# Patient Record
Sex: Male | Born: 1940
Health system: Southern US, Community
[De-identification: ages and names within clinical notes are randomized; demographics above are authoritative.]

## PROBLEM LIST (undated history)

## (undated) DIAGNOSIS — R350 Frequency of micturition: Secondary | ICD-10-CM

## (undated) DIAGNOSIS — H548 Legal blindness, as defined in USA: Secondary | ICD-10-CM

## (undated) DIAGNOSIS — M1990 Unspecified osteoarthritis, unspecified site: Secondary | ICD-10-CM

## (undated) DIAGNOSIS — Z860100 Personal history of colon polyps, unspecified: Secondary | ICD-10-CM

## (undated) DIAGNOSIS — M2550 Pain in unspecified joint: Secondary | ICD-10-CM

## (undated) DIAGNOSIS — M545 Low back pain, unspecified: Secondary | ICD-10-CM

## (undated) DIAGNOSIS — K219 Gastro-esophageal reflux disease without esophagitis: Secondary | ICD-10-CM

## (undated) DIAGNOSIS — J189 Pneumonia, unspecified organism: Secondary | ICD-10-CM

## (undated) DIAGNOSIS — G9001 Carotid sinus syncope: Secondary | ICD-10-CM

## (undated) DIAGNOSIS — N400 Enlarged prostate without lower urinary tract symptoms: Secondary | ICD-10-CM

## (undated) DIAGNOSIS — J45909 Unspecified asthma, uncomplicated: Secondary | ICD-10-CM

## (undated) DIAGNOSIS — K648 Other hemorrhoids: Secondary | ICD-10-CM

## (undated) DIAGNOSIS — R51 Headache: Secondary | ICD-10-CM

## (undated) DIAGNOSIS — I509 Heart failure, unspecified: Secondary | ICD-10-CM

## (undated) DIAGNOSIS — K649 Unspecified hemorrhoids: Secondary | ICD-10-CM

## (undated) DIAGNOSIS — E119 Type 2 diabetes mellitus without complications: Secondary | ICD-10-CM

## (undated) DIAGNOSIS — E785 Hyperlipidemia, unspecified: Secondary | ICD-10-CM

## (undated) DIAGNOSIS — Z8709 Personal history of other diseases of the respiratory system: Secondary | ICD-10-CM

## (undated) DIAGNOSIS — I10 Essential (primary) hypertension: Secondary | ICD-10-CM

## (undated) DIAGNOSIS — I2510 Atherosclerotic heart disease of native coronary artery without angina pectoris: Secondary | ICD-10-CM

## (undated) DIAGNOSIS — I6529 Occlusion and stenosis of unspecified carotid artery: Secondary | ICD-10-CM

## (undated) DIAGNOSIS — H269 Unspecified cataract: Secondary | ICD-10-CM

## (undated) HISTORY — DX: Hyperlipidemia, unspecified: E78.5

## (undated) HISTORY — DX: Occlusion and stenosis of unspecified carotid artery: I65.29

## (undated) HISTORY — PX: HERNIA REPAIR: SHX51

## (undated) HISTORY — PX: COLONOSCOPY: SHX174

## (undated) HISTORY — PX: OTHER SURGICAL HISTORY: SHX169

## (undated) HISTORY — DX: Benign prostatic hyperplasia without lower urinary tract symptoms: N40.0

## (undated) HISTORY — PX: EYE SURGERY: SHX253

## (undated) HISTORY — DX: Carotid sinus syncope: G90.01

## (undated) HISTORY — DX: Unspecified osteoarthritis, unspecified site: M19.90

## (undated) HISTORY — DX: Essential (primary) hypertension: I10

## (undated) HISTORY — DX: Legal blindness, as defined in USA: H54.8

## (undated) HISTORY — PX: RETINAL DETACHMENT SURGERY: SHX105

## (undated) HISTORY — DX: Gastro-esophageal reflux disease without esophagitis: K21.9

---

## 2002-04-29 ENCOUNTER — Ambulatory Visit (HOSPITAL_BASED_OUTPATIENT_CLINIC_OR_DEPARTMENT_OTHER): Admission: RE | Admit: 2002-04-29 | Discharge: 2002-04-29 | Payer: Self-pay | Admitting: Surgery

## 2006-02-14 ENCOUNTER — Encounter: Admission: RE | Admit: 2006-02-14 | Discharge: 2006-02-14 | Payer: Self-pay | Admitting: *Deleted

## 2006-02-14 IMAGING — CT CT HEAD WO/W CM
1 of 2 series · 13 of 30 positions shown, 17 images · IV contrast (omnipaque)
Comparison: None.

CLINICAL DATA: Bilateral hand numbness, possible TIA.
HEAD CT WITHOUT AND WITH CONTRAST:
TECHNIQUE: Contiguous axial images were obtained from the base of the skull through the vertex according to standard protocol before and after administration of intravenous contrast.
Contrast:  75 cc of Omnipaque 300 IV.

[Series 2: head · axial · 0.49mm/px · z∈[+31,+161]mm · 13 of 28 slices shown, 17 images]
[im 2/28  brain]
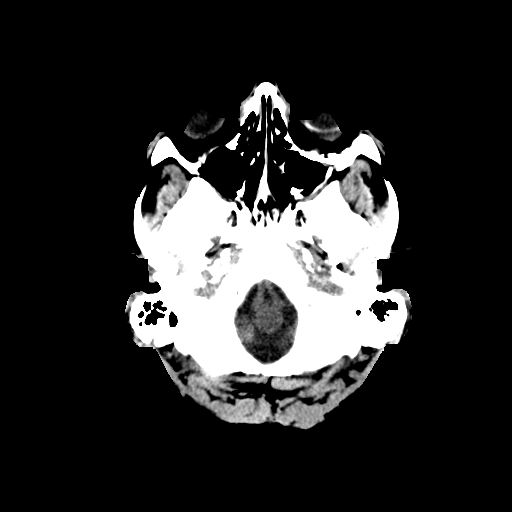
[im 2/28  bone]
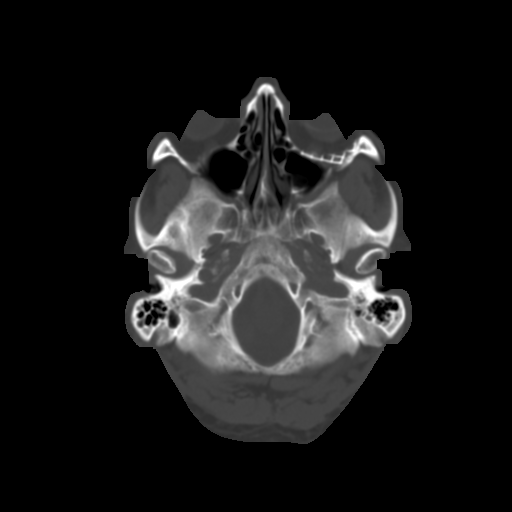
[im 4/28  brain]
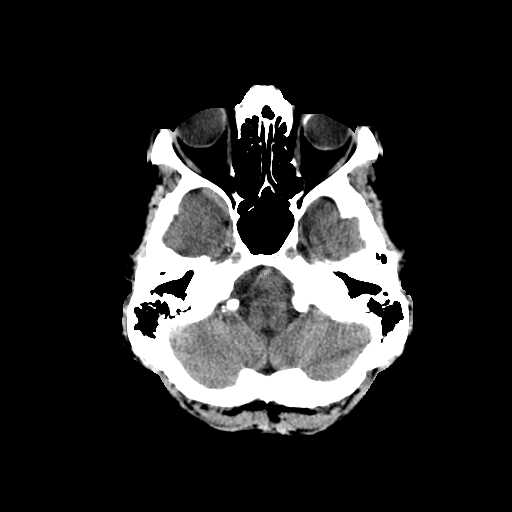
[im 6/28  brain]
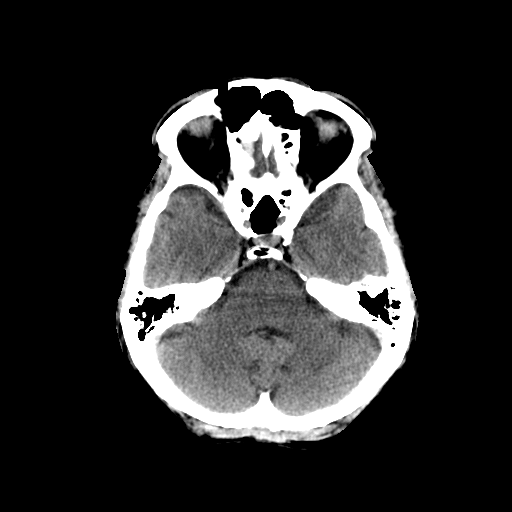
[im 8/28  brain]
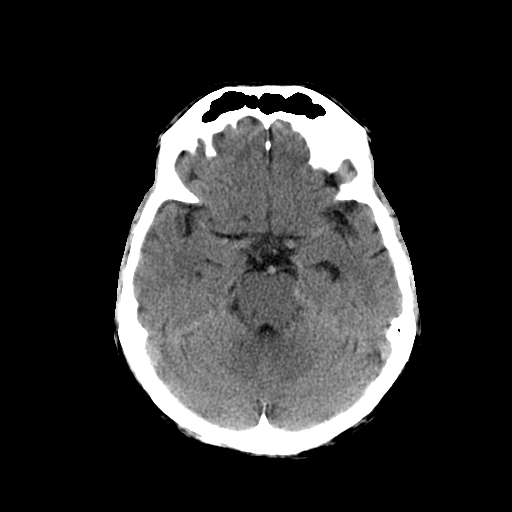
[im 10/28  brain]
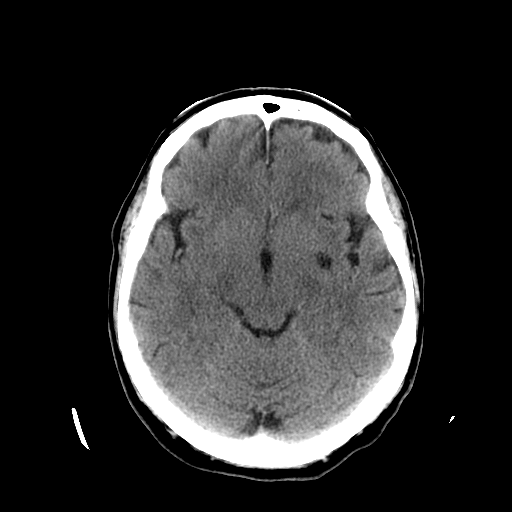
[im 10/28  bone]
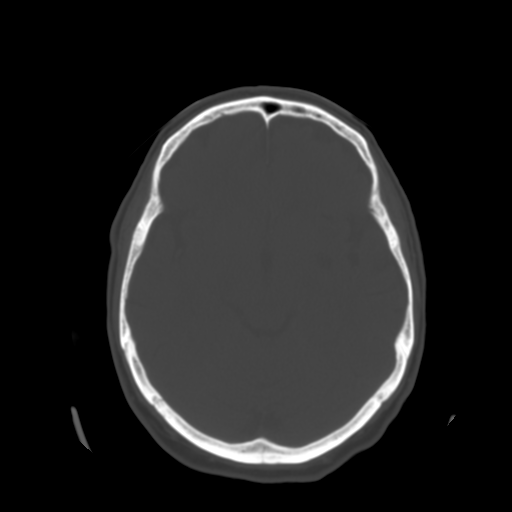
[im 12/28  brain]
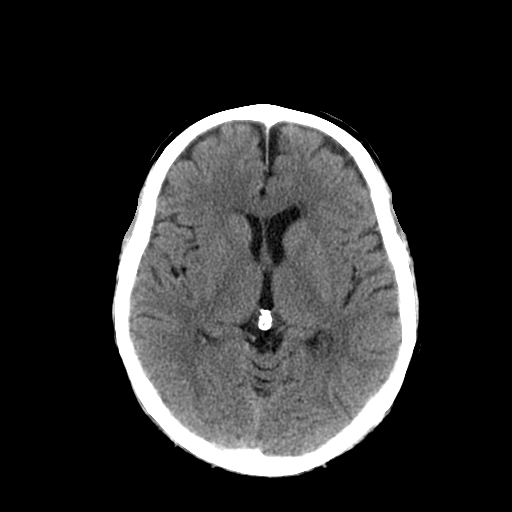
[im 14/28  brain]
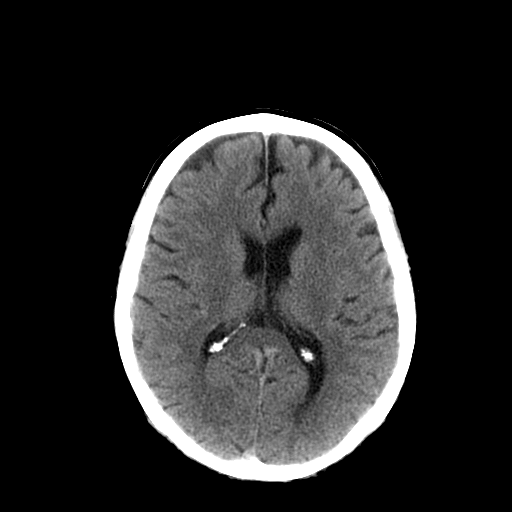
[im 16/28  brain]
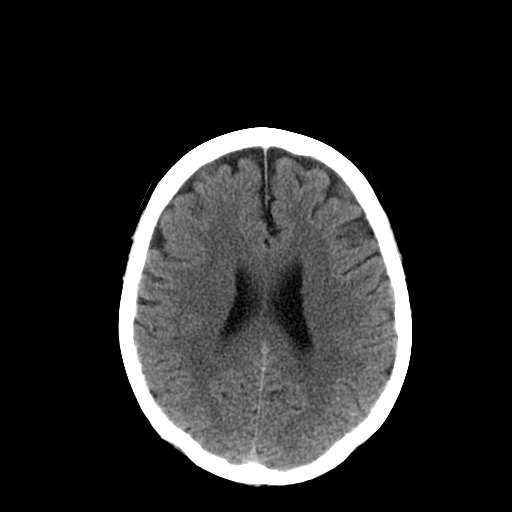
[im 18/28  brain]
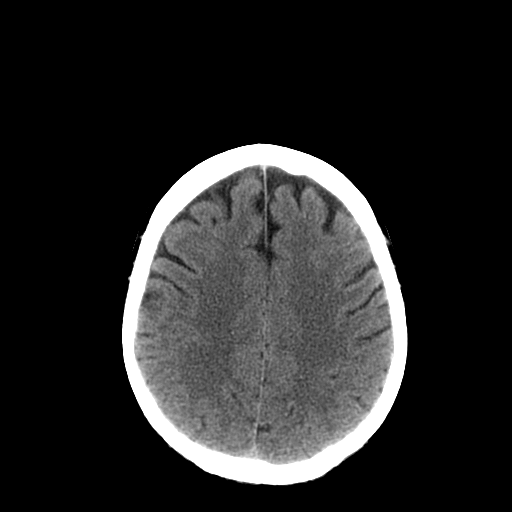
[im 18/28  bone]
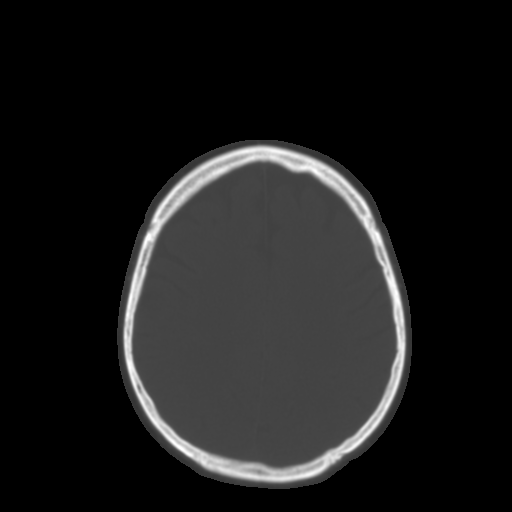
[im 20/28  brain]
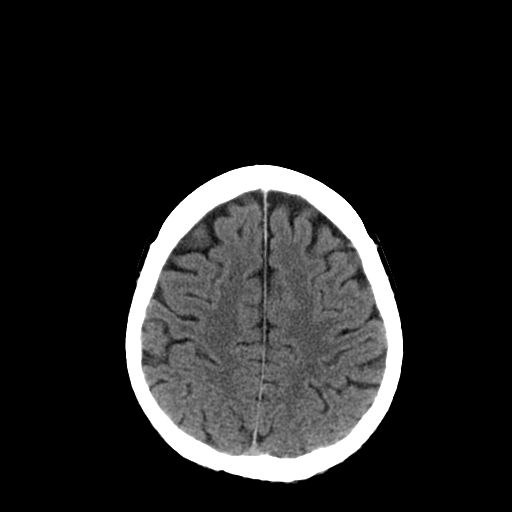
[im 22/28  brain]
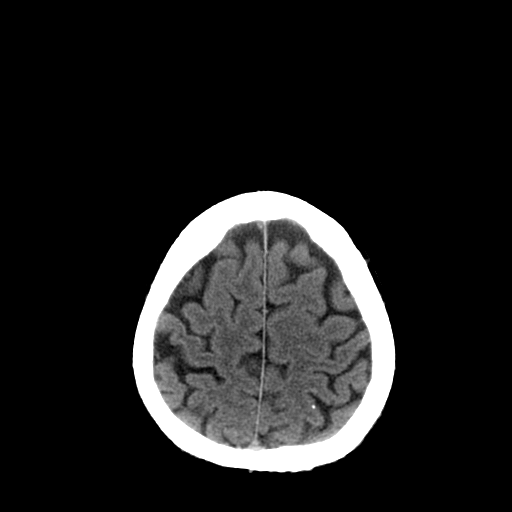
[im 24/28  brain]
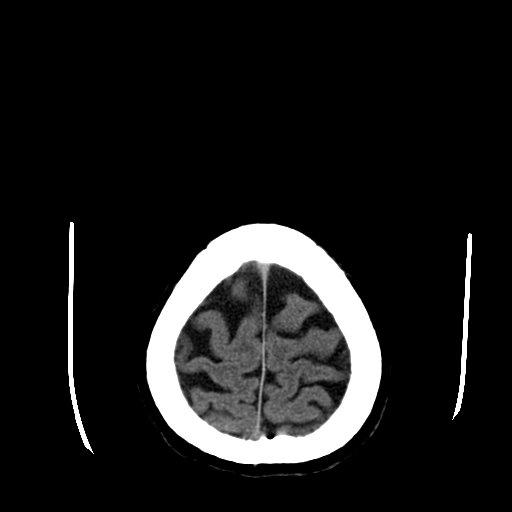
[im 26/28  brain]
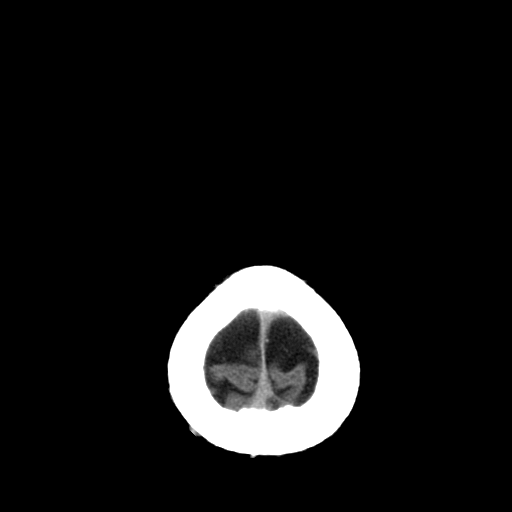
[im 26/28  bone]
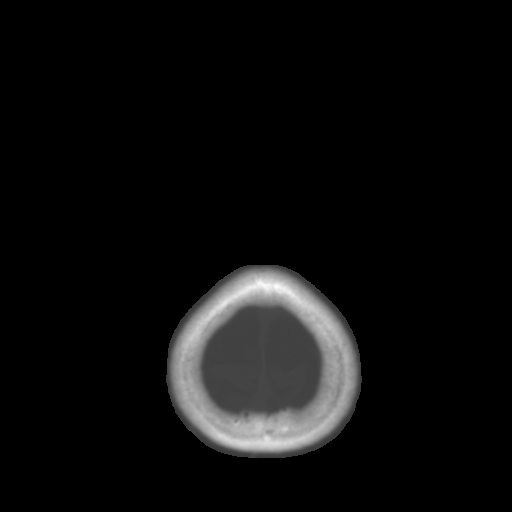

[13 of 30 positions shown; findings below may reference images not displayed]

FINDINGS: Mild age-appropriate atrophy (65 years old).  No significant white matter disease.  Prominent perivascular space in left medial temporal lobe.  No areas of acute infarction, hemorrhage, or mass effect.  Post infusion, no abnormal enhancement.  No hydrocephalus or extraaxial fluid collection.  No evidence for differential vascular enhancement.  Bone windows show paranasal sinuses and mastoids to be clear.  There has been a previous surgical procedure in the floor of the left orbit along with a left globe banding procedure.  Mild vascular calcification can be seen in the vertebral and carotid arteries.
IMPRESSION: No acute intracranial findings.

## 2006-02-14 IMAGING — US US CAROTID DUPLEX BILAT
1 series · 14 of 24 positions shown · non-contrast
Comparison: none

CLINICAL DATA: Bilateral hand numbness.  
 BILATERAL CAROTID DUPLEX ULTRASOUND:
 There is no previous for comparison.  There is partially calcified plaque effacing both carotid bulbs and extending into the proximal internal carotid arteries.  Appropriate wave forms throughout the extracranial carotid circulation.  Elevated peak systolic velocities in the proximal left external carotid artery.  Antegrade flow in both vertebral arteries.  
 The following Doppler flow velocity measurements were obtained (in cm/sec):
 SITE:  PEAK SYSTOLIC  END DIASTOLIC
 RIGHT ICA:   106  29
 RIGHT CCA:  102  16
 RIGHT ICA/CCA RATIO:
 RIGHT ECA:  118
 LEFT ICA:  147  30
 LEFT CCA:  120  22
 LEFT ICA/CCA RATIO:
 LEFT ECA:  211
 Criteria:  Quantification of carotid stenosis is based on velocity parameters that correlate the residual internal carotid diameter with NASCET ? based stenosis levels.

[Series 1: unknown · 0.07mm/px · 14 of 86 slices shown]
[im 1/86]
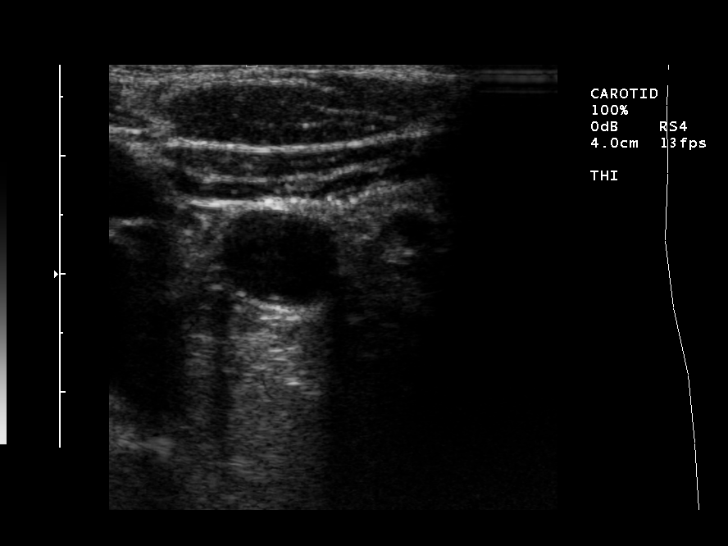
[im 8/86]
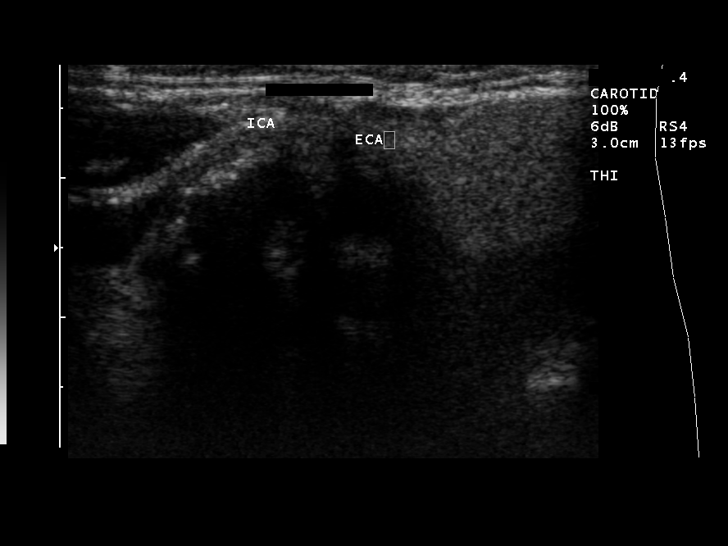
[im 15/86]
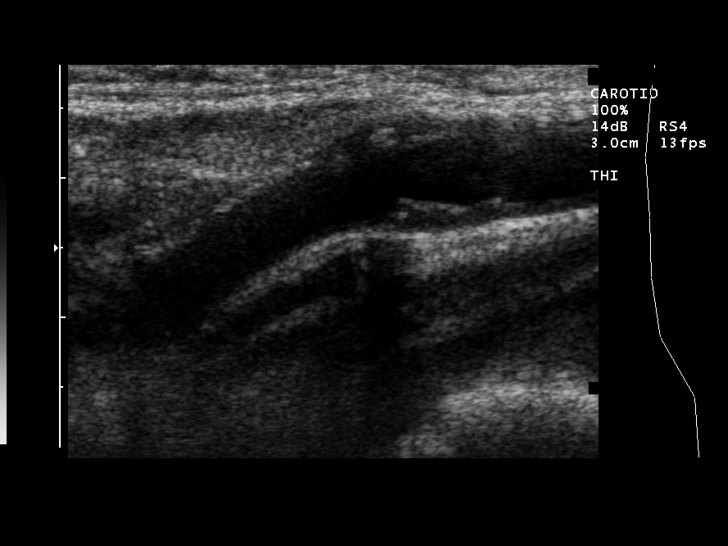
[im 23/86]
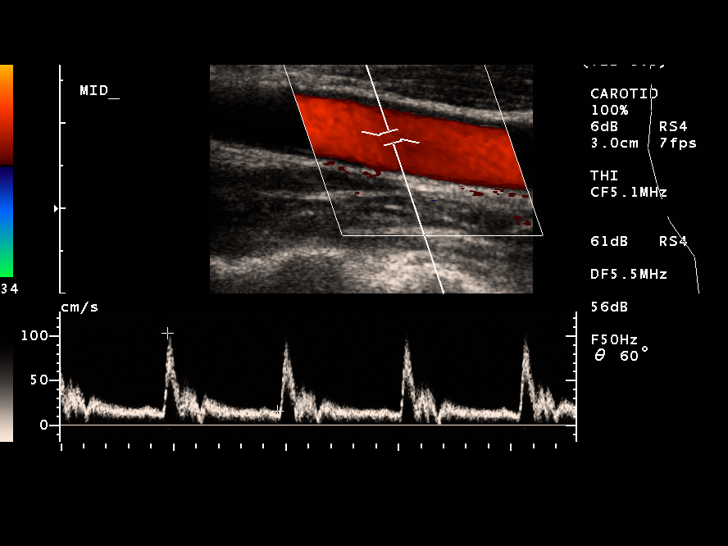
[im 26/86]
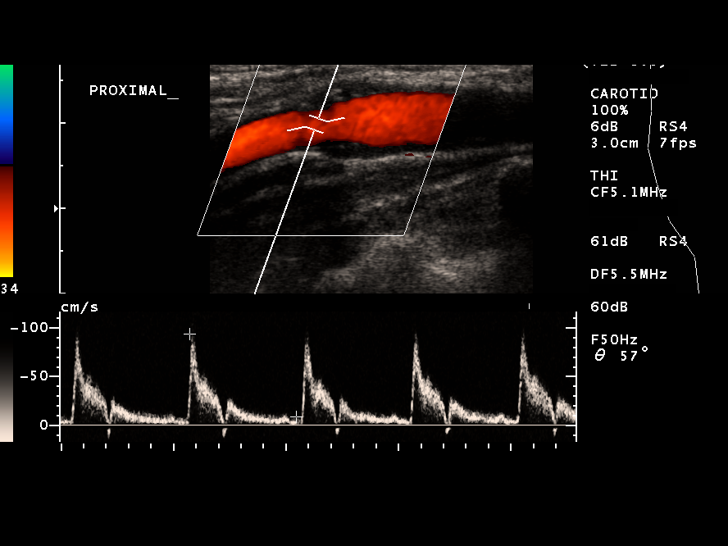
[im 34/86]
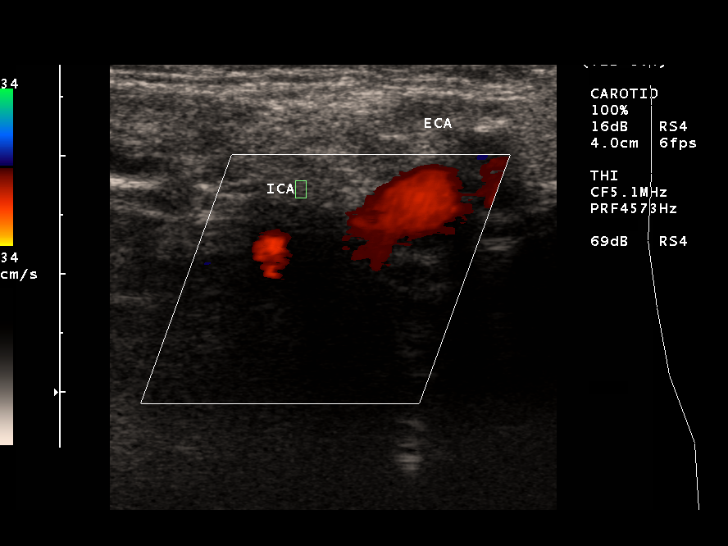
[im 41/86]
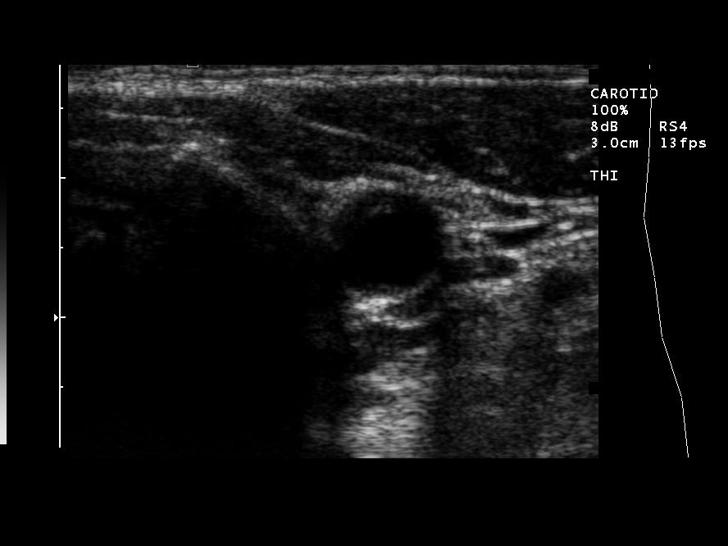
[im 45/86]
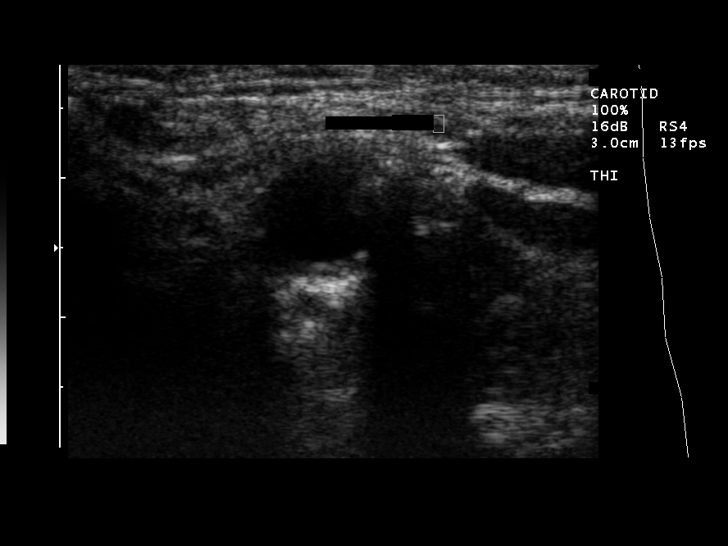
[im 52/86]
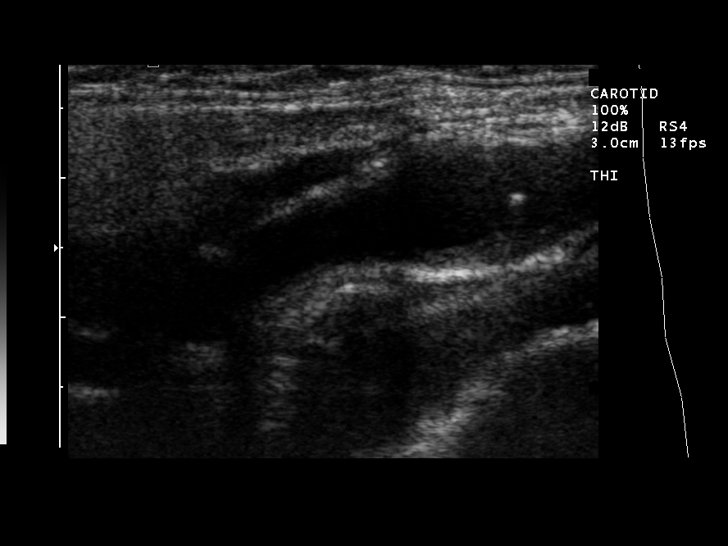
[im 60/86]
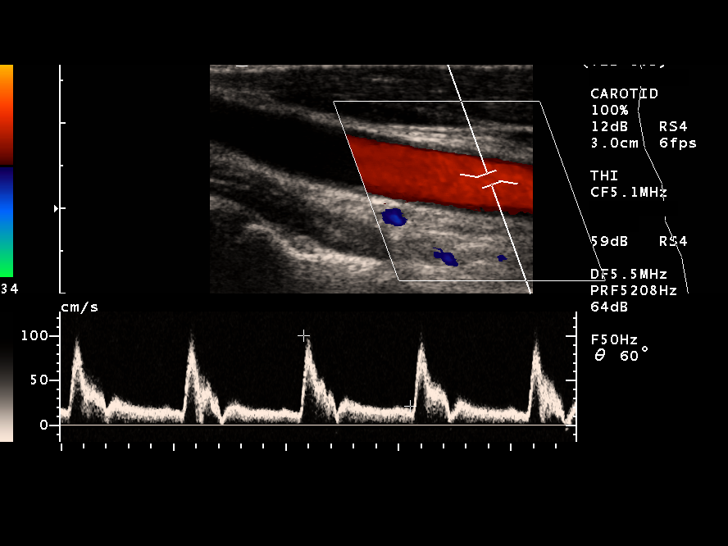
[im 67/86]
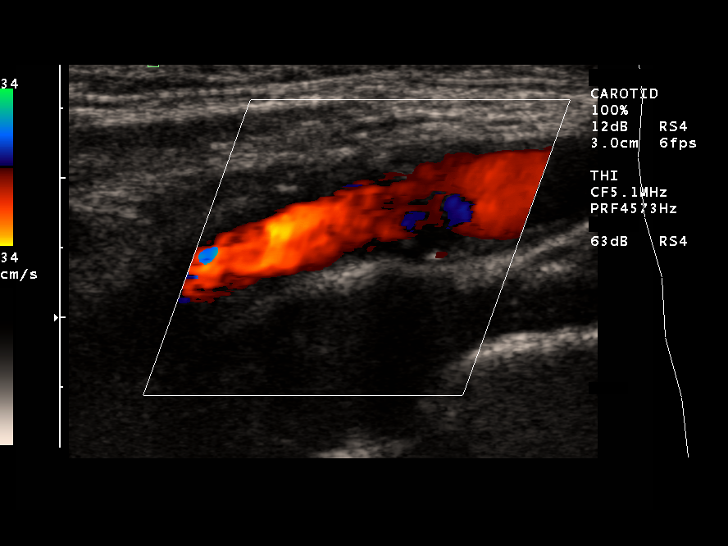
[im 71/86]
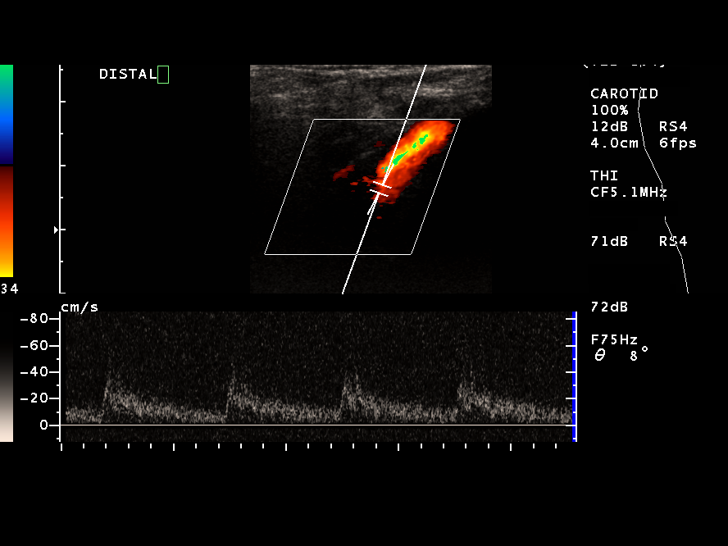
[im 78/86]
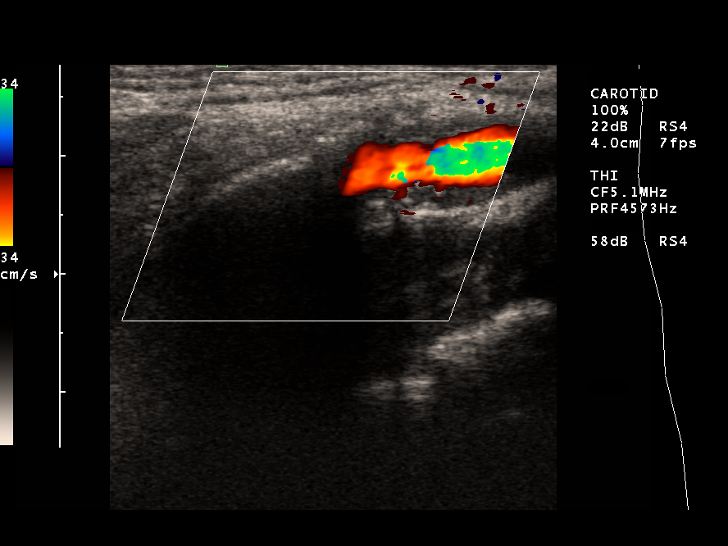
[im 86/86]
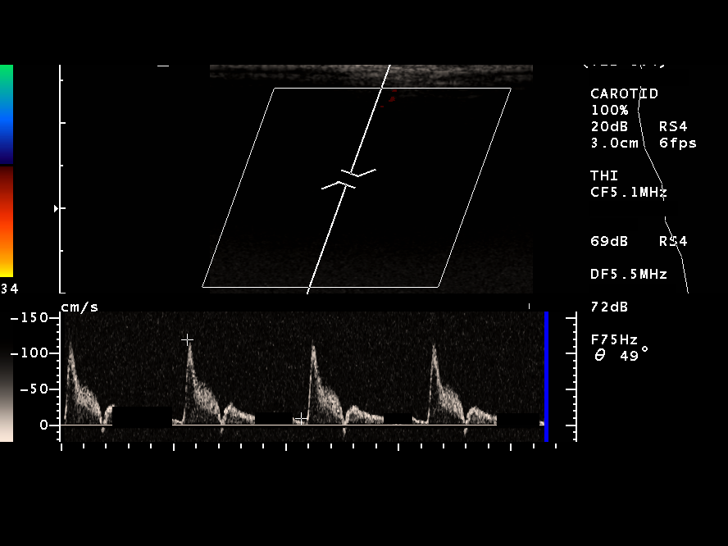

[14 of 24 positions shown; findings below may reference images not displayed]

IMPRESSION: 1.  Bilateral heavy carotid bifurcation and proximal ICA plaque with less than 50% diameter stenosis.  The exam does not exclude plaque ulceration or embolization.  Continued surveillance recommended.

## 2013-03-19 ENCOUNTER — Other Ambulatory Visit: Payer: Self-pay | Admitting: Family Medicine

## 2013-03-19 DIAGNOSIS — G9001 Carotid sinus syncope: Secondary | ICD-10-CM

## 2013-03-22 ENCOUNTER — Ambulatory Visit
Admission: RE | Admit: 2013-03-22 | Discharge: 2013-03-22 | Disposition: A | Discharge status: Home / Self Care | Payer: Medicare Other | Source: Ambulatory Visit | Attending: Family Medicine | Admitting: Family Medicine

## 2013-03-22 DIAGNOSIS — G9001 Carotid sinus syncope: Secondary | ICD-10-CM

## 2013-03-22 IMAGING — US US CAROTID DUPLEX BILAT
1 series · 13 of 24 positions shown · non-contrast
Comparison: US DUPLEX EXTRACRANIAL ARTERY BILAT dated [DATE]; CT
HEAD WO/W CM dated [DATE]

CLINICAL DATA: Carotidynia, hypertension, hyperlipidemia

EXAM:
BILATERAL CAROTID DUPLEX ULTRASOUND
TECHNIQUE: Gray scale imaging, color Doppler and duplex ultrasound were
performed of bilateral carotid and vertebral arteries in the neck.

[Series 1: us carotid duplex bilat · 0.08mm/px · 13 of 74 slices shown]
[im 1/74]
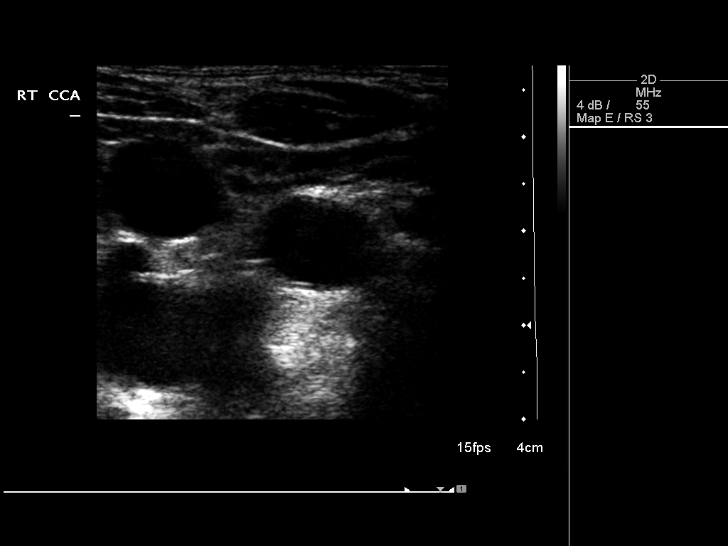
[im 7/74]
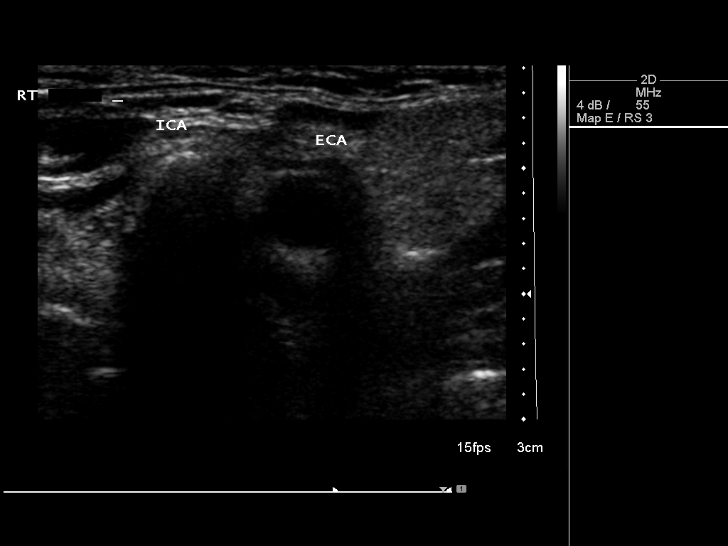
[im 13/74]
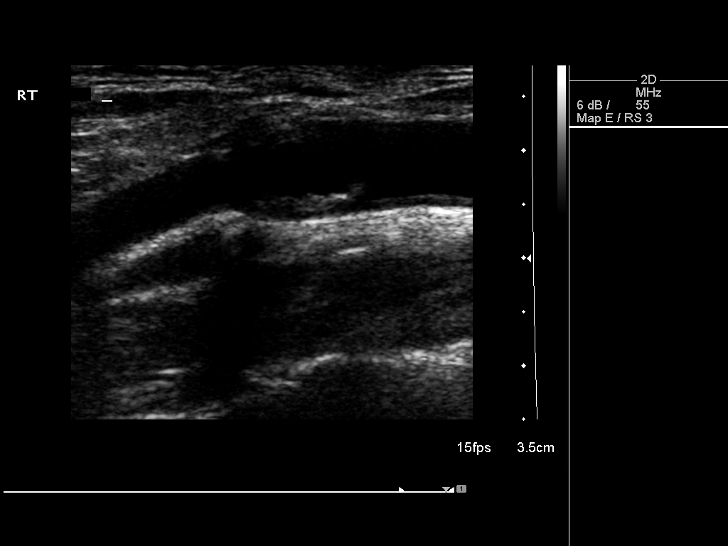
[im 20/74]
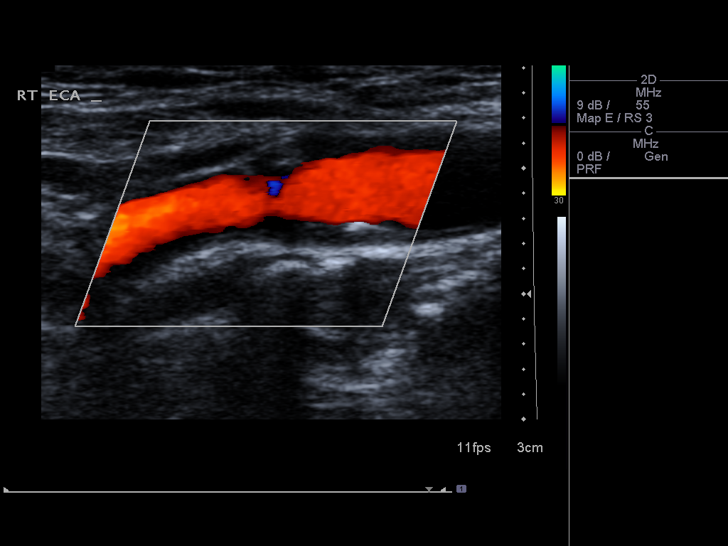
[im 26/74]
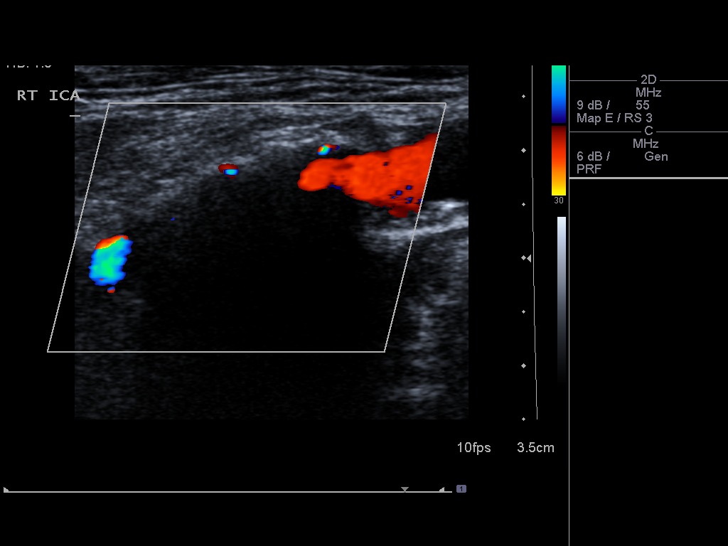
[im 32/74]
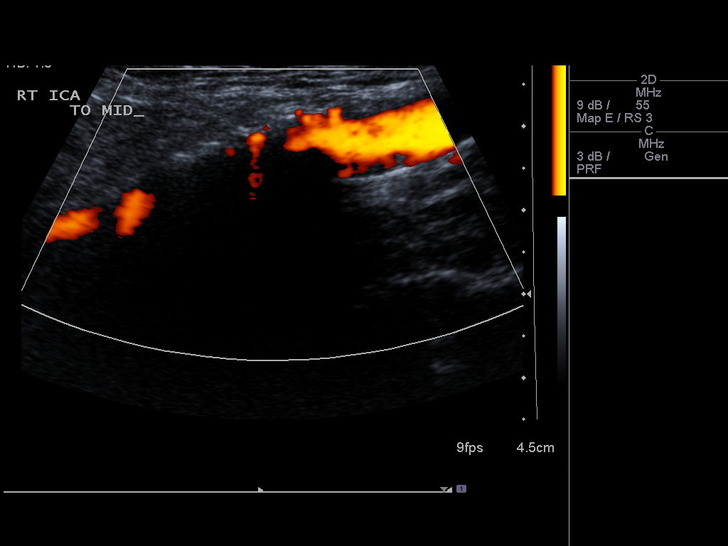
[im 39/74]
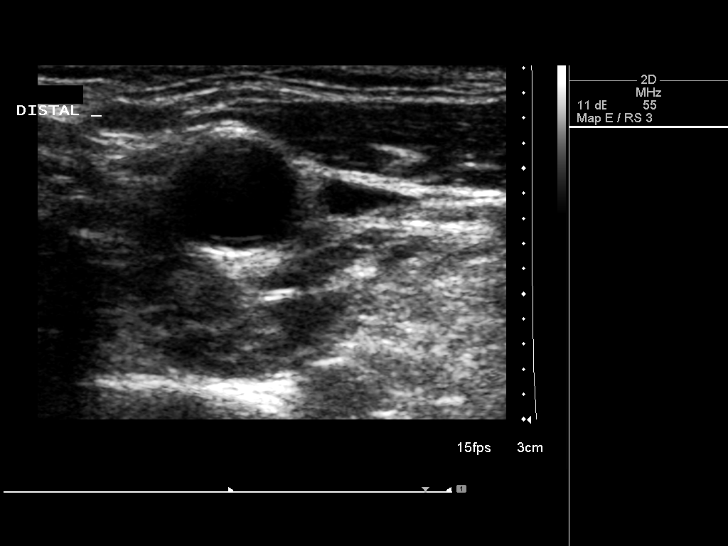
[im 42/74]
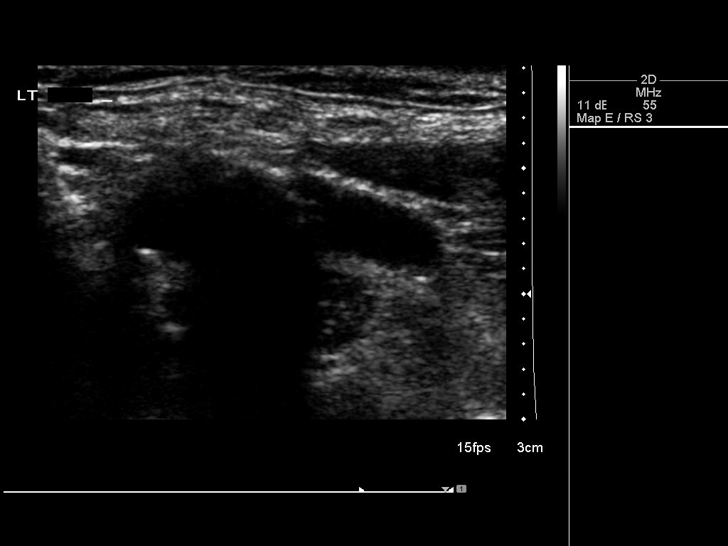
[im 48/74]
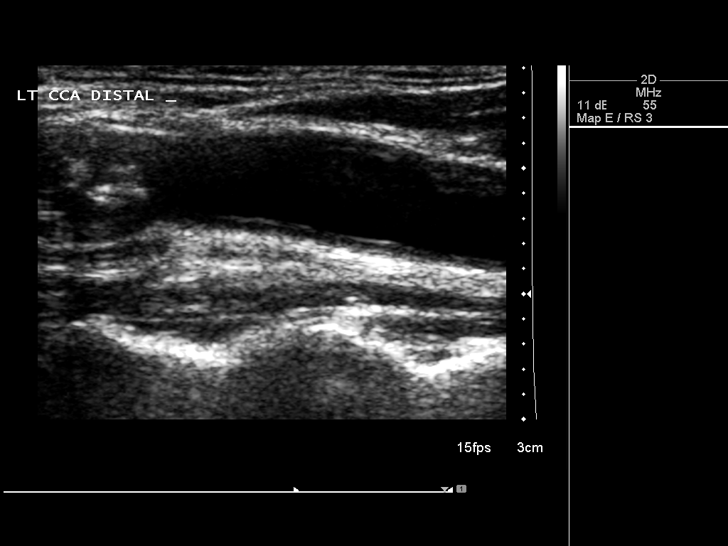
[im 54/74]
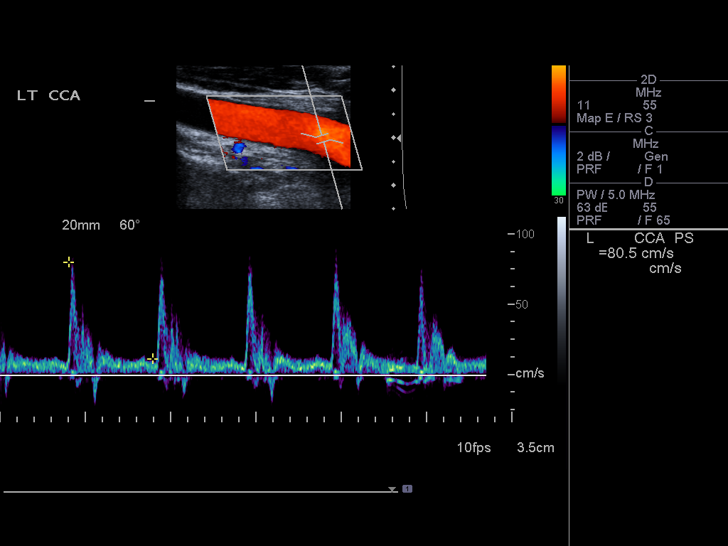
[im 61/74]
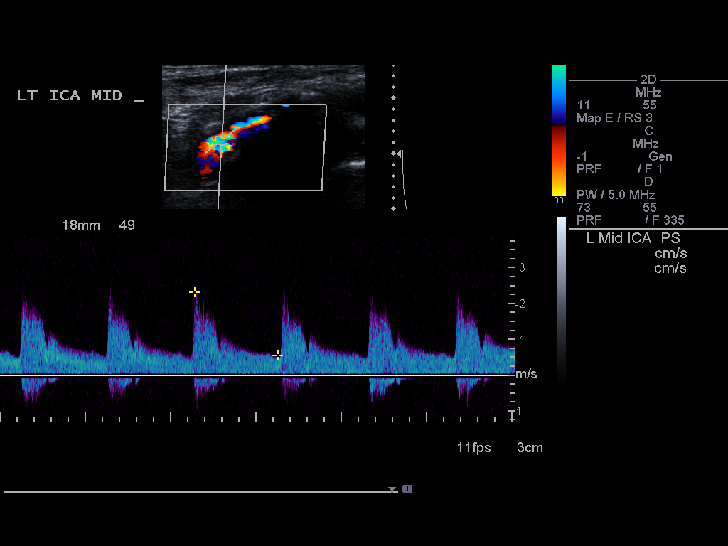
[im 67/74]
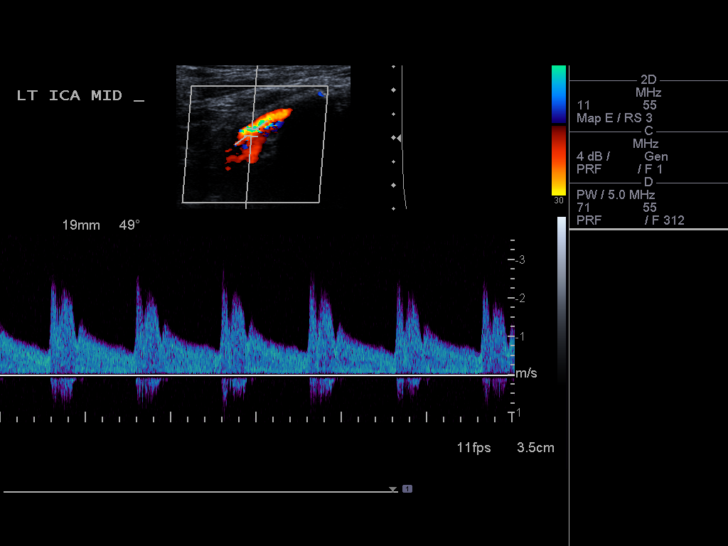
[im 74/74]
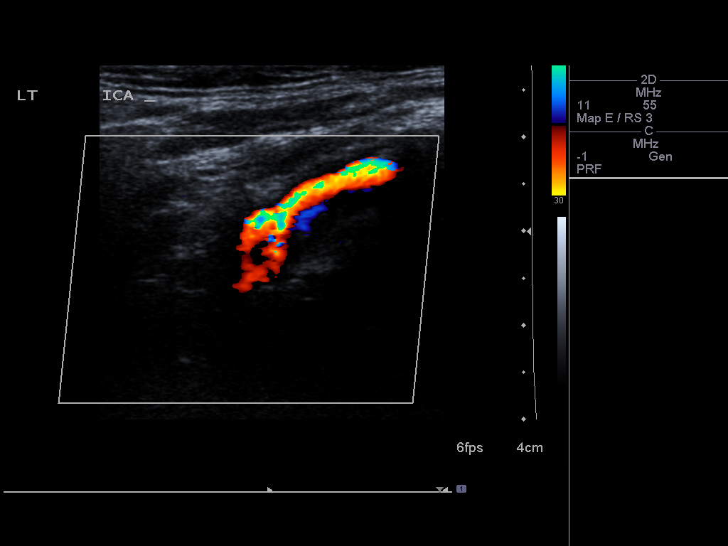

[13 of 24 positions shown; findings below may reference images not displayed]

FINDINGS: Criteria: Quantification of carotid stenosis is based on velocity
parameters that correlate the residual internal carotid diameter
with NASCET-based stenosis levels, using the diameter of the distal
internal carotid lumen as the denominator for stenosis measurement.

The following velocity measurements were obtained:

RIGHT

ICA:  122/39 cm/sec

CCA:  97/18 cm/sec

SYSTOLIC ICA/CCA RATIO:

DIASTOLIC ICA/CCA RATIO:

ECA:  133 cm/sec

LEFT

ICA:  242/70 cm/sec

CCA:  113/16 cm/sec

SYSTOLIC ICA/CCA RATIO:

DIASTOLIC ICA/CCA RATIO:

ECA:  179 cm/sec

RIGHT CAROTID ARTERY: There is a minimal amount of eccentric
echogenic partially shadowing plaque within the mid aspect of the
right common carotid artery (images 3 and 11). There is eccentric
mixed echogenic partially shadowing plaque within the left carotid
bulb (images 5, 6 and 14). There is echogenic densely shadowing
plaque involving the origin an proximal aspects of the right
internal carotid artery (image 17) resulting in borderline elevated
peak systolic velocities within the distal aspect of the right
internal carotid artery (measuring 122 cm/sec - image 30;
previously, 106 cm/sec).

RIGHT VERTEBRAL ARTERY:  Antegrade flow

LEFT CAROTID ARTERY: There is eccentric mixed echogenic plaque
within the left carotid bulb (images 42 and 43 and 51. There is
eccentric echogenic densely shadowing plaque involving the origin
and proximal aspect of the left internal carotid artery (images 55
and 62), resulting in elevated peak systolic velocities throughout
the interrogated course of the left internal carotid artery,
greatest proximally (measuring 242 cm/sec - image 62; previously,
150 cm/sec).

LEFT VERTEBRAL ARTERY:  Antegrade flow
IMPRESSION: 1. Densely shadowing left-sided atherosclerotic plaque now results
in elevated peak systolic velocities within the left internal
carotid artery compatible with the greater than 70% luminal
narrowing range. Further evaluation with CTA could be performed as
clinically indicated.
2. Densely shadowing right-sided atherosclerotic plaque now results
in borderline elevated peak systolic velocities within the right
internal carotid artery which approaches the 50% luminal narrowing
range.
These results will be called to the ordering clinician or
representative by the Radiologist Assistant, and communication
documented in the PACS Dashboard.

## 2013-03-29 ENCOUNTER — Encounter: Payer: Self-pay | Admitting: *Deleted

## 2013-03-29 ENCOUNTER — Encounter: Payer: Self-pay | Admitting: Vascular Surgery

## 2013-03-29 ENCOUNTER — Other Ambulatory Visit: Payer: Self-pay | Admitting: *Deleted

## 2013-03-29 DIAGNOSIS — Z01810 Encounter for preprocedural cardiovascular examination: Secondary | ICD-10-CM

## 2013-03-29 DIAGNOSIS — I6529 Occlusion and stenosis of unspecified carotid artery: Secondary | ICD-10-CM

## 2013-04-10 ENCOUNTER — Encounter: Payer: Self-pay | Admitting: Vascular Surgery

## 2013-04-11 ENCOUNTER — Encounter (INDEPENDENT_AMBULATORY_CARE_PROVIDER_SITE_OTHER): Payer: Self-pay

## 2013-04-11 ENCOUNTER — Ambulatory Visit (INDEPENDENT_AMBULATORY_CARE_PROVIDER_SITE_OTHER): Payer: Medicare Other | Admitting: Vascular Surgery

## 2013-04-11 ENCOUNTER — Ambulatory Visit (HOSPITAL_COMMUNITY)
Admission: RE | Admit: 2013-04-11 | Discharge: 2013-04-11 | Disposition: A | Discharge status: Home / Self Care | Payer: Medicare Other | Source: Ambulatory Visit | Attending: Vascular Surgery | Admitting: Vascular Surgery

## 2013-04-11 ENCOUNTER — Encounter: Payer: Self-pay | Admitting: Vascular Surgery

## 2013-04-11 VITALS — Resp 16 | Ht 66.0 in | Wt 173.6 lb

## 2013-04-11 DIAGNOSIS — Z01810 Encounter for preprocedural cardiovascular examination: Secondary | ICD-10-CM

## 2013-04-11 DIAGNOSIS — I6529 Occlusion and stenosis of unspecified carotid artery: Secondary | ICD-10-CM | POA: Insufficient documentation

## 2013-04-11 NOTE — Progress Notes (Signed)
[facility] [place] HISTORY AND PHYSICAL   History of Present Illness:  Patient is a 73 y.o. year old male who presents for evaluation of asymptomatic carotid stenosis. The carotid stenosis was found on duplex ultrasound investigation for left neck pain. The patient denies any symptoms of TIA amaurosis or stroke. He apparently had some right arm weakness and 2008 but this was thought to be more to peripheral nerve impingement. He currently takes one aspirin daily. He did take a statin in the past but this was stopped for pain symptoms. He is a former smoker and quit 50 years ago.  Other medical problems include hyperlipidemia, hypertension, arthritis all of which are currently stable. 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  Past Medical History  Diagnosis Date  . GERD (gastroesophageal reflux disease)   . Hyperlipidemia   . Hypertension   . BPH (benign prostatic hyperplasia)   . Arthritis   . Carotid artery occlusion     right ICA 50% stenosis  . Legally blind in left eye, as defined in [place]     hit by a baseball  . Carotodynia     Past Surgical History  Procedure Laterality Date  . Hernia repair    . Retinal laser surgery      Social History History  Substance Use Topics  . Smoking status: Never Smoker   . Smokeless tobacco: Never Used  . Alcohol Use: No    Family History Family History  Problem Relation Age of Onset  . Adopted: Yes    Allergies  Allergies  Allergen Reactions  . Atorvastatin     Severe myalgias  . Darvon [Propoxyphene] Other (See Comments)    hallucinate  . Indomethacin     Neck pain flare  . Penicillins   . Pravastatin     myalgias  . Sulfa Antibiotics      Current Outpatient Prescriptions  Medication Sig Dispense Refill  . aspirin 81 MG tablet Take 81 mg by mouth daily.      Marland Kitchen doxazosin (CARDURA XL) 8 MG 24 hr tablet Take 8 mg by mouth  every evening.      Marland Kitchen KRILL OIL PO Take by mouth 2 (two) times daily.      Marland Kitchen lisinopril (PRINIVIL,ZESTRIL) 30 MG tablet Take 30 mg by mouth 2 (two) times daily.       Marland Kitchen omeprazole (PRILOSEC) 40 MG capsule Take 40 mg by mouth daily.      . traMADol (ULTRAM) 50 MG tablet Take 50 mg by mouth 2 (two) times daily as needed.      . TURMERIC PO Take by mouth 2 (two) times daily.      . verapamil (CALAN) 120 MG tablet Take 120 mg by mouth daily with supper.      . verapamil (COVERA HS) 240 MG (CO) 24 hr tablet Take 240 mg by mouth every morning.      Marland Kitchen atorvastatin (LIPITOR) 10 MG tablet Take 10 mg by mouth every evening.      Marland Kitchen doxazosin (CARDURA) 8 MG tablet Take 8 mg by mouth daily.       No current facility-administered medications for this visit.    ROS:   General:  No weight loss, Fever, chills  HEENT: No recent headaches, no nasal bleeding, no visual changes, no sore throat  Neurologic: No dizziness, blackouts, seizures. No recent symptoms of stroke  or mini- stroke. No recent episodes of slurred speech, or temporary blindness.  Cardiac: No recent episodes of chest pain/pressure, no shortness of breath at rest.  No shortness of breath with exertion.  Denies history of atrial fibrillation or irregular heartbeat  Vascular: No history of rest pain in feet.  No history of claudication.  No history of non-healing ulcer, No history of DVT   Pulmonary: No home oxygen, no productive cough, no hemoptysis,  No asthma or wheezing  Musculoskeletal:  [x ] Arthritis, [ ]  Low back pain,  [ ]  Joint pain  Hematologic:No history of hypercoagulable state.  No history of easy bleeding.  No history of anemia  Gastrointestinal: No hematochezia or melena,  No gastroesophageal reflux, no trouble swallowing  Urinary: [ ]  chronic Kidney disease, [ ]  on HD - [ ]  MWF or [ ]  TTHS, [ ]  Burning with urination, [ ]  Frequent urination, [ ]  Difficulty urinating;   Skin: No rashes  Psychological: No history of  anxiety,  No history of depression   Physical Examination  Filed Vitals:   04/11/13 1522  Resp: 16  Height: 5\' 6"  (1.676 m)  Weight: 173 lb 9.6 oz (78.744 kg)    Body mass index is 28.03 kg/(m^2).  General:  Alert and oriented, no acute distress HEENT: Normal Neck: No bruit or JVD Pulmonary: Clear to auscultation bilaterally Cardiac: Regular Rate and Rhythm without murmur Abdomen: Soft, non-tender, non-distended, no mass Skin: No rash Extremity Pulses:  2+ radial, brachial, femoral pulses bilaterally Musculoskeletal: No deformity or edema  Neurologic: Upper and lower extremity motor 5/5 and symmetric  DATA:  Carotid duplex scan dated 3 [facility] was reviewed. This shows greater than 70% left internal carotid artery stenosis with dense calcification 50% stenosis of the right side antegrade vertebral flow. We repeated the left side carotid duplex today for operative planning purposes. This again shows a 60-80% stenosis of the left side but severe calcification probably underestimates the level of stenosis. I reviewed and interpreted this study.    ASSESSMENT:  Asymptomatic high-grade left internal carotid artery stenosis   PLAN:  We will schedule an evaluation for cardiac risk stratification. The patient states he is taking a trip March 20-26. He wishes to defer left carotid endarterectomy to after this. This will be scheduled for 05/08/2013. Risks benefits possible complications and procedure details were explained to the patient and his wife today including but not limited to stroke risk of 1% cranial nerve injury risk of approximately 5-10% small risk of wound problems such as hematoma/ infection.  Fabienne Brunsharles Mayda Shippee, MD [facility] [place] Office: [phone] Pager: [phone]

## 2013-04-16 ENCOUNTER — Telehealth: Payer: Self-pay | Admitting: Vascular Surgery

## 2013-04-16 NOTE — Telephone Encounter (Signed)
Notified patient of change in appointment for pre-op cardiac clearance.

## 2013-04-17 ENCOUNTER — Ambulatory Visit (INDEPENDENT_AMBULATORY_CARE_PROVIDER_SITE_OTHER): Payer: Medicare Other | Admitting: Cardiovascular Disease

## 2013-04-17 ENCOUNTER — Encounter: Payer: Self-pay | Admitting: Cardiovascular Disease

## 2013-04-17 VITALS — BP 128/78 | HR 76 | Ht 65.0 in | Wt 173.5 lb

## 2013-04-17 DIAGNOSIS — Z01810 Encounter for preprocedural cardiovascular examination: Secondary | ICD-10-CM

## 2013-04-17 DIAGNOSIS — Z8249 Family history of ischemic heart disease and other diseases of the circulatory system: Secondary | ICD-10-CM | POA: Insufficient documentation

## 2013-04-17 DIAGNOSIS — I779 Disorder of arteries and arterioles, unspecified: Secondary | ICD-10-CM

## 2013-04-17 DIAGNOSIS — E785 Hyperlipidemia, unspecified: Secondary | ICD-10-CM

## 2013-04-17 DIAGNOSIS — I739 Peripheral vascular disease, unspecified: Secondary | ICD-10-CM

## 2013-04-17 DIAGNOSIS — I10 Essential (primary) hypertension: Secondary | ICD-10-CM

## 2013-04-17 NOTE — Patient Instructions (Signed)
Your physician has requested that you have en exercise stress myoview. For further information please visit [email or web address]. Please follow instruction sheet, as given. We will call you with the results of this test.    Your physician wants you to follow-up in: 1 year with Dr. Allyson SabalBerry. You will receive a reminder letter in the mail two months in advance. If you don't receive a letter, please call our office to schedule the follow-up appointment.

## 2013-04-17 NOTE — Assessment & Plan Note (Signed)
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 Leonette Mostharles fields for cardiovascular evaluation/preoperative clearance before elective left carotid endarterectomy. He does have positive cardiovascular factors including family history, hypertension and hyperlipidemia. He has never had a heart attack or stroke. He had incidentally noted moderate left internal carotid artery stenosis by duplex ultrasound. He was seen by Dr. Fabienne Brunsharles Fields for vascular surgical evaluation, and he was planning elective left carotid endarterectomy . The patient was sent here for further evaluation. His exam is completely benign. Based on the propensity of his cardiovascular risk factors I'm going to get an exercise Myoview stress test to risk stratify him for his vascular surgical procedure

## 2013-04-17 NOTE — Assessment & Plan Note (Signed)
On statin therapy followed by his PCP 

## 2013-04-17 NOTE — Progress Notes (Signed)
04/17/2013 David Sellers   [birth year]  [id number]  Primary Physician Mickie HillierLITTLE,KEVIN LORNE, MD Primary Cardiologist: Runell GessJonathan J. Willia Genrich MD Roseanne RenoFACP,FACC,FAHA, FSCAI   HPI:  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 Fabienne Brunsharles Fields for cardiovascular evaluation and preoperative screening prior to elective left carotid endarterectomy. His cardiovascular factor profile is positive for hypertension, dyslipidemia, and family history of heart disease with a father who died of a myocardial infarction in his 4660s. He has never had a heart attack or stroke. He denies chest pain or shortness of breath. No neurologic symptoms. He did have pain in his left neck and had incidentally found moderate left internal carotid artery stenosis.   Current Outpatient Prescriptions  Medication Sig Dispense Refill  . Acetaminophen (TYLENOL ARTHRITIS PAIN PO) Take 2 tablets by mouth as needed (daily as needed for pain).      Marland Kitchen. aspirin 81 MG tablet Take 81 mg by mouth daily.      Marland Kitchen. atorvastatin (LIPITOR) 10 MG tablet Take 10 mg by mouth every evening.      Marland Kitchen. doxazosin (CARDURA) 8 MG tablet Take 8 mg by mouth daily.      Marland Kitchen. KRILL OIL PO Take by mouth 2 (two) times daily.      Marland Kitchen. lisinopril (PRINIVIL,ZESTRIL) 30 MG tablet Take 30 mg by mouth 2 (two) times daily.       Marland Kitchen. omeprazole (PRILOSEC) 40 MG capsule Take 40 mg by mouth daily.      . traMADol (ULTRAM) 50 MG tablet Take 50 mg by mouth 2 (two) times daily as needed.      . TURMERIC PO Take by mouth 2 (two) times daily.      . verapamil (COVERA HS) 240 MG (CO) 24 hr tablet Take 240 mg by mouth every morning.       No current facility-administered medications for this visit.    Allergies  Allergen Reactions  . Atorvastatin     Severe myalgias  . Darvon [Propoxyphene] Other (See Comments)    hallucinate  . Indomethacin     Neck pain flare  . Penicillins   .  Pravastatin     myalgias  . Sulfa Antibiotics     History   Social History  . Marital Status: Married    Spouse Name: N/A    Number of Children: N/A  . Years of Education: N/A   Occupational History  . Not on file.   Social History Main Topics  . Smoking status: Never Smoker   . Smokeless tobacco: Never Used  . Alcohol Use: No  . Drug Use: No  . Sexual Activity: Not on file   Other Topics Concern  . Not on file   Social History Narrative  . No narrative on file     Review of Systems: General: negative for chills, fever, night sweats or weight changes.  Cardiovascular: negative for chest pain, dyspnea on exertion, edema, orthopnea, palpitations, paroxysmal nocturnal dyspnea or shortness of breath Dermatological: negative for rash Respiratory: negative for cough or wheezing Urologic: negative for hematuria Abdominal: negative for nausea, vomiting, diarrhea, bright red blood per rectum, melena, or hematemesis Neurologic: negative for visual changes, syncope, or dizziness All other systems reviewed and are otherwise negative except as noted above.    Blood pressure 128/78, pulse 76, height 5\' 5"  (1.651 m), weight 173 lb 8 oz (78.699 kg).  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 He does have positive cardiovascular factors including family history, hypertension and hyperlipidemia. He has never had a heart attack or stroke. He had incidentally noted moderate left internal carotid artery stenosis by duplex ultrasound. He was seen by Dr. Fabienne Bruns for vascular surgical evaluation, and he was planning elective left carotid endarterectomy . The patient was sent here for further evaluation. His exam is completely benign. Based on the propensity of his cardiovascular risk factors I'm going to get an exercise Myoview stress test to risk stratify him for his vascular surgical procedure  Essential hypertension Controlled on current medications  Hyperlipidemia On statin therapy followed by his PCP      Runell Gess MD [facility], [facility] 04/17/2013 9:31 AM

## 2013-04-17 NOTE — Assessment & Plan Note (Signed)
Controlled on current medications 

## 2013-04-18 ENCOUNTER — Telehealth (HOSPITAL_COMMUNITY): Payer: Self-pay

## 2013-04-23 ENCOUNTER — Ambulatory Visit (HOSPITAL_COMMUNITY)
Admission: RE | Admit: 2013-04-23 | Discharge: 2013-04-23 | Disposition: A | Discharge status: Home / Self Care | Payer: Medicare Other | Source: Ambulatory Visit | Attending: Cardiovascular Disease | Admitting: Cardiovascular Disease

## 2013-04-23 DIAGNOSIS — I10 Essential (primary) hypertension: Secondary | ICD-10-CM

## 2013-04-23 DIAGNOSIS — Z01810 Encounter for preprocedural cardiovascular examination: Secondary | ICD-10-CM

## 2013-04-23 DIAGNOSIS — E785 Hyperlipidemia, unspecified: Secondary | ICD-10-CM

## 2013-04-23 MED ORDER — AMINOPHYLLINE 25 MG/ML IV SOLN
75.0000 mg | Freq: Once | INTRAVENOUS | Status: AC
  Administered 2013-04-23: 75 mg via INTRAVENOUS

## 2013-04-23 MED ORDER — TECHNETIUM TC 99M SESTAMIBI GENERIC - CARDIOLITE
31.2000 | Freq: Once | INTRAVENOUS | Status: AC | PRN
  Administered 2013-04-23: 31.2 via INTRAVENOUS

## 2013-04-23 MED ORDER — TECHNETIUM TC 99M SESTAMIBI GENERIC - CARDIOLITE
10.8000 | Freq: Once | INTRAVENOUS | Status: AC | PRN
  Administered 2013-04-23: 11 via INTRAVENOUS

## 2013-04-23 MED ORDER — REGADENOSON 0.4 MG/5ML IV SOLN
0.4000 mg | Freq: Once | INTRAVENOUS | Status: AC
  Administered 2013-04-23: 0.4 mg via INTRAVENOUS

## 2013-04-23 NOTE — Procedures (Addendum)
[facility] [address] [place] [id number] [phone]  Cardiology Nuclear Med Study  Mack HookHerman W Schwall is a 73 y.o. male     MRN : [id number]     DOB: Sep 13, 1940  Procedure Date: 04/23/2013  Nuclear Med Background Indication for Stress Test:  Surgical Clearance History:  Asthma Cardiac Risk Factors: Carotid Disease, Family History - CAD, Hypertension, Lipids and Overweight  Symptoms:  Dizziness and DOE   Nuclear Pre-Procedure Caffeine/Decaff Intake:  1:00am NPO After: 11am   IV Site: R Antecubital  IV 0.9% NS with Angio Cath:  22g  Chest Size (in):  38"  IV Started by: Emmit PomfretAmanda Hicks, RN  Height: 5\' 5"  (1.651 m)  Cup Size: n/a  BMI:  Body mass index is 28.79 kg/(m^2). Weight:  173 lb (78.472 kg)   Tech Comments: Patient was unable to walk treadmill due to beginning blood pressure of 186/115. Test was changed to a Lexiscan.    Nuclear Med Study 1 or 2 day study: 1 day  Stress Test Type: Lexiscan  Order Authorizing Provider:  Nanetta BattyJonathan Myah Guynes, MD   Resting Radionuclide: Technetium 8814m Sestamibi  Resting Radionuclide Dose: 10.8 mCi   Stress Radionuclide:  Technetium 3114m Sestamibi  Stress Radionuclide Dose: 31.2 mCi           Stress Protocol Rest HR: 80 Stress HR: 106  Rest BP: 199/110 Stress BP: 182100  Exercise Time (min): n/a METS: n/a          Dose of Adenosine (mg):  n/a Dose of Lexiscan: 0.4 mg  Dose of Atropine (mg): n/a Dose of Dobutamine: n/a mcg/kg/min (at max HR)  Stress Test Technologist: Ernestene MentionGwen Farrington, CCT Nuclear Technologist: Gonzella LexPam Phillips, CNMT   Rest Procedure:  Myocardial perfusion imaging was performed at rest 45 minutes following the intravenous administration of Technetium 3314m Sestamibi. Stress Procedure:  The patient received IV Lexiscan 0.4 mg over 15-seconds.  Technetium 6814m Sestamibi injected at 30-seconds.  There were no significant changes with Lexiscan.  Quantitative spect images were  obtained after a 45 minute delay.  Transient Ischemic Dilatation (Normal <1.22):  1.10 Lung/Heart Ratio (Normal <0.45):  0.34 QGS EDV:  65 ml QGS ESV:  26 ml LV Ejection Fraction: 60%        Rest ECG: NSR - Normal EKG  Stress ECG: No significant change from baseline ECG  QPS Raw Data Images:  Normal; no motion artifact; normal heart/lung ratio. Stress Images:  Normal homogeneous uptake in all areas of the myocardium. Rest Images:  Normal homogeneous uptake in all areas of the myocardium. Subtraction (SDS):  No evidence of ischemia.  Impression Exercise Capacity:  Lexiscan with no exercise. BP Response:  Normal blood pressure response. Clinical Symptoms:  No significant symptoms noted. ECG Impression:  No significant ST segment change suggestive of ischemia. Comparison with Prior Nuclear Study: No previous nuclear study performed  Overall Impression:  Normal stress nuclear study.  LV Wall Motion:  NL LV Function; NL Wall Motion   Runell GessBERRY,Colburn Asper J, MD  04/23/2013 5:13 PM

## 2013-05-01 ENCOUNTER — Other Ambulatory Visit: Payer: Self-pay | Admitting: *Deleted

## 2013-05-01 ENCOUNTER — Encounter (HOSPITAL_COMMUNITY): Payer: Self-pay | Admitting: Pharmacy Technician

## 2013-05-03 NOTE — Pre-Procedure Instructions (Signed)
Mack HookHerman W Misenheimer  05/03/2013   Your procedure is scheduled on:  Wednesday, April 1st.   [facility]  2 * 3 at  6:30 AM.   Call this number if you have problems the morning of surgery: [phone]   Remember:   Do not eat food or drink liquids after midnight Tuesday.   Take these medicines the morning of surgery with A SIP OF WATER: Omeprazole, Tramadol (if needed)   Do not wear jewelry - no rings or watches.  Do not wear lotions or colognes. You may NOT wear deodorant.   Men may shave face and neck.   Do not bring valuables to the hospital.  [facility] not [facility] for any belongings or valuables.               Contacts, dentures or bridgework may not be worn into surgery.  Leave suitcase in the car. After surgery it may be brought to your room.  For patients admitted to the hospital, discharge time is determined by your treatment team.    Name and phone number of your driver:    Special Instructions: "Preparing for Surgery" instruction sheet.   Please read over the following fact sheets that you were given: Pain Booklet, Coughing and Deep Breathing, Blood Transfusion Information, MRSA Information and Surgical Site Infection Prevention

## 2013-05-06 ENCOUNTER — Ambulatory Visit: Payer: Medicare Other | Admitting: Cardiovascular Disease

## 2013-05-06 ENCOUNTER — Encounter (HOSPITAL_COMMUNITY)
Admission: RE | Admit: 2013-05-06 | Discharge: 2013-05-06 | Disposition: A | Discharge status: Home / Self Care | Payer: Medicare Other | Source: Ambulatory Visit | Attending: Anesthesiology | Admitting: Anesthesiology

## 2013-05-06 ENCOUNTER — Encounter (HOSPITAL_COMMUNITY)
Admission: RE | Admit: 2013-05-06 | Discharge: 2013-05-06 | Disposition: A | Discharge status: Home / Self Care | Payer: Medicare Other | Source: Ambulatory Visit | Attending: Vascular Surgery | Admitting: Vascular Surgery

## 2013-05-06 ENCOUNTER — Encounter (HOSPITAL_COMMUNITY): Payer: Self-pay

## 2013-05-06 HISTORY — DX: Unspecified hemorrhoids: K64.9

## 2013-05-06 HISTORY — DX: Headache: R51

## 2013-05-06 HISTORY — DX: Low back pain: M54.5

## 2013-05-06 HISTORY — DX: Unspecified cataract: H26.9

## 2013-05-06 HISTORY — DX: Personal history of colonic polyps: Z86.010

## 2013-05-06 HISTORY — DX: Unspecified asthma, uncomplicated: J45.909

## 2013-05-06 HISTORY — DX: Type 2 diabetes mellitus without complications: E11.9

## 2013-05-06 HISTORY — DX: Personal history of colon polyps, unspecified: Z86.0100

## 2013-05-06 HISTORY — DX: Personal history of other diseases of the respiratory system: Z87.09

## 2013-05-06 HISTORY — DX: Pain in unspecified joint: M25.50

## 2013-05-06 HISTORY — DX: Low back pain, unspecified: M54.50

## 2013-05-06 HISTORY — DX: Frequency of micturition: R35.0

## 2013-05-06 LAB — URINALYSIS, ROUTINE W REFLEX MICROSCOPIC
Bilirubin Urine: NEGATIVE
Glucose, UA: NEGATIVE mg/dL
HGB URINE DIPSTICK: NEGATIVE
Ketones, ur: NEGATIVE mg/dL
Leukocytes, UA: NEGATIVE
Nitrite: NEGATIVE
PH: 6 (ref 5.0–8.0)
Protein, ur: NEGATIVE mg/dL
SPECIFIC GRAVITY, URINE: 1.008 (ref 1.005–1.030)
Urobilinogen, UA: 0.2 mg/dL (ref 0.0–1.0)

## 2013-05-06 LAB — COMPREHENSIVE METABOLIC PANEL
ALK PHOS: 47 U/L (ref 39–117)
ALT: 27 U/L (ref 0–53)
AST: 21 U/L (ref 0–37)
Albumin: 4.1 g/dL (ref 3.5–5.2)
BUN: 17 mg/dL (ref 6–23)
CALCIUM: 9.6 mg/dL (ref 8.4–10.5)
CO2: 22 mEq/L (ref 19–32)
Chloride: 104 mEq/L (ref 96–112)
Creatinine, Ser: 1.06 mg/dL (ref 0.50–1.35)
GFR calc non Af Amer: 68 mL/min — ABNORMAL LOW (ref >90)
GFR, EST AFRICAN AMERICAN: 78 mL/min — AB (ref >90)
GLUCOSE: 131 mg/dL — AB (ref 70–99)
POTASSIUM: 4.3 meq/L (ref 3.7–5.3)
SODIUM: 141 meq/L (ref 137–147)
Total Bilirubin: 0.5 mg/dL (ref 0.3–1.2)
Total Protein: 7 g/dL (ref 6.0–8.3)

## 2013-05-06 LAB — CBC
HCT: 43.3 % (ref 39.0–52.0)
HEMOGLOBIN: 15.6 g/dL (ref 13.0–17.0)
MCH: 33.9 pg (ref 26.0–34.0)
MCHC: 36 g/dL (ref 30.0–36.0)
MCV: 94.1 fL (ref 78.0–100.0)
PLATELETS: 217 10*3/uL (ref 150–400)
RBC: 4.6 MIL/uL (ref 4.22–5.81)
RDW: 13.4 % (ref 11.5–15.5)
WBC: 4.2 10*3/uL (ref 4.0–10.5)

## 2013-05-06 LAB — SURGICAL PCR SCREEN
MRSA, PCR: NEGATIVE
STAPHYLOCOCCUS AUREUS: NEGATIVE

## 2013-05-06 LAB — TYPE AND SCREEN
ABO/RH(D): A POS
ANTIBODY SCREEN: NEGATIVE

## 2013-05-06 LAB — APTT: APTT: 28 s (ref 24–37)

## 2013-05-06 LAB — PROTIME-INR
INR: 0.94 (ref 0.00–1.49)
Prothrombin Time: 12.4 seconds (ref 11.6–15.2)

## 2013-05-06 LAB — ABO/RH: ABO/RH(D): A POS

## 2013-05-06 IMAGING — CR DG CHEST 2V
2 series · 2 of 2 positions shown · non-contrast
Comparison: None.

CLINICAL DATA: Hypertension, for admission

EXAM:
CHEST  2 VIEW

[w chest pa]
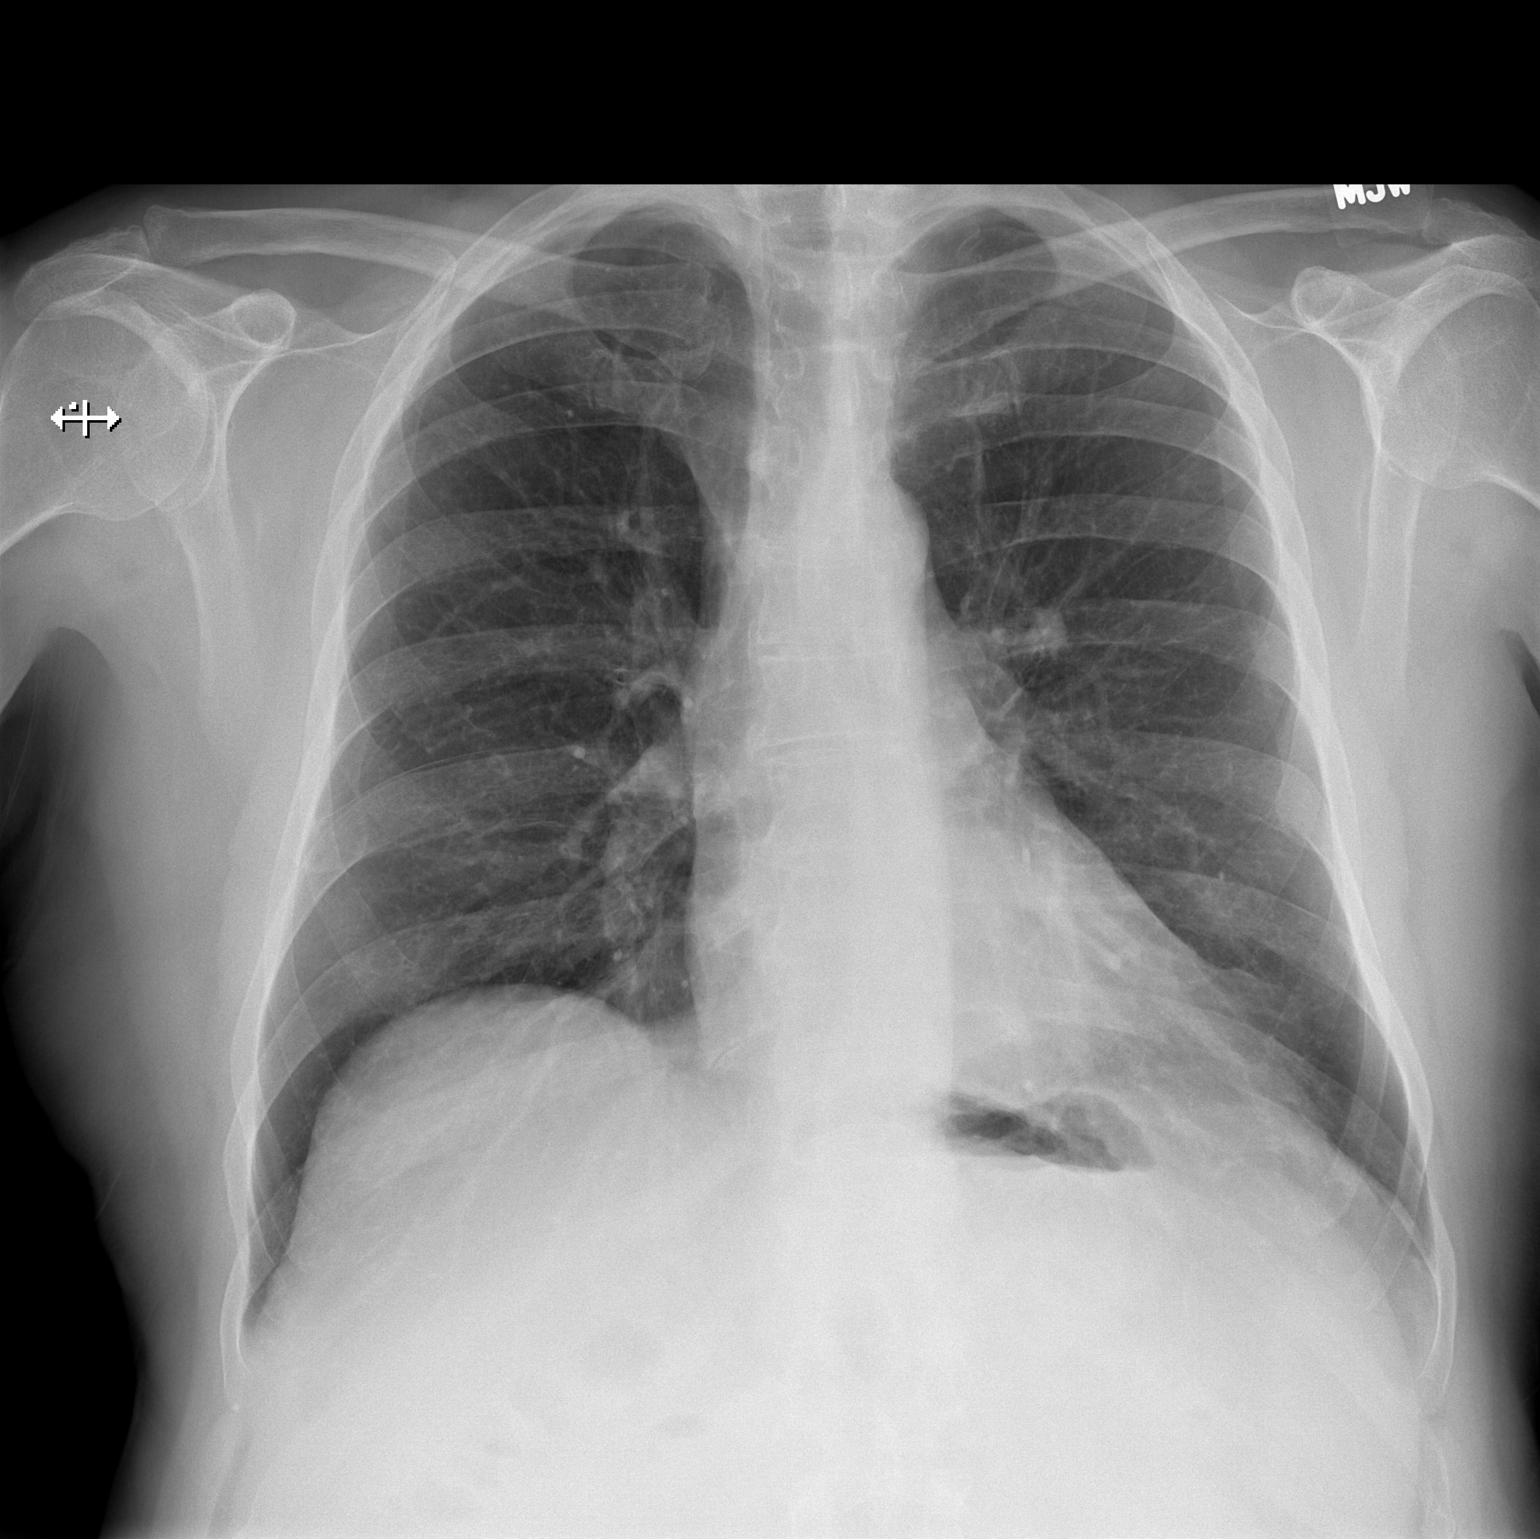

[w chest lat]
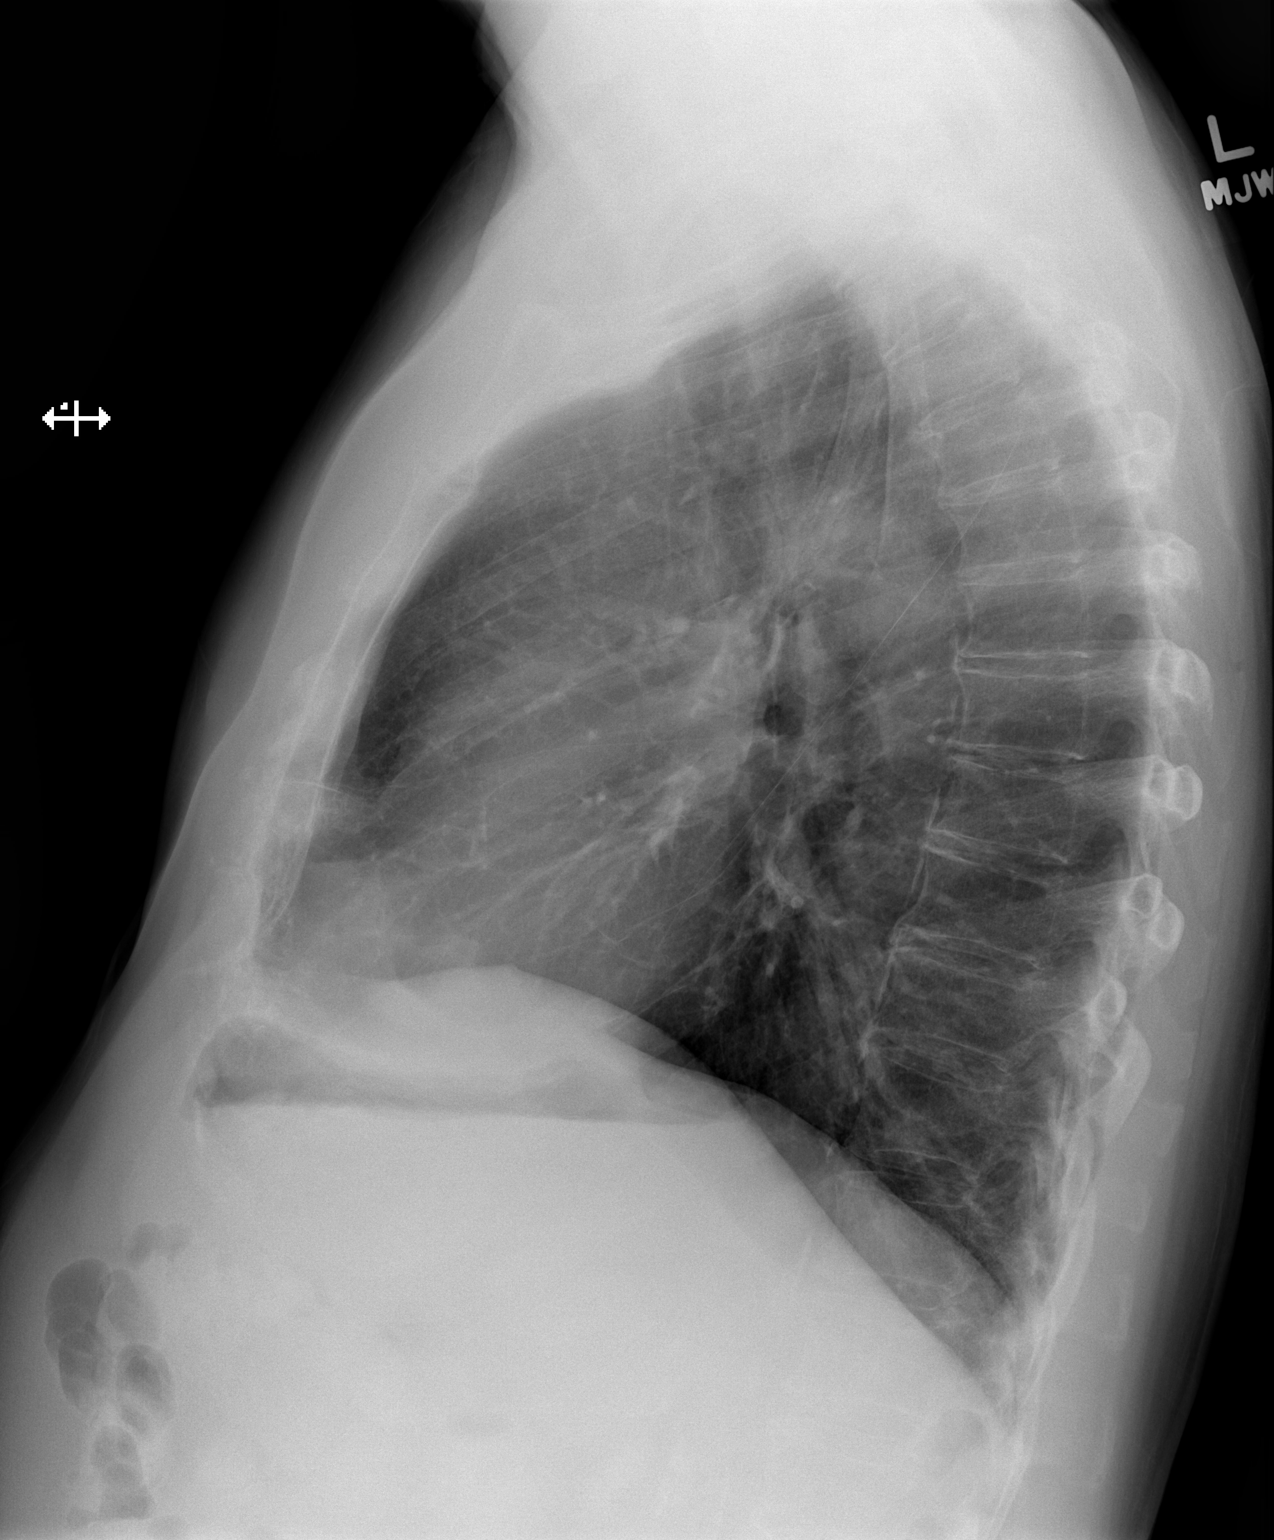

[2 of 2 positions shown; findings below may reference images not displayed]

FINDINGS: The heart size and vascular pattern are normal. Lungs are clear. No
effusion or consolidation.
IMPRESSION: No active cardiopulmonary disease.

## 2013-05-06 MED ORDER — CHLORHEXIDINE GLUCONATE 4 % EX LIQD: 60.0000 mL | Freq: Once | CUTANEOUS | Status: DC

## 2013-05-06 NOTE — Progress Notes (Signed)
Cardiologist is Dr.Berry with last visit in epic  Stress test in epic from 2015  Denies ever having an echo/heart cath  Medical Md is Dr.Kevin Little  EKG in epic from 04-17-13  Denies CXR in past yr

## 2013-05-06 NOTE — Pre-Procedure Instructions (Signed)
Mack HookHerman W Lacombe  05/06/2013   Your procedure is scheduled on:  Wed, April 1 @ 8:30 AM  [facility] at 6:30 AM.  Call this number if you have problems the morning of surgery: [phone]   Remember:   Do not eat food or drink liquids after midnight.   Take these medicines the morning of surgery with A SIP OF WATER: Omeprazole(Prilosec) and Ultram(Tramadol)                           Stop taking your Aspirin and Krill Oil. No Goody's,BC's,Aleve,Ibuprofen,Fish Oil,or any Herbal Medications   Do not wear jewelry  Do not wear lotions, powders, or colognes. You may wear deodorant.  Men may shave face and neck.  Do not bring valuables to the hospital.  [facility] is not responsible                  for any belongings or valuables.               Contacts, dentures or bridgework may not be worn into surgery.  Leave suitcase in the car. After surgery it may be brought to your room.  For patients admitted to the hospital, discharge time is determined by your                treatment team.               Special Instructions:  [place] - Preparing for Surgery  Before surgery, you can play an important role.  Because skin is not sterile, your skin needs to be as free of germs as possible.  You can reduce the number of germs on you skin by washing with CHG (chlorahexidine gluconate) soap before surgery.  CHG is an antiseptic cleaner which kills germs and bonds with the skin to continue killing germs even after washing.  Please DO NOT use if you have an allergy to CHG or antibacterial soaps.  If your skin becomes reddened/irritated stop using the CHG and inform your nurse when you arrive at Short Stay.  Do not shave (including legs and underarms) for at least 48 hours prior to the first CHG shower.  You may shave your face.  Please follow these instructions carefully:   1.  Shower with CHG Soap the night before surgery and the                                morning of  Surgery.  2.  If you choose to wash your hair, wash your hair first as usual with your       normal shampoo.  3.  After you shampoo, rinse your hair and body thoroughly to remove the                      Shampoo.  4.  Use CHG as you would any other liquid soap.  You can apply chg directly       to the skin and wash gently with scrungie or a clean washcloth.  5.  Apply the CHG Soap to your body ONLY FROM THE NECK DOWN.        Do not use on open wounds or open sores.  Avoid contact with your eyes,       ears, mouth and genitals (private parts).  Wash genitals (private parts)  with your normal soap.  6.  Wash thoroughly, paying special attention to the area where your surgery        will be performed.  7.  Thoroughly rinse your body with warm water from the neck down.  8.  DO NOT shower/wash with your normal soap after using and rinsing off       the CHG Soap.  9.  Pat yourself dry with a clean towel.            10.  Wear clean pajamas.            11.  Place clean sheets on your bed the night of your first shower and do not        sleep with pets.  Day of Surgery  Do not apply any lotions/deoderants the morning of surgery.  Please wear clean clothes to the hospital/surgery center.     Please read over the following fact sheets that you were given: Pain Booklet, Coughing and Deep Breathing, Blood Transfusion Information, MRSA Information and Surgical Site Infection Prevention

## 2013-05-07 MED ORDER — VANCOMYCIN HCL IN DEXTROSE 1-5 GM/200ML-% IV SOLN
1000.0000 mg | INTRAVENOUS | Status: AC
  Administered 2013-05-08: 1000 mg via INTRAVENOUS
  Filled 2013-05-07: qty 200

## 2013-05-07 MED ORDER — SODIUM CHLORIDE 0.9 % IV SOLN
INTRAVENOUS | Status: DC
Start: 2013-05-07 — End: 2013-05-08

## 2013-05-08 ENCOUNTER — Encounter (HOSPITAL_COMMUNITY): Payer: Medicare Other | Admitting: Anesthesiology

## 2013-05-08 ENCOUNTER — Inpatient Hospital Stay (HOSPITAL_COMMUNITY): Payer: Medicare Other | Admitting: Anesthesiology

## 2013-05-08 ENCOUNTER — Encounter (HOSPITAL_COMMUNITY): Payer: Self-pay | Admitting: *Deleted

## 2013-05-08 ENCOUNTER — Inpatient Hospital Stay (HOSPITAL_COMMUNITY)
Admission: RE | Admit: 2013-05-08 | Discharge: 2013-05-10 | DRG: 038 | Disposition: A | Discharge status: Home / Self Care | Payer: Medicare Other | Source: Ambulatory Visit | Attending: Vascular Surgery | Admitting: Vascular Surgery

## 2013-05-08 ENCOUNTER — Encounter (HOSPITAL_COMMUNITY)
Admission: RE | Disposition: A | Discharge status: Home / Self Care | Payer: Self-pay | Source: Ambulatory Visit | Attending: Vascular Surgery

## 2013-05-08 DIAGNOSIS — H5440 Blindness, one eye, unspecified eye: Secondary | ICD-10-CM | POA: Diagnosis present

## 2013-05-08 DIAGNOSIS — M1990 Unspecified osteoarthritis, unspecified site: Secondary | ICD-10-CM | POA: Diagnosis present

## 2013-05-08 DIAGNOSIS — E785 Hyperlipidemia, unspecified: Secondary | ICD-10-CM | POA: Diagnosis present

## 2013-05-08 DIAGNOSIS — K219 Gastro-esophageal reflux disease without esophagitis: Secondary | ICD-10-CM | POA: Diagnosis present

## 2013-05-08 DIAGNOSIS — Z87891 Personal history of nicotine dependence: Secondary | ICD-10-CM

## 2013-05-08 DIAGNOSIS — I10 Essential (primary) hypertension: Secondary | ICD-10-CM | POA: Diagnosis present

## 2013-05-08 DIAGNOSIS — Z881 Allergy status to other antibiotic agents status: Secondary | ICD-10-CM

## 2013-05-08 DIAGNOSIS — I739 Peripheral vascular disease, unspecified: Secondary | ICD-10-CM

## 2013-05-08 DIAGNOSIS — Z888 Allergy status to other drugs, medicaments and biological substances status: Secondary | ICD-10-CM

## 2013-05-08 DIAGNOSIS — D62 Acute posthemorrhagic anemia: Secondary | ICD-10-CM | POA: Diagnosis not present

## 2013-05-08 DIAGNOSIS — I6529 Occlusion and stenosis of unspecified carotid artery: Principal | ICD-10-CM | POA: Diagnosis present

## 2013-05-08 DIAGNOSIS — I779 Disorder of arteries and arterioles, unspecified: Secondary | ICD-10-CM

## 2013-05-08 DIAGNOSIS — Z880 Allergy status to penicillin: Secondary | ICD-10-CM

## 2013-05-08 DIAGNOSIS — R339 Retention of urine, unspecified: Secondary | ICD-10-CM | POA: Diagnosis not present

## 2013-05-08 HISTORY — PX: ENDARTERECTOMY: SHX5162

## 2013-05-08 HISTORY — PX: CAROTID ENDARTERECTOMY: SUR193

## 2013-05-08 LAB — GLUCOSE, CAPILLARY
GLUCOSE-CAPILLARY: 126 mg/dL — AB (ref 70–99)
Glucose-Capillary: 163 mg/dL — ABNORMAL HIGH (ref 70–99)

## 2013-05-08 SURGERY — ENDARTERECTOMY, CAROTID
Anesthesia: General | Site: Neck | Laterality: Left

## 2013-05-08 MED ORDER — ALUM & MAG HYDROXIDE-SIMETH 200-200-20 MG/5ML PO SUSP: 15.0000 mL | ORAL | Status: DC | PRN

## 2013-05-08 MED ORDER — PHENOL 1.4 % MT LIQD: 1.0000 | OROMUCOSAL | Status: DC | PRN

## 2013-05-08 MED ORDER — OXYCODONE HCL 5 MG PO TABS
5.0000 mg | ORAL_TABLET | Freq: Four times a day (QID) | ORAL | Status: DC | PRN
  Administered 2013-05-08 – 2013-05-10 (×7): 5 mg via ORAL
  Filled 2013-05-08 (×6): qty 1

## 2013-05-08 MED ORDER — VANCOMYCIN HCL IN DEXTROSE 1-5 GM/200ML-% IV SOLN
1000.0000 mg | Freq: Two times a day (BID) | INTRAVENOUS | Status: AC
  Administered 2013-05-08 – 2013-05-09 (×2): 1000 mg via INTRAVENOUS
  Filled 2013-05-08 (×2): qty 200

## 2013-05-08 MED ORDER — HEPARIN SODIUM (PORCINE) 1000 UNIT/ML IJ SOLN
INTRAMUSCULAR | Status: DC | PRN
  Administered 2013-05-08: 8000 [IU] via INTRAVENOUS
  Administered 2013-05-08: 3000 [IU] via INTRAVENOUS

## 2013-05-08 MED ORDER — FENTANYL CITRATE 0.05 MG/ML IJ SOLN
INTRAMUSCULAR | Status: DC | PRN
  Administered 2013-05-08: 250 ug via INTRAVENOUS

## 2013-05-08 MED ORDER — PANTOPRAZOLE SODIUM 40 MG PO TBEC
40.0000 mg | DELAYED_RELEASE_TABLET | Freq: Every day | ORAL | Status: DC
  Administered 2013-05-09 – 2013-05-10 (×2): 40 mg via ORAL
  Filled 2013-05-08 (×2): qty 1

## 2013-05-08 MED ORDER — FENTANYL CITRATE 0.05 MG/ML IJ SOLN
INTRAMUSCULAR | Status: AC
  Filled 2013-05-08: qty 2

## 2013-05-08 MED ORDER — OXYCODONE HCL 5 MG PO TABS
ORAL_TABLET | ORAL | Status: AC
  Filled 2013-05-08: qty 1

## 2013-05-08 MED ORDER — THROMBIN 20000 UNITS EX SOLR
CUTANEOUS | Status: DC | PRN
  Administered 2013-05-08: 11:00:00 via TOPICAL

## 2013-05-08 MED ORDER — SODIUM CHLORIDE 0.9 % IV SOLN
10.0000 mg | INTRAVENOUS | Status: DC | PRN
  Administered 2013-05-08: 20 ug/min via INTRAVENOUS
  Administered 2013-05-08: 11:00:00 via INTRAVENOUS

## 2013-05-08 MED ORDER — PROMETHAZINE HCL 25 MG/ML IJ SOLN: 6.2500 mg | Freq: Four times a day (QID) | INTRAMUSCULAR | Status: DC | PRN

## 2013-05-08 MED ORDER — DROPERIDOL 2.5 MG/ML IJ SOLN: 0.6250 mg | INTRAMUSCULAR | Status: DC | PRN

## 2013-05-08 MED ORDER — PHENYLEPHRINE HCL 10 MG/ML IJ SOLN
INTRAMUSCULAR | Status: AC
  Filled 2013-05-08: qty 1

## 2013-05-08 MED ORDER — ONDANSETRON HCL 4 MG/2ML IJ SOLN
INTRAMUSCULAR | Status: AC
  Filled 2013-05-08: qty 2

## 2013-05-08 MED ORDER — EPHEDRINE SULFATE 50 MG/ML IJ SOLN
INTRAMUSCULAR | Status: DC | PRN
  Administered 2013-05-08: 15 mg via INTRAVENOUS
  Administered 2013-05-08 (×2): 10 mg via INTRAVENOUS

## 2013-05-08 MED ORDER — POTASSIUM CHLORIDE CRYS ER 20 MEQ PO TBCR: 20.0000 meq | EXTENDED_RELEASE_TABLET | Freq: Every day | ORAL | Status: DC | PRN

## 2013-05-08 MED ORDER — EPHEDRINE SULFATE 50 MG/ML IJ SOLN
INTRAMUSCULAR | Status: AC
  Filled 2013-05-08: qty 1

## 2013-05-08 MED ORDER — ACETAMINOPHEN 650 MG RE SUPP: 325.0000 mg | RECTAL | Status: DC | PRN

## 2013-05-08 MED ORDER — GLYCOPYRROLATE 0.2 MG/ML IJ SOLN
INTRAMUSCULAR | Status: DC | PRN
  Administered 2013-05-08: 0.6 mg via INTRAVENOUS
  Administered 2013-05-08: .2 mg via INTRAVENOUS
  Administered 2013-05-08: 0.1 mg via INTRAVENOUS

## 2013-05-08 MED ORDER — 0.9 % SODIUM CHLORIDE (POUR BTL) OPTIME
TOPICAL | Status: DC | PRN
  Administered 2013-05-08: 2000 mL

## 2013-05-08 MED ORDER — PROTAMINE SULFATE 10 MG/ML IV SOLN
INTRAVENOUS | Status: AC
  Filled 2013-05-08: qty 5

## 2013-05-08 MED ORDER — LIDOCAINE HCL (CARDIAC) 20 MG/ML IV SOLN
INTRAVENOUS | Status: DC | PRN
  Administered 2013-05-08: 25 mg via INTRAVENOUS

## 2013-05-08 MED ORDER — FENTANYL CITRATE 0.05 MG/ML IJ SOLN
25.0000 ug | INTRAMUSCULAR | Status: DC | PRN
  Administered 2013-05-08: 25 ug via INTRAVENOUS

## 2013-05-08 MED ORDER — HYDRALAZINE HCL 20 MG/ML IJ SOLN
10.0000 mg | INTRAMUSCULAR | Status: DC | PRN
  Administered 2013-05-09: 10 mg via INTRAVENOUS
  Filled 2013-05-08: qty 1

## 2013-05-08 MED ORDER — ARTIFICIAL TEARS OP OINT
TOPICAL_OINTMENT | OPHTHALMIC | Status: DC | PRN
  Administered 2013-05-08: 1 via OPHTHALMIC

## 2013-05-08 MED ORDER — MORPHINE SULFATE 2 MG/ML IJ SOLN
2.0000 mg | INTRAMUSCULAR | Status: DC | PRN
  Administered 2013-05-08: 2 mg via INTRAVENOUS
  Filled 2013-05-08: qty 1

## 2013-05-08 MED ORDER — NEOSTIGMINE METHYLSULFATE 1 MG/ML IJ SOLN
INTRAMUSCULAR | Status: DC | PRN
  Administered 2013-05-08: 3 mg via INTRAVENOUS

## 2013-05-08 MED ORDER — NEOSTIGMINE METHYLSULFATE 1 MG/ML IJ SOLN
INTRAMUSCULAR | Status: AC
  Filled 2013-05-08: qty 10

## 2013-05-08 MED ORDER — HEPARIN SODIUM (PORCINE) 5000 UNIT/ML IJ SOLN
INTRAMUSCULAR | Status: DC | PRN
  Administered 2013-05-08: 09:00:00

## 2013-05-08 MED ORDER — ASPIRIN EC 81 MG PO TBEC
81.0000 mg | DELAYED_RELEASE_TABLET | Freq: Every morning | ORAL | Status: DC
  Administered 2013-05-09 – 2013-05-10 (×2): 81 mg via ORAL
  Filled 2013-05-08 (×2): qty 1

## 2013-05-08 MED ORDER — SODIUM CHLORIDE 0.9 % IJ SOLN
INTRAMUSCULAR | Status: AC
  Filled 2013-05-08: qty 10

## 2013-05-08 MED ORDER — MAGNESIUM SULFATE 40 MG/ML IJ SOLN: 2.0000 g | Freq: Every day | INTRAMUSCULAR | Status: DC | PRN

## 2013-05-08 MED ORDER — ONDANSETRON HCL 4 MG/2ML IJ SOLN
INTRAMUSCULAR | Status: DC | PRN
  Administered 2013-05-08: 4 mg via INTRAVENOUS

## 2013-05-08 MED ORDER — HYDROCORTISONE 1 % EX CREA
1.0000 "application " | TOPICAL_CREAM | Freq: Two times a day (BID) | CUTANEOUS | Status: DC | PRN
  Filled 2013-05-08: qty 28

## 2013-05-08 MED ORDER — ACETAMINOPHEN 325 MG PO TABS
325.0000 mg | ORAL_TABLET | ORAL | Status: DC | PRN
  Administered 2013-05-09 (×2): 650 mg via ORAL
  Filled 2013-05-08 (×2): qty 2

## 2013-05-08 MED ORDER — GLYCOPYRROLATE 0.2 MG/ML IJ SOLN
INTRAMUSCULAR | Status: AC
  Filled 2013-05-08: qty 4

## 2013-05-08 MED ORDER — DOPAMINE-DEXTROSE 3.2-5 MG/ML-% IV SOLN: 3.0000 ug/kg/min | INTRAVENOUS | Status: DC

## 2013-05-08 MED ORDER — LABETALOL HCL 5 MG/ML IV SOLN
10.0000 mg | INTRAVENOUS | Status: DC | PRN
  Administered 2013-05-09 (×3): 10 mg via INTRAVENOUS
  Filled 2013-05-08 (×3): qty 4

## 2013-05-08 MED ORDER — METOPROLOL TARTRATE 1 MG/ML IV SOLN
2.0000 mg | INTRAVENOUS | Status: DC | PRN
  Administered 2013-05-09: 5 mg via INTRAVENOUS
  Filled 2013-05-08: qty 5

## 2013-05-08 MED ORDER — LORATADINE 10 MG PO TABS
10.0000 mg | ORAL_TABLET | Freq: Every evening | ORAL | Status: DC
Start: 2013-05-08 — End: 2013-05-10
  Administered 2013-05-08 – 2013-05-09 (×2): 10 mg via ORAL
  Filled 2013-05-08 (×3): qty 1

## 2013-05-08 MED ORDER — PROTAMINE SULFATE 10 MG/ML IV SOLN
INTRAVENOUS | Status: DC | PRN
  Administered 2013-05-08 (×4): 20 mg via INTRAVENOUS

## 2013-05-08 MED ORDER — SODIUM CHLORIDE 0.9 % IV SOLN
INTRAVENOUS | Status: DC
  Administered 2013-05-08: 15:00:00 via INTRAVENOUS

## 2013-05-08 MED ORDER — GUAIFENESIN-DM 100-10 MG/5ML PO SYRP: 15.0000 mL | ORAL_SOLUTION | ORAL | Status: DC | PRN

## 2013-05-08 MED ORDER — LACTATED RINGERS IV SOLN
INTRAVENOUS | Status: DC | PRN
  Administered 2013-05-08 (×2): via INTRAVENOUS

## 2013-05-08 MED ORDER — DOXAZOSIN MESYLATE 8 MG PO TABS
8.0000 mg | ORAL_TABLET | Freq: Every evening | ORAL | Status: DC
  Administered 2013-05-09: 8 mg via ORAL
  Filled 2013-05-08 (×2): qty 1

## 2013-05-08 MED ORDER — SODIUM CHLORIDE 0.9 % IV SOLN: 500.0000 mL | Freq: Once | INTRAVENOUS | Status: AC | PRN

## 2013-05-08 MED ORDER — FENTANYL CITRATE 0.05 MG/ML IJ SOLN
INTRAMUSCULAR | Status: AC
  Filled 2013-05-08: qty 5

## 2013-05-08 MED ORDER — HEPARIN SODIUM (PORCINE) 1000 UNIT/ML IJ SOLN
INTRAMUSCULAR | Status: AC
Start: 2013-05-08 — End: 2013-05-08
  Filled 2013-05-08: qty 1

## 2013-05-08 MED ORDER — GLYCOPYRROLATE 0.2 MG/ML IJ SOLN
INTRAMUSCULAR | Status: AC
  Filled 2013-05-08: qty 1

## 2013-05-08 MED ORDER — PROPOFOL 10 MG/ML IV BOLUS
INTRAVENOUS | Status: AC
  Filled 2013-05-08: qty 20

## 2013-05-08 MED ORDER — ROCURONIUM BROMIDE 50 MG/5ML IV SOLN
INTRAVENOUS | Status: AC
Start: 2013-05-08 — End: 2013-05-08
  Filled 2013-05-08: qty 1

## 2013-05-08 MED ORDER — OXYCODONE HCL 5 MG PO TABS: 5.0000 mg | ORAL_TABLET | Freq: Four times a day (QID) | ORAL | Status: DC | PRN

## 2013-05-08 MED ORDER — THROMBIN 20000 UNITS EX SOLR
CUTANEOUS | Status: AC
  Filled 2013-05-08: qty 20000

## 2013-05-08 MED ORDER — SUCCINYLCHOLINE CHLORIDE 20 MG/ML IJ SOLN
INTRAMUSCULAR | Status: AC
  Filled 2013-05-08: qty 1

## 2013-05-08 MED ORDER — DOCUSATE SODIUM 100 MG PO CAPS
100.0000 mg | ORAL_CAPSULE | Freq: Every day | ORAL | Status: DC
  Administered 2013-05-09 – 2013-05-10 (×2): 100 mg via ORAL
  Filled 2013-05-08 (×2): qty 1

## 2013-05-08 MED ORDER — LIDOCAINE HCL (PF) 1 % IJ SOLN
INTRAMUSCULAR | Status: AC
  Filled 2013-05-08: qty 30

## 2013-05-08 MED ORDER — TRAMADOL HCL 50 MG PO TABS
50.0000 mg | ORAL_TABLET | Freq: Two times a day (BID) | ORAL | Status: DC | PRN
  Administered 2013-05-08 – 2013-05-09 (×3): 50 mg via ORAL
  Filled 2013-05-08 (×3): qty 1

## 2013-05-08 MED ORDER — ROCURONIUM BROMIDE 100 MG/10ML IV SOLN
INTRAVENOUS | Status: DC | PRN
  Administered 2013-05-08: 50 mg via INTRAVENOUS

## 2013-05-08 MED ORDER — PROPOFOL 10 MG/ML IV BOLUS
INTRAVENOUS | Status: DC | PRN
  Administered 2013-05-08: 30 mg via INTRAVENOUS
  Administered 2013-05-08: 70 mg via INTRAVENOUS

## 2013-05-08 MED ORDER — ONDANSETRON HCL 4 MG/2ML IJ SOLN
4.0000 mg | Freq: Four times a day (QID) | INTRAMUSCULAR | Status: DC | PRN
  Administered 2013-05-08: 4 mg via INTRAVENOUS
  Filled 2013-05-08: qty 2

## 2013-05-08 SURGICAL SUPPLY — 52 items
ADH SKN CLS APL DERMABOND .7 (GAUZE/BANDAGES/DRESSINGS) ×1
BLADE SURG CLIPPER 3M 9600 (MISCELLANEOUS) ×1 IMPLANT
CANISTER SUCTION 2500CC (MISCELLANEOUS) ×2 IMPLANT
CANNULA VESSEL 3MM 2 BLNT TIP (CANNULA) ×2 IMPLANT
CATH ROBINSON RED A/P 18FR (CATHETERS) ×2 IMPLANT
CLIP TI MEDIUM 6 (CLIP) ×2 IMPLANT
CLIP TI WIDE RED SMALL 6 (CLIP) ×2 IMPLANT
COVER SURGICAL LIGHT HANDLE (MISCELLANEOUS) ×2 IMPLANT
CRADLE DONUT ADULT HEAD (MISCELLANEOUS) ×2 IMPLANT
DECANTER SPIKE VIAL GLASS SM (MISCELLANEOUS) IMPLANT
DERMABOND ADVANCED (GAUZE/BANDAGES/DRESSINGS) ×1
DERMABOND ADVANCED .7 DNX12 (GAUZE/BANDAGES/DRESSINGS) ×1 IMPLANT
DRAIN HEMOVAC 1/8 X 5 (WOUND CARE) IMPLANT
DRAPE WARM FLUID 44X44 (DRAPE) ×2 IMPLANT
ELECT REM PT RETURN 9FT ADLT (ELECTROSURGICAL) ×2
ELECTRODE REM PT RTRN 9FT ADLT (ELECTROSURGICAL) ×1 IMPLANT
EVACUATOR SILICONE 100CC (DRAIN) IMPLANT
GEL ULTRASOUND 20GR AQUASONIC (MISCELLANEOUS) IMPLANT
GLOVE BIO SURGEON STRL SZ7.5 (GLOVE) ×2 IMPLANT
GLOVE BIOGEL M 6.5 STRL (GLOVE) ×2 IMPLANT
GLOVE BIOGEL PI IND STRL 7.0 (GLOVE) IMPLANT
GLOVE BIOGEL PI INDICATOR 7.0 (GLOVE) ×1
GLOVE SURG SS PI 6.5 STRL IVOR (GLOVE) ×1 IMPLANT
GLOVE SURG SS PI 7.0 STRL IVOR (GLOVE) ×2 IMPLANT
GOWN STRL REUS W/ TWL LRG LVL3 (GOWN DISPOSABLE) ×3 IMPLANT
GOWN STRL REUS W/ TWL XL LVL3 (GOWN DISPOSABLE) IMPLANT
GOWN STRL REUS W/TWL LRG LVL3 (GOWN DISPOSABLE) ×6
GOWN STRL REUS W/TWL XL LVL3 (GOWN DISPOSABLE) ×4
KIT BASIN OR (CUSTOM PROCEDURE TRAY) ×2 IMPLANT
KIT ROOM TURNOVER OR (KITS) ×2 IMPLANT
LOOP VESSEL MINI RED (MISCELLANEOUS) IMPLANT
NDL HYPO 25GX1X1/2 BEV (NEEDLE) IMPLANT
NEEDLE HYPO 25GX1X1/2 BEV (NEEDLE) IMPLANT
NS IRRIG 1000ML POUR BTL (IV SOLUTION) ×4 IMPLANT
PACK CAROTID (CUSTOM PROCEDURE TRAY) ×2 IMPLANT
PAD ARMBOARD 7.5X6 YLW CONV (MISCELLANEOUS) ×4 IMPLANT
PATCH HEMASHIELD 8X75 (Vascular Products) ×1 IMPLANT
SHUNT CAROTID BYPASS 10 (VASCULAR PRODUCTS) ×1 IMPLANT
SHUNT CAROTID BYPASS 12FRX15.5 (VASCULAR PRODUCTS) IMPLANT
SPONGE SURGIFOAM ABS GEL 100 (HEMOSTASIS) ×1 IMPLANT
SUT ETHILON 3 0 PS 1 (SUTURE) IMPLANT
SUT PROLENE 6 0 CC (SUTURE) ×2 IMPLANT
SUT PROLENE 7 0 BV 1 (SUTURE) IMPLANT
SUT SILK 3 0 TIES 17X18 (SUTURE)
SUT SILK 3-0 18XBRD TIE BLK (SUTURE) IMPLANT
SUT VIC AB 3-0 SH 27 (SUTURE) ×2
SUT VIC AB 3-0 SH 27X BRD (SUTURE) ×1 IMPLANT
SUT VICRYL 4-0 PS2 18IN ABS (SUTURE) ×2 IMPLANT
SYR CONTROL 10ML LL (SYRINGE) IMPLANT
TOWEL OR 17X24 6PK STRL BLUE (TOWEL DISPOSABLE) ×2 IMPLANT
TOWEL OR 17X26 10 PK STRL BLUE (TOWEL DISPOSABLE) ×2 IMPLANT
WATER STERILE IRR 1000ML POUR (IV SOLUTION) ×2 IMPLANT

## 2013-05-08 NOTE — Anesthesia Postprocedure Evaluation (Signed)
  Anesthesia Post-op Note  Patient: David Sellers  Procedure(s) Performed: Procedure(s): LEFT CAROTID ARTERY ENDARTERECTOMY WITH DACRON PATCH ANGIOPLASTY (Left)  Patient Location: PACU  Anesthesia Type:General  Level of Consciousness: awake, alert , oriented and patient cooperative  Airway and Oxygen Therapy: Patient Spontanous Breathing  Post-op Pain: none  Post-op Assessment: Post-op Vital signs reviewed, Patient's Cardiovascular Status Stable, Respiratory Function Stable, Patent Airway, No signs of Nausea or vomiting and Pain level controlled  Post-op Vital Signs: Reviewed and stable  Complications: No apparent anesthesia complications

## 2013-05-08 NOTE — Transfer of Care (Signed)
Immediate Anesthesia Transfer of Care Note  Patient: David Sellers  Procedure(s) Performed: Procedure(s): LEFT CAROTID ARTERY ENDARTERECTOMY WITH DACRON PATCH ANGIOPLASTY (Left)  Patient Location: PACU  Anesthesia Type:General  Level of Consciousness: awake, alert  and oriented  Airway & Oxygen Therapy: Patient Spontanous Breathing and Patient connected to nasal cannula oxygen  Post-op Assessment: Report given to PACU RN, Post -op Vital signs reviewed and stable, Patient moving all extremities, Patient moving all extremities X 4 and Patient able to stick tongue midline  Post vital signs: Reviewed and stable  Complications: No apparent anesthesia complications

## 2013-05-08 NOTE — Progress Notes (Signed)
Pt c/o bladder fullness & pain; fells he cannot void.  Bladder scan = 662 ml.  Stood at bedside & voided 120 ml, tolerated standing well.  Post-void bladder scan = 545 ml; pt stated he felt much better.  States his baseline is frequent small voids.

## 2013-05-08 NOTE — Op Note (Signed)
Procedure Left carotid endarterectomy  Preoperative diagnosis: High-grade asymptomatic left internal carotid artery stenosis  Postoperative diagnosis: Same  Anesthesia General  Asst.: Doreatha Massed, PAC  Operative findings: #1 greater than 90% left internal carotid stenosis                                                             #2 Dacron patch           #3 10 Fr shunt                               #4 High distal extent of lesion above hypoglossal nerve  Operative details: After obtaining informed consent, the patient was taken to the operating room. The patient was placed in a supine position on the operating room table. After induction of general anesthesia and endotracheal intubation a Foley catheter was placed. Next the patient's entire neck and chest were prepped and draped in the usual sterile fashion. An oblique incision was made on the left aspect of the patient's neck anterior to the border the left sternocleidomastoid muscle. The incision was carried into the subcutaneous tissues and through the platysma. The sternocleidomastoid muscle was identified and reflected laterally. The omohyoid muscle was identified and this was divided with cautery. The common carotid artery was then found at the base of the incision this was dissected free circumferentially. It was fairly soft on palpation.  The vagus nerve was identified and protected. Dissection was then carried up to the level carotid bifurcation. The bifurcation was slightly high and the lesion extended distally up to and slightly past the hypoglossal nerve.  This required fairly extensive mobilization of the nerve and ligation of several tethering vessels.  The external carotid was also rotated over the artery which also made dissection more difficult.  The internal carotid artery was dissected free circumferentially just above the level of the hypoglossal nerve and it was soft in character at this location and above any palpable  disease. A vessel loop was placed around this.  Next the external carotid and superior thyroid arteries were dissected free circumferentially and vessel loops were placed around these. The patient was given 8000 units of intravenous heparin.  After 2 minutes of circulation time and raising the mean arterial pressure to 90 mm mercury, the distal internal carotid artery was controlled with small bulldog clamp. The external carotid and superior thyroid arteries were controlled with vessel loops. The common carotid artery was controlled with a peripheral DeBakey clamp. A longitudinal opening was made in the common carotid artery just below the bifurcation. The arteriotomy was extended distally up into the internal carotid with Potts scissors. There was a large calcified plaque with subtotal occlusion of the internal carotid.   An 10 Fr shunt was brought onto the field and fashioned to fit the patient's artery.  This was threaded into the distal internal carotid artery and allowed to backbleed thoroughly.  There was moderate but not pulsatile backbleeding.  This was then threaded into the common carotid and secured with a Rummel tourniquet.  There was a small amount of air in the shunt so the shunt was removed and allowed to backbleed again and the shunt insertion procedure repeated.  There was no air  at this point and flow was restored to the brain.  There was some backbleeding around the distal shunt which was controlled with a Javid shunt clamp.  Attention was then turned to the common carotid artery once again. A suitable endarterectomy plane was obtained and endarterectomy was begun in the common carotid artery and a good proximal endpoint was obtained. An eversion endarterectomy was performed on the external carotid artery and a good endpoint was obtained. The plaque was then elevated in the internal carotid artery and a nice feathered distal endpoint was also obtained.  The plaque was passed off the table. All  loose debris was then removed from the carotid bed and everything was thoroughly irrigated with heparinized saline. A Dacron patch was then brought on to the operative field and this was sewn on as a patch angioplasty using a running 6-0 Prolene suture. Prior to completion of the anastomosis the internal carotid artery was thoroughly backbled. This was then controlled again with a fine bulldog clamp.  The common carotid was thoroughly flushed forward. The external carotid was also thoroughly backbled.  The remainder of the patch was completed and the anastomosis was secured. Flow was then restored first retrograde from the external carotid into the carotid bed then antegrade from the common carotid to the external carotid artery and after approximately 5 cardiac cycles to the internal carotid artery. Doppler was used to evaluate the external/internal and common carotid arteries and these all had good Doppler flow. Hemostasis was obtained with 1 additional repair suture. The patient had been given an additional 3000 units of heparin.  The patient was also given 100 mg of Protamine.      The platysma muscle was reapproximated using a running 3-0 Vicryl suture. The skin was closed with 4 0 Vicryl subcuticular stitch.  The patient was awakened in the operating room and was moving upper and lower extremities symmetrically and following commands.  The patient was stable on arrival to the PACU.  Fabienne Brunsharles Fields, MD [facility] [place] Office: [phone] Pager: [phone]

## 2013-05-08 NOTE — Interval H&P Note (Signed)
History and Physical Interval Note:  05/08/2013 8:24 AM  David HookHerman W Juarez  has presented today for surgery, with the diagnosis of Left carotid stenosis  The various methods of treatment have been discussed with the patient and family. After consideration of risks, benefits and other options for treatment, the patient has consented to  Procedure(s): ENDARTERECTOMY CAROTID (Left) as a surgical intervention .  The patient's history has been reviewed, patient examined, no change in status, stable for surgery.  I have reviewed the patient's chart and labs.  Questions were answered to the patient's satisfaction.     Reason Helzer E

## 2013-05-08 NOTE — H&P (View-Only) (Signed)
[facility] [place] HISTORY AND PHYSICAL   History of Present Illness:  Patient is a 73 y.o. year old male who presents for evaluation of asymptomatic carotid stenosis. The carotid stenosis was found on duplex ultrasound investigation for left neck pain. The patient denies any symptoms of TIA amaurosis or stroke. He apparently had some right arm weakness and 2008 but this was thought to be more to peripheral nerve impingement. He currently takes one aspirin daily. He did take a statin in the past but this was stopped for pain symptoms. He is a former smoker and quit 50 years ago.  Other medical problems include hyperlipidemia, hypertension, arthritis all of which are currently stable. 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  Past Medical History  Diagnosis Date  . GERD (gastroesophageal reflux disease)   . Hyperlipidemia   . Hypertension   . BPH (benign prostatic hyperplasia)   . Arthritis   . Carotid artery occlusion     right ICA 50% stenosis  . Legally blind in left eye, as defined in [place]     hit by a baseball  . Carotodynia     Past Surgical History  Procedure Laterality Date  . Hernia repair    . Retinal laser surgery      Social History History  Substance Use Topics  . Smoking status: Never Smoker   . Smokeless tobacco: Never Used  . Alcohol Use: No    Family History Family History  Problem Relation Age of Onset  . Adopted: Yes    Allergies  Allergies  Allergen Reactions  . Atorvastatin     Severe myalgias  . Darvon [Propoxyphene] Other (See Comments)    hallucinate  . Indomethacin     Neck pain flare  . Penicillins   . Pravastatin     myalgias  . Sulfa Antibiotics      Current Outpatient Prescriptions  Medication Sig Dispense Refill  . aspirin 81 MG tablet Take 81 mg by mouth daily.      Marland Kitchen doxazosin (CARDURA XL) 8 MG 24 hr tablet Take 8 mg by mouth  every evening.      Marland Kitchen KRILL OIL PO Take by mouth 2 (two) times daily.      Marland Kitchen lisinopril (PRINIVIL,ZESTRIL) 30 MG tablet Take 30 mg by mouth 2 (two) times daily.       Marland Kitchen omeprazole (PRILOSEC) 40 MG capsule Take 40 mg by mouth daily.      . traMADol (ULTRAM) 50 MG tablet Take 50 mg by mouth 2 (two) times daily as needed.      . TURMERIC PO Take by mouth 2 (two) times daily.      . verapamil (CALAN) 120 MG tablet Take 120 mg by mouth daily with supper.      . verapamil (COVERA HS) 240 MG (CO) 24 hr tablet Take 240 mg by mouth every morning.      Marland Kitchen atorvastatin (LIPITOR) 10 MG tablet Take 10 mg by mouth every evening.      Marland Kitchen doxazosin (CARDURA) 8 MG tablet Take 8 mg by mouth daily.       No current facility-administered medications for this visit.    ROS:   General:  No weight loss, Fever, chills  HEENT: No recent headaches, no nasal bleeding, no visual changes, no sore throat  Neurologic: No dizziness, blackouts, seizures. No recent symptoms of stroke  or mini- stroke. No recent episodes of slurred speech, or temporary blindness.  Cardiac: No recent episodes of chest pain/pressure, no shortness of breath at rest.  No shortness of breath with exertion.  Denies history of atrial fibrillation or irregular heartbeat  Vascular: No history of rest pain in feet.  No history of claudication.  No history of non-healing ulcer, No history of DVT   Pulmonary: No home oxygen, no productive cough, no hemoptysis,  No asthma or wheezing  Musculoskeletal:  [x ] Arthritis, [ ]  Low back pain,  [ ]  Joint pain  Hematologic:No history of hypercoagulable state.  No history of easy bleeding.  No history of anemia  Gastrointestinal: No hematochezia or melena,  No gastroesophageal reflux, no trouble swallowing  Urinary: [ ]  chronic Kidney disease, [ ]  on HD - [ ]  MWF or [ ]  TTHS, [ ]  Burning with urination, [ ]  Frequent urination, [ ]  Difficulty urinating;   Skin: No rashes  Psychological: No history of  anxiety,  No history of depression   Physical Examination  Filed Vitals:   04/11/13 1522  Resp: 16  Height: 5\' 6"  (1.676 m)  Weight: 173 lb 9.6 oz (78.744 kg)    Body mass index is 28.03 kg/(m^2).  General:  Alert and oriented, no acute distress HEENT: Normal Neck: No bruit or JVD Pulmonary: Clear to auscultation bilaterally Cardiac: Regular Rate and Rhythm without murmur Abdomen: Soft, non-tender, non-distended, no mass Skin: No rash Extremity Pulses:  2+ radial, brachial, femoral pulses bilaterally Musculoskeletal: No deformity or edema  Neurologic: Upper and lower extremity motor 5/5 and symmetric  DATA:  Carotid duplex [facility] was [facility]. This shows greater than 70% left internal carotid artery stenosis with dense calcification 50% stenosis of the right side antegrade vertebral flow. We repeated the left side carotid duplex today for operative planning purposes. This again shows a 60-80% stenosis of the left side but severe calcification probably underestimates the level of stenosis. I reviewed and interpreted this study.    ASSESSMENT:  Asymptomatic high-grade left internal carotid artery stenosis   PLAN:  We will schedule an evaluation for cardiac risk stratification. The patient states he is taking a trip March 20-26. He wishes to defer left carotid endarterectomy to after this. This will be scheduled for 05/08/2013. Risks benefits possible complications and procedure details were explained to the patient and his wife today including but not limited to stroke risk of 1% cranial nerve injury risk of approximately 5-10% small risk of wound problems such as hematoma/ infection.  Fabienne Brunsharles Fields, MD [facility] [place] Office: [phone] Pager: [phone]

## 2013-05-08 NOTE — Anesthesia Procedure Notes (Signed)
Procedure Name: Intubation Date/Time: 05/08/2013 8:38 AM Performed by: Carmela RimaMARTINELLI, Taleia Sadowski F Pre-anesthesia Checklist: Patient identified, Emergency Drugs available, Patient being monitored, Suction available and Timeout performed Patient Re-evaluated:Patient Re-evaluated prior to inductionOxygen Delivery Method: Circle system utilized Preoxygenation: Pre-oxygenation with 100% oxygen Intubation Type: IV induction Ventilation: Mask ventilation without difficulty Laryngoscope Size: Mac and 3 Grade View: Grade I Tube type: Oral Tube size: 7.5 mm Number of attempts: 1 Placement Confirmation: positive ETCO2,  ETT inserted through vocal cords under direct vision and breath sounds checked- equal and bilateral Secured at: 23 cm Tube secured with: Tape Dental Injury: Teeth and Oropharynx as per pre-operative assessment

## 2013-05-08 NOTE — Preoperative (Signed)
Beta Blockers   Reason not to administer Beta Blockers:Not Applicable 

## 2013-05-08 NOTE — Progress Notes (Signed)
Pt awake alert  Filed Vitals:   05/08/13 1330 05/08/13 1345 05/08/13 1400 05/08/13 1440  BP:    117/61  Pulse: 69 71  69  Temp:   98 F (36.7 C) 98 F (36.7 C)  TempSrc:    Oral  Resp: 18 19  23   Height:    5\' 6"  (1.676 m)  Weight:   173 lb 4.5 oz (78.6 kg) 173 lb 4.5 oz (78.6 kg)  SpO2: 95% 94%  96%    No hematoma Neuro intact except mild left tongue deviation Some urinary retention but did just void Stable post op  Fabienne Brunsharles Caterin Tabares, MD [facility] [place] Office: [phone] Pager: [phone]

## 2013-05-08 NOTE — Progress Notes (Signed)
Bladder scan x 3 , max amt seen 

## 2013-05-08 NOTE — Anesthesia Preprocedure Evaluation (Addendum)
Anesthesia Evaluation  Patient identified by MRN, date of birth, ID band Patient awake    Reviewed: Allergy & Precautions, H&P , NPO status , Patient's Chart, lab work & pertinent test results  Airway Mallampati: II TM Distance: >3 FB Neck ROM: Full    Dental  (+) Edentulous Upper, Edentulous Lower, Dental Advidsory Given   Pulmonary neg pulmonary ROS,  breath sounds clear to auscultation  Pulmonary exam normal       Cardiovascular hypertension, Pt. on medications - angina+ Peripheral Vascular Disease Rhythm:Regular Rate:Normal  3/15 stress test: normal perfusion, normal LVF   Neuro/Psych  Headaches,  Neuromuscular disease    GI/Hepatic Neg liver ROS, GERD-  Medicated and Controlled,  Endo/Other  diabetes (glu 126, diet management)  Renal/GU negative Renal ROS     Musculoskeletal   Abdominal   Peds  Hematology   Anesthesia Other Findings Left eye blind due to traumatic injury 1992  Reproductive/Obstetrics                        Anesthesia Physical Anesthesia Plan  ASA: III  Anesthesia Plan: General   Post-op Pain Management:    Induction: Intravenous  Airway Management Planned: Oral ETT  Additional Equipment: Arterial line  Intra-op Plan:   Post-operative Plan: Extubation in OR  Informed Consent: I have reviewed the patients History and Physical, chart, labs and discussed the procedure including the risks, benefits and alternatives for the proposed anesthesia with the patient or authorized representative who has indicated his/her understanding and acceptance.   Dental Advisory Given  Plan Discussed with: CRNA and Surgeon  Anesthesia Plan Comments: (Plan routine monitors, A line, GETA)       Anesthesia Quick Evaluation

## 2013-05-09 ENCOUNTER — Telehealth: Payer: Self-pay | Admitting: Vascular Surgery

## 2013-05-09 ENCOUNTER — Encounter (HOSPITAL_COMMUNITY): Payer: Self-pay | Admitting: Vascular Surgery

## 2013-05-09 LAB — BASIC METABOLIC PANEL
BUN: 13 mg/dL (ref 6–23)
CHLORIDE: 101 meq/L (ref 96–112)
CO2: 22 mEq/L (ref 19–32)
CREATININE: 0.8 mg/dL (ref 0.50–1.35)
Calcium: 8.9 mg/dL (ref 8.4–10.5)
GFR, EST NON AFRICAN AMERICAN: 87 mL/min — AB (ref >90)
Glucose, Bld: 145 mg/dL — ABNORMAL HIGH (ref 70–99)
Potassium: 4 mEq/L (ref 3.7–5.3)
Sodium: 137 mEq/L (ref 137–147)

## 2013-05-09 LAB — URINALYSIS, ROUTINE W REFLEX MICROSCOPIC
BILIRUBIN URINE: NEGATIVE
Glucose, UA: NEGATIVE mg/dL
Hgb urine dipstick: NEGATIVE
KETONES UR: NEGATIVE mg/dL
Leukocytes, UA: NEGATIVE
NITRITE: NEGATIVE
PH: 6 (ref 5.0–8.0)
PROTEIN: NEGATIVE mg/dL
Specific Gravity, Urine: 1.01 (ref 1.005–1.030)
Urobilinogen, UA: 0.2 mg/dL (ref 0.0–1.0)

## 2013-05-09 LAB — CBC
HCT: 35.6 % — ABNORMAL LOW (ref 39.0–52.0)
Hemoglobin: 12.8 g/dL — ABNORMAL LOW (ref 13.0–17.0)
MCH: 33.7 pg (ref 26.0–34.0)
MCHC: 36 g/dL (ref 30.0–36.0)
MCV: 93.7 fL (ref 78.0–100.0)
PLATELETS: 185 10*3/uL (ref 150–400)
RBC: 3.8 MIL/uL — ABNORMAL LOW (ref 4.22–5.81)
RDW: 13.4 % (ref 11.5–15.5)
WBC: 6.3 10*3/uL (ref 4.0–10.5)

## 2013-05-09 MED ORDER — VERAPAMIL HCL 120 MG PO TABS
120.0000 mg | ORAL_TABLET | Freq: Every evening | ORAL | Status: DC
  Administered 2013-05-09: 120 mg via ORAL
  Filled 2013-05-09 (×3): qty 1

## 2013-05-09 MED ORDER — VERAPAMIL HCL ER 240 MG PO TBCR
240.0000 mg | EXTENDED_RELEASE_TABLET | Freq: Every day | ORAL | Status: DC
  Administered 2013-05-09 – 2013-05-10 (×2): 240 mg via ORAL
  Filled 2013-05-09 (×2): qty 1

## 2013-05-09 MED ORDER — CLONIDINE HCL 0.1 MG PO TABS
0.1000 mg | ORAL_TABLET | ORAL | Status: DC | PRN
  Filled 2013-05-09: qty 1

## 2013-05-09 MED ORDER — LISINOPRIL 20 MG PO TABS
30.0000 mg | ORAL_TABLET | Freq: Two times a day (BID) | ORAL | Status: DC
  Administered 2013-05-09 – 2013-05-10 (×3): 30 mg via ORAL
  Filled 2013-05-09 (×5): qty 1

## 2013-05-09 NOTE — Progress Notes (Signed)
[facility] Progress Note  05/09/2013 1:12 PM 1 Day Post-Op  Subjective:  Wants A-line out.  Still c/o frequency.  Afebrile 190 systolic HR 80's 16%98% RA  Filed Vitals:   05/09/13 1151  BP:   Pulse:   Temp: 98.6 F (37 C)  Resp:      Physical Exam: Neuro:  In tact.   Incision:  C/d/i  CBC    Component Value Date/Time   WBC 6.3 05/09/2013 0340   RBC 3.80* 05/09/2013 0340   HGB 12.8* 05/09/2013 0340   HCT 35.6* 05/09/2013 0340   PLT 185 05/09/2013 0340   MCV 93.7 05/09/2013 0340   MCH 33.7 05/09/2013 0340   MCHC 36.0 05/09/2013 0340   RDW 13.4 05/09/2013 0340    BMET    Component Value Date/Time   NA 137 05/09/2013 0340   K 4.0 05/09/2013 0340   CL 101 05/09/2013 0340   CO2 22 05/09/2013 0340   GLUCOSE 145* 05/09/2013 0340   BUN 13 05/09/2013 0340   CREATININE 0.80 05/09/2013 0340   CALCIUM 8.9 05/09/2013 0340   GFRNONAA 87* 05/09/2013 0340   GFRAA >90 05/09/2013 0340     Intake/Output Summary (Last 24 hours) at 05/09/13 1312 Last data filed at 05/09/13 1151  Gross per 24 hour  Intake 409.17 ml  Output   1870 ml  Net -1460.83 ml      Assessment/Plan:  This is a 73 y.o. male who is s/p left CEA 1 Day Post-Op  -pt is doing well neurologically.  He continues to have urinary frequency.  He is still hypertensive after receiving his regular dose of Verapamil and Lisinopril this morning.  He did receive a prn dose of hydralazine this am.  His systolic pressure has eased back up this afternoon and is receiving a dose of prn labetalol at this time. -U/A is negative -Spoke with Dr. Darrick PennaFields and he feels the hypertension is possibly related to his urinary retention.  If he continues to have frequency over the next hour, we will place the foley and leave until the morning.   He states he also feels as if he needs to have a BM.  Also, getting the A-line out may help with his BP as he is anxious about this as well. -Will add clonidine for prn coverage for his BP. -He sees Dr. Patsi Searsannenbaum  for his prostate issues and is on Cardura for this. -mobilize pt to chair    Doreatha MassedSamantha Lang Zingg, [name] [facility] [phone]

## 2013-05-09 NOTE — Progress Notes (Addendum)
[facility] Progress Note  05/09/2013 7:22 AM 1 Day Post-Op  Subjective:  States he is hurting from his arthritis, but his incision doesn't hurt  Afebrile HR 60's-80's  110's yesterday afternoon-since then, 160's-180's systolic 95% RA  Filed Vitals:   05/09/13 0520  BP: 181/81  Pulse: 60  Temp:   Resp: 14     Physical Exam: Neuro:  In tact.  Mild tongue deviation to the left.  No problems swallowing Incision:  C/d/i  CBC    Component Value Date/Time   WBC 6.3 05/09/2013 0340   RBC 3.80* 05/09/2013 0340   HGB 12.8* 05/09/2013 0340   HCT 35.6* 05/09/2013 0340   PLT 185 05/09/2013 0340   MCV 93.7 05/09/2013 0340   MCH 33.7 05/09/2013 0340   MCHC 36.0 05/09/2013 0340   RDW 13.4 05/09/2013 0340    BMET    Component Value Date/Time   NA 137 05/09/2013 0340   K 4.0 05/09/2013 0340   CL 101 05/09/2013 0340   CO2 22 05/09/2013 0340   GLUCOSE 145* 05/09/2013 0340   BUN 13 05/09/2013 0340   CREATININE 0.80 05/09/2013 0340   CALCIUM 8.9 05/09/2013 0340   GFRNONAA 87* 05/09/2013 0340   GFRAA >90 05/09/2013 0340     Intake/Output Summary (Last 24 hours) at 05/09/13 0722 Last data filed at 05/09/13 0710  Gross per 24 hour  Intake 1909.17 ml  Output   1645 ml  Net 264.17 ml      Assessment/Plan:  This is a 73 y.o. male who is s/p left CEA 1 Day Post-Op  -pt is doing well this am. -hypertensive with systolic 210's.  Verapamil/ACEI was not started back after surgery yesterday due to labile blood pressure.  I am having the RN give his lisinopril and verapamil now -pt neuro exam is in tact.  He does have slight tongue deviation to the left.   -acute surgical blood loss anemia-tolerating -pt has not ambulated-will need to ambulate once BP is better controlled. -probably home later today -pt has voided every hour   Doreatha MassedSamantha Rhyne, [name] [facility] [phone]   Some urinary frequency, and pain No hematoma Neuro intact with exception of mild tongue  deviation Some neck pain from arthritis not incisional Will check UA Potentially home later today if urinary symptoms improved Agree with BP meds above  Fabienne Brunsharles Orlyn Odonoghue, MD [facility] [place] Office: [phone] Pager: [phone]

## 2013-05-09 NOTE — Progress Notes (Signed)
Paged MD on call Regarding elevated BP (180's over 80"s) not responding to ordered prn Labetolol or Lopressor.

## 2013-05-09 NOTE — Progress Notes (Signed)
Utilization review completed.  

## 2013-05-09 NOTE — Progress Notes (Signed)
Patient bp 185/85 at 0315. Patient given 10mg  of IV labetalol. Bp came down for about an hour into the 160s.   Maralyn SagoKacee Christyan Reger, [employer]-Student Nurse

## 2013-05-09 NOTE — Progress Notes (Signed)
Bp 182/93 at 0500. Patient was given 5mg  of IV lopressor. Bp at 0526 is 187/77. Going to give patient another 10mg  of labetalol.   Maralyn SagoKacee Dragan Tamburrino, [employer]-Student Nurse

## 2013-05-09 NOTE — Progress Notes (Signed)
Pt Arterial line removed. Catheter intact. Pressure applied for 5 min. Pressure dsg applied. Pt tolerated well. Rosezella RumpfSwann, Gumecindo Hopkin D

## 2013-05-09 NOTE — Telephone Encounter (Signed)
No phone #- mailed letter, dpm

## 2013-05-09 NOTE — Telephone Encounter (Signed)
Message copied by Fredrich BirksMILLIKAN, DANA P on Thu May 09, 2013  4:31 PM ------      Message from: Phillips OdorPULLINS, CAROL S      Created: Wed May 08, 2013 11:44 AM      Regarding: Kay log; 2 wk. f/u w/ [facility]                    ----- Message -----         From: Dara LordsSamantha J Rhyne, [name]         Sent: 05/08/2013  11:34 AM           To: [facility] Charge Pool            S/p left CEA 05/08/13.  F/u in 2 weeks with Dr. Darrick PennaFields.            Thanks,      Lelon MastSamantha ------

## 2013-05-09 NOTE — Discharge Summary (Signed)
[facility] Discharge Summary  David Sellers [birth year] 72 y.o. male  [id number]  Admission Date: 05/08/2013  Discharge Date: 05/10/13  Physician: Sherren Kernsharles E Fields, MD  Admission Diagnosis: Left carotid stenosis   HPI:   This is a 73 y.o. male who presents for evaluation of asymptomatic carotid stenosis. The carotid stenosis was found on duplex ultrasound investigation for left neck pain. The patient denies any symptoms of TIA amaurosis or stroke. He apparently had some right arm weakness and 2008 but this was thought to be more to peripheral nerve impingement. He currently takes one aspirin daily. He did take a statin in the past but this was stopped for pain symptoms. He is a former smoker and quit 50 years ago. Other medical problems include hyperlipidemia, hypertension, arthritis all of which are currently stable. 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  Hospital Course:  The patient was admitted to the hospital and taken to the operating room on 05/08/2013 and underwent left carotid endarterectomy.  The pt tolerated the procedure well and was transported to the PACU in good condition.   By POD 1, the pt neuro status in tact.  He was hypertensive on POD 1.  His home BP medications were resumed.  He did require prn IV medications for his blood pressure. He was also having some urinary frequency and a u/a was sent, which was negative.  His hypertension did improve over the afternoon of POD 1 and was much better by POD 2.  His urinary frequency did improve and did not require a foley catheter.  He is on Cardura for prostate issues and is followed by Dr. Patsi Searsannenbaum.  The remainder of the hospital course consisted of increasing mobilization and increasing intake of solids without difficulty.    Recent Labs  05/09/13 0340  NA 137  K 4.0  CL 101  CO2 22  GLUCOSE 145*  BUN 13  CALCIUM 8.9     Recent Labs  05/09/13 0340  WBC 6.3  HGB 12.8*  HCT 35.6*  PLT 185   No results found for this basename: INR,  in the last 72 hours  Discharge Instructions:   The patient is discharged to home with extensive instructions on wound care and progressive ambulation.  They are instructed not to drive or perform any heavy lifting until returning to see the physician in his office.      Discharge Orders   Future Appointments Provider Department Dept Phone   05/30/2013 8:30 AM Sherren Kernsharles E Fields, MD [facility] [phone]   Future Orders Complete By Expires   Call MD for:  redness, tenderness, or signs of infection (pain, swelling, bleeding, redness, odor or green/yellow discharge around incision site)  As directed    Call MD for:  severe or increased pain, loss or decreased feeling  in affected limb(s)  As directed    Call MD for:  temperature >100.5  As directed    CAROTID Sugery: Call MD for difficulty swallowing or speaking; weakness in arms or legs that is a new symtom; severe headache.  If you have increased swelling in the neck and/or  are having difficulty breathing, CALL 911  As directed    Discharge wound care:  As directed    Comments:     Shower daily with soap and water starting 05/10/13   Driving Restrictions  As directed  Comments:     No driving for 2 weeks   Lifting restrictions  As directed    Comments:     No lifting for 2 weeks   Resume previous diet  As directed       Discharge Diagnosis:  Left carotid stenosis  Secondary Diagnosis: Patient Active Problem List   Diagnosis Date Noted  . Carotid artery stenosis 05/08/2013  . Carotid artery disease 04/17/2013  . Essential hypertension 04/17/2013  . Hyperlipidemia 04/17/2013  . Family history of coronary artery disease 04/17/2013  . Occlusion and stenosis of carotid artery without mention of cerebral infarction 04/11/2013   Past Medical History  Diagnosis Date  .  Hyperlipidemia     no meds since Jan 1  . BPH (benign prostatic hyperplasia)   . Arthritis   . Carotid artery occlusion     right ICA 50% stenosis  . Legally blind in left eye, as defined in [place]     hit by a baseball  . Carotodynia   . Hypertension     takes Verapamil,Lisinopril,and Cardura daily  . GERD (gastroesophageal reflux disease)     takes Omeprazole daily  . Asthma     only has flareup with smelly perfume   . History of bronchitis     last time 10-86yrs ago  . Headache(784.0)     r/t arthritis in neck   . Joint pain   . Low back pain     reason unknown  . Hemorrhoids   . History of colon polyps   . Urinary frequency     sees Dr.Tannenbaum  . Diabetes mellitus without complication     borderline  . Cataract     immature to the right eye      Medication List         acetaminophen 650 MG CR tablet  Commonly known as:  TYLENOL  Take 1,300 mg by mouth every 8 (eight) hours as needed for pain.     aspirin EC 81 MG tablet  Take 81 mg by mouth every morning.     doxazosin 8 MG tablet  Commonly known as:  CARDURA  Take 8 mg by mouth every evening.     hydrocortisone cream 1 %  Apply 1 application topically 2 (two) times daily as needed for itching.     KRILL OIL PO  Take 1 capsule by mouth 2 (two) times daily.     lisinopril 30 MG tablet  Commonly known as:  PRINIVIL,ZESTRIL  Take 30 mg by mouth 2 (two) times daily.     loratadine 10 MG tablet  Commonly known as:  CLARITIN  Take 10 mg by mouth every evening.     omeprazole 20 MG capsule  Commonly known as:  PRILOSEC  Take 20 mg by mouth every morning.     oxyCODONE 5 MG immediate release tablet  Commonly known as:  ROXICODONE  Take 1 tablet (5 mg total) by mouth every 6 (six) hours as needed for severe pain.     traMADol 50 MG tablet  Commonly known as:  ULTRAM  Take 50 mg by mouth 2 (two) times daily as needed for moderate pain.     TURMERIC PO  Take 1 tablet by mouth 2 (two) times daily.      verapamil 120 MG tablet  Commonly known as:  CALAN  Take 120 mg by mouth every evening.     verapamil 240 MG (CO) 24 hr tablet  Commonly known as:  COVERA HS  Take 240 mg by mouth every morning.        Roxicodone #30 No Refill  Disposition: home  Patient's condition: is Good  Follow up: 1. Dr.  Darrick Penna in 2 weeks on 05/30/13 @ 8:30am   Doreatha Massed, [name] [facility] [phone]  --- For [facility] use --- Instructions: Press F2 to tab through selections.  Delete question if not applicable.   Modified Rankin score at D/C (0-6): 0  IV medication needed for:  1. Hypertension: yes 2. Hypotension: No  Post-op Complications: Yes-urinary frequency (UA negative), hypertension  1. Post-op CVA or TIA: No  If yes: Event classification (right eye, left eye, right cortical, left cortical, verterobasilar, other): n/a  If yes: Timing of event (intra-op, <6 hrs post-op, >=6 hrs post-op, unknown): n/a  2. CN injury: No  If yes: CN n/a injuried   3. Myocardial infarction: No  If yes: Dx by (EKG or clinical, Troponin): n/a  4.  CHF: No  5.  Dysrhythmia (new): No  6. Wound infection: No  7. Reperfusion symptoms: No  8. Return to OR: No  If yes: return to OR for (bleeding, neurologic, other CEA incision, other): n/a  Discharge medications: Statin use:  No If No: [ x] For Medical reasons, [ ]  Non-compliant, [ ]  Not-indicated ASA use:  Yes  If No: [ ]  For Medical reasons, [ ]  Non-compliant, [ ]  Not-indicated Beta blocker use:  No If No: [ ]  For Medical reasons, [ ]  Non-compliant, [ ]  Not-indicated ACE-Inhibitor use:  Yes If No: [ ]  For Medical reasons, [ ]  Non-compliant, [ ]  Not-indicated P2Y12 Antagonist use: No, [ ]  Plavix, [ ]  Plasugrel, [ ]  Ticlopinine, [ ]  Ticagrelor, [ ]  Other, [ ]  No for medical reason, [ ]  Non-compliant, [ ]  Not-indicated Anti-coagulant use:  No, [ ]  Warfarin, [ ]  Rivaroxaban, [ ]  Dabigatran, [ ]  Other, [ ]  No for medical  reason, [ ]  Non-compliant, [ ]  Not-indicated

## 2013-05-10 LAB — URINE CULTURE
CULTURE: NO GROWTH
Colony Count: NO GROWTH

## 2013-05-10 NOTE — Progress Notes (Addendum)
[facility] Progress Note  05/10/2013 7:27 AM 2 Days Post-Op  Subjective:  Pt feeling much better today  100's-190's systolic.  Pt has done much better with pressure since yesterday afternoon.  Did not have to place foley catheter  Filed Vitals:   05/10/13 0330  BP: 149/71  Pulse: 77  Temp: 98.1 F (36.7 C)  Resp: 15     Physical Exam: Neuro:  In tact with mild tongue deviation to the left Incision:  C/d/i  CBC    Component Value Date/Time   WBC 6.3 05/09/2013 0340   RBC 3.80* 05/09/2013 0340   HGB 12.8* 05/09/2013 0340   HCT 35.6* 05/09/2013 0340   PLT 185 05/09/2013 0340   MCV 93.7 05/09/2013 0340   MCH 33.7 05/09/2013 0340   MCHC 36.0 05/09/2013 0340   RDW 13.4 05/09/2013 0340    BMET    Component Value Date/Time   NA 137 05/09/2013 0340   K 4.0 05/09/2013 0340   CL 101 05/09/2013 0340   CO2 22 05/09/2013 0340   GLUCOSE 145* 05/09/2013 0340   BUN 13 05/09/2013 0340   CREATININE 0.80 05/09/2013 0340   CALCIUM 8.9 05/09/2013 0340   GFRNONAA 87* 05/09/2013 0340   GFRAA >90 05/09/2013 0340     Intake/Output Summary (Last 24 hours) at 05/10/13 0727 Last data filed at 05/10/13 [id number]  Gross per 24 hour  Intake    200 ml  Output   3000 ml  Net  -2800 ml      Assessment/Plan:  This is a 73 y.o. male who is s/p left CEA 2 Days Post-Op  -pt is doing well this am.  Blood pressure is much better today.  He did not require any prn clonidine  -pt neuro exam is in tact with mild tongue deviation to the left -pt has not ambulated -pt has voided -pt needs to ambulate in hallways -discharge home after breakfast this am   Doreatha MassedSamantha Rhyne, [name] [facility] [phone] I have examined the patient, reviewed and agree with above. Comfortable. A breakfast. Walked in all without difficulty. For discharge today. Discussed with the patient and multiple family members present. Followup with Dr. Darrick Pennafields in 3 weeks Jeffren Dombek, MD 05/10/2013 9:54 AM

## 2013-05-13 LAB — GLUCOSE, CAPILLARY: Glucose-Capillary: 134 mg/dL — ABNORMAL HIGH (ref 70–99)

## 2013-05-14 ENCOUNTER — Telehealth: Payer: Self-pay

## 2013-05-14 NOTE — Telephone Encounter (Addendum)
Phone call from pt.  Reported he has had elevated BP's since yesterday; reported BP of "190"s/102" range, with several readings.  Denies any headaches, or change in vision.  Stated "other than a little incisional soreness, I feel fine."  Reported reading of "152/90" today, which stated is the lowest reading, since he noted the elevation.  Reported that his "pre-op BP's ran 130's/60's."  Discussed w/ Dr. Arbie CookeyEarly.  Recommended to contact his PCP with the elevated BP readings.  Notified pt. of Dr. Bosie HelperEarly's recommendation to call his PCP with elevated BP's.  Verb. understanding.  Agrees to call PCP.

## 2013-05-29 ENCOUNTER — Encounter: Payer: Self-pay | Admitting: Vascular Surgery

## 2013-05-30 ENCOUNTER — Encounter: Payer: Self-pay | Admitting: Vascular Surgery

## 2013-05-30 ENCOUNTER — Ambulatory Visit (INDEPENDENT_AMBULATORY_CARE_PROVIDER_SITE_OTHER): Payer: Self-pay | Admitting: Vascular Surgery

## 2013-05-30 VITALS — BP 127/62 | HR 88 | Ht 66.0 in | Wt 170.0 lb

## 2013-05-30 DIAGNOSIS — Z48812 Encounter for surgical aftercare following surgery on the circulatory system: Secondary | ICD-10-CM

## 2013-05-30 DIAGNOSIS — I6529 Occlusion and stenosis of unspecified carotid artery: Secondary | ICD-10-CM

## 2013-05-30 NOTE — Progress Notes (Signed)
Patient is a 73 year old male who returns for followup today after left carotid endarterectomy on April 1. His postoperative recovery was uneventful with the exception of the fact he has severe degenerative cervical arthritis and had some neck and head pain postoperatively. This has essentially resolved. He has had no drainage from his neck incision. Overall he feels well. He has had no symptoms of TIA amaurosis or stroke. He has a known moderate right internal carotid artery stenosis around 50%. He is on aspirin 81 mg daily.  Physical exam:  Filed Vitals:   05/30/13 0839 05/30/13 0840  BP: 139/69 127/62  Pulse: 88   Height: 5\' 6"  (1.676 m)   Weight: 170 lb (77.111 kg)   SpO2: 98%     Left neck: Healing incision  Neuro: Tongue midline symmetric upper extremity and lower extremity motor strength 5 over 5  Assessment: Doing well post left carotid endarterectomy  Plan: Followup 6 months repeat carotid duplex continue aspirin. Patient has followup with Dr. Clarene DukeLittle today regarding his blood pressure.  Fabienne Brunsharles Glynda Soliday, MD [facility] [place] Office: [phone] Pager: [phone]

## 2013-06-07 ENCOUNTER — Other Ambulatory Visit: Payer: Self-pay | Admitting: Family Medicine

## 2013-06-07 DIAGNOSIS — R0989 Other specified symptoms and signs involving the circulatory and respiratory systems: Secondary | ICD-10-CM

## 2013-08-22 NOTE — Telephone Encounter (Signed)
Encounter complete. 

## 2013-11-28 ENCOUNTER — Ambulatory Visit (HOSPITAL_COMMUNITY)
Admission: RE | Admit: 2013-11-28 | Discharge: 2013-11-28 | Disposition: A | Discharge status: Home / Self Care | Payer: Medicare Other | Source: Ambulatory Visit | Attending: Vascular Surgery | Admitting: Vascular Surgery

## 2013-11-28 DIAGNOSIS — Z48812 Encounter for surgical aftercare following surgery on the circulatory system: Secondary | ICD-10-CM | POA: Insufficient documentation

## 2013-11-28 DIAGNOSIS — I6523 Occlusion and stenosis of bilateral carotid arteries: Secondary | ICD-10-CM | POA: Diagnosis present

## 2013-12-04 ENCOUNTER — Encounter: Payer: Self-pay | Admitting: Family

## 2013-12-05 ENCOUNTER — Ambulatory Visit (INDEPENDENT_AMBULATORY_CARE_PROVIDER_SITE_OTHER): Payer: Medicare Other | Admitting: Family

## 2013-12-05 ENCOUNTER — Encounter: Payer: Self-pay | Admitting: Family

## 2013-12-05 VITALS — BP 149/82 | HR 75 | Resp 16 | Ht 66.5 in | Wt 168.3 lb

## 2013-12-05 DIAGNOSIS — I6523 Occlusion and stenosis of bilateral carotid arteries: Secondary | ICD-10-CM

## 2013-12-05 DIAGNOSIS — I6529 Occlusion and stenosis of unspecified carotid artery: Secondary | ICD-10-CM | POA: Insufficient documentation

## 2013-12-05 NOTE — Patient Instructions (Signed)
Stroke Prevention Some medical conditions and behaviors are associated with an increased chance of having a stroke. You may prevent a stroke by making healthy choices and managing medical conditions. HOW CAN I REDUCE MY RISK OF HAVING A STROKE?   Stay physically active. Get at least 30 minutes of activity on most or all days.  Do not smoke. It may also be helpful to avoid exposure to secondhand smoke.  Limit alcohol use. Moderate alcohol use is considered to be:  No more than 2 drinks per day for men.  No more than 1 drink per day for nonpregnant women.  Eat healthy foods. This involves:  Eating 5 or more servings of fruits and vegetables a day.  Making dietary changes that address high blood pressure (hypertension), high cholesterol, diabetes, or obesity.  Manage your cholesterol levels.  Making food choices that are high in fiber and low in saturated fat, trans fat, and cholesterol may control cholesterol levels.  Take any prescribed medicines to control cholesterol as directed by your health care provider.  Manage your diabetes.  Controlling your carbohydrate and sugar intake is recommended to manage diabetes.  Take any prescribed medicines to control diabetes as directed by your health care provider.  Control your hypertension.  Making food choices that are low in salt (sodium), saturated fat, trans fat, and cholesterol is recommended to manage hypertension.  Take any prescribed medicines to control hypertension as directed by your health care provider.  Maintain a healthy weight.  Reducing calorie intake and making food choices that are low in sodium, saturated fat, trans fat, and cholesterol are recommended to manage weight.  Stop drug abuse.  Avoid taking birth control pills.  Talk to your health care provider about the risks of taking birth control pills if you are over 35 years old, smoke, get migraines, or have ever had a blood clot.  Get evaluated for sleep  disorders (sleep apnea).  Talk to your health care provider about getting a sleep evaluation if you snore a lot or have excessive sleepiness.  Take medicines only as directed by your health care provider.  For some people, aspirin or blood thinners (anticoagulants) are helpful in reducing the risk of forming abnormal blood clots that can lead to stroke. If you have the irregular heart rhythm of atrial fibrillation, you should be on a blood thinner unless there is a good reason you cannot take them.  Understand all your medicine instructions.  Make sure that other conditions (such as anemia or atherosclerosis) are addressed. SEEK IMMEDIATE MEDICAL CARE IF:   You have sudden weakness or numbness of the face, arm, or leg, especially on one side of the body.  Your face or eyelid droops to one side.  You have sudden confusion.  You have trouble speaking (aphasia) or understanding.  You have sudden trouble seeing in one or both eyes.  You have sudden trouble walking.  You have dizziness.  You have a loss of balance or coordination.  You have a sudden, severe headache with no known cause.  You have new chest pain or an irregular heartbeat. Any of these symptoms may represent a serious problem that is an emergency. Do not wait to see if the symptoms will go away. Get medical help at once. Call your local emergency services (911 in [place] Do not drive yourself to the hospital. Document Released: 03/03/2004 Document Revised: 06/10/2013 Document Reviewed: 07/27/2012 [employer] Patient Information 2015 [employer]. This information is not intended to replace advice given   to you by your health care provider. Make sure you discuss any questions you have with your health care provider.  

## 2013-12-05 NOTE — Progress Notes (Signed)
Established Carotid Patient   History of Present Illness  David HookHerman W Pine is a 73 y.o. male patient of Dr. Darrick PennaFields who is s/p left carotid endarterectomy on 05/08/13 , returns today for follow up.  The patient denies any history of TIA or stroke symptoms, specifically the patient denies a history of amaurosis fugax or monocular blindness, denies a history unilateral  of facial drooping, denies a history of hemiplegia, and denies a history of receptive or expressive aphasia.   Pt denies claudication symptoms, denies non healing wounds.  The patient reports New Medical or Surgical History: had a colonoscopy.  Pt Diabetic: diet controlled Pt smoker: non-smoker  Pt meds include: Statin : No: statins causes severe arthralgias ASA: Yes Other anticoagulants/antiplatelets: no   Past Medical History  Diagnosis Date  . Hyperlipidemia     no meds since Jan 1  . BPH (benign prostatic hyperplasia)   . Arthritis   . Carotid artery occlusion     right ICA 50% stenosis  . Legally blind in left eye, as defined in [place]     hit by a baseball  . Carotodynia   . Hypertension     takes Verapamil,Lisinopril,and Cardura daily  . GERD (gastroesophageal reflux disease)     takes Omeprazole daily  . Asthma     only has flareup with smelly perfume   . History of bronchitis     last time 10-7316yrs ago  . Headache(784.0)     r/t arthritis in neck   . Joint pain   . Low back pain     reason unknown  . Hemorrhoids   . History of colon polyps   . Urinary frequency     sees Dr.Tannenbaum  . Diabetes mellitus without complication     borderline  . Cataract     immature to the right eye    Social History History  Substance Use Topics  . Smoking status: Never Smoker   . Smokeless tobacco: Never Used  . Alcohol Use: No    Family History Family History  Problem Relation Age of Onset  . Adopted: Yes  . Heart disease Father   . Heart attack Father [id number]  . Hypertension Father   . Diabetes Daughter    . Hyperlipidemia Daughter   . Hypertension Daughter   . Diabetes Daughter   . Hyperlipidemia Daughter   . Hypertension Daughter     Surgical History Past Surgical History  Procedure Laterality Date  . Retinal laser surgery Left     plate placed  . Hernia repair      x 2  . Retinal detachment surgery    . Egd with dilitation    . Colonoscopy    . Endarterectomy Left 05/08/2013    Procedure: LEFT CAROTID ARTERY ENDARTERECTOMY WITH DACRON PATCH ANGIOPLASTY;  Surgeon: Sherren Kernsharles E Fields, MD;  [facility];  Service: Vascular;  Laterality: Left;  . Carotid endarterectomy Left 05/08/13    Allergies  Allergen Reactions  . Statins Other (See Comments)    Severe arthritic response  . Atorvastatin     Severe myalgias  . Darvon [Propoxyphene] Other (See Comments)    hallucinate  . Indomethacin     Neck pain flare  . Penicillins     *positive allergy test*  . Pravastatin     myalgias  . Sulfa Antibiotics Nausea And Vomiting    Current Outpatient Prescriptions  Medication Sig Dispense Refill  . acetaminophen (TYLENOL) 650 MG CR tablet Take 1,300 mg by  mouth every 8 (eight) hours as needed for pain.      Marland Kitchen aspirin EC 81 MG tablet Take 81 mg by mouth every morning.      . cetirizine (ZYRTEC) 10 MG tablet Take 10 mg by mouth daily.      Marland Kitchen doxazosin (CARDURA) 8 MG tablet Take 8 mg by mouth every evening.       Marland Kitchen KRILL OIL PO Take 1 capsule by mouth 2 (two) times daily.       Marland Kitchen lisinopril (PRINIVIL,ZESTRIL) 30 MG tablet Take 30 mg by mouth 2 (two) times daily.       Marland Kitchen omeprazole (PRILOSEC) 20 MG capsule Take 20 mg by mouth every morning.      . Red Yeast Rice 600 MG CAPS Take 1 capsule by mouth 2 (two) times daily.      . traMADol (ULTRAM) 50 MG tablet Take 50 mg by mouth 2 (two) times daily as needed for moderate pain.       . TURMERIC PO Take 1 tablet by mouth 2 (two) times daily.       . verapamil (CALAN) 120 MG tablet Take 120 mg by mouth every evening.      . verapamil (COVERA  HS) 240 MG (CO) 24 hr tablet Take 240 mg by mouth every morning.      . hydrocortisone cream 1 % Apply 1 application topically 2 (two) times daily as needed for itching.      . loratadine (CLARITIN) 10 MG tablet Take 10 mg by mouth every evening.      Marland Kitchen oxyCODONE (ROXICODONE) 5 MG immediate release tablet Take 1 tablet (5 mg total) by mouth every 6 (six) hours as needed for severe pain.  30 tablet  0   No current facility-administered medications for this visit.    Review of Systems : See HPI for pertinent positives and negatives.  Physical Examination  Filed Vitals:   12/05/13 1341 12/05/13 1342  BP: 131/84 149/82  Pulse: 75   Resp: 16   Height: 5' 6.5" (1.689 m)   Weight: 168 lb 4.8 oz (76.34 kg)    Body mass index is 26.76 kg/(m^2).  General: WDWN male in NAD GAIT: normal Eyes: PERRLA Pulmonary:  Non-labored, CTAB, Negative  Rales, Negative rhonchi, & Negative wheezing.  Cardiac: regular Rhythm,  Negative detected murmur.  VASCULAR EXAM Carotid Bruits Right Left   Negative Negative    Radial pulses are 2+ palpable and equal.                                                                                                                            LE Pulses Right Left       POPLITEAL  not palpable   not palpable       POSTERIOR TIBIAL   palpable    palpable        DORSALIS PEDIS      ANTERIOR TIBIAL  palpable  palpable     Gastrointestinal: soft, nontender, BS WNL, no r/g,  negative palpated masses.  Musculoskeletal: Negative muscle atrophy/wasting. M/S 5/5 throughout, Extremities without ischemic changes.  Neurologic: A&O X 3; Appropriate Affect ; SENSATION ;normal;  Speech is normal CN 2-12 intact, Pain and light touch intact in extremities, Motor exam as listed above.   Non-Invasive Vascular Imaging CAROTID DUPLEX (11/28/13)  CEREBROVASCULAR DUPLEX EVALUATION    INDICATION: Carotid endarterectomy     PREVIOUS INTERVENTION(S): Left carotid  endarterectomy on 05/08/13    DUPLEX EXAM:     RIGHT  LEFT  Peak Systolic Velocities (cm/s) End Diastolic Velocities (cm/s) Plaque LOCATION Peak Systolic Velocities (cm/s) End Diastolic Velocities (cm/s) Plaque  74 14  CCA PROXIMAL 77 16   78 18 HT CCA MID 103 20 HM  68 19 HT CCA DISTAL 86 16   126 12 HT ECA 84 16   106 29 CP ICA PROXIMAL 63 23   70 22  ICA MID 55 20   76 28  ICA DISTAL 57 22     1.6 ICA / CCA Ratio (PSV) Not Calculated  Antegrade Vertebral Flow Antegrade  162 Brachial Systolic Pressure (mmHg) 156  Multiphasic (subclavian artery) Brachial Artery Waveforms Multiphasic (subclavian artery)    Plaque Morphology:  HM = Homogeneous, HT = Heterogeneous, CP = Calcific Plaque, SP = Smooth Plaque, IP = Irregular Plaque     ADDITIONAL FINDINGS:   No significant stenosis of the bilateral external or common carotid arteries.   Limited visualization of the right proximal internal carotid artery due to a roughly 2.5cm segment of calcific plaque.    IMPRESSION: 1. Patent left carotid endarterectomy site with no left internal carotid artery stenosis. 2. Doppler velocities suggest a less than 40% stenosis of the right proximal internal carotid artery however velocities may be underestimated due to calcific shadowing, as described above.    Compared to the previous exam:  Significant improvement of the left internal carotid artery when compared to the previous exam on 04/11/13.       Assessment: David Sellers is a 73 y.o. male who presents with asymptomatic patent left carotid endarterectomy site with no left internal carotid artery stenosis and  less than 40% stenosis of the right proximal internal carotid artery however velocities may be underestimated due to calcific shadowing, as described above. Significant improvement of the left internal carotid artery when compared to the previous exam on 04/11/13.   Plan: Follow-up in 6 months with Carotid Duplex scan.   I discussed in  depth with the patient the nature of atherosclerosis, and emphasized the importance of maximal medical management including strict control of blood pressure, blood glucose, and lipid levels, obtaining regular exercise, and continued cessation of smoking.  The patient is aware that without maximal medical management the underlying atherosclerotic disease process will progress, limiting the benefit of any interventions. The patient was given information about stroke prevention and what symptoms should prompt the patient to seek immediate medical care. Thank you for allowing us to participate in this patient's care.  Charisse MarchSuzanne Onyekachi Gathright, RN, MSN, FNP-C [facility] [place] Office: [phone]  Clinic Physician: Darrick PennaFields  12/05/2013 2:04 PM

## 2013-12-09 NOTE — Addendum Note (Signed)
Addended by: Sharee PimpleMCCHESNEY, Yaa Donnellan K on: 12/09/2013 11:22 AM   Modules accepted: Orders

## 2014-04-25 ENCOUNTER — Encounter: Payer: Self-pay | Admitting: Cardiovascular Disease

## 2014-04-25 ENCOUNTER — Ambulatory Visit (INDEPENDENT_AMBULATORY_CARE_PROVIDER_SITE_OTHER): Payer: Medicare Other | Admitting: Cardiovascular Disease

## 2014-04-25 VITALS — BP 126/68 | HR 77 | Ht 65.0 in | Wt 171.7 lb

## 2014-04-25 DIAGNOSIS — I10 Essential (primary) hypertension: Secondary | ICD-10-CM

## 2014-04-25 DIAGNOSIS — E785 Hyperlipidemia, unspecified: Secondary | ICD-10-CM

## 2014-04-25 DIAGNOSIS — I6522 Occlusion and stenosis of left carotid artery: Secondary | ICD-10-CM

## 2014-04-25 NOTE — Progress Notes (Signed)
04/25/2014 David Sellers   [birth year]  [id number]  Primary Physician Mickie Hillier, MD Primary Cardiologist: Runell Gess MD Roseanne Reno   HPI:   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 Fabienne Bruns for cardiovascular evaluation and preoperative screening prior to elective left carotid endarterectomy. I last saw him one year ago. His cardiovascular factor profile is positive for hypertension, dyslipidemia, and family history of heart disease with a father who died of a myocardial infarction in his 34s. He has never had a heart attack or stroke. He denies chest pain or shortness of breath.  He did have pain in his left neck and had incidentally found moderate left internal carotid artery stenosis. A Myoview stress test performed for preoperative clearance 04/23/13 was entirely normal. He underwent uncomplicated left carotid endarterectomy by Dr. Darrick Penna on 05/08/13 with excellent operative result although the patient does have some left facial numbness as result.   Current Outpatient Prescriptions  Medication Sig Dispense Refill  . acetaminophen (TYLENOL) 650 MG CR tablet Take 1,300 mg by mouth every 8 (eight) hours as needed for pain.    Marland Kitchen aspirin EC 81 MG tablet Take 81 mg by mouth every morning.    . cetirizine (ZYRTEC) 10 MG tablet Take 10 mg by mouth daily.    Marland Kitchen doxazosin (CARDURA) 8 MG tablet Take 8 mg by mouth every evening.     . hydrochlorothiazide (MICROZIDE) 12.5 MG capsule Take 12.5 mg by mouth daily.    Marland Kitchen KRILL OIL PO Take 1 capsule by mouth 2 (two) times daily.     Marland Kitchen lisinopril (PRINIVIL,ZESTRIL) 30 MG tablet Take 1 tablet by mouth 2 (two) times daily.  1  . lovastatin (MEVACOR) 20 MG tablet Take 20 mg by mouth at bedtime.    Marland Kitchen omeprazole (PRILOSEC) 20 MG capsule Take 20 mg by mouth every morning.    . traMADol (ULTRAM) 50 MG tablet Take 50 mg by mouth 2  (two) times daily as needed for moderate pain.     . TURMERIC PO Take 1 tablet by mouth 2 (two) times daily.     . verapamil (CALAN) 120 MG tablet Take 120 mg by mouth every evening.    . verapamil (COVERA HS) 240 MG (CO) 24 hr tablet Take 240 mg by mouth every morning.     No current facility-administered medications for this visit.    Allergies  Allergen Reactions  . Statins Other (See Comments)    Severe arthritic response  . Atorvastatin     Severe myalgias  . Darvon [Propoxyphene] Other (See Comments)    hallucinate  . Indomethacin     Neck pain flare  . Penicillins     *positive allergy test*  . Pravastatin     myalgias  . Sulfa Antibiotics Nausea And Vomiting    History   Social History  . Marital Status: Married    Spouse Name: N/A  . Number of Children: N/A  . Years of Education: N/A   Occupational History  . Not on file.   Social History Main Topics  . Smoking status: Never Smoker   . Smokeless tobacco: Never Used  . Alcohol Use: No  . Drug Use: No  . Sexual Activity: Yes   Other Topics Concern  . Not on file   Social History Narrative     Review of Systems: General: negative for chills,  fever, night sweats or weight changes.  Cardiovascular: negative for chest pain, dyspnea on exertion, edema, orthopnea, palpitations, paroxysmal nocturnal dyspnea or shortness of breath Dermatological: negative for rash Respiratory: negative for cough or wheezing Urologic: negative for hematuria Abdominal: negative for nausea, vomiting, diarrhea, bright red blood per rectum, melena, or hematemesis Neurologic: negative for visual changes, syncope, or dizziness All other systems reviewed and are otherwise negative except as noted above.    Blood pressure 126/68, pulse 77, height 5\' 5"  (1.651 m), weight 171 lb 11.2 oz (77.883 kg).  General appearance: alert and no distress Neck: no adenopathy, no carotid bruit, no JVD, supple, symmetrical, trachea midline and  thyroid not enlarged, symmetric, no tenderness/mass/nodules Lungs: clear to auscultation bilaterally Heart: regular rate and rhythm, S1, S2 normal, no murmur, click, rub or gallop Extremities: extremities normal, atraumatic, no cyanosis or edema  EKG normal sinus rhythm at 77 without ST or T-wave changes. I personally reviewed this EKG  ASSESSMENT AND PLAN:   Hyperlipidemia History of hyperlipidemia lovastatin 20 mg a day followed by his PCP   Essential hypertension History of hypertension blood pressure measured today 126/68. He is on lisinopril, hydrochlorothiazide and verapamil. Continue current meds at current dosing   Carotid artery stenosis History of carotid artery disease status post elective left internal carotid artery endarterectomy performed by Dr. Darrick PennaFields 05/08/13. Dr. Darrick PennaFields is following this by duplex ultrasound.       Runell GessJonathan J. Maryuri Warnke MD FACP,FACC,FAHA, [facility] 04/25/2014 2:04 PM

## 2014-04-25 NOTE — Assessment & Plan Note (Signed)
History of hypertension blood pressure measured today 126/68. He is on lisinopril, hydrochlorothiazide and verapamil. Continue current meds at current dosing

## 2014-04-25 NOTE — Patient Instructions (Signed)
Dr Berry recommends that you schedule a follow-up appointment in 1 year. You will receive a reminder letter in the mail two months in advance. If you don't receive a letter, please call our office to schedule the follow-up appointment. 

## 2014-04-25 NOTE — Assessment & Plan Note (Signed)
History of hyperlipidemia lovastatin 20 mg a day followed by his PCP

## 2014-04-25 NOTE — Assessment & Plan Note (Addendum)
History of carotid artery disease status post elective left internal carotid artery endarterectomy performed by Dr. Darrick PennaFields 05/08/13. Dr. Darrick PennaFields is following this by duplex ultrasound.

## 2014-05-07 ENCOUNTER — Encounter: Payer: Self-pay | Admitting: Cardiovascular Disease

## 2014-06-12 ENCOUNTER — Ambulatory Visit: Payer: Medicare Other | Admitting: Family

## 2014-06-12 ENCOUNTER — Other Ambulatory Visit (HOSPITAL_COMMUNITY): Payer: Medicare Other

## 2014-06-26 ENCOUNTER — Encounter: Payer: Self-pay | Admitting: Family

## 2014-06-27 ENCOUNTER — Encounter: Payer: Self-pay | Admitting: Family

## 2014-06-27 ENCOUNTER — Ambulatory Visit (INDEPENDENT_AMBULATORY_CARE_PROVIDER_SITE_OTHER): Payer: Medicare Other | Admitting: Family

## 2014-06-27 ENCOUNTER — Ambulatory Visit (HOSPITAL_COMMUNITY)
Admission: RE | Admit: 2014-06-27 | Discharge: 2014-06-27 | Disposition: A | Discharge status: Home / Self Care | Payer: Medicare Other | Source: Ambulatory Visit | Attending: Family | Admitting: Family

## 2014-06-27 VITALS — BP 146/81 | HR 66 | Resp 16 | Ht 66.0 in | Wt 169.0 lb

## 2014-06-27 DIAGNOSIS — Z9889 Other specified postprocedural states: Secondary | ICD-10-CM | POA: Diagnosis not present

## 2014-06-27 DIAGNOSIS — Z48812 Encounter for surgical aftercare following surgery on the circulatory system: Secondary | ICD-10-CM

## 2014-06-27 DIAGNOSIS — I6523 Occlusion and stenosis of bilateral carotid arteries: Secondary | ICD-10-CM | POA: Insufficient documentation

## 2014-06-27 NOTE — Progress Notes (Signed)
Filed Vitals:   06/27/14 1427 06/27/14 1430 06/27/14 1441 06/27/14 1444  BP: 158/90 162/84 161/81 146/81  Pulse: 66 66 69 66  Resp:  16    Height:  5\' 6"  (1.676 m)    Weight:  169 lb (76.658 kg)    SpO2:  98%

## 2014-06-27 NOTE — Progress Notes (Signed)
Established Carotid Patient   History of Present Illness  David HookHerman W Losier is a 74 y.o. male patient of Dr. Darrick PennaFields who is s/p left carotid endarterectomy on 05/08/13 , returns today for follow up.  The patient denies any history of TIA or stroke symptoms, specifically the patient denies a history of amaurosis fugax or monocular blindness, denies a history unilateral of facial drooping, denies a history of hemiplegia, and denies a history of receptive or expressive aphasia.  Pt denies claudication symptoms, denies non healing wounds.  The patient reports New Medical or Surgical History: arthritis is worse, NSAID tried and his blood pressure increased, has started to decrease since he stopped the NSAID 06/15/14.  Pt Diabetic: diet controlled Pt smoker: non-smoker  Pt meds include: Statin : yes ASA: Yes Other anticoagulants/antiplatelets: no  Past Medical History  Diagnosis Date  . Hyperlipidemia     no meds since Jan 1  . BPH (benign prostatic hyperplasia)   . Arthritis   . Carotid artery occlusion     right ICA 50% stenosis  . Legally blind in left eye, as [place] in [place]     hit by a baseball  . Carotodynia   . Hypertension     takes Verapamil,Lisinopril,and Cardura daily  . GERD (gastroesophageal reflux disease)     takes Omeprazole daily  . Asthma     only has flareup with smelly perfume   . History of bronchitis     last time 10-3538yrs ago  . Headache(784.0)     r/t arthritis in neck   . Joint pain   . Low back pain     reason unknown  . Hemorrhoids   . History of colon polyps   . Urinary frequency     sees Dr.Tannenbaum  . Diabetes mellitus without complication     borderline  . Cataract     immature to the right eye    Social History History  Substance Use Topics  . Smoking status: Never Smoker   . Smokeless tobacco: Never Used  . Alcohol Use: No    Family History Family History  Problem Relation Age of Onset  . Adopted: Yes  . Heart disease Father    . Heart attack Father 360  . Hypertension Father   . Diabetes Daughter   . Hyperlipidemia Daughter   . Hypertension Daughter   . Diabetes Daughter   . Hyperlipidemia Daughter   . Hypertension Daughter     Surgical History Past Surgical History  Procedure Laterality Date  . Retinal laser surgery Left     plate placed  . Hernia repair      x 2  . Retinal detachment surgery    . Egd with dilitation    . Colonoscopy    . Endarterectomy Left 05/08/2013    Procedure: LEFT CAROTID ARTERY ENDARTERECTOMY WITH DACRON PATCH ANGIOPLASTY;  Surgeon: Sherren Kernsharles E Fields, MD;  [facility];  Service: Vascular;  Laterality: Left;  . Carotid endarterectomy Left 05/08/13    Allergies  Allergen Reactions  . Statins Other (See Comments)    Severe arthritic response  . Atorvastatin     Severe myalgias  . Darvon [Propoxyphene] Other (See Comments)    hallucinate  . Indomethacin     Neck pain flare  . Penicillins     *positive allergy test*  . Pravastatin     myalgias  . Sulfa Antibiotics Nausea And Vomiting    Current Outpatient Prescriptions  Medication Sig Dispense Refill  .  acetaminophen (TYLENOL) 650 MG CR tablet Take 1,300 mg by mouth every 8 (eight) hours as needed for pain.    Marland Kitchen aspirin EC 81 MG tablet Take 81 mg by mouth every morning.    . cetirizine (ZYRTEC) 10 MG tablet Take 10 mg by mouth daily.    Marland Kitchen doxazosin (CARDURA) 8 MG tablet Take 8 mg by mouth every evening.     . hydrochlorothiazide (MICROZIDE) 12.5 MG capsule Take 12.5 mg by mouth daily.    Marland Kitchen KRILL OIL PO Take 1 capsule by mouth 2 (two) times daily.     Marland Kitchen lisinopril (PRINIVIL,ZESTRIL) 30 MG tablet Take 1 tablet by mouth 2 (two) times daily.  1  . lovastatin (MEVACOR) 20 MG tablet Take 20 mg by mouth at bedtime.    Marland Kitchen omeprazole (PRILOSEC) 20 MG capsule Take 20 mg by mouth every morning.    . traMADol (ULTRAM) 50 MG tablet Take 50 mg by mouth 2 (two) times daily as needed for moderate pain.     . TURMERIC PO Take 1  tablet by mouth 2 (two) times daily.     . verapamil (CALAN) 120 MG tablet Take 120 mg by mouth every evening.    . verapamil (COVERA HS) 240 MG (CO) 24 hr tablet Take 240 mg by mouth every morning.     No current facility-administered medications for this visit.    Review of Systems : See HPI for pertinent positives and negatives.  Physical Examination  Filed Vitals:   06/27/14 1427 06/27/14 1430 06/27/14 1441 06/27/14 1444  BP: 158/90 162/84 161/81 146/81  Pulse: 66 66 69 66  Resp:  16    Height:   (1.676 m)    Weight:  169 lb (76.658 kg)    SpO2:  98%     Body mass index is 27.29 kg/(m^2).   General: WDWN male in NAD GAIT: normal Eyes: PERRLA Pulmonary: Non-labored, CTAB, Negative Rales, Negative rhonchi, & Negative wheezing.  Cardiac: regular Rhythm, no detected murmur.  VASCULAR EXAM Carotid Bruits Right Left   Negative Negative   Radial pulses are 2+ palpable and equal.      LE Pulses Right Left   POPLITEAL not palpable  not palpable   POSTERIOR TIBIAL  palpable   palpable    DORSALIS PEDIS  ANTERIOR TIBIAL not palpable  not palpable     Gastrointestinal: soft, nontender, BS WNL, no r/g,no palpated masses.  Musculoskeletal: Negative muscle atrophy/wasting. M/S 5/5 throughout, Extremities without ischemic changes.  Neurologic: A&O X 3; Appropriate Affect ; SENSATION ;normal;  Speech is normal CN 2-12 intact, Pain and light touch intact in extremities, Motor exam as listed above.          Non-Invasive Vascular Imaging CAROTID DUPLEX 06/27/2014   CEREBROVASCULAR DUPLEX EVALUATION    INDICATION: Carotid artery disease     PREVIOUS INTERVENTION(S): Left carotid endarterectomy 05/08/2013    DUPLEX EXAM:     RIGHT  LEFT  Peak Systolic Velocities (cm/s) End Diastolic  Velocities (cm/s) Plaque LOCATION Peak Systolic Velocities (cm/s) End Diastolic Velocities (cm/s) Plaque  68 12  CCA PROXIMAL 54 9   72 14 HT CCA MID 111 17 HM  76 14 HT CCA DISTAL 77 15   112 7 HT ECA 84 6   63 9 CP ICA PROXIMAL 49 12   56 16  ICA MID 50 15   87 25  ICA DISTAL 58 17     1.20 ICA / CCA Ratio (PSV) NA  Antegrade  Vertebral Flow Antegrade   150 Brachial Systolic Pressure (mmHg) 156  Multiphasic (Subclavian artery) Brachial Artery Waveforms Multiphasic (Subclavian artery)    Plaque Morphology:  HM = Homogeneous, HT = Heterogeneous, CP = Calcific Plaque, SP = Smooth Plaque, IP = Irregular Plaque  ADDITIONAL FINDINGS: Limited visualization of the right proximal internal carotid artery due to a roughly 2.6 cm segment of calcified plaque.    IMPRESSION: Right internal carotid artery velocities suggest a 1-49% stenosis; however, velocities may be underestimated due to calcific shadowing as described above.  Patent left carotid endarterectomy site with no evidence of hyperplasia or restenosis.     Compared to the previous exam:  No significant change in comparison to the last exam on 11/28/2013.      Assessment: David Sellers is a 74 y.o. male who is s/p left carotid endarterectomy on 05/08/13. He has no history of stroke or TIA.  Today's carotid Duplex suggests 1-49% right ICA stenosis and patent left carotid endarterectomy site with no evidence of hyperplasia or restenosis.  Limited visualization of the right proximal internal carotid artery due to a roughly 2.6 cm segment of calcified plaque. No significant change in comparison to the last exam on 11/28/2013.   Plan: Follow-up in 1 year with Carotid Duplex.   I discussed in depth with the patient the nature of atherosclerosis, and emphasized the importance of maximal medical management including strict control of blood pressure, blood glucose, and lipid levels, obtaining regular exercise, and continued cessation of  smoking.  The patient is aware that without maximal medical management the underlying atherosclerotic disease process will progress, limiting the benefit of any interventions. The patient was given information about stroke prevention and what symptoms should prompt the patient to seek immediate medical care. Thank you for allowing us to participate in this patient's care.  Charisse MarchSuzanne Glenette Bookwalter, RN, MSN, FNP-C [facility] [place] Office: [phone]  Clinic Physician: Imogene BurnChen  06/27/2014 2:30 PM

## 2014-06-27 NOTE — Addendum Note (Signed)
Addended by: Adria DillELDRIDGE-LEWIS, Keyvin Rison L on: 06/27/2014 03:24 PM   Modules accepted: Orders

## 2014-06-27 NOTE — Patient Instructions (Signed)
Stroke Prevention Some medical conditions and behaviors are associated with an increased chance of having a stroke. You may prevent a stroke by making healthy choices and managing medical conditions. HOW CAN I REDUCE MY RISK OF HAVING A STROKE?   Stay physically active. Get at least 30 minutes of activity on most or all days.  Do not smoke. It may also be helpful to avoid exposure to secondhand smoke.  Limit alcohol use. Moderate alcohol use is considered to be:  No more than 2 drinks per day for men.  No more than 1 drink per day for nonpregnant women.  Eat healthy foods. This involves:  Eating 5 or more servings of fruits and vegetables a day.  Making dietary changes that address high blood pressure (hypertension), high cholesterol, diabetes, or obesity.  Manage your cholesterol levels.  Making food choices that are high in fiber and low in saturated fat, trans fat, and cholesterol may control cholesterol levels.  Take any prescribed medicines to control cholesterol as directed by your health care provider.  Manage your diabetes.  Controlling your carbohydrate and sugar intake is recommended to manage diabetes.  Take any prescribed medicines to control diabetes as directed by your health care provider.  Control your hypertension.  Making food choices that are low in salt (sodium), saturated fat, trans fat, and cholesterol is recommended to manage hypertension.  Take any prescribed medicines to control hypertension as directed by your health care provider.  Maintain a healthy weight.  Reducing calorie intake and making food choices that are low in sodium, saturated fat, trans fat, and cholesterol are recommended to manage weight.  Stop drug abuse.  Avoid taking birth control pills.  Talk to your health care provider about the risks of taking birth control pills if you are over 35 years old, smoke, get migraines, or have ever had a blood clot.  Get evaluated for sleep  disorders (sleep apnea).  Talk to your health care provider about getting a sleep evaluation if you snore a lot or have excessive sleepiness.  Take medicines only as directed by your health care provider.  For some people, aspirin or blood thinners (anticoagulants) are helpful in reducing the risk of forming abnormal blood clots that can lead to stroke. If you have the irregular heart rhythm of atrial fibrillation, you should be on a blood thinner unless there is a good reason you cannot take them.  Understand all your medicine instructions.  Make sure that other conditions (such as anemia or atherosclerosis) are addressed. SEEK IMMEDIATE MEDICAL CARE IF:   You have sudden weakness or numbness of the face, arm, or leg, especially on one side of the body.  Your face or eyelid droops to one side.  You have sudden confusion.  You have trouble speaking (aphasia) or understanding.  You have sudden trouble seeing in one or both eyes.  You have sudden trouble walking.  You have dizziness.  You have a loss of balance or coordination.  You have a sudden, severe headache with no known cause.  You have new chest pain or an irregular heartbeat. Any of these symptoms may represent a serious problem that is an emergency. Do not wait to see if the symptoms will go away. Get medical help at once. Call your local emergency services (911 in [place] Do not drive yourself to the hospital. Document Released: 03/03/2004 Document Revised: 06/10/2013 Document Reviewed: 07/27/2012 [employer] Patient Information 2015 [employer]. This information is not intended to replace advice given   to you by your health care provider. Make sure you discuss any questions you have with your health care provider.  

## 2014-08-04 ENCOUNTER — Other Ambulatory Visit: Payer: Self-pay

## 2015-02-24 DIAGNOSIS — R972 Elevated prostate specific antigen [PSA]: Secondary | ICD-10-CM | POA: Diagnosis not present

## 2015-02-24 DIAGNOSIS — Z0000 Encounter for general adult medical examination without abnormal findings: Secondary | ICD-10-CM | POA: Diagnosis not present

## 2015-02-24 DIAGNOSIS — R351 Nocturia: Secondary | ICD-10-CM | POA: Diagnosis not present

## 2015-02-24 DIAGNOSIS — N401 Enlarged prostate with lower urinary tract symptoms: Secondary | ICD-10-CM | POA: Diagnosis not present

## 2015-03-16 DIAGNOSIS — H2511 Age-related nuclear cataract, right eye: Secondary | ICD-10-CM | POA: Diagnosis not present

## 2015-03-16 DIAGNOSIS — H5211 Myopia, right eye: Secondary | ICD-10-CM | POA: Diagnosis not present

## 2015-03-16 DIAGNOSIS — E119 Type 2 diabetes mellitus without complications: Secondary | ICD-10-CM | POA: Diagnosis not present

## 2015-05-07 ENCOUNTER — Encounter: Payer: Self-pay | Admitting: Cardiology

## 2015-06-23 ENCOUNTER — Encounter: Payer: Self-pay | Admitting: Family

## 2015-06-26 ENCOUNTER — Encounter: Payer: Self-pay | Admitting: Cardiovascular Disease

## 2015-06-26 ENCOUNTER — Ambulatory Visit (INDEPENDENT_AMBULATORY_CARE_PROVIDER_SITE_OTHER): Payer: PPO | Admitting: Cardiovascular Disease

## 2015-06-26 VITALS — BP 124/70 | HR 61 | Ht 66.0 in | Wt 162.0 lb

## 2015-06-26 DIAGNOSIS — E785 Hyperlipidemia, unspecified: Secondary | ICD-10-CM

## 2015-06-26 DIAGNOSIS — I10 Essential (primary) hypertension: Secondary | ICD-10-CM

## 2015-06-26 NOTE — Progress Notes (Signed)
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 Fabienne Bruns for cardiovascular evaluation and preoperative screening prior to elective left carotid endarterectomy. I last saw him in the office 04/25/14. His cardiovascular factor profile is positive for hypertension, dyslipidemia, and family history of heart disease with a father who died of a myocardial infarction in his 93s. He has never had a heart attack or stroke. He denies chest pain or shortness of breath. He did have pain in his left neck and had incidentally found moderate left internal carotid artery stenosis. A Myoview stress test performed for preoperative clearance 04/23/13 was entirely normal. He underwent uncomplicated left carotid endarterectomy by Dr. Darrick Penna on 05/08/13 with excellent operative result although the patient does have some left facial numbness as result. SinceI saw him a year ago he has remained stable.   Current Outpatient Prescriptions  Medication Sig Dispense Refill  . acetaminophen (TYLENOL) 650 MG CR tablet Take 1,300 mg by mouth every 8 (eight) hours as needed for pain.    Marland Kitchen aspirin EC 81 MG tablet Take 81 mg by mouth every morning.    . cetirizine (ZYRTEC) 10 MG tablet Take 10 mg by mouth daily.    Marland Kitchen doxazosin (CARDURA) 8 MG tablet Take 8 mg by mouth every evening.     . hydrochlorothiazide (MICROZIDE) 12.5 MG capsule Take 6.25 mg by mouth daily.     Marland Kitchen KRILL OIL PO Take 1 capsule by mouth 2 (two) times daily.     Marland Kitchen lisinopril (PRINIVIL,ZESTRIL) 30 MG tablet Take 1 tablet by mouth 2 (two) times daily.  1  . loratadine (CLARITIN) 10 MG tablet Take 10 mg by mouth daily.    Marland Kitchen lovastatin (MEVACOR) 20 MG tablet Take 20 mg by mouth at bedtime.    Marland Kitchen  omeprazole (PRILOSEC) 20 MG capsule Take 20 mg by mouth every morning.    . verapamil (CALAN) 120 MG tablet Take 240 mg by mouth every evening.     . verapamil (COVERA HS) 240 MG (CO) 24 hr tablet Take 240 mg by mouth every morning.     No current facility-administered medications for this visit.    Allergies  Allergen Reactions  . Meloxicam Itching, Palpitations and Other (See Comments)    Headaches, Mood swings.  . Morphine And Related Nausea And Vomiting  . Statins Other (See Comments)    Severe arthritic response  . Cholestoff [Plant Sterols And Stanols] Other (See Comments)    Muscle aches  . Nsaids Other (See Comments)    Aleve ,  Myalgia  . Atorvastatin     Severe myalgias  . Darvon [Propoxyphene] Other (See Comments)    hallucinate  . Indomethacin     Neck pain flare  . Penicillins     *positive allergy test*  . Pravastatin     myalgias  . Sulfa Antibiotics Nausea And Vomiting    Social History   Social History  . Marital Status: Married    Spouse Name: N/A  . Number of Children: N/A  . Years of Education: N/A   Occupational History  . Not on file.   Social History Main Topics  . Smoking status: Never Smoker   . Smokeless tobacco: Never Used  . Alcohol  Use: No  . Drug Use: No  . Sexual Activity: Yes   Other Topics Concern  . Not on file   Social History Narrative     Review of Systems: General: negative for chills, fever, night sweats or weight changes.  Cardiovascular: negative for chest pain, dyspnea on exertion, edema, orthopnea, palpitations, paroxysmal nocturnal dyspnea or shortness of breath Dermatological: negative for rash Respiratory: negative for cough or wheezing Urologic: negative for hematuria Abdominal: negative for nausea, vomiting, diarrhea, bright red blood per rectum, melena, or hematemesis Neurologic: negative for visual changes, syncope, or dizziness All other systems reviewed and are otherwise negative except as noted  above.    Blood pressure 124/70, pulse 61, height 5\' 6"  (1.676 m), weight 162 lb (73.483 kg), SpO2 98 %.  General appearance: alert and no distress Neck: no adenopathy, no carotid bruit, no JVD, supple, symmetrical, trachea midline and thyroid not enlarged, symmetric, no tenderness/mass/nodules Lungs: clear to auscultation bilaterally Heart: regular rate and rhythm, S1, S2 normal, no murmur, click, rub or gallop Extremities: extremities normal, atraumatic, no cyanosis or edema  EKG normal sinus rhythm at 61 without ST or T-wave changes. I personally reviewed this EKG  ASSESSMENT AND PLAN:   Occlusion and stenosis of carotid artery without mention of cerebral infarction History of carotid artery disease status post elective uncomplicated left carotid endarterectomy by Dr. Darrick PennaFields 05/08/13 which he follows  Essential hypertension History of hypertension blood pressure measured 124/70. He is on lisinopril , hydrochlorothiazide and verapamil. Continue current meds at current dosing  Hyperlipidemia History of hyperlipidemia on statin therapy followed by his PCP      Runell GessJonathan J. Tollie Canada MD [facility] Brookside CampusFACP,FACC,FAHA, Doctors Memorial HospitalFSCAI 06/26/2015 9:50 AM

## 2015-06-26 NOTE — Assessment & Plan Note (Addendum)
History of hyperlipidemia on statin therapy followed by his PCP. His most recent lipid profile performed 03/10/14 revealed total cholesterol 150, LDL 76 and HDL 37.

## 2015-06-26 NOTE — Assessment & Plan Note (Signed)
History of carotid artery disease status post elective uncomplicated left carotid endarterectomy by Dr. Darrick PennaFields 05/08/13 which he follows

## 2015-06-26 NOTE — Patient Instructions (Signed)
Your physician recommends that you continue on your current medications as directed. Please refer to the Current Medication list given to you today.  Dr Berry recommends that you schedule a follow-up appointment in 1 year. You will receive a reminder letter in the mail two months in advance. If you don't receive a letter, please call our office to schedule the follow-up appointment.  If you need a refill on your cardiac medications before your next appointment, please call your pharmacy. 

## 2015-06-26 NOTE — Assessment & Plan Note (Signed)
History of hypertension blood pressure measured 124/70. He is on lisinopril , hydrochlorothiazide and verapamil. Continue current meds at current dosing

## 2015-06-29 ENCOUNTER — Ambulatory Visit (HOSPITAL_COMMUNITY)
Admission: RE | Admit: 2015-06-29 | Discharge: 2015-06-29 | Disposition: A | Discharge status: Home / Self Care | Payer: PPO | Source: Ambulatory Visit | Attending: Family | Admitting: Family

## 2015-06-29 ENCOUNTER — Encounter: Payer: Self-pay | Admitting: Family

## 2015-06-29 ENCOUNTER — Ambulatory Visit (INDEPENDENT_AMBULATORY_CARE_PROVIDER_SITE_OTHER): Payer: PPO | Admitting: Family

## 2015-06-29 ENCOUNTER — Other Ambulatory Visit: Payer: Self-pay | Admitting: Family

## 2015-06-29 VITALS — BP 152/79 | HR 62 | Temp 98.2°F | Resp 14 | Ht 66.0 in | Wt 162.4 lb

## 2015-06-29 DIAGNOSIS — I6522 Occlusion and stenosis of left carotid artery: Secondary | ICD-10-CM | POA: Diagnosis not present

## 2015-06-29 DIAGNOSIS — Z9889 Other specified postprocedural states: Secondary | ICD-10-CM | POA: Diagnosis not present

## 2015-06-29 DIAGNOSIS — Z48812 Encounter for surgical aftercare following surgery on the circulatory system: Secondary | ICD-10-CM

## 2015-06-29 DIAGNOSIS — I6529 Occlusion and stenosis of unspecified carotid artery: Secondary | ICD-10-CM

## 2015-06-29 DIAGNOSIS — I6521 Occlusion and stenosis of right carotid artery: Secondary | ICD-10-CM | POA: Diagnosis not present

## 2015-06-29 NOTE — Progress Notes (Addendum)
Chief Complaint: Follow up Extracranial Carotid Artery Stenosis   History of Present Illness  David Sellers is a 75 y.o. male patient of Dr. Darrick PennaFields who is s/p left carotid endarterectomy on 05/08/13 , returns today for follow up.  The patient denies any history of TIA or stroke symptoms, specifically the patient denies a history of amaurosis fugax or monocular blindness, denies a history unilateral facial drooping, denies a history of hemiplegia, and denies a history of receptive or expressive aphasia.  Pt denies claudication symptoms, denies non healing wounds.  He is a Optician, dispensingminister, still works full time in a [employer].   Pt Diabetic: diet controlled Pt smoker: non-smoker  Pt meds include: Statin : yes ASA: Yes Other anticoagulants/antiplatelets: no    Past Medical History  Diagnosis Date  . Hyperlipidemia     no meds since Jan 1  . BPH (benign prostatic hyperplasia)   . Arthritis   . Carotid artery occlusion     right ICA 50% stenosis  . [place]ally [place] in left [place], [place]     hit by a baseball  . Carotodynia   . Hypertension     takes Verapamil,Lisinopril,and Cardura daily  . GERD (gastroesophageal reflux disease)     takes Omeprazole daily  . Asthma     only has flareup with smelly perfume   . History of bronchitis     last time 10-961yrs ago  . Headache(784.0)     r/t arthritis in neck   . Joint pain   . Low back pain     reason unknown  . Hemorrhoids   . History of colon polyps   . Urinary frequency     sees Dr.Tannenbaum  . Diabetes mellitus without complication (HCC)     borderline  . Cataract     immature to the right eye    Social History Social History  Substance Use Topics  . Smoking status: Never Smoker   . Smokeless tobacco: Never Used  . Alcohol Use: No    Family History Family History  Problem Relation Age of Onset  . Adopted: Yes  . Heart disease Father   . Heart attack Father [id number]  . Hypertension Father   .  Hyperlipidemia Father   . Diabetes Daughter   . Hyperlipidemia Daughter   . Hypertension Daughter   . Diabetes Daughter   . Hyperlipidemia Daughter   . Hypertension Daughter   . Kidney disease Mother     Surgical History Past Surgical History  Procedure Laterality Date  . Retinal laser surgery Left     plate placed  . Hernia repair      x 2  . Retinal detachment surgery    . Egd with dilitation    . Colonoscopy    . Endarterectomy Left 05/08/2013    Procedure: LEFT CAROTID ARTERY ENDARTERECTOMY WITH DACRON PATCH ANGIOPLASTY;  Surgeon: Sherren Kernsharles E Fields, MD;  [facility];  Service: Vascular;  Laterality: Left;  . Carotid endarterectomy Left 05/08/13  . Eye surgery      Allergies  Allergen Reactions  . Meloxicam Itching, Palpitations and Other (See Comments)    Headaches, Mood swings.  . Morphine And Related Nausea And Vomiting  . Statins Other (See Comments)    Severe arthritic response  . Cholestoff [Plant Sterols And Stanols] Other (See Comments)    Muscle aches  . Nsaids Other (See Comments)    Aleve ,  Myalgia  . Atorvastatin  Severe myalgias  . Darvon [Propoxyphene] Other (See Comments)    hallucinate  . Indomethacin     Neck pain flare  . Penicillins     *positive allergy test*  . Pravastatin     myalgias  . Sulfa Antibiotics Nausea And Vomiting    Current Outpatient Prescriptions  Medication Sig Dispense Refill  . acetaminophen (TYLENOL) 650 MG CR tablet Take 1,300 mg by mouth every 8 (eight) hours as needed for pain.    Marland Kitchen aspirin EC 81 MG tablet Take 81 mg by mouth every morning.    . cetirizine (ZYRTEC) 10 MG tablet Take 10 mg by mouth daily.    Marland Kitchen doxazosin (CARDURA) 8 MG tablet Take 8 mg by mouth every evening.     . hydrochlorothiazide (MICROZIDE) 12.5 MG capsule Take 6.25 mg by mouth daily.     Marland Kitchen KRILL OIL PO Take 1 capsule by mouth 2 (two) times daily.     Marland Kitchen lisinopril (PRINIVIL,ZESTRIL) 30 MG tablet Take 1 tablet by mouth 2 (two) times daily.   1  . lovastatin (MEVACOR) 20 MG tablet Take 20 mg by mouth at bedtime.    Marland Kitchen omeprazole (PRILOSEC) 20 MG capsule Take 20 mg by mouth every morning.    . verapamil (CALAN) 120 MG tablet Take 240 mg by mouth every evening.     . verapamil (COVERA HS) 240 MG (CO) 24 hr tablet Take 240 mg by mouth every morning.    . loratadine (CLARITIN) 10 MG tablet Take 10 mg by mouth daily. Reported on 06/29/2015     No current facility-administered medications for this visit.    Review of Systems : See HPI for pertinent positives and negatives.  Physical Examination  Filed Vitals:   06/29/15 1129  BP: 152/79  Pulse: 62  Temp: 98.2 F (36.8 C)  TempSrc: Oral  Resp: 14  Height:  (1.676 m)  Weight: 162 lb 6.4 oz (73.664 kg)  SpO2: 98%   Body mass index is 26.22 kg/(m^2).  General: WDWN male in NAD GAIT: normal Eyes: Right pupil reacts to light normally; left pupil is dilated and clouded (injured as a child). Pulmonary: Non-labored respirations, CTAB.  Cardiac: regular rhythm, no detected murmur.  VASCULAR EXAM Carotid Bruits Right Left   Negative Negative   Radial pulses are 2+ palpable and equal.      LE Pulses Right Left   POPLITEAL not palpable  not palpable   POSTERIOR TIBIAL  palpable   palpable    DORSALIS PEDIS  ANTERIOR TIBIAL not palpable  not palpable     Gastrointestinal: soft, nontender, BS WNL, no r/g,no palpated masses.  Musculoskeletal: No muscle atrophy/wasting. M/S 5/5 throughout, Extremities without ischemic changes.  Neurologic: A&O X 3; Appropriate Affect ; SENSATION ;normal;  Speech is normal CN 2-12 intact except for left pupil as above, Pain and light touch intact in extremities, Motor exam as listed above.                Non-Invasive Vascular  Imaging CAROTID DUPLEX 06/29/2015   Right ICA: <40% stenosis. Limited visualization of the right proximal ICA due to a roughly 2.0 segment of calcified plaque. Left ICA: no restenosis. No significant change compared to the last exam.    Assessment: David Sellers is a 75 y.o. male who is s/p left carotid endarterectomy on 05/08/13. He has no history of stroke or TIA.  Today's carotid Duplex suggests <40% right ICA stenosis and patent left carotid endarterectomy site with  no evidence of hyperplasia or restenosis.  Limited visualization of the right proximal internal carotid artery due to a roughly 2.0 cm segment of calcified plaque. No significant change in comparison to the exam on 11/28/2013 and 06/27/14.    Plan: Follow-up in 1 year with Carotid Duplex scan.   I discussed in depth with the patient the nature of atherosclerosis, and emphasized the importance of maximal medical management including strict control of blood pressure, blood glucose, and lipid levels, obtaining regular exercise, and continued cessation of smoking.  The patient is aware that without maximal medical management the underlying atherosclerotic disease process will progress, limiting the benefit of any interventions. The patient was given information about stroke prevention and what symptoms should prompt the patient to seek immediate medical care. Thank you for allowing [place] to participate in this patient's care.  Charisse March, RN, MSN, FNP-C [facility] Office: [phone]  Clinic Physician: Myra Gianotti  06/29/2015 12:03 PM

## 2015-07-09 DIAGNOSIS — E119 Type 2 diabetes mellitus without complications: Secondary | ICD-10-CM | POA: Diagnosis not present

## 2015-07-09 DIAGNOSIS — I10 Essential (primary) hypertension: Secondary | ICD-10-CM | POA: Diagnosis not present

## 2015-07-09 DIAGNOSIS — D72819 Decreased white blood cell count, unspecified: Secondary | ICD-10-CM | POA: Diagnosis not present

## 2015-07-17 DIAGNOSIS — Z0000 Encounter for general adult medical examination without abnormal findings: Secondary | ICD-10-CM | POA: Diagnosis not present

## 2015-07-17 DIAGNOSIS — H6120 Impacted cerumen, unspecified ear: Secondary | ICD-10-CM | POA: Diagnosis not present

## 2015-07-17 DIAGNOSIS — K219 Gastro-esophageal reflux disease without esophagitis: Secondary | ICD-10-CM | POA: Diagnosis not present

## 2015-07-17 DIAGNOSIS — R972 Elevated prostate specific antigen [PSA]: Secondary | ICD-10-CM | POA: Diagnosis not present

## 2015-07-17 DIAGNOSIS — I6529 Occlusion and stenosis of unspecified carotid artery: Secondary | ICD-10-CM | POA: Diagnosis not present

## 2015-07-17 DIAGNOSIS — E119 Type 2 diabetes mellitus without complications: Secondary | ICD-10-CM | POA: Diagnosis not present

## 2015-07-17 DIAGNOSIS — I10 Essential (primary) hypertension: Secondary | ICD-10-CM | POA: Diagnosis not present

## 2015-07-17 DIAGNOSIS — E782 Mixed hyperlipidemia: Secondary | ICD-10-CM | POA: Diagnosis not present

## 2015-09-08 ENCOUNTER — Other Ambulatory Visit: Payer: Self-pay | Admitting: Family

## 2015-09-08 DIAGNOSIS — I6522 Occlusion and stenosis of left carotid artery: Secondary | ICD-10-CM

## 2015-11-20 DIAGNOSIS — R1030 Lower abdominal pain, unspecified: Secondary | ICD-10-CM | POA: Diagnosis not present

## 2015-12-02 DIAGNOSIS — K4090 Unilateral inguinal hernia, without obstruction or gangrene, not specified as recurrent: Secondary | ICD-10-CM | POA: Diagnosis not present

## 2015-12-02 DIAGNOSIS — R1032 Left lower quadrant pain: Secondary | ICD-10-CM | POA: Diagnosis not present

## 2015-12-15 DIAGNOSIS — R1032 Left lower quadrant pain: Secondary | ICD-10-CM | POA: Diagnosis not present

## 2015-12-21 ENCOUNTER — Other Ambulatory Visit: Payer: Self-pay | Admitting: Surgery

## 2015-12-21 DIAGNOSIS — R1032 Left lower quadrant pain: Secondary | ICD-10-CM

## 2015-12-24 ENCOUNTER — Ambulatory Visit
Admission: RE | Admit: 2015-12-24 | Discharge: 2015-12-24 | Disposition: A | Discharge status: Home / Self Care | Payer: PPO | Source: Ambulatory Visit | Attending: Surgery | Admitting: Surgery

## 2015-12-24 DIAGNOSIS — K5730 Diverticulosis of large intestine without perforation or abscess without bleeding: Secondary | ICD-10-CM | POA: Diagnosis not present

## 2015-12-24 DIAGNOSIS — R1032 Left lower quadrant pain: Secondary | ICD-10-CM

## 2015-12-24 IMAGING — CT CT ABD-PELV W/ CM
1 of 3 series · 13 of 32 positions shown, 17 images · IV contrast (APPLIED)
Comparison: No priors.

CLINICAL DATA: 75-year-old male with history of left-sided flank
pain and bilateral groin pain for the past 3 weeks, worst in the
morning. Prior history of hernia repairs 10 years ago.

EXAM:
CT ABDOMEN AND PELVIS WITH CONTRAST
TECHNIQUE: Multidetector CT imaging of the abdomen and pelvis was performed
using the standard protocol following bolus administration of
intravenous contrast.
CONTRAST:  100mL [JH] IOPAMIDOL ([JH]) INJECTION 61%

[Series 2: abd/pelvis w/cm · axial · 0.73mm/px · z∈[+907,+1357]mm · 13 of 102 slices shown, 17 images]
[im 6/102  soft-tissue]
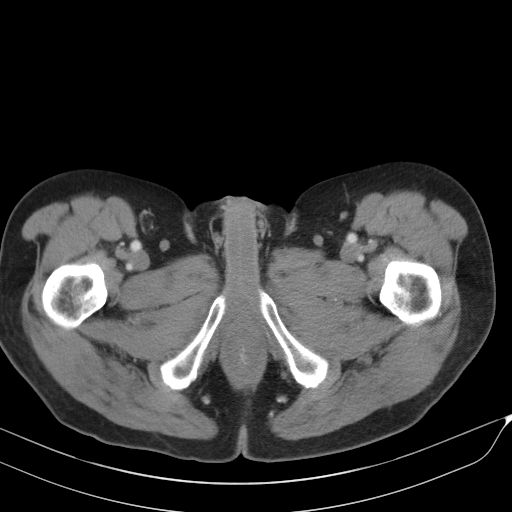
[im 6/102  bone]
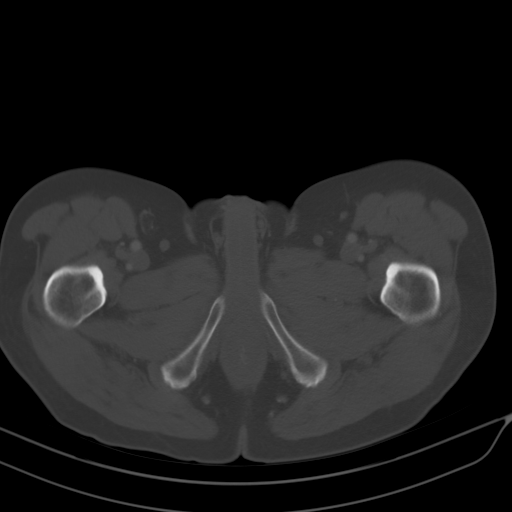
[im 16/102  soft-tissue]
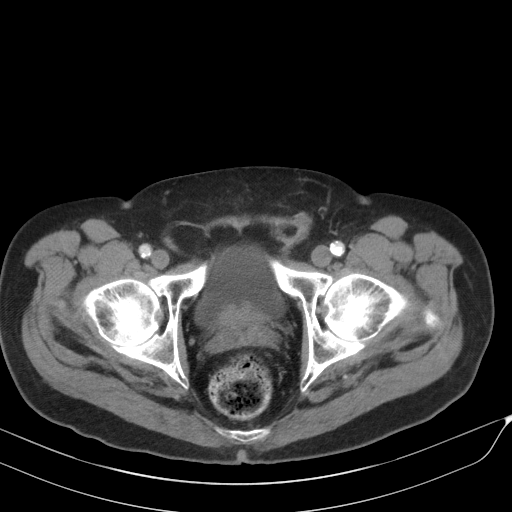
[im 27/102  soft-tissue]
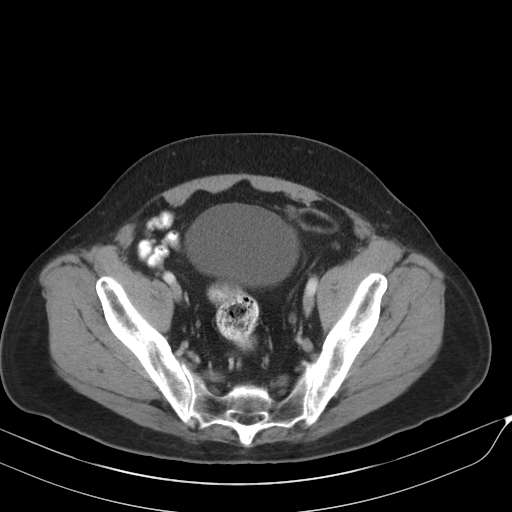
[im 32/102  soft-tissue]
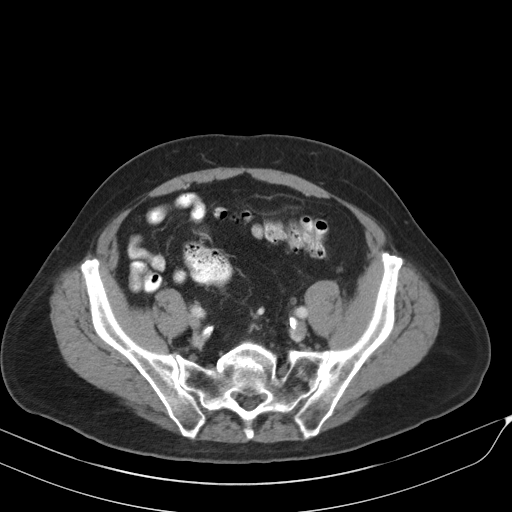
[im 43/102  soft-tissue]
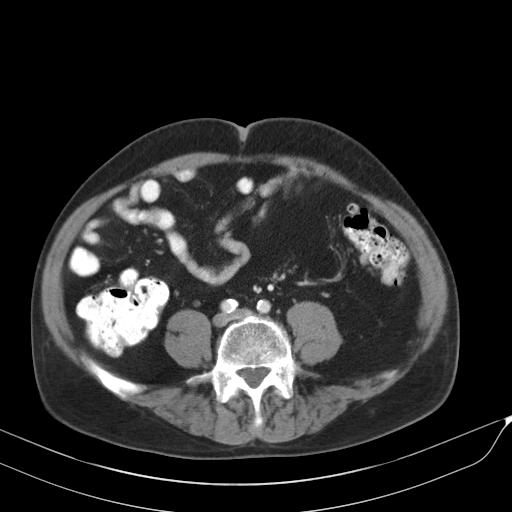
[im 54/102  soft-tissue]
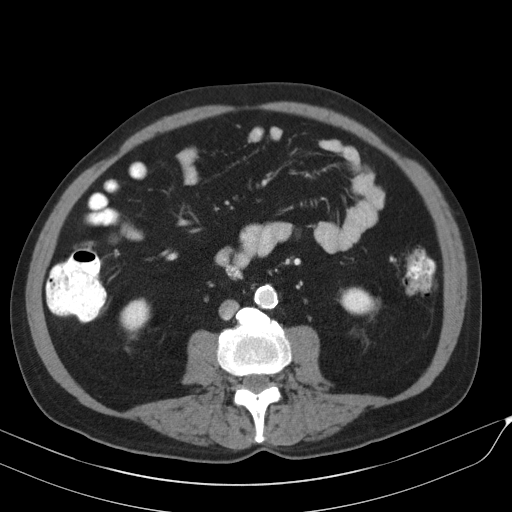
[im 59/102  soft-tissue]
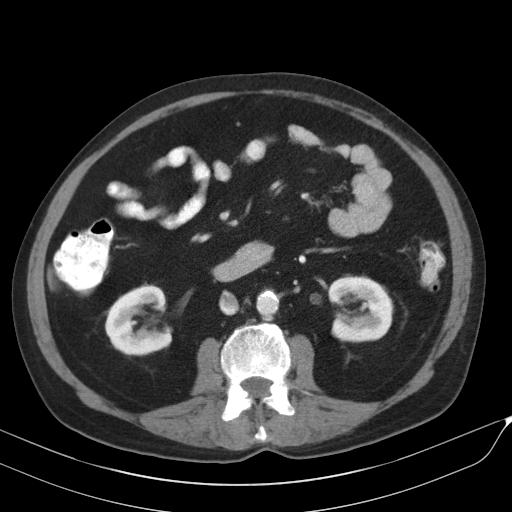
[im 70/102  soft-tissue]
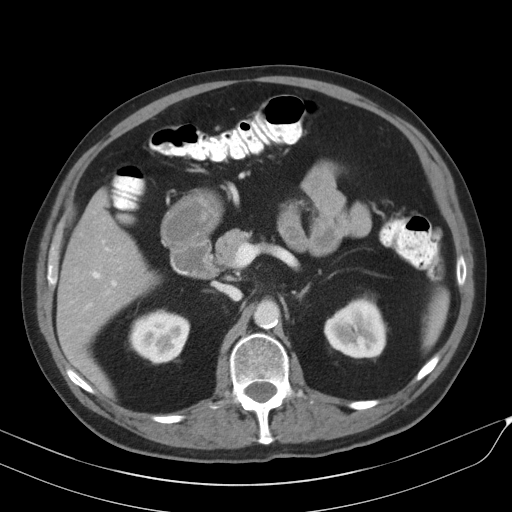
[im 75/102  soft-tissue]
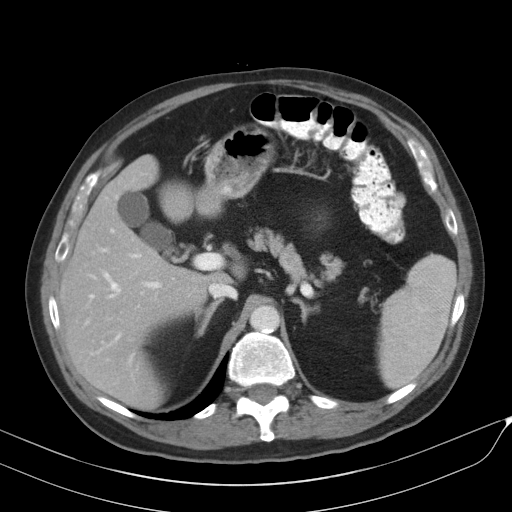
[im 75/102  bone]
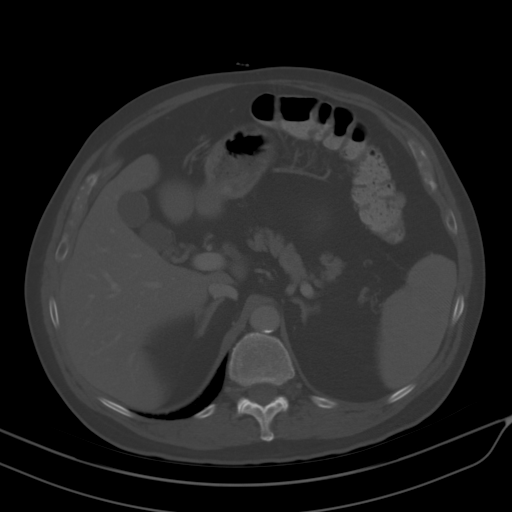
[im 80/102  lung]
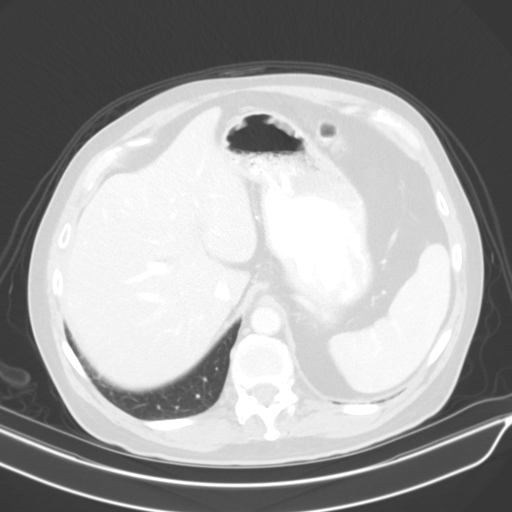
[im 86/102  soft-tissue]
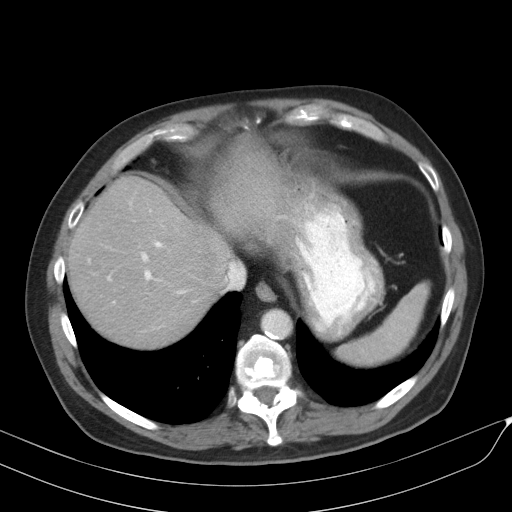
[im 86/102  lung]
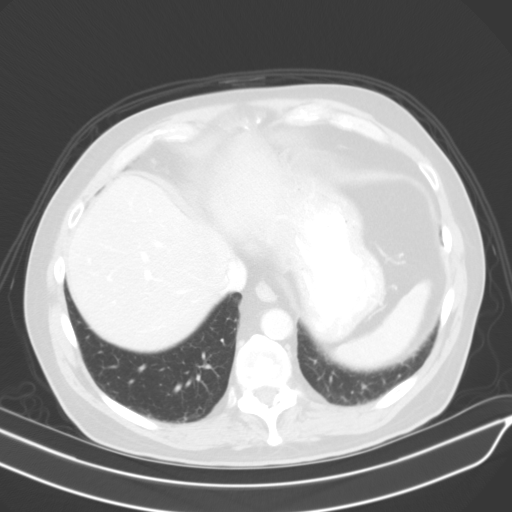
[im 91/102  lung]
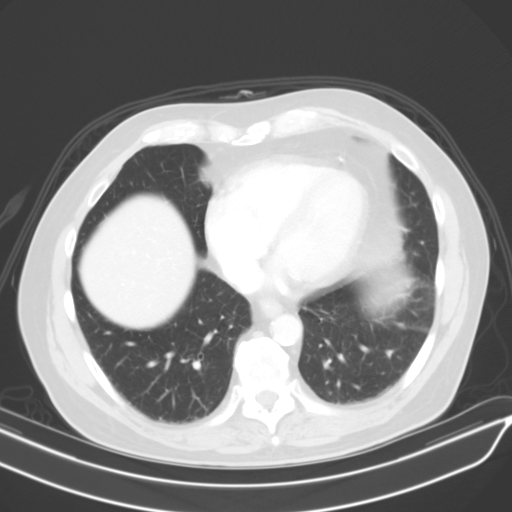
[im 96/102  soft-tissue]
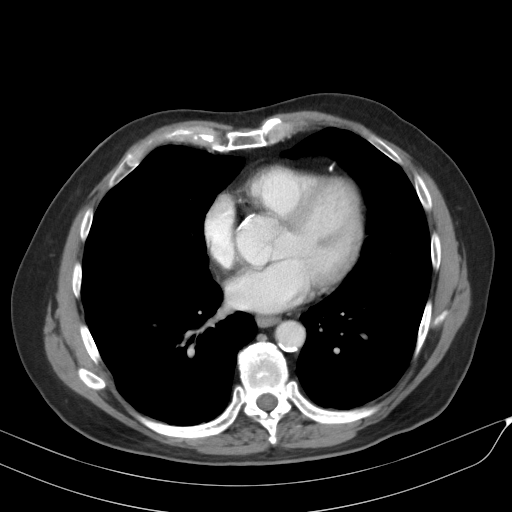
[im 96/102  lung]
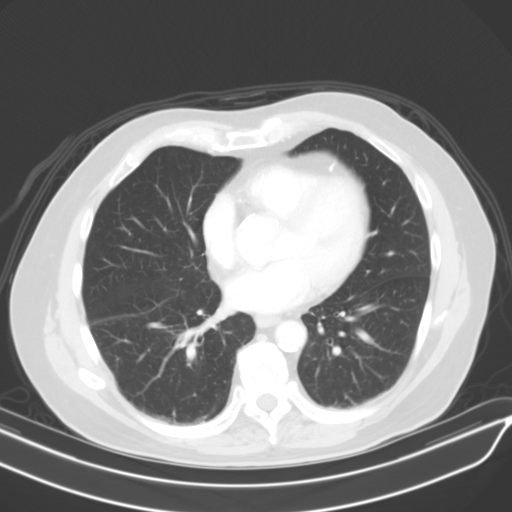

[13 of 32 positions shown; findings below may reference images not displayed]

FINDINGS: Lower chest: There is aortic atherosclerosis, as well as
atherosclerosis of the coronary arteries, including calcified
atherosclerotic plaque in the left main, left anterior descending,
left circumflex and right coronary arteries.

Hepatobiliary: No cystic or solid hepatic lesions. No intra or
extrahepatic biliary ductal dilatation. Gallbladder is normal in
appearance.

Pancreas: No pancreatic mass. No pancreatic ductal dilatation. No
pancreatic or peripancreatic fluid or inflammatory changes.

Spleen: Unremarkable.

Adrenals/Urinary Tract: Bilateral adrenal glands and bilateral
kidneys are normal in appearance. No hydroureteronephrosis. Urinary
bladder is normal in appearance.

Stomach/Bowel: Normal appearance of the stomach. No pathologic
dilatation of small bowel or colon. The appendix is not confidently
identified and may be surgically absent. Regardless, there are no
inflammatory changes noted adjacent to the cecum to suggest the
presence of an acute appendicitis at this time.

Vascular/Lymphatic: Aortic atherosclerosis, without evidence of
aneurysm or dissection in the abdominal or pelvic vasculature. No
lymphadenopathy noted in the abdomen or pelvis.

Reproductive: Prostate gland seminal vesicles are unremarkable in
appearance.

Other: In the left lower quadrant immediately inferior to the
proximal sigmoid colon and deep to the left inguinal region there
are several areas of fatty attenuation surrounded by the linear
regions of more soft tissue attenuation, which have an appearance
most characteristic for areas of chronic fat necrosis. There are
some very subtle inflammatory changes in this region, which could
indicate a more subacute process, such as resolving epiploic
appendagitis, however, that is not strongly favored. Small right
inguinal hernia containing only fat. No significant volume of
ascites. No pneumoperitoneum.

Musculoskeletal: There are no aggressive appearing lytic or blastic
lesions noted in the visualized portions of the skeleton.
IMPRESSION: 1. There are findings in the left lower quadrant, as above, which
are favored to predominantly reflect areas of chronic fat necrosis,
potentially related to remote left sided inguinal herniorrhaphy. No
evidence of recurrent left inguinal hernia. There are some subtle
inflammatory changes in this region such that resolving subacute
process such as epiploic appendagitis could also be considered,
although this is not strongly favored.
2. Small right inguinal hernia containing only fat.
3. Colonic diverticulosis without definite findings to suggest an
acute diverticulitis at this time.
4. Aortic atherosclerosis, in addition to left main and 3 vessel
coronary artery disease. Assessment for potential risk factor
modification, dietary therapy or pharmacologic therapy may be
warranted, if clinically indicated.
5. Additional incidental findings, as above.

## 2015-12-24 MED ORDER — IOPAMIDOL (ISOVUE-300) INJECTION 61%
100.0000 mL | Freq: Once | INTRAVENOUS | Status: AC | PRN
  Administered 2015-12-24: 100 mL via INTRAVENOUS

## 2015-12-29 DIAGNOSIS — E119 Type 2 diabetes mellitus without complications: Secondary | ICD-10-CM | POA: Diagnosis not present

## 2015-12-29 DIAGNOSIS — E782 Mixed hyperlipidemia: Secondary | ICD-10-CM | POA: Diagnosis not present

## 2015-12-29 DIAGNOSIS — Z0000 Encounter for general adult medical examination without abnormal findings: Secondary | ICD-10-CM | POA: Diagnosis not present

## 2015-12-29 DIAGNOSIS — I10 Essential (primary) hypertension: Secondary | ICD-10-CM | POA: Diagnosis not present

## 2015-12-29 DIAGNOSIS — R972 Elevated prostate specific antigen [PSA]: Secondary | ICD-10-CM | POA: Diagnosis not present

## 2015-12-29 DIAGNOSIS — K219 Gastro-esophageal reflux disease without esophagitis: Secondary | ICD-10-CM | POA: Diagnosis not present

## 2015-12-29 DIAGNOSIS — I6529 Occlusion and stenosis of unspecified carotid artery: Secondary | ICD-10-CM | POA: Diagnosis not present

## 2016-01-15 ENCOUNTER — Telehealth: Payer: Self-pay | Admitting: Cardiovascular Disease

## 2016-01-15 NOTE — Telephone Encounter (Signed)
new message   Pt verbalized that he wants Dr.Berry to look at the CT from 01/03/16

## 2016-01-15 NOTE — Telephone Encounter (Signed)
Pt would like Dr Allyson SabalBerry to read his recent CT of the Abdomen. Dr Allyson SabalBerry is out of the office until next week, left detailed message informing pt of this and that this message will not be seen by Dr Allyson SabalBerry until then.

## 2016-01-18 NOTE — Telephone Encounter (Signed)
Pt notified to increase exercise/walking follow heart healthy diet, low salt/fat/chol diet. Pt states that his PCP also suggested this. He states that he has started and will increase walking and he thinks that this is all the exercise that he can do.

## 2016-01-18 NOTE — Telephone Encounter (Signed)
I reviewed CT scan. He has LM/3 Vessel cor calcification. Neg MV 2015 and no S/O angina. Nothing further needs to be done except nl risk factor modification  JJB

## 2016-03-04 DIAGNOSIS — N401 Enlarged prostate with lower urinary tract symptoms: Secondary | ICD-10-CM | POA: Diagnosis not present

## 2016-03-04 DIAGNOSIS — R350 Frequency of micturition: Secondary | ICD-10-CM | POA: Diagnosis not present

## 2016-03-04 DIAGNOSIS — R351 Nocturia: Secondary | ICD-10-CM | POA: Diagnosis not present

## 2016-03-14 DIAGNOSIS — R278 Other lack of coordination: Secondary | ICD-10-CM | POA: Diagnosis not present

## 2016-03-14 DIAGNOSIS — R1030 Lower abdominal pain, unspecified: Secondary | ICD-10-CM | POA: Diagnosis not present

## 2016-03-14 DIAGNOSIS — M62838 Other muscle spasm: Secondary | ICD-10-CM | POA: Diagnosis not present

## 2016-03-14 DIAGNOSIS — M6281 Muscle weakness (generalized): Secondary | ICD-10-CM | POA: Diagnosis not present

## 2016-03-14 DIAGNOSIS — R350 Frequency of micturition: Secondary | ICD-10-CM | POA: Diagnosis not present

## 2016-03-16 DIAGNOSIS — H31002 Unspecified chorioretinal scars, left eye: Secondary | ICD-10-CM | POA: Diagnosis not present

## 2016-03-16 DIAGNOSIS — R1030 Lower abdominal pain, unspecified: Secondary | ICD-10-CM | POA: Diagnosis not present

## 2016-03-16 DIAGNOSIS — M6281 Muscle weakness (generalized): Secondary | ICD-10-CM | POA: Diagnosis not present

## 2016-03-16 DIAGNOSIS — H5211 Myopia, right eye: Secondary | ICD-10-CM | POA: Diagnosis not present

## 2016-03-16 DIAGNOSIS — E119 Type 2 diabetes mellitus without complications: Secondary | ICD-10-CM | POA: Diagnosis not present

## 2016-03-16 DIAGNOSIS — R3915 Urgency of urination: Secondary | ICD-10-CM | POA: Diagnosis not present

## 2016-03-16 DIAGNOSIS — M62838 Other muscle spasm: Secondary | ICD-10-CM | POA: Diagnosis not present

## 2016-03-16 DIAGNOSIS — R350 Frequency of micturition: Secondary | ICD-10-CM | POA: Diagnosis not present

## 2016-03-16 DIAGNOSIS — R351 Nocturia: Secondary | ICD-10-CM | POA: Diagnosis not present

## 2016-03-22 DIAGNOSIS — R1030 Lower abdominal pain, unspecified: Secondary | ICD-10-CM | POA: Diagnosis not present

## 2016-03-22 DIAGNOSIS — M62838 Other muscle spasm: Secondary | ICD-10-CM | POA: Diagnosis not present

## 2016-03-22 DIAGNOSIS — N50812 Left testicular pain: Secondary | ICD-10-CM | POA: Diagnosis not present

## 2016-03-22 DIAGNOSIS — M6281 Muscle weakness (generalized): Secondary | ICD-10-CM | POA: Diagnosis not present

## 2016-03-31 DIAGNOSIS — N50812 Left testicular pain: Secondary | ICD-10-CM | POA: Diagnosis not present

## 2016-03-31 DIAGNOSIS — R1030 Lower abdominal pain, unspecified: Secondary | ICD-10-CM | POA: Diagnosis not present

## 2016-03-31 DIAGNOSIS — R350 Frequency of micturition: Secondary | ICD-10-CM | POA: Diagnosis not present

## 2016-03-31 DIAGNOSIS — M62838 Other muscle spasm: Secondary | ICD-10-CM | POA: Diagnosis not present

## 2016-03-31 DIAGNOSIS — R3915 Urgency of urination: Secondary | ICD-10-CM | POA: Diagnosis not present

## 2016-03-31 DIAGNOSIS — M6281 Muscle weakness (generalized): Secondary | ICD-10-CM | POA: Diagnosis not present

## 2016-04-05 DIAGNOSIS — R3915 Urgency of urination: Secondary | ICD-10-CM | POA: Diagnosis not present

## 2016-04-05 DIAGNOSIS — N50812 Left testicular pain: Secondary | ICD-10-CM | POA: Diagnosis not present

## 2016-04-05 DIAGNOSIS — R1030 Lower abdominal pain, unspecified: Secondary | ICD-10-CM | POA: Diagnosis not present

## 2016-04-05 DIAGNOSIS — R351 Nocturia: Secondary | ICD-10-CM | POA: Diagnosis not present

## 2016-04-05 DIAGNOSIS — M62838 Other muscle spasm: Secondary | ICD-10-CM | POA: Diagnosis not present

## 2016-04-05 DIAGNOSIS — M6281 Muscle weakness (generalized): Secondary | ICD-10-CM | POA: Diagnosis not present

## 2016-04-12 DIAGNOSIS — R351 Nocturia: Secondary | ICD-10-CM | POA: Diagnosis not present

## 2016-04-12 DIAGNOSIS — M6281 Muscle weakness (generalized): Secondary | ICD-10-CM | POA: Diagnosis not present

## 2016-04-12 DIAGNOSIS — N50812 Left testicular pain: Secondary | ICD-10-CM | POA: Diagnosis not present

## 2016-04-12 DIAGNOSIS — M62838 Other muscle spasm: Secondary | ICD-10-CM | POA: Diagnosis not present

## 2016-04-12 DIAGNOSIS — R3915 Urgency of urination: Secondary | ICD-10-CM | POA: Diagnosis not present

## 2016-04-12 DIAGNOSIS — R1030 Lower abdominal pain, unspecified: Secondary | ICD-10-CM | POA: Diagnosis not present

## 2016-04-19 DIAGNOSIS — R351 Nocturia: Secondary | ICD-10-CM | POA: Diagnosis not present

## 2016-04-19 DIAGNOSIS — R3915 Urgency of urination: Secondary | ICD-10-CM | POA: Diagnosis not present

## 2016-04-19 DIAGNOSIS — R1030 Lower abdominal pain, unspecified: Secondary | ICD-10-CM | POA: Diagnosis not present

## 2016-04-19 DIAGNOSIS — N50812 Left testicular pain: Secondary | ICD-10-CM | POA: Diagnosis not present

## 2016-04-19 DIAGNOSIS — M62838 Other muscle spasm: Secondary | ICD-10-CM | POA: Diagnosis not present

## 2016-04-19 DIAGNOSIS — R350 Frequency of micturition: Secondary | ICD-10-CM | POA: Diagnosis not present

## 2016-04-19 DIAGNOSIS — M6281 Muscle weakness (generalized): Secondary | ICD-10-CM | POA: Diagnosis not present

## 2016-04-19 DIAGNOSIS — R8271 Bacteriuria: Secondary | ICD-10-CM | POA: Diagnosis not present

## 2016-04-27 DIAGNOSIS — M6281 Muscle weakness (generalized): Secondary | ICD-10-CM | POA: Diagnosis not present

## 2016-04-27 DIAGNOSIS — R1030 Lower abdominal pain, unspecified: Secondary | ICD-10-CM | POA: Diagnosis not present

## 2016-04-27 DIAGNOSIS — M62838 Other muscle spasm: Secondary | ICD-10-CM | POA: Diagnosis not present

## 2016-04-27 DIAGNOSIS — R351 Nocturia: Secondary | ICD-10-CM | POA: Diagnosis not present

## 2016-04-27 DIAGNOSIS — R3915 Urgency of urination: Secondary | ICD-10-CM | POA: Diagnosis not present

## 2016-04-27 DIAGNOSIS — N50812 Left testicular pain: Secondary | ICD-10-CM | POA: Diagnosis not present

## 2016-04-28 DIAGNOSIS — N401 Enlarged prostate with lower urinary tract symptoms: Secondary | ICD-10-CM | POA: Diagnosis not present

## 2016-04-28 DIAGNOSIS — R351 Nocturia: Secondary | ICD-10-CM | POA: Diagnosis not present

## 2016-05-19 DIAGNOSIS — M6281 Muscle weakness (generalized): Secondary | ICD-10-CM | POA: Diagnosis not present

## 2016-05-19 DIAGNOSIS — R3915 Urgency of urination: Secondary | ICD-10-CM | POA: Diagnosis not present

## 2016-05-19 DIAGNOSIS — N50812 Left testicular pain: Secondary | ICD-10-CM | POA: Diagnosis not present

## 2016-05-19 DIAGNOSIS — R351 Nocturia: Secondary | ICD-10-CM | POA: Diagnosis not present

## 2016-05-19 DIAGNOSIS — M62838 Other muscle spasm: Secondary | ICD-10-CM | POA: Diagnosis not present

## 2016-05-19 DIAGNOSIS — R1030 Lower abdominal pain, unspecified: Secondary | ICD-10-CM | POA: Diagnosis not present

## 2016-06-01 DIAGNOSIS — H6123 Impacted cerumen, bilateral: Secondary | ICD-10-CM | POA: Diagnosis not present

## 2016-06-09 DIAGNOSIS — N50812 Left testicular pain: Secondary | ICD-10-CM | POA: Diagnosis not present

## 2016-06-09 DIAGNOSIS — R3915 Urgency of urination: Secondary | ICD-10-CM | POA: Diagnosis not present

## 2016-06-09 DIAGNOSIS — R1030 Lower abdominal pain, unspecified: Secondary | ICD-10-CM | POA: Diagnosis not present

## 2016-06-09 DIAGNOSIS — M62838 Other muscle spasm: Secondary | ICD-10-CM | POA: Diagnosis not present

## 2016-06-09 DIAGNOSIS — M6281 Muscle weakness (generalized): Secondary | ICD-10-CM | POA: Diagnosis not present

## 2016-06-24 ENCOUNTER — Encounter: Payer: Self-pay | Admitting: Family

## 2016-06-30 ENCOUNTER — Ambulatory Visit (HOSPITAL_COMMUNITY)
Admission: RE | Admit: 2016-06-30 | Discharge: 2016-06-30 | Disposition: A | Discharge status: Home / Self Care | Payer: PPO | Source: Ambulatory Visit | Attending: Family | Admitting: Family

## 2016-06-30 ENCOUNTER — Ambulatory Visit (INDEPENDENT_AMBULATORY_CARE_PROVIDER_SITE_OTHER): Payer: PPO | Admitting: Family

## 2016-06-30 ENCOUNTER — Encounter: Payer: Self-pay | Admitting: Family

## 2016-06-30 VITALS — BP 159/80 | HR 57 | Temp 97.0°F | Resp 18 | Ht 66.0 in | Wt 160.0 lb

## 2016-06-30 DIAGNOSIS — I6522 Occlusion and stenosis of left carotid artery: Secondary | ICD-10-CM

## 2016-06-30 DIAGNOSIS — I6521 Occlusion and stenosis of right carotid artery: Secondary | ICD-10-CM | POA: Diagnosis not present

## 2016-06-30 DIAGNOSIS — Z9889 Other specified postprocedural states: Secondary | ICD-10-CM | POA: Diagnosis not present

## 2016-06-30 LAB — VAS US CAROTID
LCCADDIAS: 16 cm/s
LCCAPDIAS: 11 cm/s
LCCAPSYS: 78 cm/s
LEFT ECA DIAS: -8 cm/s
LEFT VERTEBRAL DIAS: -10 cm/s
LICADDIAS: -20 cm/s
LICADSYS: -71 cm/s
LICAPSYS: -52 cm/s
Left CCA dist sys: 123 cm/s
Left ICA prox dias: -14 cm/s
RIGHT CCA MID DIAS: 13 cm/s
RIGHT ECA DIAS: -6 cm/s
Right CCA prox dias: 8 cm/s
Right CCA prox sys: 83 cm/s
Right cca dist sys: -69 cm/s

## 2016-06-30 NOTE — Progress Notes (Signed)
Chief Complaint: Follow up Extracranial Carotid Artery Stenosis   History of Present Illness  David Sellers is a 76 y.o. male who is s/p left carotid endarterectomy on 05/08/13 by Dr. Darrick PennaFields, returns today for follow up.  The patient denies any history of TIA or stroke symptoms, specifically he denies a history of amaurosis fugax or monocular blindness,  unilateral facial drooping, hemiplegia, or receptive or expressive aphasia.  Pt denies claudication symptoms, denies non healing wounds.  He is a Optician, dispensingminister, still works full time in a [employer].  He is quite active physically.   Pt Diabetic: diet controlled Pt smoker: non-smoker  Pt meds include: Statin : yes ASA: Yes Other anticoagulants/antiplatelets: no    Past Medical History:  Diagnosis Date  . Arthritis   . Asthma    only has flareup with smelly perfume   . BPH (benign prostatic hyperplasia)   . Carotid artery occlusion    right ICA 50% stenosis  . Carotodynia   . Cataract    immature to the right eye  . Diabetes mellitus without complication (HCC)    borderline  . GERD (gastroesophageal reflux disease)    takes Omeprazole daily  . Headache(784.0)    r/t arthritis in neck   . Hemorrhoids   . History of bronchitis    last time 10-4514yrs ago  . History of colon polyps   . Hyperlipidemia    no meds since Jan 1  . Hypertension    takes Verapamil,Lisinopril,and Cardura daily  . Joint pain   . Legally blind in left eye, as [place]    hit by a baseball  . Low back pain    reason unknown  . Urinary frequency    sees Dr.Tannenbaum    Social History Social History  Substance Use Topics  . Smoking status: Never Smoker  . Smokeless tobacco: Never Used  . Alcohol use No    Family History Family History  Problem Relation Age of Onset  . Adopted: Yes  . Heart disease Father   . Heart attack Father [id number]  . Hypertension Father   . Hyperlipidemia Father   . Diabetes Daughter   .  Hyperlipidemia Daughter   . Hypertension Daughter   . Diabetes Daughter   . Hyperlipidemia Daughter   . Hypertension Daughter   . Kidney disease Mother     Surgical History Past Surgical History:  Procedure Laterality Date  . CAROTID ENDARTERECTOMY Left 05/08/13  . COLONOSCOPY    . EGD with dilitation    . ENDARTERECTOMY Left 05/08/2013   Procedure: LEFT CAROTID ARTERY ENDARTERECTOMY WITH DACRON PATCH ANGIOPLASTY;  Surgeon: Sherren Kernsharles E Fields, MD;  Location[facility] OR;  Service: Vascular;  Laterality: Left;  . EYE SURGERY    . HERNIA REPAIR     x 2  . RETINAL DETACHMENT SURGERY    . retinal laser surgery Left    plate placed    Allergies  Allergen Reactions  . Meloxicam Itching, Palpitations and Other (See Comments)    Headaches, Mood swings.  . Morphine And Related Nausea And Vomiting  . Statins Other (See Comments)    Severe arthritic response  . Cholestoff [Plant Sterols And Stanols] Other (See Comments)    Muscle aches  . Nsaids Other (See Comments)    Aleve ,  Myalgia  . Atorvastatin     Severe myalgias  . Darvon [Propoxyphene] Other (See Comments)    hallucinate  . Indomethacin     Neck pain  flare  . Penicillins     *positive allergy test*  . Pravastatin     myalgias  . Sulfa Antibiotics Nausea And Vomiting  . Tramadol     Current Outpatient Prescriptions  Medication Sig Dispense Refill  . acetaminophen (TYLENOL) 650 MG CR tablet Take 1,300 mg by mouth every 8 (eight) hours as needed for pain.    Marland Kitchen aspirin EC 81 MG tablet Take 81 mg by mouth every morning.    Marland Kitchen doxazosin (CARDURA) 8 MG tablet Take 8 mg by mouth every evening.     . hydrochlorothiazide (MICROZIDE) 12.5 MG capsule Take 6.25 mg by mouth daily.     Marland Kitchen KRILL OIL PO Take 1 capsule by mouth 2 (two) times daily.     Marland Kitchen lisinopril (PRINIVIL,ZESTRIL) 30 MG tablet Take 1 tablet by mouth 2 (two) times daily.  1  . loratadine (CLARITIN) 10 MG tablet Take 10 mg by mouth daily. Reported on 06/29/2015    .  lovastatin (MEVACOR) 20 MG tablet Take 20 mg by mouth at bedtime.    Marland Kitchen omeprazole (PRILOSEC) 20 MG capsule Take 20 mg by mouth every morning.    . verapamil (CALAN) 120 MG tablet Take 120 mg by mouth once.     . verapamil (COVERA HS) 240 MG (CO) 24 hr tablet Take 240 mg by mouth every morning.    . cetirizine (ZYRTEC) 10 MG tablet Take 10 mg by mouth daily.     No current facility-administered medications for this visit.     Review of Systems : See HPI for pertinent positives and negatives.  Physical Examination  Vitals:   06/30/16 1330 06/30/16 1332  BP: (!) 157/83 (!) 159/80  Pulse: (!) 57   Resp: 18   Temp: 97 F (36.1 C)   TempSrc: Oral   Weight: 160 lb (72.6 kg)   Height: 5\' 6"  (1.676 m)    Body mass index is 25.82 kg/m.  General: WDWN male in NAD GAIT: normal Eyes: Right pupil reacts to light normally; left pupil is dilated and clouded (injured in 1992). Pulmonary: Non-labored respirations, CTAB.  Cardiac: regular rhythm,no detected murmur.  VASCULAR EXAM Carotid Bruits Right Left   Negative Negative   Radial pulses are 2+ palpable and equal.      LE Pulses Right Left   POPLITEAL not palpable  not palpable   POSTERIOR TIBIAL  palpable   palpable    DORSALIS PEDIS  ANTERIOR TIBIAL not palpable  not palpable     Gastrointestinal: soft, nontender, BS WNL, no r/g,no palpated masses.  Musculoskeletal: No muscle atrophy/wasting. M/S 5/5 throughout, Extremities without ischemic changes.  Neurologic: A&O X 3; Appropriate Affect ; SENSATION ;normal;  Speech is normal CN 2-12 intact except for left pupil as above, Pain and light touch intact in extremities, Motor exam as listed above.     Assessment: David Sellers is a 76 y.o. male who is s/p left carotid  endarterectomy on 05/08/13. He has no history of stroke or TIA.   DATA Today's carotid Duplex suggests <40% right ICA stenosis and patent left carotid endarterectomy site with no evidence of hyperplasia or restenosis.  Bilateral vertebral artery flow is antegrade.  Bilateral subclavian artery waveforms are normal.  No significant change in comparison to the exams on 11/28/2013, 06/27/14, and 06-29-15.   Plan: Follow-up in 1 year with Carotid Duplex scan.   I discussed in depth with the patient the nature of atherosclerosis, and emphasized the importance of maximal medical management  including strict control of blood pressure, blood glucose, and lipid levels, obtaining regular exercise, and continued cessation of smoking.  The patient is aware that without maximal medical management the underlying atherosclerotic disease process will progress, limiting the benefit of any interventions. The patient was given information about stroke prevention and what symptoms should prompt the patient to seek immediate medical care. Thank you for allowing [place] to participate in this patient's care.  Charisse March, RN, MSN, FNP-C [facility] Office: [phone]  Clinic Physician: Darrick Penna  06/30/16 1:41 PM

## 2016-06-30 NOTE — Patient Instructions (Signed)
Stroke Prevention Some medical conditions and behaviors are associated with an increased chance of having a stroke. You may prevent a stroke by making healthy choices and managing medical conditions. How can I reduce my risk of having a stroke?  Stay physically active. Get at least 30 minutes of activity on most or all days.  Do not smoke. It may also be helpful to avoid exposure to secondhand smoke.  Limit alcohol use. Moderate alcohol use is considered to be:  No more than 2 drinks per day for men.  No more than 1 drink per day for nonpregnant women.  Eat healthy foods. This involves:  Eating 5 or more servings of fruits and vegetables a day.  Making dietary changes that address high blood pressure (hypertension), high cholesterol, diabetes, or obesity.  Manage your cholesterol levels.  Making food choices that are high in fiber and low in saturated fat, trans fat, and cholesterol may control cholesterol levels.  Take any prescribed medicines to control cholesterol as directed by your health care provider.  Manage your diabetes.  Controlling your carbohydrate and sugar intake is recommended to manage diabetes.  Take any prescribed medicines to control diabetes as directed by your health care provider.  Control your hypertension.  Making food choices that are low in salt (sodium), saturated fat, trans fat, and cholesterol is recommended to manage hypertension.  Ask your health care provider if you need treatment to lower your blood pressure. Take any prescribed medicines to control hypertension as directed by your health care provider.  If you are 18-39 years of age, have your blood pressure checked every 3-5 years. If you are 40 years of age or older, have your blood pressure checked every year.  Maintain a healthy weight.  Reducing calorie intake and making food choices that are low in sodium, saturated fat, trans fat, and cholesterol are recommended to manage  weight.  Stop drug abuse.  Avoid taking birth control pills.  Talk to your health care provider about the risks of taking birth control pills if you are over 35 years old, smoke, get migraines, or have ever had a blood clot.  Get evaluated for sleep disorders (sleep apnea).  Talk to your health care provider about getting a sleep evaluation if you snore a lot or have excessive sleepiness.  Take medicines only as directed by your health care provider.  For some people, aspirin or blood thinners (anticoagulants) are helpful in reducing the risk of forming abnormal blood clots that can lead to stroke. If you have the irregular heart rhythm of atrial fibrillation, you should be on a blood thinner unless there is a good reason you cannot take them.  Understand all your medicine instructions.  Make sure that other conditions (such as anemia or atherosclerosis) are addressed. Get help right away if:  You have sudden weakness or numbness of the face, arm, or leg, especially on one side of the body.  Your face or eyelid droops to one side.  You have sudden confusion.  You have trouble speaking (aphasia) or understanding.  You have sudden trouble seeing in one or both eyes.  You have sudden trouble walking.  You have dizziness.  You have a loss of balance or coordination.  You have a sudden, severe headache with no known cause.  You have new chest pain or an irregular heartbeat. Any of these symptoms may represent a serious problem that is an emergency. Do not wait to see if the symptoms will go away.   Get medical help at once. Call your local emergency services (911 in [place] Do not drive yourself to the hospital. This information is not intended to replace advice given to you by your health care provider. Make sure you discuss any questions you have with your health care provider. Document Released: 03/03/2004 Document Revised: 07/02/2015 Document Reviewed: 07/27/2012 Elsevier  Interactive Patient Education  2017 Elsevier Inc.      Preventing Cerebrovascular Disease Arteries are blood vessels that carry blood that contains oxygen from the heart to all parts of the body. Cerebrovascular disease affects arteries that supply the brain. Any condition that blocks or disrupts blood flow to the brain can cause cerebrovascular disease. Brain cells that lose blood supply start to die within minutes (stroke). Stroke is the main danger of cerebrovascular disease. Atherosclerosis and high blood pressure are common causes of cerebrovascular disease. Atherosclerosis is narrowing and hardening of an artery that results when fat, cholesterol, calcium, or other substances (plaque) build up inside an artery. Plaque reduces blood flow through the artery. High blood pressure increases the risk of bleeding inside the brain. Making diet and lifestyle changes to prevent atherosclerosis and high blood pressure lowers your risk of cerebrovascular disease. What nutrition changes can be made?  Eat more fruits, vegetables, and whole grains.  Reduce how much saturated fat you eat. To do this, eat less red meat and fewer full-fat dairy products.  Eat healthy proteins instead of red meat. Healthy proteins include:  Fish. Eat fish that contains heart-healthy omega-3 fatty acids, twice a week. Examples include salmon, albacore tuna, mackerel, and herring.  Chicken.  Nuts.  Low-fat or nonfat yogurt.  Avoid processed meats, like bacon and lunchmeat.  Avoid foods that contain:  A lot of sugar, such as sweets and drinks with added sugar.  A lot of salt (sodium). Avoid adding extra salt to your food, as told by your health care provider.  Trans fats, such as margarine and baked goods. Trans fats may be listed as "partially hydrogenated oils" on food labels.  Check food labels to see how much sodium, sugar, and trans fats are in foods.  Use vegetable oils that contain low amounts of  saturated fat, such as olive oil or canola oil. What lifestyle changes can be made?  Drink alcohol in moderation. This means no more than 1 drink a day for nonpregnant women and 2 drinks a day for men. One drink equals 12 oz of beer, 5 oz of wine, or 1 oz of hard liquor.  If you are overweight, ask your health care provider to recommend a weight-loss plan for you. Losing 5-10 lb (2.2-4.5 kg) can reduce your risk of diabetes, atherosclerosis, and high blood pressure.  Exercise for 30?60 minutes on most days, or as much as told by your health care provider.  Do moderate-intensity exercise, such as brisk walking, bicycling, and water aerobics. Ask your health care provider which activities are safe for you.  Do not use any products that contain nicotine or tobacco, such as cigarettes and e-cigarettes. If you need help quitting, ask your health care provider. Why are these changes important? Making these changes lowers your risk of many diseases that can cause cerebrovascular disease and stroke. Stroke is a leading cause of death and disability. Making these changes also improves your overall health and quality of life. What can I do to lower my risk? The following factors make you more likely to develop cerebrovascular disease:  Being overweight.  Smoking.  Being physically   inactive.  Eating a high-fat diet.  Having certain health conditions, such as:  Diabetes.  High blood pressure.  Heart disease.  Atherosclerosis.  High cholesterol.  Sickle cell disease. Talk with your health care provider about your risk for cerebrovascular disease. Work with your health care provider to control diseases that you have that may contribute to cerebrovascular disease. Your health care provider may prescribe medicines to help prevent major causes of cerebrovascular disease. Where to find more information: Learn more about preventing cerebrovascular disease from:  National Heart, Lung, and  Blood Institute: www.nhlbi.nih.gov/health/health-topics/topics/stroke  Centers for Disease Control and Prevention: cdc.gov/stroke/about.htm Summary  Cerebrovascular disease can lead to a stroke.  Atherosclerosis and high blood pressure are major causes of cerebrovascular disease.  Making diet and lifestyle changes can reduce your risk of cerebrovascular disease.  Work with your health care provider to get your risk factors under control to reduce your risk of cerebrovascular disease. This information is not intended to replace advice given to you by your health care provider. Make sure you discuss any questions you have with your health care provider. Document Released: 02/08/2015 Document Revised: 08/14/2015 Document Reviewed: 02/08/2015 Elsevier Interactive Patient Education  2017 Elsevier Inc.  

## 2016-07-07 ENCOUNTER — Encounter: Payer: Self-pay | Admitting: Family

## 2016-07-08 ENCOUNTER — Ambulatory Visit (INDEPENDENT_AMBULATORY_CARE_PROVIDER_SITE_OTHER): Payer: PPO | Admitting: Cardiovascular Disease

## 2016-07-08 ENCOUNTER — Encounter: Payer: Self-pay | Admitting: Cardiovascular Disease

## 2016-07-08 VITALS — BP 156/72 | Ht 66.0 in | Wt 160.4 lb

## 2016-07-08 DIAGNOSIS — I6522 Occlusion and stenosis of left carotid artery: Secondary | ICD-10-CM | POA: Diagnosis not present

## 2016-07-08 DIAGNOSIS — I10 Essential (primary) hypertension: Secondary | ICD-10-CM

## 2016-07-08 DIAGNOSIS — E7800 Pure hypercholesterolemia, unspecified: Secondary | ICD-10-CM | POA: Diagnosis not present

## 2016-07-08 NOTE — Progress Notes (Signed)
07/08/2016 David Sellers   02-Aug-1940  [id number]  Primary Physician David Gosselin, MD Primary Cardiologist: David Gess MD David Sellers  HPI:  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 Fabienne Sellers for cardiovascular evaluation and preoperative screening prior to elective left carotid endarterectomy. I last saw him in the office 06/26/15. His cardiovascular factor profile is positive for hypertension, dyslipidemia, and family history of heart disease with a father who died of a myocardial infarction in his 61s. He has never had a heart attack or stroke. He denies chest pain or shortness of breath. He did have pain in his left neck and had incidentally found moderate left internal carotid artery stenosis. A Myoview stress test performed for preoperative clearance 04/23/13 was entirely normal. He underwent uncomplicated left carotid endarterectomy by Dr. Darrick Sellers on 05/08/13 with excellent operative result although the patient does have some left facial numbness as result. SinceI saw him a year ago he has remained stable.   Current Outpatient Prescriptions  Medication Sig Dispense Refill  . acetaminophen (TYLENOL) 650 MG CR tablet Take 1,300 mg by mouth every 8 (eight) hours as needed for pain.    Marland Kitchen aspirin EC 81 MG tablet Take 81 mg by mouth every morning.    Marland Kitchen doxazosin (CARDURA) 8 MG tablet Take 8 mg by mouth every evening.     . hydrochlorothiazide (MICROZIDE) 12.5 MG capsule Take 6.25 mg by mouth daily.     Marland Kitchen KRILL OIL PO Take 1 capsule by mouth 2 (two) times daily.     Marland Kitchen lisinopril (PRINIVIL,ZESTRIL) 30 MG tablet Take 1 tablet by mouth 2 (two) times daily.  1  . loratadine (CLARITIN) 10 MG tablet Take 10 mg by mouth daily. Reported on 06/29/2015    . lovastatin (MEVACOR) 20 MG tablet Take 20 mg by mouth at bedtime.    Marland Kitchen omeprazole (PRILOSEC) 20 MG capsule Take 20 mg by  mouth every morning.    . verapamil (CALAN) 120 MG tablet Take 120 mg by mouth once.     . verapamil (COVERA HS) 240 MG (CO) 24 hr tablet Take 240 mg by mouth every morning.     No current facility-administered medications for this visit.     Allergies  Allergen Reactions  . Meloxicam Itching, Palpitations and Other (See Comments)    Headaches, Mood swings.  . Morphine And Related Nausea And Vomiting  . Statins Other (See Comments)    Severe arthritic response  . Cholestoff [Plant Sterols And Stanols] Other (See Comments)    Muscle aches  . Nsaids Other (See Comments)    Aleve ,  Myalgia  . Atorvastatin     Severe myalgias  . Darvon [Propoxyphene] Other (See Comments)    hallucinate  . Indomethacin     Neck pain flare  . Penicillins     *positive allergy test*  . Pravastatin     myalgias  . Sulfa Antibiotics Nausea And Vomiting  . Tramadol     Social History   Social History  . Marital status: Married    Spouse name: N/A  . Number of children: N/A  . Years of education: N/A   Occupational History  . Not on file.   Social History Main Topics  . Smoking status: Never Smoker  . Smokeless tobacco: Never Used  . Alcohol use No  . Drug use: No  .  Sexual activity: Yes   Other Topics Concern  . Not on file   Social History Narrative  . No narrative on file     Review of Systems: General: negative for chills, fever, night sweats or weight changes.  Cardiovascular: negative for chest pain, dyspnea on exertion, edema, orthopnea, palpitations, paroxysmal nocturnal dyspnea or shortness of breath Dermatological: negative for rash Respiratory: negative for cough or wheezing Urologic: negative for hematuria Abdominal: negative for nausea, vomiting, diarrhea, bright red blood per rectum, melena, or hematemesis Neurologic: negative for visual changes, syncope, or dizziness All other systems reviewed and are otherwise negative except as noted above.    Blood pressure  (!) 156/72, height 5\' 6"  (1.676 m), weight 160 lb 6.4 oz (72.8 kg).  General appearance: alert and no distress Neck: no adenopathy, no carotid bruit, no JVD, supple, symmetrical, trachea midline and thyroid not enlarged, symmetric, no tenderness/mass/nodules Lungs: clear to auscultation bilaterally Heart: regular rate and rhythm, S1, S2 normal, no murmur, click, rub or gallop Extremities: extremities normal, atraumatic, no cyanosis or edema  EKG sinus rhythm at 65 without ST or T-wave changes. I personally reviewed this EKG  ASSESSMENT AND PLAN:   Essential hypertension History of essential hypertension blood pressures measured today 156/72. He is on hydrochlorothiazide and verapamil as well as lisinopril. Continue current meds at current dosing  Hyperlipidemia History of hyperlipidemia on lovastatin followed by his PCP  Carotid artery stenosis History of carotid artery disease status post uncomplicated elective left carotid endarterectomy performed by Dr. Darrick Sellers for/1/15. He gets duplex and clinical follow-up in Dr. Sonia Sellers'office.      David GessJonathan J. Berry MD FACP,FACC,FAHA, [facility] 07/08/2016 11:53 AM

## 2016-07-08 NOTE — Patient Instructions (Signed)

## 2016-07-08 NOTE — Assessment & Plan Note (Signed)
History of essential hypertension blood pressures measured today 156/72. He is on hydrochlorothiazide and verapamil as well as lisinopril. Continue current meds at current dosing

## 2016-07-08 NOTE — Assessment & Plan Note (Signed)
History of hyperlipidemia on lovastatin followed by his PCP 

## 2016-07-08 NOTE — Assessment & Plan Note (Signed)
History of carotid artery disease status post uncomplicated elective left carotid endarterectomy performed by Dr. Darrick PennaFields for/1/15. He gets duplex and clinical follow-up in Dr. Sonia SideFields'office.

## 2016-07-12 DIAGNOSIS — R1030 Lower abdominal pain, unspecified: Secondary | ICD-10-CM | POA: Diagnosis not present

## 2016-07-12 DIAGNOSIS — R351 Nocturia: Secondary | ICD-10-CM | POA: Diagnosis not present

## 2016-07-12 DIAGNOSIS — M6281 Muscle weakness (generalized): Secondary | ICD-10-CM | POA: Diagnosis not present

## 2016-07-12 DIAGNOSIS — N50812 Left testicular pain: Secondary | ICD-10-CM | POA: Diagnosis not present

## 2016-07-12 DIAGNOSIS — R3915 Urgency of urination: Secondary | ICD-10-CM | POA: Diagnosis not present

## 2016-07-12 DIAGNOSIS — M62838 Other muscle spasm: Secondary | ICD-10-CM | POA: Diagnosis not present

## 2016-07-15 DIAGNOSIS — I10 Essential (primary) hypertension: Secondary | ICD-10-CM | POA: Diagnosis not present

## 2016-07-15 DIAGNOSIS — E119 Type 2 diabetes mellitus without complications: Secondary | ICD-10-CM | POA: Diagnosis not present

## 2016-07-15 NOTE — Addendum Note (Signed)
Addended by: Burton ApleyPETTY, Maayan Jenning A on: 07/15/2016 09:04 AM   Modules accepted: Orders

## 2016-07-20 DIAGNOSIS — H6120 Impacted cerumen, unspecified ear: Secondary | ICD-10-CM | POA: Diagnosis not present

## 2016-07-20 DIAGNOSIS — K219 Gastro-esophageal reflux disease without esophagitis: Secondary | ICD-10-CM | POA: Diagnosis not present

## 2016-07-20 DIAGNOSIS — E119 Type 2 diabetes mellitus without complications: Secondary | ICD-10-CM | POA: Diagnosis not present

## 2016-07-20 DIAGNOSIS — I10 Essential (primary) hypertension: Secondary | ICD-10-CM | POA: Diagnosis not present

## 2016-07-20 DIAGNOSIS — E782 Mixed hyperlipidemia: Secondary | ICD-10-CM | POA: Diagnosis not present

## 2016-07-20 DIAGNOSIS — R972 Elevated prostate specific antigen [PSA]: Secondary | ICD-10-CM | POA: Diagnosis not present

## 2016-07-20 DIAGNOSIS — I6529 Occlusion and stenosis of unspecified carotid artery: Secondary | ICD-10-CM | POA: Diagnosis not present

## 2016-07-20 DIAGNOSIS — Z0000 Encounter for general adult medical examination without abnormal findings: Secondary | ICD-10-CM | POA: Diagnosis not present

## 2016-08-04 DIAGNOSIS — N50812 Left testicular pain: Secondary | ICD-10-CM | POA: Diagnosis not present

## 2016-08-04 DIAGNOSIS — R3915 Urgency of urination: Secondary | ICD-10-CM | POA: Diagnosis not present

## 2016-08-04 DIAGNOSIS — M62838 Other muscle spasm: Secondary | ICD-10-CM | POA: Diagnosis not present

## 2016-08-04 DIAGNOSIS — R1030 Lower abdominal pain, unspecified: Secondary | ICD-10-CM | POA: Diagnosis not present

## 2016-08-04 DIAGNOSIS — M6281 Muscle weakness (generalized): Secondary | ICD-10-CM | POA: Diagnosis not present

## 2016-11-26 DIAGNOSIS — R0789 Other chest pain: Secondary | ICD-10-CM | POA: Diagnosis not present

## 2017-01-23 DIAGNOSIS — K219 Gastro-esophageal reflux disease without esophagitis: Secondary | ICD-10-CM | POA: Diagnosis not present

## 2017-01-23 DIAGNOSIS — Z0000 Encounter for general adult medical examination without abnormal findings: Secondary | ICD-10-CM | POA: Diagnosis not present

## 2017-01-23 DIAGNOSIS — I6529 Occlusion and stenosis of unspecified carotid artery: Secondary | ICD-10-CM | POA: Diagnosis not present

## 2017-01-23 DIAGNOSIS — R972 Elevated prostate specific antigen [PSA]: Secondary | ICD-10-CM | POA: Diagnosis not present

## 2017-01-23 DIAGNOSIS — E119 Type 2 diabetes mellitus without complications: Secondary | ICD-10-CM | POA: Diagnosis not present

## 2017-01-23 DIAGNOSIS — E782 Mixed hyperlipidemia: Secondary | ICD-10-CM | POA: Diagnosis not present

## 2017-01-23 DIAGNOSIS — I10 Essential (primary) hypertension: Secondary | ICD-10-CM | POA: Diagnosis not present

## 2017-02-14 DIAGNOSIS — I10 Essential (primary) hypertension: Secondary | ICD-10-CM | POA: Diagnosis not present

## 2017-02-14 DIAGNOSIS — H6123 Impacted cerumen, bilateral: Secondary | ICD-10-CM | POA: Diagnosis not present

## 2017-02-14 DIAGNOSIS — R05 Cough: Secondary | ICD-10-CM | POA: Diagnosis not present

## 2017-02-23 DIAGNOSIS — L0202 Furuncle of face: Secondary | ICD-10-CM | POA: Diagnosis not present

## 2017-04-24 DIAGNOSIS — H903 Sensorineural hearing loss, bilateral: Secondary | ICD-10-CM | POA: Diagnosis not present

## 2017-04-27 DIAGNOSIS — H903 Sensorineural hearing loss, bilateral: Secondary | ICD-10-CM | POA: Diagnosis not present

## 2017-05-17 DIAGNOSIS — R197 Diarrhea, unspecified: Secondary | ICD-10-CM | POA: Diagnosis not present

## 2017-07-09 DIAGNOSIS — J309 Allergic rhinitis, unspecified: Secondary | ICD-10-CM | POA: Diagnosis not present

## 2017-07-09 DIAGNOSIS — J069 Acute upper respiratory infection, unspecified: Secondary | ICD-10-CM | POA: Diagnosis not present

## 2017-07-11 ENCOUNTER — Ambulatory Visit: Payer: PPO | Admitting: Cardiovascular Disease

## 2017-07-11 ENCOUNTER — Encounter: Payer: Self-pay | Admitting: Cardiovascular Disease

## 2017-07-11 VITALS — BP 152/84 | HR 76 | Ht 66.0 in | Wt 164.8 lb

## 2017-07-11 DIAGNOSIS — E7800 Pure hypercholesterolemia, unspecified: Secondary | ICD-10-CM | POA: Diagnosis not present

## 2017-07-11 DIAGNOSIS — I10 Essential (primary) hypertension: Secondary | ICD-10-CM

## 2017-07-11 NOTE — Patient Instructions (Signed)

## 2017-07-11 NOTE — Assessment & Plan Note (Signed)
History of hyperlipidemia on statin therapy. 

## 2017-07-11 NOTE — Assessment & Plan Note (Signed)
Carotid artery disease carotid endarterectomy performed by Dr. Darrick PennaFields 05/08/2013 which he follows.

## 2017-07-11 NOTE — Assessment & Plan Note (Signed)
History of essential hypertension her blood pressure measured today at 160/84.  He is on hydrochlorothiazide, verapamil and losartan.  Continue current meds at current dosing.

## 2017-07-11 NOTE — Progress Notes (Signed)
07/11/2017 David Sellers   April 27, 1940  [id number]  Primary Physician Catha GosselinLittle, Kevin, MD Primary Cardiologist: Runell GessJonathan J Merrell Borsuk MD Nicholes CalamityFACP, FACC, FAHA, [place]  HPI:  David Sellers is a 77 y.o.  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 Fabienne Brunsharles Fields for cardiovascular evaluation and preoperative screening prior to elective left carotid endarterectomy. I last saw him in the office  07/08/2016. His cardiovascular factor profile is positive for hypertension, dyslipidemia, and family history of heart disease with a father who died of a myocardial infarction in his 6660s. He has never had a heart attack or stroke. He denies chest pain or shortness of breath. He did have pain in his left neck and had incidentally found moderate left internal carotid artery stenosis. A Myoview stress test performed for preoperative clearance 04/23/13 was entirely normal. He underwent uncomplicated left carotid endarterectomy by Dr. Darrick PennaFields on 05/08/13 with excellent operative result although the patient does have some left facial numbness as result. SinceI saw him a year ago he has remained stable. He is been under a lot of stress lately with his wife having kidney infection him having upper respiratory tract infection although he checks his blood pressure at home and it runs in the 135/70 range.  Complained of some left jaw pain but denies chest pain.      Current Meds  Medication Sig  . acetaminophen (TYLENOL) 650 MG CR tablet Take 1,300 mg by mouth every 8 (eight) hours as needed for pain.  Marland Kitchen. aspirin EC 81 MG tablet Take 81 mg by mouth every morning.  Marland Kitchen. doxazosin (CARDURA) 8 MG tablet Take 8 mg by mouth every evening.   . hydrochlorothiazide (MICROZIDE) 12.5 MG capsule Take 6.25 mg by mouth daily.   Marland Kitchen. KRILL OIL PO Take 1 capsule by mouth 2 (two) times daily.   Marland Kitchen. loratadine (CLARITIN) 10 MG tablet Take 10 mg by mouth daily. Reported on 06/29/2015  . losartan  (COZAAR) 100 MG tablet Take 100 mg by mouth daily.  Marland Kitchen. lovastatin (MEVACOR) 20 MG tablet Take 20 mg by mouth at bedtime.  Marland Kitchen. omeprazole (PRILOSEC) 20 MG capsule Take 20 mg by mouth every morning.  . verapamil (CALAN) 120 MG tablet Take 120 mg by mouth once.   . verapamil (COVERA HS) 240 MG (CO) 24 hr tablet Take 240 mg by mouth every morning.  . [DISCONTINUED] lisinopril (PRINIVIL,ZESTRIL) 30 MG tablet Take 1 tablet by mouth 2 (two) times daily.     Allergies  Allergen Reactions  . Meloxicam Itching, Palpitations and Other (See Comments)    Headaches, Mood swings.  . Morphine And Related Nausea And Vomiting  . Statins Other (See Comments)    Severe arthritic response  . Cholestoff [Plant Sterols And Stanols] Other (See Comments)    Muscle aches  . Nsaids Other (See Comments)    Aleve ,  Myalgia  . Atorvastatin     Severe myalgias  . Darvon [Propoxyphene] Other (See Comments)    hallucinate  . Indomethacin     Neck pain flare  . Penicillins     *positive allergy test*  . Pravastatin     myalgias  . Sulfa Antibiotics Nausea And Vomiting  . Tramadol     Social History   Socioeconomic History  . Marital status: Married    Spouse name: Not on file  . Number of children: Not on file  . Years of education: Not on file  .  Highest education level: Not on file  Occupational History  . Not on file  Social Needs  . Financial resource strain: Not on file  . Food insecurity:    Worry: Not on file    Inability: Not on file  . Transportation needs:    Medical: Not on file    Non-medical: Not on file  Tobacco Use  . Smoking status: Never Smoker  . Smokeless tobacco: Never Used  Substance and Sexual Activity  . Alcohol use: No  . Drug use: No  . Sexual activity: Yes  Lifestyle  . Physical activity:    Days per week: Not on file    Minutes per session: Not on file  . Stress: Not on file  Relationships  . Social connections:    Talks on phone: Not on file    Gets together:  Not on file    Attends religious service: Not on file    Active member of club or organization: Not on file    Attends meetings of clubs or organizations: Not on file    Relationship status: Not on file  . Intimate partner violence:    Fear of current or ex partner: Not on file    Emotionally abused: Not on file    Physically abused: Not on file    Forced sexual activity: Not on file  Other Topics Concern  . Not on file  Social History Narrative  . Not on file     Review of Systems: General: negative for chills, fever, night sweats or weight changes.  Cardiovascular: negative for chest pain, dyspnea on exertion, edema, orthopnea, palpitations, paroxysmal nocturnal dyspnea or shortness of breath Dermatological: negative for rash Respiratory: negative for cough or wheezing Urologic: negative for hematuria Abdominal: negative for nausea, vomiting, diarrhea, bright red blood per rectum, melena, or hematemesis Neurologic: negative for visual changes, syncope, or dizziness All other systems reviewed and are otherwise negative except as noted above.    Blood pressure (!) 160/84, pulse 76, height 5\' 6"  (1.676 m), weight 164 lb 12.8 oz (74.8 kg).  General appearance: alert and no distress Neck: no adenopathy, no carotid bruit, no JVD, supple, symmetrical, trachea midline and thyroid not enlarged, symmetric, no tenderness/mass/nodules Lungs: clear to auscultation bilaterally Heart: regular rate and rhythm, S1, S2 normal, no murmur, click, rub or gallop Extremities: extremities normal, atraumatic, no cyanosis or edema Pulses: 2+ and symmetric Skin: Skin color, texture, turgor normal. No rashes or lesions Neurologic: Alert and oriented X 3, normal strength and tone. Normal symmetric reflexes. Normal coordination and gait  EKG normal sinus rhythm at 76 with nonspecific ST and T wave changes.  I personally reviewed the EKG  ASSESSMENT AND PLAN:   Occlusion and stenosis of carotid artery  without mention of cerebral infarction Carotid artery disease carotid endarterectomy performed by Dr. Darrick Penna 05/08/2013 which he follows.  Essential hypertension History of essential hypertension her blood pressure measured today at 160/84.  He is on hydrochlorothiazide, verapamil and losartan.  Continue current meds at current dosing.  Hyperlipidemia History of hyperlipidemia on statin therapy.      Runell Gess MD FACP,FACC,FAHA, [facility] 07/11/2017 9:48 AM

## 2017-07-13 ENCOUNTER — Ambulatory Visit (HOSPITAL_COMMUNITY)
Admission: RE | Admit: 2017-07-13 | Discharge: 2017-07-13 | Disposition: A | Discharge status: Home / Self Care | Payer: PPO | Source: Ambulatory Visit | Attending: Family | Admitting: Family

## 2017-07-13 ENCOUNTER — Encounter: Payer: Self-pay | Admitting: Family

## 2017-07-13 ENCOUNTER — Ambulatory Visit: Payer: PPO | Admitting: Family

## 2017-07-13 VITALS — BP 192/93 | HR 62 | Temp 97.3°F | Resp 14 | Ht 66.0 in | Wt 164.1 lb

## 2017-07-13 DIAGNOSIS — Z9889 Other specified postprocedural states: Secondary | ICD-10-CM | POA: Diagnosis not present

## 2017-07-13 DIAGNOSIS — I10 Essential (primary) hypertension: Secondary | ICD-10-CM | POA: Diagnosis not present

## 2017-07-13 DIAGNOSIS — I6522 Occlusion and stenosis of left carotid artery: Secondary | ICD-10-CM | POA: Diagnosis not present

## 2017-07-13 DIAGNOSIS — E785 Hyperlipidemia, unspecified: Secondary | ICD-10-CM | POA: Diagnosis not present

## 2017-07-13 NOTE — Progress Notes (Signed)
Chief Complaint: Follow up Extracranial Carotid Artery Stenosis   History of Present Illness  David Sellers is a 77 y.o. male who is s/p left carotid endarterectomy on 05/08/13 by Dr. Darrick Penna, returns today for follow up.  The patient denies any history of TIA or stroke symptoms, specifically he denies a history of amaurosis fugax or monocular blindness, unilateral facial drooping, hemiplegia, or receptive or expressive aphasia.   He denies claudication type symptoms with walking, denies non healing wounds.  He is a Optician, dispensing, still works full time in a [employer].  He is quite active physically.   Pt checks his blood pressure at home and it runs 130's/70's, was 136/77 this morning. He is hypertensive now, denies chest pain, denies dyspnea, denies feeling light headed, states recurrent headache from his stiff neck.   Pt Diabetic: diet controlled Pt smoker: non-smoker  Pt meds include: Statin : yes ASA: Yes Other anticoagulants/antiplatelets: no    Past Medical History:  Diagnosis Date  . Arthritis   . Asthma    only has flareup with smelly perfume   . BPH (benign prostatic hyperplasia)   . Carotid artery occlusion    right ICA 50% stenosis  . Carotodynia   . Cataract    immature to the right eye  . Diabetes mellitus without complication (HCC)    borderline  . GERD (gastroesophageal reflux disease)    takes Omeprazole daily  . Headache(784.0)    r/t arthritis in neck   . Hemorrhoids   . History of bronchitis    last time 10-73yrs ago  . History of colon polyps   . Hyperlipidemia    no meds since Jan 1  . Hypertension    takes Verapamil,Lisinopril,and Cardura daily  . Joint pain   . Legally blind in left eye, as defined in [place]    hit by a baseball  . Low back pain    reason unknown  . Urinary frequency    sees Dr.Tannenbaum    Social History Social History   Tobacco Use  . Smoking status: Never Smoker  . Smokeless tobacco: Never Used  Substance  Use Topics  . Alcohol use: No  . Drug use: No    Family History Family History  Adopted: Yes  Problem Relation Age of Onset  . Heart disease Father   . Heart attack Father 78  . Hypertension Father   . Hyperlipidemia Father   . Diabetes Daughter   . Hyperlipidemia Daughter   . Hypertension Daughter   . Diabetes Daughter   . Hyperlipidemia Daughter   . Hypertension Daughter   . Kidney disease Mother     Surgical History Past Surgical History:  Procedure Laterality Date  . CAROTID ENDARTERECTOMY Left 05/08/13  . COLONOSCOPY    . EGD with dilitation    . ENDARTERECTOMY Left 05/08/2013   Procedure: LEFT CAROTID ARTERY ENDARTERECTOMY WITH DACRON PATCH ANGIOPLASTY;  Surgeon: Sherren Kerns, MD;  Location: [facility] OR;  Service: Vascular;  Laterality: Left;  . EYE SURGERY    . HERNIA REPAIR     x 2  . RETINAL DETACHMENT SURGERY    . retinal laser surgery Left    plate placed    Allergies  Allergen Reactions  . Meloxicam Itching, Palpitations and Other (See Comments)    Headaches, Mood swings.  . Morphine And Related Nausea And Vomiting  . Statins Other (See Comments)    Severe arthritic response  . Cholestoff [Plant Sterols And Stanols] Other (See  Comments)    Muscle aches  . Nsaids Other (See Comments)    Aleve ,  Myalgia  . Atorvastatin     Severe myalgias  . Darvon [Propoxyphene] Other (See Comments)    hallucinate  . Indomethacin     Neck pain flare  . Penicillins     *positive allergy test*  . Pravastatin     myalgias  . Sulfa Antibiotics Nausea And Vomiting  . Tramadol     Current Outpatient Medications  Medication Sig Dispense Refill  . acetaminophen (TYLENOL) 650 MG CR tablet Take 1,300 mg by mouth every 8 (eight) hours as needed for pain.    Marland Kitchen. aspirin EC 81 MG tablet Take 81 mg by mouth every morning.    Marland Kitchen. doxazosin (CARDURA) 8 MG tablet Take 8 mg by mouth every evening.     . hydrochlorothiazide (MICROZIDE) 12.5 MG capsule Take 6.25 mg by mouth daily.      Marland Kitchen. KRILL OIL PO Take 1 capsule by mouth 2 (two) times daily.     Marland Kitchen. loratadine (CLARITIN) 10 MG tablet Take 10 mg by mouth daily. Reported on 06/29/2015    . losartan (COZAAR) 100 MG tablet Take 100 mg by mouth daily.  6  . lovastatin (MEVACOR) 20 MG tablet Take 20 mg by mouth at bedtime.    Marland Kitchen. omeprazole (PRILOSEC) 20 MG capsule Take 20 mg by mouth every morning.    . verapamil (CALAN) 120 MG tablet Take 120 mg by mouth once.     . verapamil (COVERA HS) 240 MG (CO) 24 hr tablet Take 240 mg by mouth every morning.     No current facility-administered medications for this visit.     Review of Systems : See HPI for pertinent positives and negatives.  Physical Examination  Vitals:   07/13/17 1336 07/13/17 1339  BP: (!) 192/93 (!) 192/93  Pulse: 62   Resp: 14   Temp: (!) 97.3 F (36.3 C)   TempSrc: Oral   SpO2: 97%   Weight: 164 lb 1.6 oz (74.4 kg)   Height: 5\' 6"  (1.676 m)    Body mass index is 26.49 kg/m.  General: WDWN male in NAD GAIT:normal Eyes: Right pupil reacts to light normally; left pupil is dilated and clouded (injured in 1992). Pulmonary: Non-labored respirations, CTAB. Cardiac: regular rhythm,no detected murmur.  VASCULAR EXAM Carotid Bruits Right Left   Negative Negative   Radial pulses are 2+ palpable and equal. Abdominal aortic pulse is not palpable       LE Pulses Right Left   POPLITEAL not palpable  not palpable   POSTERIOR TIBIAL  2+palpable   2+palpable    DORSALIS PEDIS  ANTERIOR TIBIAL not palpable  not palpable    Gastrointestinal: soft, nontender, BS WNL, no r/g,no palpated masses. Musculoskeletal: No muscle atrophy/wasting. M/S 5/5 throughout, Extremities without ischemic changes. Skin: No rashes, no ulcers, no cellulitis.  Thick long toenails.   Neurologic:  A&O X 3; appropriate affect, sensation is normal; speech is normal, CN 2-12 intact except for left pupil as above, pain and light touch intact in extremities, motor exam as listed above. Psychiatric: Normal thought content, mood appropriate to clinical situation.    Assessment: David Sellers is a 77 y.o. male who whois s/p left carotid endarterectomy on 05/08/13. He has no history of stroke or TIA.  He has never used tobacco and has diet controlled DM.  He takes a daily ASA and a statin.  He stays physically active.  DATA Carotid Duplex (07/13/17): Right ICA: 1-39% stenosis Left ICA: CEA site with no restenosis. Bilateral vertebral artery flow is antegrade.  Bilateral subclavian artery waveforms are normal.  No significant change in comparison to the exams on 11/28/2013, 06/27/14,06-29-15, and 06-30-16.    Plan:  Follow-up in 1year with Carotid Duplex scan.  I discussed in depth with the patient the nature of atherosclerosis, and emphasized the importance of maximal medical management including strict control of blood pressure, blood glucose, and lipid levels, obtaining regular exercise, and continued cessation of smoking.  The patient is aware that without maximal medical management the underlying atherosclerotic disease process will progress, limiting the benefit of any interventions. The patient was given information about stroke prevention and what symptoms should prompt the patient to seek immediate medical care. Thank you for allowing [place] to participate in this patient's care.  Charisse March, RN, MSN, FNP-C [facility] Office: [phone]  Clinic Physician: Darrick Penna  07/13/17 1:51 PM

## 2017-07-13 NOTE — Patient Instructions (Signed)

## 2017-07-24 DIAGNOSIS — I10 Essential (primary) hypertension: Secondary | ICD-10-CM | POA: Diagnosis not present

## 2017-07-24 DIAGNOSIS — E119 Type 2 diabetes mellitus without complications: Secondary | ICD-10-CM | POA: Diagnosis not present

## 2017-07-24 DIAGNOSIS — E782 Mixed hyperlipidemia: Secondary | ICD-10-CM | POA: Diagnosis not present

## 2017-07-27 DIAGNOSIS — H6123 Impacted cerumen, bilateral: Secondary | ICD-10-CM | POA: Diagnosis not present

## 2017-07-27 DIAGNOSIS — E782 Mixed hyperlipidemia: Secondary | ICD-10-CM | POA: Diagnosis not present

## 2017-07-27 DIAGNOSIS — E1169 Type 2 diabetes mellitus with other specified complication: Secondary | ICD-10-CM | POA: Diagnosis not present

## 2017-07-27 DIAGNOSIS — H6120 Impacted cerumen, unspecified ear: Secondary | ICD-10-CM | POA: Diagnosis not present

## 2017-07-27 DIAGNOSIS — I6529 Occlusion and stenosis of unspecified carotid artery: Secondary | ICD-10-CM | POA: Diagnosis not present

## 2017-07-27 DIAGNOSIS — I10 Essential (primary) hypertension: Secondary | ICD-10-CM | POA: Diagnosis not present

## 2017-07-27 DIAGNOSIS — R972 Elevated prostate specific antigen [PSA]: Secondary | ICD-10-CM | POA: Diagnosis not present

## 2017-07-27 DIAGNOSIS — N401 Enlarged prostate with lower urinary tract symptoms: Secondary | ICD-10-CM | POA: Diagnosis not present

## 2017-07-27 DIAGNOSIS — Z0000 Encounter for general adult medical examination without abnormal findings: Secondary | ICD-10-CM | POA: Diagnosis not present

## 2017-11-23 DIAGNOSIS — H02051 Trichiasis without entropian right upper eyelid: Secondary | ICD-10-CM | POA: Diagnosis not present

## 2017-11-23 DIAGNOSIS — H04123 Dry eye syndrome of bilateral lacrimal glands: Secondary | ICD-10-CM | POA: Diagnosis not present

## 2017-12-28 DIAGNOSIS — H02105 Unspecified ectropion of left lower eyelid: Secondary | ICD-10-CM | POA: Diagnosis not present

## 2017-12-28 DIAGNOSIS — H02102 Unspecified ectropion of right lower eyelid: Secondary | ICD-10-CM | POA: Diagnosis not present

## 2017-12-28 DIAGNOSIS — H04123 Dry eye syndrome of bilateral lacrimal glands: Secondary | ICD-10-CM | POA: Diagnosis not present

## 2018-01-22 DIAGNOSIS — I10 Essential (primary) hypertension: Secondary | ICD-10-CM | POA: Diagnosis not present

## 2018-01-22 DIAGNOSIS — R972 Elevated prostate specific antigen [PSA]: Secondary | ICD-10-CM | POA: Diagnosis not present

## 2018-01-22 DIAGNOSIS — Z0000 Encounter for general adult medical examination without abnormal findings: Secondary | ICD-10-CM | POA: Diagnosis not present

## 2018-01-22 DIAGNOSIS — E1169 Type 2 diabetes mellitus with other specified complication: Secondary | ICD-10-CM | POA: Diagnosis not present

## 2018-01-22 DIAGNOSIS — E782 Mixed hyperlipidemia: Secondary | ICD-10-CM | POA: Diagnosis not present

## 2018-01-22 DIAGNOSIS — N401 Enlarged prostate with lower urinary tract symptoms: Secondary | ICD-10-CM | POA: Diagnosis not present

## 2018-01-22 DIAGNOSIS — I6529 Occlusion and stenosis of unspecified carotid artery: Secondary | ICD-10-CM | POA: Diagnosis not present

## 2018-04-23 DIAGNOSIS — E1169 Type 2 diabetes mellitus with other specified complication: Secondary | ICD-10-CM | POA: Diagnosis not present

## 2018-07-27 DIAGNOSIS — I10 Essential (primary) hypertension: Secondary | ICD-10-CM | POA: Diagnosis not present

## 2018-07-27 DIAGNOSIS — E1169 Type 2 diabetes mellitus with other specified complication: Secondary | ICD-10-CM | POA: Diagnosis not present

## 2018-07-30 DIAGNOSIS — R0789 Other chest pain: Secondary | ICD-10-CM | POA: Diagnosis not present

## 2018-07-30 DIAGNOSIS — H6590 Unspecified nonsuppurative otitis media, unspecified ear: Secondary | ICD-10-CM | POA: Diagnosis not present

## 2018-07-30 DIAGNOSIS — R220 Localized swelling, mass and lump, head: Secondary | ICD-10-CM | POA: Diagnosis not present

## 2018-08-02 DIAGNOSIS — B029 Zoster without complications: Secondary | ICD-10-CM | POA: Diagnosis not present

## 2018-08-15 DIAGNOSIS — B029 Zoster without complications: Secondary | ICD-10-CM | POA: Diagnosis not present

## 2018-09-05 DIAGNOSIS — B029 Zoster without complications: Secondary | ICD-10-CM | POA: Diagnosis not present

## 2018-09-05 DIAGNOSIS — I10 Essential (primary) hypertension: Secondary | ICD-10-CM | POA: Diagnosis not present

## 2018-09-05 DIAGNOSIS — E1169 Type 2 diabetes mellitus with other specified complication: Secondary | ICD-10-CM | POA: Diagnosis not present

## 2018-09-05 DIAGNOSIS — N401 Enlarged prostate with lower urinary tract symptoms: Secondary | ICD-10-CM | POA: Diagnosis not present

## 2018-09-05 DIAGNOSIS — I6529 Occlusion and stenosis of unspecified carotid artery: Secondary | ICD-10-CM | POA: Diagnosis not present

## 2018-09-05 DIAGNOSIS — E782 Mixed hyperlipidemia: Secondary | ICD-10-CM | POA: Diagnosis not present

## 2018-09-07 DIAGNOSIS — N401 Enlarged prostate with lower urinary tract symptoms: Secondary | ICD-10-CM | POA: Diagnosis not present

## 2018-09-07 DIAGNOSIS — I6529 Occlusion and stenosis of unspecified carotid artery: Secondary | ICD-10-CM | POA: Diagnosis not present

## 2018-09-07 DIAGNOSIS — E1169 Type 2 diabetes mellitus with other specified complication: Secondary | ICD-10-CM | POA: Diagnosis not present

## 2018-09-07 DIAGNOSIS — I10 Essential (primary) hypertension: Secondary | ICD-10-CM | POA: Diagnosis not present

## 2018-09-07 DIAGNOSIS — E782 Mixed hyperlipidemia: Secondary | ICD-10-CM | POA: Diagnosis not present

## 2018-09-11 ENCOUNTER — Ambulatory Visit: Payer: PPO | Admitting: Cardiovascular Disease

## 2018-09-27 DIAGNOSIS — L308 Other specified dermatitis: Secondary | ICD-10-CM | POA: Diagnosis not present

## 2018-10-12 ENCOUNTER — Other Ambulatory Visit: Payer: Self-pay

## 2018-10-12 DIAGNOSIS — I779 Disorder of arteries and arterioles, unspecified: Secondary | ICD-10-CM

## 2018-10-18 ENCOUNTER — Ambulatory Visit (INDEPENDENT_AMBULATORY_CARE_PROVIDER_SITE_OTHER): Payer: PPO | Admitting: Family

## 2018-10-18 ENCOUNTER — Ambulatory Visit (HOSPITAL_COMMUNITY)
Admission: RE | Admit: 2018-10-18 | Discharge: 2018-10-18 | Disposition: A | Discharge status: Home / Self Care | Payer: PPO | Source: Ambulatory Visit | Attending: Family | Admitting: Family

## 2018-10-18 ENCOUNTER — Other Ambulatory Visit: Payer: Self-pay

## 2018-10-18 ENCOUNTER — Encounter: Payer: Self-pay | Admitting: Family

## 2018-10-18 VITALS — BP 184/84 | HR 63 | Temp 97.2°F | Resp 16 | Ht 66.0 in | Wt 158.0 lb

## 2018-10-18 DIAGNOSIS — I779 Disorder of arteries and arterioles, unspecified: Secondary | ICD-10-CM

## 2018-10-18 DIAGNOSIS — I6522 Occlusion and stenosis of left carotid artery: Secondary | ICD-10-CM | POA: Diagnosis not present

## 2018-10-18 DIAGNOSIS — I739 Peripheral vascular disease, unspecified: Secondary | ICD-10-CM | POA: Diagnosis not present

## 2018-10-18 DIAGNOSIS — Z9889 Other specified postprocedural states: Secondary | ICD-10-CM | POA: Diagnosis not present

## 2018-10-18 NOTE — Progress Notes (Signed)
Virtual Visit via Telephone Note   I was unable to connect with David Sellers on 10/18/2018 using the [employer] by telephone. I called [phone] [phone], voicemail answered.    PCP: Hulan Fess, MD  Chief Complaint: follow up extracranial carotid artery stenosis   History of Present Illness: David Sellers is a 78 y.o. male who is s/p left carotid endarterectomy on 4/1/15by Dr. Oneida Alar.  At his last visit on 07-13-17, the patient denied any history of TIA or stroke symptoms, specificallyhedenied a history of amaurosis fugax or monocular blindness, unilateral facial drooping, hemiplegia, orreceptive or expressive aphasia.   At his June 2019 visit he denied claudication type symptoms with walking, denied non healing wounds.  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 He is quite active physically.  Diabetic: diet controlled Tobacco use: non-smoker  Pt meds include: Statin : yes ASA: Yes Other anticoagulants/antiplatelets: no   Past Medical History:  Diagnosis Date  . Arthritis   . Asthma    only has flareup with smelly perfume   . BPH (benign prostatic hyperplasia)   . Carotid artery occlusion    right ICA 50% stenosis  . Carotodynia   . Cataract    immature to the right eye  . Diabetes mellitus without complication (HCC)    borderline  . GERD (gastroesophageal reflux disease)    takes Omeprazole daily  . Headache(784.0)    r/t arthritis in neck   . Hemorrhoids   . History of bronchitis    last time 10-62yrs ago  . History of colon polyps   . Hyperlipidemia    no meds since Jan 1  . Hypertension    takes Verapamil,Lisinopril,and Cardura daily  . Joint pain   . Legally blind in left eye, as defined in [place]    hit by a baseball  . Low back pain    reason unknown  . Urinary frequency    sees Dr.Tannenbaum    Past Surgical History:  Procedure Laterality Date  . CAROTID ENDARTERECTOMY Left 05/08/13  .  COLONOSCOPY    . EGD with dilitation    . ENDARTERECTOMY Left 05/08/2013   Procedure: LEFT CAROTID ARTERY ENDARTERECTOMY WITH DACRON PATCH ANGIOPLASTY;  Surgeon: Elam Dutch, MD;  Location: [facility];  Service: Vascular;  Laterality: Left;  . EYE SURGERY    . HERNIA REPAIR     x 2  . RETINAL DETACHMENT SURGERY    . retinal laser surgery Left    plate placed    Current Meds  Medication Sig  . acetaminophen (TYLENOL) 650 MG CR tablet Take 1,300 mg by mouth every 8 (eight) hours as needed for pain.  Marland Kitchen aspirin EC 81 MG tablet Take 81 mg by mouth every morning.  Marland Kitchen doxazosin (CARDURA) 8 MG tablet Take 8 mg by mouth every evening.   Marland Kitchen glimepiride (AMARYL) 1 MG tablet Take 1 mg by mouth daily with breakfast.  . KRILL OIL PO Take 1 capsule by mouth 2 (two) times daily.   Marland Kitchen loratadine (CLARITIN) 10 MG tablet Take 10 mg by mouth daily. Reported on 06/29/2015  . losartan (COZAAR) 100 MG tablet Take 100 mg by mouth daily.  Marland Kitchen lovastatin (MEVACOR) 20 MG tablet Take 20 mg by mouth at bedtime.  . metFORMIN (GLUCOPHAGE) 500 MG tablet Take by mouth 2 (two) times daily with a meal.  . omeprazole (PRILOSEC) 20 MG capsule Take 20 mg by mouth every  morning.  . verapamil (CALAN) 120 MG tablet Take 120 mg by mouth once.   . verapamil (COVERA HS) 240 MG (CO) 24 hr tablet Take 240 mg by mouth every morning.      Observations/Objective: DATA Carotid Duplex (10-18-18): Right Carotid: Velocities in the right ICA are consistent with a 1-39% stenosis.                Calcific plaque may obscure higher velocities.                Non-hemodynamically significant plaque <50% noted in the CCA. Left Carotid: Velocities in the left ICA are consistent with a 1-39% stenosis.               Non-hemodynamically significant plaque <50% noted in the CCA. Vertebrals:  Bilateral vertebral arteries demonstrate antegrade flow. Right              vertebral waveform is atypical. Subclavians: Normal flow hemodynamics were seen in  bilateral subclavian arteries. Stable 1-39% stenosis in the right ICA, left ICA increased to 1-39% stenosis compared to the exam on 07-13-17.   Assessment and Plan: David Sellers is a 78 y.o. male who whois s/p left carotid endarterectomy on 05/08/13. He has no history of stroke or TIA as of his last visit in June 2019.  He has never used tobacco and has diet controlled DM.  He takes a daily ASA and a statin.  He stays physically active.    Follow Up Instructions:   Follow up 1 year with carotid duplex.    I discussed the assessment and treatment plan with the patient. The patient was provided an opportunity to ask questions and all were answered. The patient agreed with the plan and demonstrated an understanding of the instructions.   The patient was advised to call back or seek an in-person evaluation if the symptoms worsen or if the condition fails to improve as anticipated.  I spent 0 minutes with the patient via telephone encounter, pt did not answer the phone after 2 attempts at calling him. He was here earlier today for his carotid duplex.    Donnalee CurrySigned, Cleo Villamizar [facility] [place] Office: [phone]  10/18/2018, 4:26 PM

## 2018-10-18 NOTE — Patient Instructions (Signed)

## 2018-11-06 DIAGNOSIS — E782 Mixed hyperlipidemia: Secondary | ICD-10-CM | POA: Diagnosis not present

## 2018-11-06 DIAGNOSIS — E1169 Type 2 diabetes mellitus with other specified complication: Secondary | ICD-10-CM | POA: Diagnosis not present

## 2018-11-06 DIAGNOSIS — E119 Type 2 diabetes mellitus without complications: Secondary | ICD-10-CM | POA: Diagnosis not present

## 2018-11-06 DIAGNOSIS — N401 Enlarged prostate with lower urinary tract symptoms: Secondary | ICD-10-CM | POA: Diagnosis not present

## 2018-11-06 DIAGNOSIS — I10 Essential (primary) hypertension: Secondary | ICD-10-CM | POA: Diagnosis not present

## 2018-11-09 ENCOUNTER — Ambulatory Visit (INDEPENDENT_AMBULATORY_CARE_PROVIDER_SITE_OTHER): Payer: PPO | Admitting: Cardiovascular Disease

## 2018-11-09 ENCOUNTER — Encounter: Payer: Self-pay | Admitting: Cardiovascular Disease

## 2018-11-09 ENCOUNTER — Other Ambulatory Visit: Payer: Self-pay

## 2018-11-09 VITALS — BP 204/88 | HR 60 | Temp 98.1°F | Ht 66.0 in | Wt 159.4 lb

## 2018-11-09 DIAGNOSIS — I10 Essential (primary) hypertension: Secondary | ICD-10-CM | POA: Diagnosis not present

## 2018-11-09 DIAGNOSIS — Z008 Encounter for other general examination: Secondary | ICD-10-CM

## 2018-11-09 DIAGNOSIS — E782 Mixed hyperlipidemia: Secondary | ICD-10-CM | POA: Diagnosis not present

## 2018-11-09 NOTE — Patient Instructions (Addendum)

## 2018-11-09 NOTE — Assessment & Plan Note (Signed)
History of essential hypertension with blood pressure measured today of 204/88.  He does measure his blood pressure at home daily and this morning was 126/73.  He is on doxazosin, losartan, verapamil and carvedilol.

## 2018-11-09 NOTE — Assessment & Plan Note (Signed)
History of carotid artery disease status post elective uncomplicated left carotid endarterectomy performed by Dr. Oneida Alar 05/08/2013 with an excellent result although patient was left with some residual left facial numbness.  His Dopplers are followed by Dr.Fields

## 2018-11-09 NOTE — Assessment & Plan Note (Signed)
History of hyperlipidemia on statin therapy with lipid profile performed 07/27/2018 revealing a total cholesterol of 158, LDL of 84 and HDL 42.

## 2018-11-09 NOTE — Progress Notes (Signed)
11/09/2018 David Sellers   [birth year]  [id number]  Primary Physician Catha GosselinLittle, Kevin, MD Primary Cardiologist: Runell GessJonathan J Dorise Gangi MD Nicholes CalamityFACP, FACC, FAHA, [place]  HPI:  David Sellers is a 78 y.o.   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 Fabienne Brunsharles Fields for cardiovascular evaluation and preoperative screening prior to elective left carotid endarterectomy. I last saw him in the office  07/11/2017. His cardiovascular factor profile is positive for hypertension, dyslipidemia, and family history of heart disease with a father who died of a myocardial infarction in his 5860s. He has never had a heart attack or stroke. He denies chest pain or shortness of breath. He did have pain in his left neck and had incidentally found moderate left internal carotid artery stenosis. A Myoview stress test performed for preoperative clearance 04/23/13 was entirely normal. He underwent uncomplicated left carotid endarterectomy by Dr. Darrick PennaFields on 05/08/13 with excellent operative result although the patient does have some left facial numbness as result. SinceI saw him a year ago he has remained stable. He is been under a lot of stress lately with his wife having kidney infection him having upper respiratory tract infection although he checks his blood pressure at home and it runs in the 135/70 range.    Since I saw him in the office a year ago he is remained stable.  He has recently been diagnosed with diabetes.  He did have an episode of the shingles and has some postherpetic neuralgia.  He denies chest pain or shortness of breath.  Current Meds  Medication Sig  . acetaminophen (TYLENOL) 650 MG CR tablet Take 1,300 mg by mouth every 8 (eight) hours as needed for pain.  Marland Kitchen. aspirin EC 81 MG tablet Take 81 mg by mouth every morning.  Marland Kitchen. doxazosin (CARDURA) 8 MG tablet Take 8 mg by mouth every evening. Takes 1/2 tablet  . glimepiride (AMARYL) 1 MG tablet Take 1 mg by mouth  daily with breakfast.  . KRILL OIL PO Take 1 capsule by mouth 2 (two) times daily.   Marland Kitchen. loratadine (CLARITIN) 10 MG tablet Take 10 mg by mouth daily. Reported on 06/29/2015  . losartan (COZAAR) 100 MG tablet Take 100 mg by mouth daily.  Marland Kitchen. lovastatin (MEVACOR) 20 MG tablet Take 20 mg by mouth at bedtime.  . metFORMIN (GLUCOPHAGE) 500 MG tablet Take 500 mg by mouth daily.   Marland Kitchen. omeprazole (PRILOSEC) 20 MG capsule Take 20 mg by mouth every morning.  . verapamil (CALAN) 120 MG tablet Take 120 mg by mouth once.   . verapamil (COVERA HS) 240 MG (CO) 24 hr tablet Take 240 mg by mouth every morning.     Allergies  Allergen Reactions  . Meloxicam Itching, Palpitations and Other (See Comments)    Headaches, Mood swings.  . Morphine And Related Nausea And Vomiting  . Statins Other (See Comments)    Severe arthritic response  . Cholestoff [Plant Sterols And Stanols] Other (See Comments)    Muscle aches  . Nsaids Other (See Comments)    Aleve ,  Myalgia  . Atorvastatin     Severe myalgias  . Darvon [Propoxyphene] Other (See Comments)    hallucinate  . Indomethacin     Neck pain flare  . Penicillins     *positive allergy test*  . Pravastatin     myalgias  . Sulfa Antibiotics Nausea And Vomiting  . Tramadol     Social  History   Socioeconomic History  . Marital status: Married    Spouse name: Not on file  . Number of children: Not on file  . Years of education: Not on file  . Highest education level: Not on file  Occupational History  . Not on file  Social Needs  . Financial resource strain: Not on file  . Food insecurity    Worry: Not on file    Inability: Not on file  . Transportation needs    Medical: Not on file    Non-medical: Not on file  Tobacco Use  . Smoking status: Never Smoker  . Smokeless tobacco: Never Used  Substance and Sexual Activity  . Alcohol use: No  . Drug use: No  . Sexual activity: Yes  Lifestyle  . Physical activity    Days per week: Not on file     Minutes per session: Not on file  . Stress: Not on file  Relationships  . Social Musician on phone: Not on file    Gets together: Not on file    Attends religious service: Not on file    Active member of club or organization: Not on file    Attends meetings of clubs or organizations: Not on file    Relationship status: Not on file  . Intimate partner violence    Fear of current or ex partner: Not on file    Emotionally abused: Not on file    Physically abused: Not on file    Forced sexual activity: Not on file  Other Topics Concern  . Not on file  Social History Narrative  . Not on file     Review of Systems: General: negative for chills, fever, night sweats or weight changes.  Cardiovascular: negative for chest pain, dyspnea on exertion, edema, orthopnea, palpitations, paroxysmal nocturnal dyspnea or shortness of breath Dermatological: negative for rash Respiratory: negative for cough or wheezing Urologic: negative for hematuria Abdominal: negative for nausea, vomiting, diarrhea, bright red blood per rectum, melena, or hematemesis Neurologic: negative for visual changes, syncope, or dizziness All other systems reviewed and are otherwise negative except as noted above.    Blood pressure (!) 204/88, pulse 60, temperature 98.1 F (36.7 C), height 5\' 6"  (1.676 m), weight 159 lb 6.4 oz (72.3 kg), SpO2 98 %.  General appearance: alert and no distress Neck: no adenopathy, no JVD, supple, symmetrical, trachea midline, thyroid not enlarged, symmetric, no tenderness/mass/nodules and Soft right carotid bruit Lungs: clear to auscultation bilaterally Heart: regular rate and rhythm, S1, S2 normal, no murmur, click, rub or gallop Extremities: extremities normal, atraumatic, no cyanosis or edema Pulses: 2+ and symmetric Skin: Skin color, texture, turgor normal. No rashes or lesions Neurologic: Alert and oriented X 3, normal strength and tone. Normal symmetric reflexes. Normal  coordination and gait  EKG sinus bradycardia at 56 with nonspecific ST and T wave changes.  I personally reviewed this EKG.  ASSESSMENT AND PLAN:   Occlusion and stenosis of carotid artery without mention of cerebral infarction History of carotid artery disease status post elective uncomplicated left carotid endarterectomy performed by Dr. 05/08/2013 with an excellent result although patient was left with some residual left facial numbness.  His Dopplers are followed by Dr.Fields  Essential hypertension History of essential hypertension with blood pressure measured today of 204/88.  He does measure his blood pressure at home daily and this morning was 126/73.  He is on doxazosin, losartan, verapamil and carvedilol.  Hyperlipidemia  History of hyperlipidemia on statin therapy with lipid profile performed 07/27/2018 revealing a total cholesterol of 158, LDL of 84 and HDL 42.      Lorretta Harp MD FACP,FACC,FAHA, [facility] 11/09/2018 3:59 PM

## 2018-12-26 DIAGNOSIS — E782 Mixed hyperlipidemia: Secondary | ICD-10-CM | POA: Diagnosis not present

## 2018-12-26 DIAGNOSIS — N401 Enlarged prostate with lower urinary tract symptoms: Secondary | ICD-10-CM | POA: Diagnosis not present

## 2018-12-26 DIAGNOSIS — E1169 Type 2 diabetes mellitus with other specified complication: Secondary | ICD-10-CM | POA: Diagnosis not present

## 2018-12-26 DIAGNOSIS — Z7984 Long term (current) use of oral hypoglycemic drugs: Secondary | ICD-10-CM | POA: Diagnosis not present

## 2018-12-26 DIAGNOSIS — I10 Essential (primary) hypertension: Secondary | ICD-10-CM | POA: Diagnosis not present

## 2018-12-31 DIAGNOSIS — M25561 Pain in right knee: Secondary | ICD-10-CM | POA: Diagnosis not present

## 2018-12-31 DIAGNOSIS — M25562 Pain in left knee: Secondary | ICD-10-CM | POA: Diagnosis not present

## 2019-02-06 DIAGNOSIS — Z7984 Long term (current) use of oral hypoglycemic drugs: Secondary | ICD-10-CM | POA: Diagnosis not present

## 2019-02-06 DIAGNOSIS — E119 Type 2 diabetes mellitus without complications: Secondary | ICD-10-CM | POA: Diagnosis not present

## 2019-02-06 DIAGNOSIS — E782 Mixed hyperlipidemia: Secondary | ICD-10-CM | POA: Diagnosis not present

## 2019-02-06 DIAGNOSIS — N401 Enlarged prostate with lower urinary tract symptoms: Secondary | ICD-10-CM | POA: Diagnosis not present

## 2019-02-06 DIAGNOSIS — I10 Essential (primary) hypertension: Secondary | ICD-10-CM | POA: Diagnosis not present

## 2019-02-19 DIAGNOSIS — E119 Type 2 diabetes mellitus without complications: Secondary | ICD-10-CM | POA: Diagnosis not present

## 2019-02-19 DIAGNOSIS — Z7984 Long term (current) use of oral hypoglycemic drugs: Secondary | ICD-10-CM | POA: Diagnosis not present

## 2019-02-19 DIAGNOSIS — E782 Mixed hyperlipidemia: Secondary | ICD-10-CM | POA: Diagnosis not present

## 2019-02-19 DIAGNOSIS — I10 Essential (primary) hypertension: Secondary | ICD-10-CM | POA: Diagnosis not present

## 2019-02-19 DIAGNOSIS — N401 Enlarged prostate with lower urinary tract symptoms: Secondary | ICD-10-CM | POA: Diagnosis not present

## 2019-02-19 DIAGNOSIS — E1169 Type 2 diabetes mellitus with other specified complication: Secondary | ICD-10-CM | POA: Diagnosis not present

## 2019-03-04 DIAGNOSIS — E782 Mixed hyperlipidemia: Secondary | ICD-10-CM | POA: Diagnosis not present

## 2019-03-04 DIAGNOSIS — E1169 Type 2 diabetes mellitus with other specified complication: Secondary | ICD-10-CM | POA: Diagnosis not present

## 2019-03-04 DIAGNOSIS — I6529 Occlusion and stenosis of unspecified carotid artery: Secondary | ICD-10-CM | POA: Diagnosis not present

## 2019-03-04 DIAGNOSIS — Z7984 Long term (current) use of oral hypoglycemic drugs: Secondary | ICD-10-CM | POA: Diagnosis not present

## 2019-03-04 DIAGNOSIS — I10 Essential (primary) hypertension: Secondary | ICD-10-CM | POA: Diagnosis not present

## 2019-03-04 DIAGNOSIS — N401 Enlarged prostate with lower urinary tract symptoms: Secondary | ICD-10-CM | POA: Diagnosis not present

## 2019-03-06 DIAGNOSIS — E782 Mixed hyperlipidemia: Secondary | ICD-10-CM | POA: Diagnosis not present

## 2019-03-06 DIAGNOSIS — I10 Essential (primary) hypertension: Secondary | ICD-10-CM | POA: Diagnosis not present

## 2019-03-06 DIAGNOSIS — I6529 Occlusion and stenosis of unspecified carotid artery: Secondary | ICD-10-CM | POA: Diagnosis not present

## 2019-03-06 DIAGNOSIS — E1169 Type 2 diabetes mellitus with other specified complication: Secondary | ICD-10-CM | POA: Diagnosis not present

## 2019-03-21 DIAGNOSIS — H04123 Dry eye syndrome of bilateral lacrimal glands: Secondary | ICD-10-CM | POA: Diagnosis not present

## 2019-03-21 DIAGNOSIS — H1789 Other corneal scars and opacities: Secondary | ICD-10-CM | POA: Diagnosis not present

## 2019-03-21 DIAGNOSIS — E119 Type 2 diabetes mellitus without complications: Secondary | ICD-10-CM | POA: Diagnosis not present

## 2019-04-09 DIAGNOSIS — Z1152 Encounter for screening for COVID-19: Secondary | ICD-10-CM | POA: Diagnosis not present

## 2019-04-09 DIAGNOSIS — R062 Wheezing: Secondary | ICD-10-CM | POA: Diagnosis not present

## 2019-04-11 DIAGNOSIS — J069 Acute upper respiratory infection, unspecified: Secondary | ICD-10-CM | POA: Diagnosis not present

## 2019-04-16 DIAGNOSIS — J45901 Unspecified asthma with (acute) exacerbation: Secondary | ICD-10-CM | POA: Diagnosis not present

## 2019-04-22 DIAGNOSIS — E782 Mixed hyperlipidemia: Secondary | ICD-10-CM | POA: Diagnosis not present

## 2019-04-22 DIAGNOSIS — E1169 Type 2 diabetes mellitus with other specified complication: Secondary | ICD-10-CM | POA: Diagnosis not present

## 2019-04-22 DIAGNOSIS — E119 Type 2 diabetes mellitus without complications: Secondary | ICD-10-CM | POA: Diagnosis not present

## 2019-04-22 DIAGNOSIS — N401 Enlarged prostate with lower urinary tract symptoms: Secondary | ICD-10-CM | POA: Diagnosis not present

## 2019-04-22 DIAGNOSIS — Z7984 Long term (current) use of oral hypoglycemic drugs: Secondary | ICD-10-CM | POA: Diagnosis not present

## 2019-04-22 DIAGNOSIS — I10 Essential (primary) hypertension: Secondary | ICD-10-CM | POA: Diagnosis not present

## 2019-06-17 DIAGNOSIS — I10 Essential (primary) hypertension: Secondary | ICD-10-CM | POA: Diagnosis not present

## 2019-06-17 DIAGNOSIS — E119 Type 2 diabetes mellitus without complications: Secondary | ICD-10-CM | POA: Diagnosis not present

## 2019-06-17 DIAGNOSIS — E1169 Type 2 diabetes mellitus with other specified complication: Secondary | ICD-10-CM | POA: Diagnosis not present

## 2019-06-17 DIAGNOSIS — N401 Enlarged prostate with lower urinary tract symptoms: Secondary | ICD-10-CM | POA: Diagnosis not present

## 2019-06-17 DIAGNOSIS — E782 Mixed hyperlipidemia: Secondary | ICD-10-CM | POA: Diagnosis not present

## 2019-06-24 ENCOUNTER — Telehealth: Payer: Self-pay | Admitting: Cardiovascular Disease

## 2019-06-24 NOTE — Telephone Encounter (Signed)
If he has not had a recent procedure he should probably see his PCP.

## 2019-06-24 NOTE — Telephone Encounter (Signed)
The patient has been made aware and verbalized his understanding. He will call his PCP to be seen sooner.

## 2019-06-24 NOTE — Telephone Encounter (Signed)
°  Pt c/o swelling: STAT is pt has developed SOB within 24 hours  1) How much weight have you gained and in what time span? Has lost weight, 8 or 10 lbs in 6 months  2) If swelling, where is the swelling located? Groin, tighntess in legs  3) Are you currently taking a fluid pill? no  4) Are you currently SOB? no  5) Do you have a log of your daily weights (if so, list)? no  6) Have you gained 3 pounds in a day or 5 pounds in a week? no  7) Have you traveled recently? February [place]   Patient states his groin is swollen and purple. He states his wife went to the urologist and asked them about it, and states they told her he may need to see a urologist or it may be heart related. He states he also has a tightness in his legs and gets a pain in his jaw after walking up hill. He states he had an x ray of his legs and they told him they looked fine. He also states his BP has been averaging 123/70.

## 2019-06-24 NOTE — Telephone Encounter (Signed)
Returned the call to the patient. He stated that for a week and a half he has been having groin swelling with a dark purple tint. He denies any pain and swelling anywhere else. He has not had any recent procedures that would have caused this either.   His wife went to a urologist and spoke about his situation. They have recommended that he see a urologist about the problem but also to be in touch with his cardiologist.   He has an appointment with a urologist in a month and a half.  He has been advised to check with his PCP but he said he is in the middle of finding a new one.

## 2019-07-18 DIAGNOSIS — E1169 Type 2 diabetes mellitus with other specified complication: Secondary | ICD-10-CM | POA: Diagnosis not present

## 2019-07-18 DIAGNOSIS — E119 Type 2 diabetes mellitus without complications: Secondary | ICD-10-CM | POA: Diagnosis not present

## 2019-07-18 DIAGNOSIS — I10 Essential (primary) hypertension: Secondary | ICD-10-CM | POA: Diagnosis not present

## 2019-07-18 DIAGNOSIS — E782 Mixed hyperlipidemia: Secondary | ICD-10-CM | POA: Diagnosis not present

## 2019-07-18 DIAGNOSIS — N401 Enlarged prostate with lower urinary tract symptoms: Secondary | ICD-10-CM | POA: Diagnosis not present

## 2019-07-19 DIAGNOSIS — M545 Low back pain: Secondary | ICD-10-CM | POA: Diagnosis not present

## 2019-07-25 DIAGNOSIS — M25561 Pain in right knee: Secondary | ICD-10-CM | POA: Diagnosis not present

## 2019-07-25 DIAGNOSIS — M25562 Pain in left knee: Secondary | ICD-10-CM | POA: Diagnosis not present

## 2019-07-26 DIAGNOSIS — B351 Tinea unguium: Secondary | ICD-10-CM | POA: Diagnosis not present

## 2019-07-26 DIAGNOSIS — N433 Hydrocele, unspecified: Secondary | ICD-10-CM | POA: Diagnosis not present

## 2019-07-30 DIAGNOSIS — M25561 Pain in right knee: Secondary | ICD-10-CM | POA: Diagnosis not present

## 2019-07-30 DIAGNOSIS — M25562 Pain in left knee: Secondary | ICD-10-CM | POA: Diagnosis not present

## 2019-08-01 DIAGNOSIS — M25561 Pain in right knee: Secondary | ICD-10-CM | POA: Diagnosis not present

## 2019-08-01 DIAGNOSIS — M25562 Pain in left knee: Secondary | ICD-10-CM | POA: Diagnosis not present

## 2019-08-02 ENCOUNTER — Ambulatory Visit: Payer: Self-pay | Admitting: Surgery

## 2019-08-02 DIAGNOSIS — K643 Fourth degree hemorrhoids: Secondary | ICD-10-CM | POA: Diagnosis not present

## 2019-08-06 DIAGNOSIS — M25561 Pain in right knee: Secondary | ICD-10-CM | POA: Diagnosis not present

## 2019-08-06 DIAGNOSIS — M25562 Pain in left knee: Secondary | ICD-10-CM | POA: Diagnosis not present

## 2019-08-07 ENCOUNTER — Encounter (HOSPITAL_BASED_OUTPATIENT_CLINIC_OR_DEPARTMENT_OTHER): Payer: Self-pay | Admitting: Surgery

## 2019-08-07 ENCOUNTER — Other Ambulatory Visit: Payer: Self-pay

## 2019-08-08 DIAGNOSIS — I10 Essential (primary) hypertension: Secondary | ICD-10-CM | POA: Diagnosis not present

## 2019-08-08 DIAGNOSIS — R062 Wheezing: Secondary | ICD-10-CM | POA: Diagnosis not present

## 2019-08-08 DIAGNOSIS — Z7984 Long term (current) use of oral hypoglycemic drugs: Secondary | ICD-10-CM | POA: Diagnosis not present

## 2019-08-08 DIAGNOSIS — E119 Type 2 diabetes mellitus without complications: Secondary | ICD-10-CM | POA: Diagnosis not present

## 2019-08-09 ENCOUNTER — Encounter (HOSPITAL_BASED_OUTPATIENT_CLINIC_OR_DEPARTMENT_OTHER)
Admission: RE | Admit: 2019-08-09 | Discharge: 2019-08-09 | Disposition: A | Discharge status: Home / Self Care | Payer: PPO | Source: Ambulatory Visit | Attending: Surgery | Admitting: Surgery

## 2019-08-09 ENCOUNTER — Other Ambulatory Visit (HOSPITAL_COMMUNITY)
Admission: RE | Admit: 2019-08-09 | Discharge: 2019-08-09 | Disposition: A | Discharge status: Home / Self Care | Payer: PPO | Source: Ambulatory Visit | Attending: Surgery | Admitting: Surgery

## 2019-08-09 ENCOUNTER — Other Ambulatory Visit (HOSPITAL_COMMUNITY): Payer: PPO

## 2019-08-09 DIAGNOSIS — Z20822 Contact with and (suspected) exposure to covid-19: Secondary | ICD-10-CM | POA: Insufficient documentation

## 2019-08-09 DIAGNOSIS — N433 Hydrocele, unspecified: Secondary | ICD-10-CM | POA: Diagnosis not present

## 2019-08-09 DIAGNOSIS — Z01812 Encounter for preprocedural laboratory examination: Secondary | ICD-10-CM | POA: Diagnosis not present

## 2019-08-09 LAB — BASIC METABOLIC PANEL
Anion gap: 6 (ref 5–15)
BUN: 10 mg/dL (ref 8–23)
CO2: 26 mmol/L (ref 22–32)
Calcium: 9.1 mg/dL (ref 8.9–10.3)
Chloride: 107 mmol/L (ref 98–111)
Creatinine, Ser: 0.89 mg/dL (ref 0.61–1.24)
GFR calc Af Amer: >60 mL/min (ref >60)
GFR calc non Af Amer: >60 mL/min (ref >60)
Glucose, Bld: 151 mg/dL — ABNORMAL HIGH (ref 70–99)
Potassium: 3.9 mmol/L (ref 3.5–5.1)
Sodium: 139 mmol/L (ref 135–145)

## 2019-08-09 LAB — SARS CORONAVIRUS 2 (TAT 6-24 HRS): SARS Coronavirus 2: NEGATIVE

## 2019-08-09 NOTE — Progress Notes (Signed)

## 2019-08-12 ENCOUNTER — Encounter (HOSPITAL_BASED_OUTPATIENT_CLINIC_OR_DEPARTMENT_OTHER): Payer: Self-pay | Admitting: Surgery

## 2019-08-12 DIAGNOSIS — K643 Fourth degree hemorrhoids: Secondary | ICD-10-CM | POA: Diagnosis present

## 2019-08-12 NOTE — H&P (Signed)
General Surgery [facility]  David Sellers DOB: [birth year] Married / Language: English / Race: White Male   History of Present Illness  The patient is a 79 year old male who presents with hemorrhoids.  CHIEF COMPLAINT: prolapsed internal hemorrhoids  Patient is referred by Dr. Catha Gosselin for surgical evaluation and management of chronic hemorrhoids. Patient states that he has had problems with hemorrhoids dating back 50 years. He has had rare occasions with bleeding. He has noted prolapse. He has noted some mild leakage with physical activity. He has not had any prior anorectal surgery. He presents today for evaluation and recommendations for management.   Problem List/Past Medical  LEFT INGUINAL PAIN (R10.32)  INGUINAL HERNIA OF RIGHT SIDE WITHOUT OBSTRUCTION OR GANGRENE (K40.90)  PROLAPSED INTERNAL HEMORRHOIDS, GRADE 4 (K64.3)   Past Surgical History Hemorrhoidectomy  Carotid Artery Surgery  Left. Ventral / Umbilical Hernia Surgery  Left. multiple  Diagnostic Studies History  Colonoscopy  1-5 years ago  Allergies  Indomethacin *ANALGESICS - ANTI-INFLAMMATORY*  Darvon *ANALGESICS - OPIOID*  Penicillins  Sulfa Antibiotics  Pravastatin Sodium *ANTIHYPERLIPIDEMICS*  Atorvastatin Calcium *ANTIHYPERLIPIDEMICS*  Morphine Sulfate (Concentrate) *ANALGESICS - OPIOID*  Meloxicam *ANALGESICS - ANTI-INFLAMMATORY*  Statins Depletion *DIETARY PRODUCTS/DIETARY MANAGEMENT PRODUCTS*  NSAIDs  CholestOff *ALTERNATIVE MEDICINES*  Allergies Reconciled   Medication History  Losartan Potassium (100MG  Tablet, Oral) Active. metFORMIN HCl (500MG  Tablet, Oral) Active. Glimepiride (1MG  Tablet, Oral) Active. Carvedilol (3.125MG  Tablet, Oral) Active. Doxazosin Mesylate (8MG  Tablet, 1/2 tablet Oral two times daily) Active. Verapamil HCl ER (240MG  Tablet ER, Oral two times daily) Active. ZyrTEC Allergy (10MG  Tablet, Oral) Active. Krill Oil  (Oral) Active. Medications Reconciled Aspirin (81MG  Tablet, Oral) Active. Lisinopril (30MG  Tablet, Oral) Active. Lovastatin (20MG  Tablet, Oral) Active. Omeprazole (20MG  Tablet DR, Oral) Active. Verapamil HCl (120MG  Tablet, Oral) Active.  Social History  Caffeine use  Carbonated beverages, Coffee, Tea. No alcohol use  No drug use  Tobacco use  Former smoker.  Family History  Alcohol Abuse  Father. Diabetes Mellitus  Daughter. Hypertension  Daughter, Father, Sister. Heart Disease  Father.  Other Problems  Arthritis  Asthma  Diabetes Mellitus  Gastroesophageal Reflux Disease  Hemorrhoids  High blood pressure  Hypercholesterolemia  Ventral Hernia Repair   Review of Systems  General Not Present- Appetite Loss, Chills, Fatigue, Fever, Night Sweats, Weight Gain and Weight Loss. Skin Not Present- Change in Wart/Mole, Dryness, Hives, Jaundice, New Lesions, Non-Healing Wounds, Rash and Ulcer. HEENT Present- Hearing Loss and Wears glasses/contact lenses. Not Present- Earache, Hoarseness, Nose Bleed, Oral Ulcers, Ringing in the Ears, Seasonal Allergies, Sinus Pain, Sore Throat, Visual Disturbances and Yellow Eyes. Respiratory Not Present- Bloody sputum, Chronic Cough, Difficulty Breathing, Snoring and Wheezing. Breast Not Present- Breast Mass, Breast Pain, Nipple Discharge and Skin Changes. Cardiovascular Not Present- Chest Pain, Difficulty Breathing Lying Down, Leg Cramps, Palpitations, Rapid Heart Rate, Shortness of Breath and Swelling of Extremities. Gastrointestinal Present- Constipation and Hemorrhoids. Not Present- Abdominal Pain, Bloating, Bloody Stool, Change in Bowel Habits, Chronic diarrhea, Difficulty Swallowing, Excessive gas, Gets full quickly at meals, Indigestion, Nausea, Rectal Pain and Vomiting. Male Genitourinary Not Present- Blood in Urine, Change in Urinary Stream, Frequency, Impotence, Nocturia, Painful Urination, Urgency and Urine  Leakage.  Vitals Weight: 156 lb Height: 66in Body Surface Area: 1.8 m Body Mass Index: 25.18 kg/m  Temp.: 98.56F (Thermal Scan)  Pulse: 74 (Regular)  BP: 132/76(Sitting, Left Arm, Standard)  Physical Exam  GENERAL APPEARANCE Development: normal Nutritional status: normal Gross deformities: none  SKIN Rash,  lesions, ulcers: none Induration, erythema: none Nodules: none palpable  EYES Conjunctiva and lids: Opacified on left Pupils: equal and reactive on the right Iris: normal on the right  EARS, NOSE, MOUTH, THROAT External ears: no lesion or deformity External nose: no lesion or deformity Hearing: grossly normal Due to Covid-19 pandemic, patient is wearing a mask.  NECK Symmetric: yes Trachea: midline Thyroid: no palpable nodules in the thyroid bed  CHEST Respiratory effort: normal Retraction or accessory muscle use: no Breath sounds: normal bilaterally Rales, rhonchi, wheeze: none  CARDIOVASCULAR Auscultation: regular rhythm, normal rate Murmurs: none Pulses: radial pulse 2+ palpable Lower extremity edema: none  GENITOURINARY Penis: no lesions Scrotum: no masses  RECTAL External examination shows prominent anal skin tags. There is obvious prolapse of internal hemorrhoidal tissue on the left side. This is reducible with digital examination but spontaneously prolapses. Digital rectal examination shows edema along the left side of the anal canal. No palpable masses.  Anoscopy is performed. There is normal lower rectal mucosa. There are grade 2 internal hemorrhoids circumferentially. There are grade 4 hemorrhoids in the left lateral column. There is no sign of ulceration or recent hemorrhage.  MUSCULOSKELETAL Station and gait: normal Digits and nails: no clubbing or cyanosis Muscle strength: grossly normal all extremities Range of motion: grossly normal all extremities Deformity: none  LYMPHATIC Cervical: none palpable Supraclavicular: none  palpable  PSYCHIATRIC Oriented to person, place, and time: yes Mood and affect: normal for situation Judgment and insight: appropriate for situation    Assessment & Plan   PROLAPSED INTERNAL HEMORRHOIDS, GRADE 4 (K64.3) ANOSCOPY, DIAGNOSTIC (21224)  Patient is referred by his primary care physician for evaluation of symptomatic hemorrhoids. Patient is provided with written literature on hemorrhoid surgery to review at home.  On examination, the patient has chronic prolapsed internal hemorrhoids in the left lateral column. He is not a candidate for office-based procedures. This will require a traditional open hemorrhoidectomy as an outpatient surgical procedure. We discussed the surgery. We discussed the need to use stool softeners following the procedure. Patient will plan to stop his physical therapy for approximately 2 weeks after hemorrhoid surgery.  The risks and benefits of the procedure have been discussed at length with the patient. The patient understands the proposed procedure, potential alternative treatments, and the course of recovery to be expected. All of the patient's questions have been answered at this time. The patient wishes to proceed with surgery.  Darnell Level, MD [facility] Office: [phone]

## 2019-08-13 ENCOUNTER — Ambulatory Visit (HOSPITAL_BASED_OUTPATIENT_CLINIC_OR_DEPARTMENT_OTHER): Payer: PPO | Admitting: Certified Registered"

## 2019-08-13 ENCOUNTER — Encounter (HOSPITAL_BASED_OUTPATIENT_CLINIC_OR_DEPARTMENT_OTHER)
Admission: RE | Disposition: A | Discharge status: Home / Self Care | Payer: Self-pay | Source: Home / Self Care | Attending: Surgery

## 2019-08-13 ENCOUNTER — Ambulatory Visit (HOSPITAL_BASED_OUTPATIENT_CLINIC_OR_DEPARTMENT_OTHER)
Admission: RE | Admit: 2019-08-13 | Discharge: 2019-08-13 | Disposition: A | Discharge status: Home / Self Care | Payer: PPO | Attending: Surgery | Admitting: Surgery

## 2019-08-13 ENCOUNTER — Encounter (HOSPITAL_BASED_OUTPATIENT_CLINIC_OR_DEPARTMENT_OTHER): Payer: Self-pay | Admitting: Surgery

## 2019-08-13 ENCOUNTER — Other Ambulatory Visit: Payer: Self-pay

## 2019-08-13 DIAGNOSIS — K219 Gastro-esophageal reflux disease without esophagitis: Secondary | ICD-10-CM | POA: Insufficient documentation

## 2019-08-13 DIAGNOSIS — K643 Fourth degree hemorrhoids: Secondary | ICD-10-CM | POA: Diagnosis present

## 2019-08-13 DIAGNOSIS — Z8249 Family history of ischemic heart disease and other diseases of the circulatory system: Secondary | ICD-10-CM | POA: Insufficient documentation

## 2019-08-13 DIAGNOSIS — M1990 Unspecified osteoarthritis, unspecified site: Secondary | ICD-10-CM | POA: Diagnosis not present

## 2019-08-13 DIAGNOSIS — E7800 Pure hypercholesterolemia, unspecified: Secondary | ICD-10-CM | POA: Insufficient documentation

## 2019-08-13 DIAGNOSIS — R519 Headache, unspecified: Secondary | ICD-10-CM | POA: Insufficient documentation

## 2019-08-13 DIAGNOSIS — Z7984 Long term (current) use of oral hypoglycemic drugs: Secondary | ICD-10-CM | POA: Insufficient documentation

## 2019-08-13 DIAGNOSIS — Z888 Allergy status to other drugs, medicaments and biological substances status: Secondary | ICD-10-CM | POA: Insufficient documentation

## 2019-08-13 DIAGNOSIS — Z882 Allergy status to sulfonamides status: Secondary | ICD-10-CM | POA: Diagnosis not present

## 2019-08-13 DIAGNOSIS — Z885 Allergy status to narcotic agent status: Secondary | ICD-10-CM | POA: Insufficient documentation

## 2019-08-13 DIAGNOSIS — Z7982 Long term (current) use of aspirin: Secondary | ICD-10-CM | POA: Insufficient documentation

## 2019-08-13 DIAGNOSIS — Z880 Allergy status to penicillin: Secondary | ICD-10-CM | POA: Insufficient documentation

## 2019-08-13 DIAGNOSIS — Z833 Family history of diabetes mellitus: Secondary | ICD-10-CM | POA: Diagnosis not present

## 2019-08-13 DIAGNOSIS — Z811 Family history of alcohol abuse and dependence: Secondary | ICD-10-CM | POA: Diagnosis not present

## 2019-08-13 DIAGNOSIS — J45909 Unspecified asthma, uncomplicated: Secondary | ICD-10-CM | POA: Insufficient documentation

## 2019-08-13 DIAGNOSIS — Z79899 Other long term (current) drug therapy: Secondary | ICD-10-CM | POA: Insufficient documentation

## 2019-08-13 DIAGNOSIS — E119 Type 2 diabetes mellitus without complications: Secondary | ICD-10-CM | POA: Insufficient documentation

## 2019-08-13 DIAGNOSIS — I10 Essential (primary) hypertension: Secondary | ICD-10-CM | POA: Insufficient documentation

## 2019-08-13 DIAGNOSIS — Z87891 Personal history of nicotine dependence: Secondary | ICD-10-CM | POA: Diagnosis not present

## 2019-08-13 DIAGNOSIS — Z886 Allergy status to analgesic agent status: Secondary | ICD-10-CM | POA: Diagnosis not present

## 2019-08-13 HISTORY — PX: HEMORRHOID SURGERY: SHX153

## 2019-08-13 HISTORY — DX: Other hemorrhoids: K64.8

## 2019-08-13 LAB — GLUCOSE, CAPILLARY
Glucose-Capillary: 173 mg/dL — ABNORMAL HIGH (ref 70–99)
Glucose-Capillary: 176 mg/dL — ABNORMAL HIGH (ref 70–99)

## 2019-08-13 SURGERY — HEMORRHOIDECTOMY
Anesthesia: General | Site: Rectum

## 2019-08-13 MED ORDER — METRONIDAZOLE IN NACL 5-0.79 MG/ML-% IV SOLN
500.0000 mg | INTRAVENOUS | Status: AC
  Administered 2019-08-13: 500 mg via INTRAVENOUS

## 2019-08-13 MED ORDER — CIPROFLOXACIN IN D5W 400 MG/200ML IV SOLN
400.0000 mg | INTRAVENOUS | Status: AC
  Administered 2019-08-13: 400 mg via INTRAVENOUS

## 2019-08-13 MED ORDER — PROPOFOL 10 MG/ML IV BOLUS
INTRAVENOUS | Status: AC
  Filled 2019-08-13: qty 20

## 2019-08-13 MED ORDER — CIPROFLOXACIN IN D5W 400 MG/200ML IV SOLN
INTRAVENOUS | Status: AC
  Filled 2019-08-13: qty 200

## 2019-08-13 MED ORDER — ROCURONIUM BROMIDE 100 MG/10ML IV SOLN
INTRAVENOUS | Status: DC | PRN
Start: 2019-08-13 — End: 2019-08-13
  Administered 2019-08-13: 50 mg via INTRAVENOUS

## 2019-08-13 MED ORDER — DEXAMETHASONE SODIUM PHOSPHATE 10 MG/ML IJ SOLN
INTRAMUSCULAR | Status: DC | PRN
  Administered 2019-08-13: 4 mg via INTRAVENOUS

## 2019-08-13 MED ORDER — BUPIVACAINE HCL (PF) 0.25 % IJ SOLN
INTRAMUSCULAR | Status: AC
  Filled 2019-08-13: qty 30

## 2019-08-13 MED ORDER — EPHEDRINE SULFATE 50 MG/ML IJ SOLN
INTRAMUSCULAR | Status: DC | PRN
  Administered 2019-08-13: 5 mg via INTRAVENOUS

## 2019-08-13 MED ORDER — BUPIVACAINE LIPOSOME 1.3 % IJ SUSP
INTRAMUSCULAR | Status: DC | PRN
  Administered 2019-08-13: 20 mL
  Administered 2019-08-13: 10 mL

## 2019-08-13 MED ORDER — LIDOCAINE-EPINEPHRINE 1 %-1:100000 IJ SOLN
INTRAMUSCULAR | Status: AC
  Filled 2019-08-13: qty 1

## 2019-08-13 MED ORDER — LIDOCAINE HCL (CARDIAC) PF 100 MG/5ML IV SOSY
PREFILLED_SYRINGE | INTRAVENOUS | Status: DC | PRN
  Administered 2019-08-13: 40 mg via INTRAVENOUS

## 2019-08-13 MED ORDER — BUPIVACAINE LIPOSOME 1.3 % IJ SUSP: 20.0000 mL | Freq: Once | INTRAMUSCULAR | Status: DC

## 2019-08-13 MED ORDER — OXYCODONE HCL 5 MG/5ML PO SOLN: 5.0000 mg | Freq: Once | ORAL | Status: DC | PRN

## 2019-08-13 MED ORDER — SODIUM CHLORIDE (PF) 0.9 % IJ SOLN
INTRAMUSCULAR | Status: AC
  Filled 2019-08-13: qty 10

## 2019-08-13 MED ORDER — SUGAMMADEX SODIUM 200 MG/2ML IV SOLN
INTRAVENOUS | Status: DC | PRN
  Administered 2019-08-13: 200 mg via INTRAVENOUS

## 2019-08-13 MED ORDER — CHLORHEXIDINE GLUCONATE CLOTH 2 % EX PADS: 6.0000 | MEDICATED_PAD | Freq: Once | CUTANEOUS | Status: DC

## 2019-08-13 MED ORDER — TRAMADOL HCL 50 MG PO TABS: 50.0000 mg | ORAL_TABLET | Freq: Four times a day (QID) | ORAL | 0 refills | Status: DC | PRN

## 2019-08-13 MED ORDER — FENTANYL CITRATE (PF) 100 MCG/2ML IJ SOLN: 25.0000 ug | INTRAMUSCULAR | Status: DC | PRN

## 2019-08-13 MED ORDER — FENTANYL CITRATE (PF) 100 MCG/2ML IJ SOLN
INTRAMUSCULAR | Status: AC
  Filled 2019-08-13: qty 2

## 2019-08-13 MED ORDER — PROPOFOL 10 MG/ML IV BOLUS
INTRAVENOUS | Status: DC | PRN
  Administered 2019-08-13: 90 mg via INTRAVENOUS

## 2019-08-13 MED ORDER — BUPIVACAINE HCL (PF) 0.5 % IJ SOLN
INTRAMUSCULAR | Status: AC
  Filled 2019-08-13: qty 30

## 2019-08-13 MED ORDER — BUPIVACAINE LIPOSOME 1.3 % IJ SUSP
INTRAMUSCULAR | Status: AC
  Filled 2019-08-13: qty 20

## 2019-08-13 MED ORDER — LACTATED RINGERS IV SOLN: INTRAVENOUS | Status: DC

## 2019-08-13 MED ORDER — METRONIDAZOLE IN NACL 5-0.79 MG/ML-% IV SOLN
INTRAVENOUS | Status: AC
  Filled 2019-08-13: qty 100

## 2019-08-13 MED ORDER — FENTANYL CITRATE (PF) 100 MCG/2ML IJ SOLN
INTRAMUSCULAR | Status: DC | PRN
  Administered 2019-08-13: 50 ug via INTRAVENOUS

## 2019-08-13 MED ORDER — ONDANSETRON HCL 4 MG/2ML IJ SOLN
INTRAMUSCULAR | Status: DC | PRN
  Administered 2019-08-13: 4 mg via INTRAVENOUS

## 2019-08-13 MED ORDER — OXYCODONE HCL 5 MG PO TABS: 5.0000 mg | ORAL_TABLET | Freq: Once | ORAL | Status: DC | PRN

## 2019-08-13 SURGICAL SUPPLY — 38 items
BLADE SURG 15 STRL LF DISP TIS (BLADE) ×1 IMPLANT
BLADE SURG 15 STRL SS (BLADE) ×2
CANISTER SUCT 1200ML W/VALVE (MISCELLANEOUS) ×2 IMPLANT
COVER MAYO STAND STRL (DRAPES) ×1 IMPLANT
COVER WAND RF STERILE (DRAPES) IMPLANT
DECANTER SPIKE VIAL GLASS SM (MISCELLANEOUS) ×2 IMPLANT
DRAPE UTILITY XL STRL (DRAPES) ×2 IMPLANT
DRSG PAD ABDOMINAL 8X10 ST (GAUZE/BANDAGES/DRESSINGS) IMPLANT
ELECT REM PT RETURN 9FT ADLT (ELECTROSURGICAL) ×2
ELECTRODE REM PT RTRN 9FT ADLT (ELECTROSURGICAL) ×1 IMPLANT
GAUZE SPONGE 4X4 12PLY STRL LF (GAUZE/BANDAGES/DRESSINGS) ×2 IMPLANT
GLOVE BIO SURGEON STRL SZ 6.5 (GLOVE) ×1 IMPLANT
GLOVE BIOGEL M 6.5 STRL (GLOVE) ×1 IMPLANT
GLOVE BIOGEL PI IND STRL 7.0 (GLOVE) IMPLANT
GLOVE BIOGEL PI IND STRL 7.5 (GLOVE) IMPLANT
GLOVE BIOGEL PI INDICATOR 7.0 (GLOVE) ×1
GLOVE BIOGEL PI INDICATOR 7.5 (GLOVE) ×1
GLOVE SURG ORTHO 8.0 STRL STRW (GLOVE) ×2 IMPLANT
GOWN STRL REUS W/ TWL LRG LVL3 (GOWN DISPOSABLE) ×1 IMPLANT
GOWN STRL REUS W/ TWL XL LVL3 (GOWN DISPOSABLE) ×1 IMPLANT
GOWN STRL REUS W/TWL LRG LVL3 (GOWN DISPOSABLE) ×2
GOWN STRL REUS W/TWL XL LVL3 (GOWN DISPOSABLE) ×4
NDL HYPO 25X1 1.5 SAFETY (NEEDLE) ×1 IMPLANT
NEEDLE HYPO 25X1 1.5 SAFETY (NEEDLE) ×2 IMPLANT
PACK BASIN DAY SURGERY FS (CUSTOM PROCEDURE TRAY) ×2 IMPLANT
PACK LITHOTOMY IV (CUSTOM PROCEDURE TRAY) IMPLANT
PENCIL SMOKE EVACUATOR (MISCELLANEOUS) ×2 IMPLANT
SHEARS HARMONIC 9CM CVD (BLADE) ×1 IMPLANT
SPONGE SURGIFOAM ABS GEL 12-7 (HEMOSTASIS) IMPLANT
SURGILUBE 2OZ TUBE FLIPTOP (MISCELLANEOUS) ×5 IMPLANT
SUT CHROMIC 3 0 SH 27 (SUTURE) ×3 IMPLANT
SYR CONTROL 10ML LL (SYRINGE) ×2 IMPLANT
TOWEL GREEN STERILE FF (TOWEL DISPOSABLE) ×4 IMPLANT
TRAY DSU PREP LF (CUSTOM PROCEDURE TRAY) ×2 IMPLANT
TRAY PROCTOSCOPIC FIBER OPTIC (SET/KITS/TRAYS/PACK) IMPLANT
TUBE CONNECTING 20X1/4 (TUBING) ×2 IMPLANT
UNDERPAD 30X36 HEAVY ABSORB (UNDERPADS AND DIAPERS) ×2 IMPLANT
YANKAUER SUCT BULB TIP NO VENT (SUCTIONS) ×2 IMPLANT

## 2019-08-13 NOTE — Anesthesia Preprocedure Evaluation (Addendum)
Anesthesia Evaluation  Patient identified by MRN, date of birth, ID band Patient awake    Reviewed: Allergy & Precautions, Patient's Chart, lab work & pertinent test results  Airway Mallampati: I  TM Distance: >3 FB Neck ROM: Full    Dental  (+) Upper Dentures, Lower Dentures   Pulmonary asthma ,    breath sounds clear to auscultation       Cardiovascular hypertension, Pt. on medications and Pt. on home beta blockers  Rhythm:Regular Rate:Normal     Neuro/Psych  Headaches, negative psych ROS   GI/Hepatic Neg liver ROS, GERD  Medicated,  Endo/Other  diabetes, Type 2, Oral Hypoglycemic Agents  Renal/GU negative Renal ROS     Musculoskeletal  (+) Arthritis ,   Abdominal Normal abdominal exam  (+)   Peds  Hematology negative hematology ROS (+)   Anesthesia Other Findings   Reproductive/Obstetrics                            Anesthesia Physical Anesthesia Plan  ASA: III  Anesthesia Plan: General   Post-op Pain Management:    Induction: Intravenous  PONV Risk Score and Plan: 3 and Ondansetron and Treatment may vary due to age or medical condition  Airway Management Planned: Oral ETT  Additional Equipment: None  Intra-op Plan:   Post-operative Plan: Extubation in OR  Informed Consent: I have reviewed the patients History and Physical, chart, labs and discussed the procedure including the risks, benefits and alternatives for the proposed anesthesia with the patient or authorized representative who has indicated his/her understanding and acceptance.     Dental advisory given  Plan Discussed with: CRNA  Anesthesia Plan Comments:        Anesthesia Quick Evaluation

## 2019-08-13 NOTE — Anesthesia Procedure Notes (Signed)
Procedure Name: Intubation Date/Time: 08/13/2019 7:37 AM Performed by: Lavonia Dana, CRNA Pre-anesthesia Checklist: Patient identified, Emergency Drugs available, Suction available and Patient being monitored Patient Re-evaluated:Patient Re-evaluated prior to induction Oxygen Delivery Method: Circle system utilized Preoxygenation: Pre-oxygenation with 100% oxygen Induction Type: IV induction Ventilation: Mask ventilation without difficulty and Oral airway inserted - appropriate to patient size Laryngoscope Size: Mac and 4 Grade View: Grade II Tube type: Oral Tube size: 7.5 mm Number of attempts: 1 Airway Equipment and Method: Stylet and Oral airway Placement Confirmation: ETT inserted through vocal cords under direct vision,  positive ETCO2 and breath sounds checked- equal and bilateral Secured at: 21 cm Tube secured with: Tape Dental Injury: Teeth and Oropharynx as per pre-operative assessment

## 2019-08-13 NOTE — Op Note (Signed)
Operative Note  Pre-operative Diagnosis:  Prolapsed internal hemorrhoids, grade IV  Post-operative Diagnosis:  same  Surgeon:  Darnell Level, MD  Assistant:  none   Procedure:  Open hemorrhoidectomy (left lateral column)  Anesthesia:  general  Estimated Blood Loss:  minimal  Drains: none         Specimen: to pathology  Indications:  Patient is referred by Dr. Catha Gosselin for surgical evaluation and management of chronic hemorrhoids. Patient states that he has had problems with hemorrhoids dating back 50 years. He has had rare occasions with bleeding. He has noted prolapse. He has noted some mild leakage with physical activity. He has not had any prior anorectal surgery. He presents today for hemorrhoidectomy.  Procedure:  The patient was seen in the pre-op holding area. The risks, benefits, complications, treatment options, and expected outcomes were previously discussed with the patient. The patient agreed with the proposed plan and has signed the informed consent form.  The patient was brought to the operating room by the surgical team, identified as [name] and the procedure verified. A "time out" was completed and the above information confirmed.  Following induction of general anesthesia, the patient is place in lithotomy and prepped and draped in the usual aseptic fashion.  Using the electrocautery, a V-shaped incision is made on the anoderm and some redundant tags are included in the resection.  Using the harmonic scalpel, dissection is continued in the submucosal plane into the anal canal, excising the left lateral column.  Dissection is carried up to the apex where a 3-0 Chromic gut suture is used for ligature and the hemorrhoidal column is excised completely with the harmonic scalpel.  The incision is then closed with the 3-0 chromic gut suture in a running, locking fashion out to the skin.  A prominent tag posteriorly is also excised with the harmonic scalpel and the  wound closed with a running 3-0 chromic gut suture.  Local anesthetic (Exparel + marcaine) is infiltrated around the entire operative site.  Good hemostasis is noted.  Wound is washed and dried and dry pad dressing is applied.  Patient is taken out of lithotomy and transported to the recovery room in stable condition.  The patient tolerated the procedure well.  Darnell Level, MD [facility] Office: [phone]

## 2019-08-13 NOTE — Transfer of Care (Signed)
Immediate Anesthesia Transfer of Care Note  Patient: David Sellers  Procedure(s) Performed: OPEN HEMORRHOIDECTOMY (N/A Rectum)  Patient Location: PACU  Anesthesia Type:General  Level of Consciousness: awake, alert  and oriented  Airway & Oxygen Therapy: Patient Spontanous Breathing and Patient connected to face mask oxygen  Post-op Assessment: Report given to RN and Post -op Vital signs reviewed and stable  Post vital signs: Reviewed and stable  Last Vitals:  Vitals Value Taken Time  BP 161/83 08/13/19 0822  Temp    Pulse 60 08/13/19 0826  Resp 17 08/13/19 0826  SpO2 100 % 08/13/19 0826  Vitals shown include unvalidated device data.  Last Pain:  Vitals:   08/13/19 0630  TempSrc: Oral  PainSc: 0-No pain         Complications: No complications documented.

## 2019-08-13 NOTE — Anesthesia Postprocedure Evaluation (Signed)
Anesthesia Post Note  Patient: David Sellers  Procedure(s) Performed: OPEN HEMORRHOIDECTOMY (N/A Rectum)     Patient location during evaluation: PACU Anesthesia Type: General Level of consciousness: awake and alert Pain management: pain level controlled Vital Signs Assessment: post-procedure vital signs reviewed and stable Respiratory status: spontaneous breathing, nonlabored ventilation, respiratory function stable and patient connected to nasal cannula oxygen Cardiovascular status: blood pressure returned to baseline and stable Postop Assessment: no apparent nausea or vomiting Anesthetic complications: no   No complications documented.  Last Vitals:  Vitals:   08/13/19 0912 08/13/19 1005  BP:  (!) 153/75  Pulse: (!) 56 60  Resp: 18 20  Temp:  36.6 C  SpO2: 97% 95%    Last Pain:  Vitals:   08/13/19 1005  TempSrc: Oral  PainSc: 0-No pain                 Shelton Silvas

## 2019-08-13 NOTE — Interval H&P Note (Signed)
History and Physical Interval Note:  08/13/2019 7:01 AM  David Sellers  has presented today for surgery, with the diagnosis of GRADE 4 PROLAPSED HEMORRHOIDS.  The various methods of treatment have been discussed with the patient and family. After consideration of risks, benefits and other options for treatment, the patient has consented to    Procedure(s) with comments: OPEN HEMORRHOIDECTOMY (N/A) - LMA as a surgical intervention.    The patient's history has been reviewed, patient examined, no change in status, stable for surgery.  I have reviewed the patient's chart and labs.  Questions were answered to the patient's satisfaction.    Darnell Level, MD [facility] Office: [phone]   Darnell Level

## 2019-08-13 NOTE — Discharge Instructions (Signed)
  Post Anesthesia Home Care Instructions  Activity: Get plenty of rest for the remainder of the day. A responsible individual must stay with you for 24 hours following the procedure.  For the next 24 hours, DO NOT: -Drive a car -Operate machinery -Drink alcoholic beverages -Take any medication unless instructed by your physician -Make any legal decisions or sign important papers.  Meals: Start with liquid foods such as gelatin or soup. Progress to regular foods as tolerated. Avoid greasy, spicy, heavy foods. If nausea and/or vomiting occur, drink only clear liquids until the nausea and/or vomiting subsides. Call your physician if vomiting continues.  Special Instructions/Symptoms: Your throat may feel dry or sore from the anesthesia or the breathing tube placed in your throat during surgery. If this causes discomfort, gargle with warm salt water. The discomfort should disappear within 24 hours.  If you had a scopolamine patch placed behind your ear for the management of post- operative nausea and/or vomiting:  1. The medication in the patch is effective for 72 hours, after which it should be removed.  Wrap patch in a tissue and discard in the trash. Wash hands thoroughly with soap and water. 2. You may remove the patch earlier than 72 hours if you experience unpleasant side effects which may include dry mouth, dizziness or visual disturbances. 3. Avoid touching the patch. Wash your hands with soap and water after contact with the patch.    Information for Discharge Teaching: EXPAREL (bupivacaine liposome injectable suspension)   Your surgeon or anesthesiologist gave you EXPAREL(bupivacaine) to help control your pain after surgery.  EXPAREL is a local anesthetic that provides pain relief by numbing the tissue around the surgical site. EXPAREL is designed to release pain medication over time and can control pain for up to 72 hours. Depending on how you respond to EXPAREL, you may require  less pain medication during your recovery.  Possible side effects: Temporary loss of sensation or ability to move in the area where bupivacaine was injected. Nausea, vomiting, constipation Rarely, numbness and tingling in your mouth or lips, lightheadedness, or anxiety may occur. Call your doctor right away if you think you may be experiencing any of these sensations, or if you have other questions regarding possible side effects.  Follow all other discharge instructions given to you by your surgeon or nurse. Eat a healthy diet and drink plenty of water or other fluids.  If you return to the hospital for any reason within 96 hours following the administration of EXPAREL, it is important for health care providers to know that you have received this anesthetic. A teal colored band has been placed on your arm with the date, time and amount of EXPAREL you have received in order to alert and inform your health care providers. Please leave this armband in place for the full 96 hours following administration, and then you may remove the band.  

## 2019-08-14 LAB — SURGICAL PATHOLOGY

## 2019-08-15 ENCOUNTER — Encounter (HOSPITAL_BASED_OUTPATIENT_CLINIC_OR_DEPARTMENT_OTHER): Payer: Self-pay | Admitting: Surgery

## 2019-08-30 DIAGNOSIS — M545 Low back pain: Secondary | ICD-10-CM | POA: Diagnosis not present

## 2019-09-05 DIAGNOSIS — H6123 Impacted cerumen, bilateral: Secondary | ICD-10-CM | POA: Diagnosis not present

## 2019-09-05 DIAGNOSIS — Z7984 Long term (current) use of oral hypoglycemic drugs: Secondary | ICD-10-CM | POA: Diagnosis not present

## 2019-09-05 DIAGNOSIS — E1169 Type 2 diabetes mellitus with other specified complication: Secondary | ICD-10-CM | POA: Diagnosis not present

## 2019-09-05 DIAGNOSIS — E782 Mixed hyperlipidemia: Secondary | ICD-10-CM | POA: Diagnosis not present

## 2019-09-05 DIAGNOSIS — I779 Disorder of arteries and arterioles, unspecified: Secondary | ICD-10-CM | POA: Diagnosis not present

## 2019-09-05 DIAGNOSIS — I10 Essential (primary) hypertension: Secondary | ICD-10-CM | POA: Diagnosis not present

## 2019-09-05 DIAGNOSIS — N401 Enlarged prostate with lower urinary tract symptoms: Secondary | ICD-10-CM | POA: Diagnosis not present

## 2019-09-13 DIAGNOSIS — N433 Hydrocele, unspecified: Secondary | ICD-10-CM | POA: Diagnosis not present

## 2019-09-19 DIAGNOSIS — I10 Essential (primary) hypertension: Secondary | ICD-10-CM | POA: Diagnosis not present

## 2019-09-19 DIAGNOSIS — Z6824 Body mass index (BMI) 24.0-24.9, adult: Secondary | ICD-10-CM | POA: Diagnosis not present

## 2019-09-19 DIAGNOSIS — E1165 Type 2 diabetes mellitus with hyperglycemia: Secondary | ICD-10-CM | POA: Diagnosis not present

## 2019-09-19 DIAGNOSIS — E119 Type 2 diabetes mellitus without complications: Secondary | ICD-10-CM | POA: Diagnosis not present

## 2019-09-19 DIAGNOSIS — I779 Disorder of arteries and arterioles, unspecified: Secondary | ICD-10-CM | POA: Diagnosis not present

## 2019-09-25 DIAGNOSIS — N401 Enlarged prostate with lower urinary tract symptoms: Secondary | ICD-10-CM | POA: Diagnosis not present

## 2019-09-25 DIAGNOSIS — I10 Essential (primary) hypertension: Secondary | ICD-10-CM | POA: Diagnosis not present

## 2019-09-25 DIAGNOSIS — E119 Type 2 diabetes mellitus without complications: Secondary | ICD-10-CM | POA: Diagnosis not present

## 2019-09-25 DIAGNOSIS — E782 Mixed hyperlipidemia: Secondary | ICD-10-CM | POA: Diagnosis not present

## 2019-09-25 DIAGNOSIS — E1169 Type 2 diabetes mellitus with other specified complication: Secondary | ICD-10-CM | POA: Diagnosis not present

## 2019-10-03 DIAGNOSIS — I10 Essential (primary) hypertension: Secondary | ICD-10-CM | POA: Diagnosis not present

## 2019-10-03 DIAGNOSIS — E1165 Type 2 diabetes mellitus with hyperglycemia: Secondary | ICD-10-CM | POA: Diagnosis not present

## 2019-10-03 DIAGNOSIS — E119 Type 2 diabetes mellitus without complications: Secondary | ICD-10-CM | POA: Diagnosis not present

## 2019-10-03 DIAGNOSIS — Z6824 Body mass index (BMI) 24.0-24.9, adult: Secondary | ICD-10-CM | POA: Diagnosis not present

## 2019-10-03 DIAGNOSIS — I779 Disorder of arteries and arterioles, unspecified: Secondary | ICD-10-CM | POA: Diagnosis not present

## 2019-10-08 ENCOUNTER — Other Ambulatory Visit: Payer: Self-pay

## 2019-10-08 DIAGNOSIS — I6522 Occlusion and stenosis of left carotid artery: Secondary | ICD-10-CM

## 2019-10-11 DIAGNOSIS — L308 Other specified dermatitis: Secondary | ICD-10-CM | POA: Diagnosis not present

## 2019-10-11 DIAGNOSIS — L820 Inflamed seborrheic keratosis: Secondary | ICD-10-CM | POA: Diagnosis not present

## 2019-10-16 DIAGNOSIS — E118 Type 2 diabetes mellitus with unspecified complications: Secondary | ICD-10-CM | POA: Diagnosis not present

## 2019-10-16 DIAGNOSIS — E1165 Type 2 diabetes mellitus with hyperglycemia: Secondary | ICD-10-CM | POA: Diagnosis not present

## 2019-10-21 ENCOUNTER — Ambulatory Visit: Payer: PPO

## 2019-10-21 ENCOUNTER — Encounter (HOSPITAL_COMMUNITY): Payer: PPO

## 2019-10-30 ENCOUNTER — Ambulatory Visit (INDEPENDENT_AMBULATORY_CARE_PROVIDER_SITE_OTHER): Payer: PPO | Admitting: Physician Assistant

## 2019-10-30 ENCOUNTER — Other Ambulatory Visit: Payer: Self-pay

## 2019-10-30 ENCOUNTER — Ambulatory Visit (HOSPITAL_COMMUNITY)
Admission: RE | Admit: 2019-10-30 | Discharge: 2019-10-30 | Disposition: A | Discharge status: Home / Self Care | Payer: PPO | Source: Ambulatory Visit | Attending: Vascular Surgery | Admitting: Vascular Surgery

## 2019-10-30 VITALS — BP 174/82 | HR 56 | Temp 98.1°F | Resp 20 | Ht 66.0 in | Wt 145.7 lb

## 2019-10-30 DIAGNOSIS — I779 Disorder of arteries and arterioles, unspecified: Secondary | ICD-10-CM | POA: Diagnosis not present

## 2019-10-30 DIAGNOSIS — I6522 Occlusion and stenosis of left carotid artery: Secondary | ICD-10-CM | POA: Diagnosis not present

## 2019-10-30 DIAGNOSIS — Z9889 Other specified postprocedural states: Secondary | ICD-10-CM | POA: Diagnosis not present

## 2019-10-30 NOTE — Progress Notes (Signed)
Established Carotid Patient   History of Present Illness   David Sellers is a 79 y.o. ([birth year]) male who presents with chief complaint: carotid artery stenosis.  Surgical history significant for left carotid endarterectomy by Dr. Darrick Penna in 2015.  Since last office visit last year he denies any diagnosis of TIA or CVA.  He denies any strokelike symptoms including slurring speech, changes in vision, or one-sided weakness.  He is taking an aspirin and statin daily.  He is following regularly with his PCP for management of chronic medical conditions including hypertension and diabetes mellitus.  He denies tobacco use.  The patient's PMH, PSH, SH, and FamHx were reviewed and are unchanged from prior visit.  Current Outpatient Medications  Medication Sig Dispense Refill  . acetaminophen (TYLENOL) 650 MG CR tablet Take 1,300 mg by mouth every 8 (eight) hours as needed for pain.    Marland Kitchen aspirin EC 81 MG tablet Take 81 mg by mouth every morning.    . carvedilol (COREG) 3.125 MG tablet Take 3.125 mg by mouth 2 (two) times daily with a meal.    . cetirizine (ZYRTEC) 10 MG tablet Take 10 mg by mouth daily.    . Cyanocobalamin (B-12) 1000 MCG SUBL     . doxazosin (CARDURA) 8 MG tablet Take 8 mg by mouth 2 (two) times daily. Takes 1/2 tablet    . KRILL OIL PO Take 1 capsule by mouth 2 (two) times daily.     Marland Kitchen losartan (COZAAR) 100 MG tablet Take 100 mg by mouth daily.  6  . lovastatin (MEVACOR) 20 MG tablet Take 20 mg by mouth at bedtime.    . Magnesium Oxide -Mg Supplement (MAGNESIUM EXTRA STRENGTH) 400 MG CAPS     . metFORMIN (GLUCOPHAGE-XR) 500 MG 24 hr tablet SMARTSIG:1 Tablet(s) By Mouth Every Evening    . omeprazole (PRILOSEC) 20 MG capsule Take 20 mg by mouth every morning.    Letta Pate VERIO test strip 2 (two) times daily.    . verapamil (CALAN) 120 MG tablet Take 120 mg by mouth at bedtime.     . verapamil (COVERA HS) 240 MG (CO) 24 hr tablet Take 240 mg by mouth every morning.     No  current facility-administered medications for this visit.    REVIEW OF SYSTEMS (negative unless checked):   Cardiac:  []  Chest pain or chest pressure? []  Shortness of breath upon activity? []  Shortness of breath when lying flat? []  Irregular heart rhythm?  Vascular:  []  Pain in calf, thigh, or hip brought on by walking? []  Pain in feet at night that wakes you up from your sleep? []  Blood clot in your veins? []  Leg swelling?  Pulmonary:  []  Oxygen at home? []  Productive cough? []  Wheezing?  Neurologic:  []  Sudden weakness in arms or legs? []  Sudden numbness in arms or legs? []  Sudden onset of difficult speaking or slurred speech? []  Temporary loss of vision in one eye? []  Problems with dizziness?  Gastrointestinal:  []  Blood in stool? []  Vomited blood?  Genitourinary:  []  Burning when urinating? []  Blood in urine?  Psychiatric:  []  Major depression  Hematologic:  []  Bleeding problems? []  Problems with blood clotting?  Dermatologic:  []  Rashes or ulcers?  Constitutional:  []  Fever or chills?  Ear/Nose/Throat:  []  Change in hearing? []  Nose bleeds? []  Sore throat?  Musculoskeletal:  []  Back pain? []  Joint pain? []  Muscle pain?   Physical Examination   Vitals:  10/30/19 1036 10/30/19 1037  BP: (!) 185/83 (!) 174/82  Pulse: (!) 56   Resp: 20   Temp: 98.1 F (36.7 C)   TempSrc: Temporal   SpO2: 99%   Weight: 145 lb 11.2 oz (66.1 kg)   Height: 5\' 6"  (1.676 m)    Body mass index is 23.52 kg/m.  General:  WDWN in NAD; vital signs documented above Gait: Not observed HENT: WNL, normocephalic Pulmonary: normal non-labored breathing Cardiac: regular HR Abdomen: soft, NT, no masses Skin: without rashes Vascular Exam/Pulses:  Right Left  Radial 2+ (normal) 2+ (normal)   Extremities: without ischemic changes, without Gangrene , without cellulitis; without open wounds;  Musculoskeletal: no muscle wasting or atrophy  Neurologic: CN grossly  intact Psychiatric:  The pt has Normal affect.  Non-Invasive Vascular Imaging   B Carotid Duplex :   R ICA stenosis:  40-59%  R VA:  patent and antegrade  L ICA stenosis:  1-39%  L VA:  patent and antegrade   Medical Decision Making   David Sellers is a 79 y.o. male who presents for surveillance of carotid artery stenosis with history of left CEA in 2015  -Left ICA stenosis 1 to 39% this is unchanged compared to prior study -Right ICA stenosis has increased from 1 to 39% up to 40 to 59% however velocities are very similar to prior study -Continue aspirin and statin daily -Continue to follow regularly with PCP for management of chronic medical conditions including hypertension and diabetes mellitus -Because right ICA velocities are essentially unchanged we will continue annual follow-up and repeat carotid duplex in 1 year  2016 [name] [facility] Office: [phone]  Clinic MD: Dr. [phone]

## 2019-11-07 DIAGNOSIS — E782 Mixed hyperlipidemia: Secondary | ICD-10-CM | POA: Diagnosis not present

## 2019-11-07 DIAGNOSIS — I10 Essential (primary) hypertension: Secondary | ICD-10-CM | POA: Diagnosis not present

## 2019-11-07 DIAGNOSIS — E1169 Type 2 diabetes mellitus with other specified complication: Secondary | ICD-10-CM | POA: Diagnosis not present

## 2019-11-07 DIAGNOSIS — N401 Enlarged prostate with lower urinary tract symptoms: Secondary | ICD-10-CM | POA: Diagnosis not present

## 2019-11-07 DIAGNOSIS — E119 Type 2 diabetes mellitus without complications: Secondary | ICD-10-CM | POA: Diagnosis not present

## 2019-11-11 DIAGNOSIS — L501 Idiopathic urticaria: Secondary | ICD-10-CM | POA: Diagnosis not present

## 2019-11-11 DIAGNOSIS — B351 Tinea unguium: Secondary | ICD-10-CM | POA: Diagnosis not present

## 2019-11-12 ENCOUNTER — Ambulatory Visit (INDEPENDENT_AMBULATORY_CARE_PROVIDER_SITE_OTHER): Payer: PPO | Admitting: Cardiovascular Disease

## 2019-11-12 ENCOUNTER — Other Ambulatory Visit: Payer: Self-pay

## 2019-11-12 ENCOUNTER — Encounter: Payer: Self-pay | Admitting: Cardiovascular Disease

## 2019-11-12 VITALS — BP 148/98 | HR 66 | Ht 66.0 in | Wt 144.2 lb

## 2019-11-12 DIAGNOSIS — I10 Essential (primary) hypertension: Secondary | ICD-10-CM

## 2019-11-12 DIAGNOSIS — E1169 Type 2 diabetes mellitus with other specified complication: Secondary | ICD-10-CM | POA: Diagnosis not present

## 2019-11-12 DIAGNOSIS — I6523 Occlusion and stenosis of bilateral carotid arteries: Secondary | ICD-10-CM

## 2019-11-12 DIAGNOSIS — N401 Enlarged prostate with lower urinary tract symptoms: Secondary | ICD-10-CM | POA: Diagnosis not present

## 2019-11-12 DIAGNOSIS — E782 Mixed hyperlipidemia: Secondary | ICD-10-CM | POA: Diagnosis not present

## 2019-11-12 DIAGNOSIS — E119 Type 2 diabetes mellitus without complications: Secondary | ICD-10-CM | POA: Diagnosis not present

## 2019-11-12 NOTE — Patient Instructions (Signed)
  Follow-Up: At [facility], you and your health needs are our priority.  As part of our continuing mission to provide you with exceptional heart care, we have created designated Provider Care Teams.  These Care Teams include your primary Cardiologist (physician) and Advanced Practice Providers (APPs -  Physician Assistants and Nurse Practitioners) who all work together to provide you with the care you need, when you need it.  We recommend signing up for the patient portal called "MyChart".  Sign up information is provided on this After Visit Summary.  MyChart is used to connect with patients for Virtual Visits (Telemedicine).  Patients are able to view lab/test results, encounter notes, upcoming appointments, etc.  Non-urgent messages can be sent to your provider as well.   To learn more about what you can do with MyChart, go to [email or web address].    Your next appointment:   12 month(s)  The format for your next appointment:   In Person  Provider:   You may see JONATHAN BERRY MD or one of the following Advanced Practice Providers on your designated Care Team:    Luke Kilroy, [name]  Callie Goodrich, [name]  Jesse Cleaver, FNP     

## 2019-11-12 NOTE — Assessment & Plan Note (Signed)
History of essential hypertension a blood pressure measured today in the office of 148/98.  He does keep a very accurate log of his blood pressures at home which revealed him to be in the normal range.  Actually they were fairly low as a result of diet and weight loss and his PCP did decrease his verapamil from 240 to 120 mg a day.  Currently he is on losartan 100 mg a day and verapamil 120 mg a day.

## 2019-11-12 NOTE — Progress Notes (Signed)
11/12/2019 David Sellers   [birth year]  [id number]  Primary Physician Catha Gosselin, MD Primary Cardiologist: Runell Gess MD Nicholes Calamity, [place]  HPI:  David Sellers is a 79 y.o.  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 Fabienne Sellers for cardiovascular evaluation and preoperative screening prior to elective left carotid endarterectomy. I last saw him in the office10/03/2018. His cardiovascular factor profile is positive for hypertension, dyslipidemia, and family history of heart disease with a father who died of a myocardial infarction in his 69s. He has never had a heart attack or stroke. He denies chest pain or shortness of breath. He did have pain in his left neck and had incidentally found moderate left internal carotid artery stenosis. A Myoview stress test performed for preoperative clearance 04/23/13 was entirely normal. He underwent uncomplicated left carotid endarterectomy by Dr. Darrick Sellers on 05/08/13 with excellent operative result although the patient does have some left facial numbness as result. SinceI saw him a year ago he has remained stable.  He is been under a lot of stress lately with his wife having kidney infection him having upper respiratory tract infection although he checks his blood pressure at home and it runs in the 135/70 range.   Since I saw him a year ago he is done well.  He was diagnosed with diabetes and is seeing an endocrinologist.  He is placed on a diet is lost 15 pounds.  Blood pressures under better control.  He denies chest pain or shortness of breath.   Current Meds  Medication Sig   acetaminophen (TYLENOL) 650 MG CR tablet Take 1,300 mg by mouth every 8 (eight) hours as needed for pain.   aspirin EC 81 MG tablet Take 81 mg by mouth every morning.   cetirizine (ZYRTEC) 10 MG tablet Take 10 mg by mouth daily.   doxazosin (CARDURA) 8 MG tablet Take 8 mg by mouth 2 (two) times  daily. Takes 1/2 tablet   KRILL OIL PO Take 1 capsule by mouth 2 (two) times daily.    losartan (COZAAR) 100 MG tablet Take 100 mg by mouth daily.   lovastatin (MEVACOR) 20 MG tablet Take 20 mg by mouth at bedtime.   metFORMIN HCl ER 500 MG/5ML SRER    omeprazole (PRILOSEC) 20 MG capsule Take 20 mg by mouth every morning.   ONETOUCH VERIO test strip 2 (two) times daily.   SHINGRIX injection    terbinafine (LAMISIL) 250 MG tablet    verapamil (CALAN) 120 MG tablet Take 120 mg by mouth at bedtime.      Allergies  Allergen Reactions   Meloxicam Itching, Palpitations and Other (See Comments)    Headaches, Mood swings.   Morphine And Related Nausea And Vomiting   Statins Other (See Comments)    Severe arthritic response   Cholestoff [Plant Sterols And Stanols] Other (See Comments)    Muscle aches   Nsaids Other (See Comments)    Aleve ,  Myalgia   Atorvastatin     Severe myalgias   Cortisone Hypertension    Patient states cortisone injections he has received for his knees cause hypertension and hyperglycemia   Darvon [Propoxyphene] Other (See Comments)    hallucinate   Glimepiride Itching   Indomethacin     Neck pain flare   Penicillins     *positive allergy test*   Pravastatin     myalgias   Sulfa Antibiotics  Nausea And Vomiting   Tramadol     Social History   Socioeconomic History   Marital status: Married    Spouse name: Not on file   Number of children: Not on file   Years of education: Not on file   Highest education level: Not on file  Occupational History   Not on file  Tobacco Use   Smoking status: Never Smoker   Smokeless tobacco: Never Used  Vaping Use   Vaping Use: Never used  Substance and Sexual Activity   Alcohol use: No   Drug use: No   Sexual activity: Yes  Other Topics Concern   Not on file  Social History Narrative   Not on file   Social Determinants of Health   Financial Resource Strain:     Difficulty of Paying Living Expenses: Not on file  Food Insecurity:    Worried About Running Out of Food in the Last Year: Not on file   Ran Out of Food in the Last Year: Not on file  Transportation Needs:    Lack of Transportation (Medical): Not on file   Lack of Transportation (Non-Medical): Not on file  Physical Activity:    Days of Exercise per Week: Not on file   Minutes of Exercise per Session: Not on file  Stress:    Feeling of Stress : Not on file  Social Connections:    Frequency of Communication with Friends and Family: Not on file   Frequency of Social Gatherings with Friends and Family: Not on file   Attends Religious Services: Not on file   Active Member of Clubs or Organizations: Not on file   Attends Banker Meetings: Not on file   Marital Status: Not on file  Intimate Partner Violence:    Fear of Current or Ex-Partner: Not on file   Emotionally Abused: Not on file   Physically Abused: Not on file   Sexually Abused: Not on file     Review of Systems: General: negative for chills, fever, night sweats or weight changes.  Cardiovascular: negative for chest pain, dyspnea on exertion, edema, orthopnea, palpitations, paroxysmal nocturnal dyspnea or shortness of breath Dermatological: negative for rash Respiratory: negative for cough or wheezing Urologic: negative for hematuria Abdominal: negative for nausea, vomiting, diarrhea, bright red blood per rectum, melena, or hematemesis Neurologic: negative for visual changes, syncope, or dizziness All other systems reviewed and are otherwise negative except as noted above.    Blood pressure (!) 148/98, pulse 66, height 5\' 6"  (1.676 m), weight 144 lb 3.2 oz (65.4 kg).  General appearance: alert and no distress Neck: no adenopathy, no carotid bruit, no JVD, supple, symmetrical, trachea midline and thyroid not enlarged, symmetric, no tenderness/mass/nodules Lungs: clear to auscultation  bilaterally Heart: regular rate and rhythm, S1, S2 normal, no murmur, click, rub or gallop Extremities: extremities normal, atraumatic, no cyanosis or edema Pulses: 2+ and symmetric Skin: Skin color, texture, turgor normal. No rashes or lesions Neurologic: Alert and oriented X 3, normal strength and tone. Normal symmetric reflexes. Normal coordination and gait  EKG sinus rhythm at 66 with nonspecific ST and T wave changes.  I personally reviewed this EKG.  ASSESSMENT AND PLAN:   Carotid artery disease History of carotid artery disease status post uncomplicated left carotid endarterectomy performed by Dr. .  He gets annual Doppler studies which most recently performed 10/30/2019 showed moderate right and mild left ICA stenosis.  This will be repeated on annual basis.  Essential hypertension  History of essential hypertension a blood pressure measured today in the office of 148/98.  He does keep a very accurate log of his blood pressures at home which revealed him to be in the normal range.  Actually they were fairly low as a result of diet and weight loss and his PCP did decrease his verapamil from 240 to 120 mg a day.  Currently he is on losartan 100 mg a day and verapamil 120 mg a day.  Hyperlipidemia History of mild hyperlipidemia not on statin therapy with lipid profile performed 08/08/2019 revealing total cholesterol 178, LDL 102 and HDL 53.      Runell Gess MD [facility], [facility] 11/12/2019 10:44 AM

## 2019-11-12 NOTE — Assessment & Plan Note (Signed)
History of mild hyperlipidemia not on statin therapy with lipid profile performed 08/08/2019 revealing total cholesterol 178, LDL 102 and HDL 53.

## 2019-11-12 NOTE — Assessment & Plan Note (Signed)
History of carotid artery disease status post uncomplicated left carotid endarterectomy performed by Dr. Steva Ready.  He gets annual Doppler studies which most recently performed 10/30/2019 showed moderate right and mild left ICA stenosis.  This will be repeated on annual basis.

## 2019-12-12 DIAGNOSIS — E1165 Type 2 diabetes mellitus with hyperglycemia: Secondary | ICD-10-CM | POA: Diagnosis not present

## 2019-12-12 DIAGNOSIS — I10 Essential (primary) hypertension: Secondary | ICD-10-CM | POA: Diagnosis not present

## 2019-12-12 DIAGNOSIS — E118 Type 2 diabetes mellitus with unspecified complications: Secondary | ICD-10-CM | POA: Diagnosis not present

## 2019-12-30 DIAGNOSIS — E1169 Type 2 diabetes mellitus with other specified complication: Secondary | ICD-10-CM | POA: Diagnosis not present

## 2019-12-30 DIAGNOSIS — E782 Mixed hyperlipidemia: Secondary | ICD-10-CM | POA: Diagnosis not present

## 2019-12-30 DIAGNOSIS — E119 Type 2 diabetes mellitus without complications: Secondary | ICD-10-CM | POA: Diagnosis not present

## 2019-12-30 DIAGNOSIS — N401 Enlarged prostate with lower urinary tract symptoms: Secondary | ICD-10-CM | POA: Diagnosis not present

## 2019-12-30 DIAGNOSIS — I10 Essential (primary) hypertension: Secondary | ICD-10-CM | POA: Diagnosis not present

## 2019-12-30 DIAGNOSIS — K219 Gastro-esophageal reflux disease without esophagitis: Secondary | ICD-10-CM | POA: Diagnosis not present

## 2020-02-18 DIAGNOSIS — E1165 Type 2 diabetes mellitus with hyperglycemia: Secondary | ICD-10-CM | POA: Diagnosis not present

## 2020-02-18 DIAGNOSIS — I10 Essential (primary) hypertension: Secondary | ICD-10-CM | POA: Diagnosis not present

## 2020-02-21 DIAGNOSIS — J189 Pneumonia, unspecified organism: Secondary | ICD-10-CM | POA: Diagnosis not present

## 2020-02-25 DIAGNOSIS — E538 Deficiency of other specified B group vitamins: Secondary | ICD-10-CM | POA: Diagnosis not present

## 2020-02-25 DIAGNOSIS — E1165 Type 2 diabetes mellitus with hyperglycemia: Secondary | ICD-10-CM | POA: Diagnosis not present

## 2020-02-25 DIAGNOSIS — Z6824 Body mass index (BMI) 24.0-24.9, adult: Secondary | ICD-10-CM | POA: Diagnosis not present

## 2020-02-25 DIAGNOSIS — I10 Essential (primary) hypertension: Secondary | ICD-10-CM | POA: Diagnosis not present

## 2020-02-25 DIAGNOSIS — I779 Disorder of arteries and arterioles, unspecified: Secondary | ICD-10-CM | POA: Diagnosis not present

## 2020-02-25 DIAGNOSIS — E119 Type 2 diabetes mellitus without complications: Secondary | ICD-10-CM | POA: Diagnosis not present

## 2020-02-27 DIAGNOSIS — Z20828 Contact with and (suspected) exposure to other viral communicable diseases: Secondary | ICD-10-CM | POA: Diagnosis not present

## 2020-02-27 DIAGNOSIS — J189 Pneumonia, unspecified organism: Secondary | ICD-10-CM | POA: Diagnosis not present

## 2020-03-11 DIAGNOSIS — J189 Pneumonia, unspecified organism: Secondary | ICD-10-CM | POA: Diagnosis not present

## 2020-03-16 DIAGNOSIS — N400 Enlarged prostate without lower urinary tract symptoms: Secondary | ICD-10-CM | POA: Diagnosis not present

## 2020-03-16 DIAGNOSIS — I10 Essential (primary) hypertension: Secondary | ICD-10-CM | POA: Diagnosis not present

## 2020-03-16 DIAGNOSIS — E1159 Type 2 diabetes mellitus with other circulatory complications: Secondary | ICD-10-CM | POA: Diagnosis not present

## 2020-03-16 DIAGNOSIS — K219 Gastro-esophageal reflux disease without esophagitis: Secondary | ICD-10-CM | POA: Diagnosis not present

## 2020-03-16 DIAGNOSIS — Z8709 Personal history of other diseases of the respiratory system: Secondary | ICD-10-CM | POA: Diagnosis not present

## 2020-03-16 DIAGNOSIS — E785 Hyperlipidemia, unspecified: Secondary | ICD-10-CM | POA: Diagnosis not present

## 2020-03-16 DIAGNOSIS — J189 Pneumonia, unspecified organism: Secondary | ICD-10-CM | POA: Diagnosis not present

## 2020-03-16 DIAGNOSIS — I779 Disorder of arteries and arterioles, unspecified: Secondary | ICD-10-CM | POA: Diagnosis not present

## 2020-03-16 DIAGNOSIS — R059 Cough, unspecified: Secondary | ICD-10-CM | POA: Diagnosis not present

## 2020-03-18 DIAGNOSIS — H31002 Unspecified chorioretinal scars, left eye: Secondary | ICD-10-CM | POA: Diagnosis not present

## 2020-03-18 DIAGNOSIS — E119 Type 2 diabetes mellitus without complications: Secondary | ICD-10-CM | POA: Diagnosis not present

## 2020-03-18 DIAGNOSIS — H1789 Other corneal scars and opacities: Secondary | ICD-10-CM | POA: Diagnosis not present

## 2020-03-18 DIAGNOSIS — H524 Presbyopia: Secondary | ICD-10-CM | POA: Diagnosis not present

## 2020-03-23 ENCOUNTER — Other Ambulatory Visit: Payer: Self-pay | Admitting: Family Medicine

## 2020-03-23 DIAGNOSIS — R059 Cough, unspecified: Secondary | ICD-10-CM

## 2020-03-23 DIAGNOSIS — R9389 Abnormal findings on diagnostic imaging of other specified body structures: Secondary | ICD-10-CM

## 2020-03-28 ENCOUNTER — Ambulatory Visit
Admission: RE | Admit: 2020-03-28 | Discharge: 2020-03-28 | Disposition: A | Discharge status: Home / Self Care | Payer: PPO | Source: Ambulatory Visit | Attending: Family Medicine | Admitting: Family Medicine

## 2020-03-28 ENCOUNTER — Other Ambulatory Visit: Payer: Self-pay

## 2020-03-28 DIAGNOSIS — Z8701 Personal history of pneumonia (recurrent): Secondary | ICD-10-CM | POA: Diagnosis not present

## 2020-03-28 DIAGNOSIS — J90 Pleural effusion, not elsewhere classified: Secondary | ICD-10-CM | POA: Diagnosis not present

## 2020-03-28 DIAGNOSIS — J984 Other disorders of lung: Secondary | ICD-10-CM | POA: Diagnosis not present

## 2020-03-28 DIAGNOSIS — I2510 Atherosclerotic heart disease of native coronary artery without angina pectoris: Secondary | ICD-10-CM | POA: Diagnosis not present

## 2020-03-28 DIAGNOSIS — R9389 Abnormal findings on diagnostic imaging of other specified body structures: Secondary | ICD-10-CM

## 2020-03-28 DIAGNOSIS — R059 Cough, unspecified: Secondary | ICD-10-CM

## 2020-03-28 IMAGING — CT CT CHEST W/O CM
1 series · 14 of 34 positions shown, 18 images · non-contrast
Comparison: [DATE] chest radiograph.

CLINICAL DATA: Reported left costophrenic angle pneumonia on
outside chest radiograph report. Persisting cough.

EXAM:
CT CHEST WITHOUT CONTRAST
TECHNIQUE: Multidetector CT imaging of the chest was performed following the
standard protocol without IV contrast.

[Series 2: chest w/(date) · axial · 0.68mm/px · z∈[-324,-48]mm · 14 of 162 slices shown, 18 images]
[im 12/162  mediastinal]
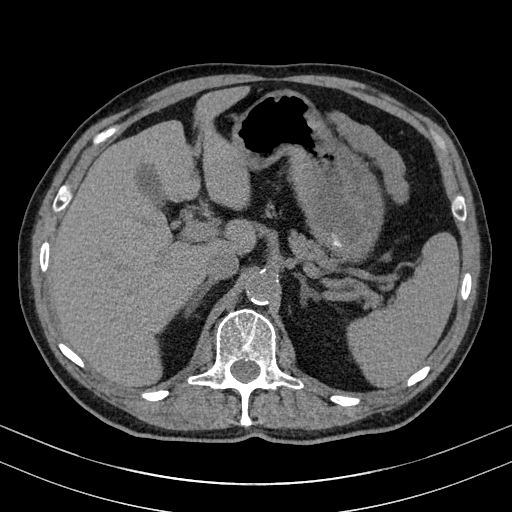
[im 12/162  lung]
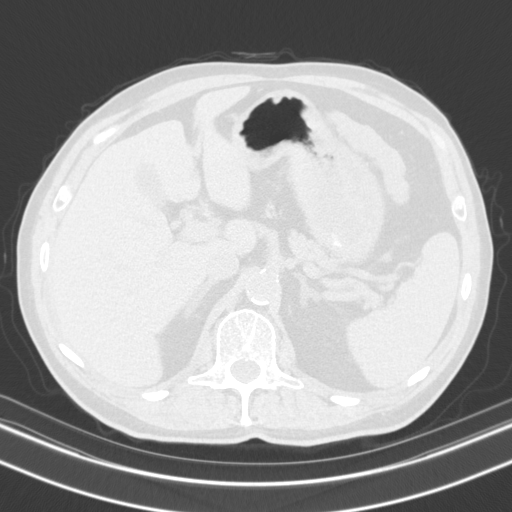
[im 24/162  lung]
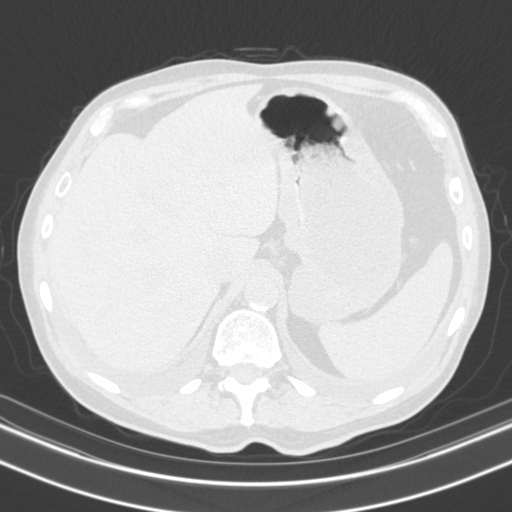
[im 33/162  lung]
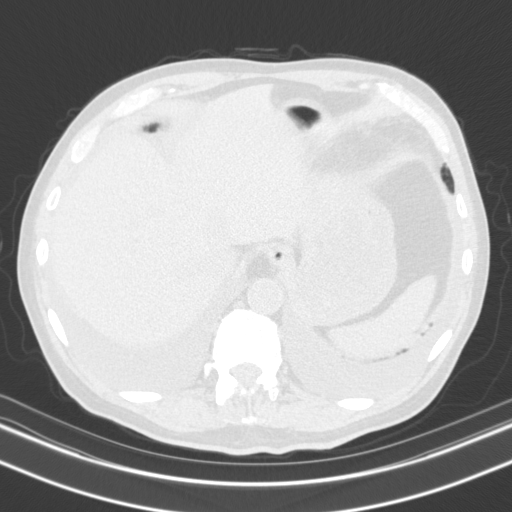
[im 48/162  lung]
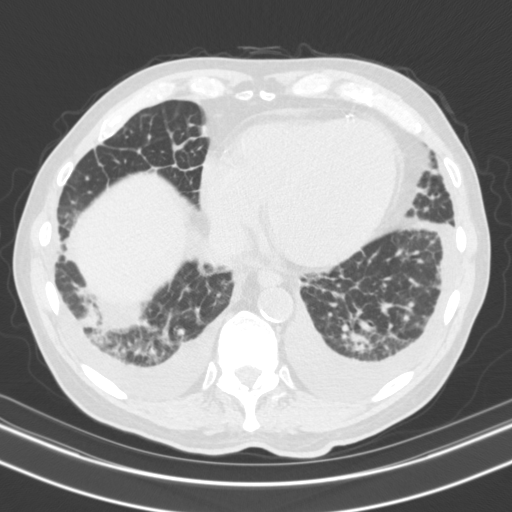
[im 60/162  mediastinal]
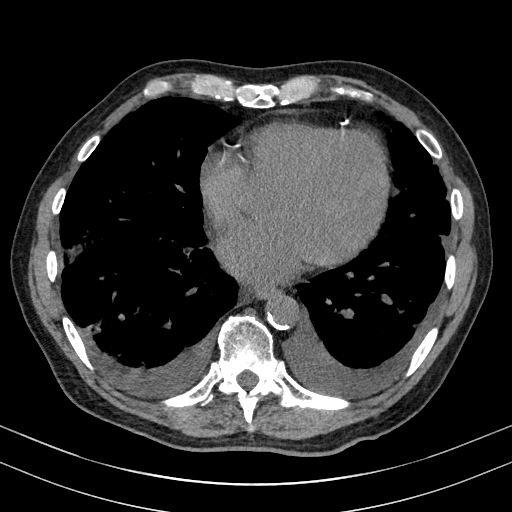
[im 60/162  lung]
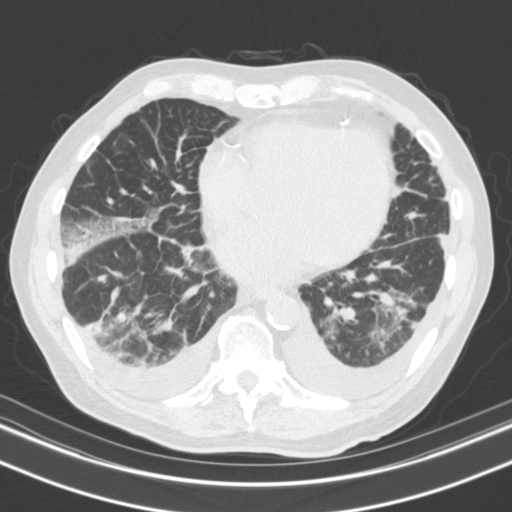
[im 66/162  lung]
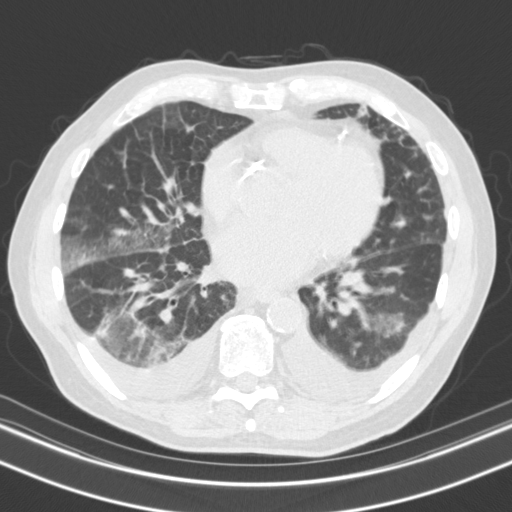
[im 77/162  lung]
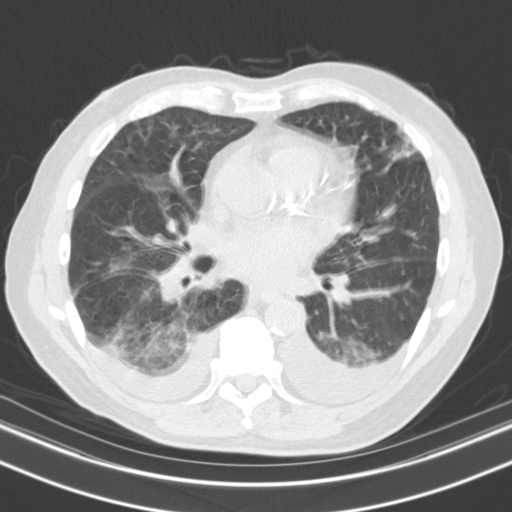
[im 86/162  lung]
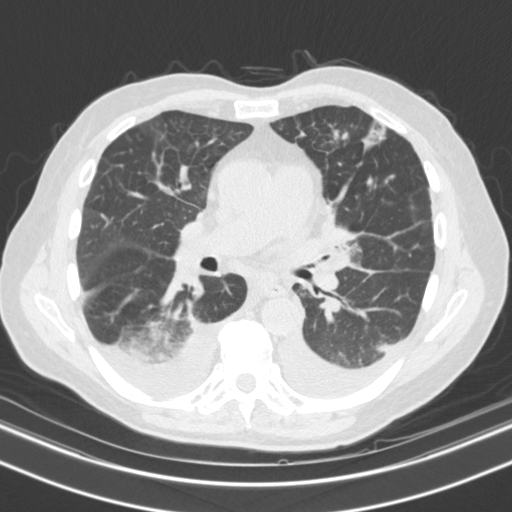
[im 96/162  mediastinal]
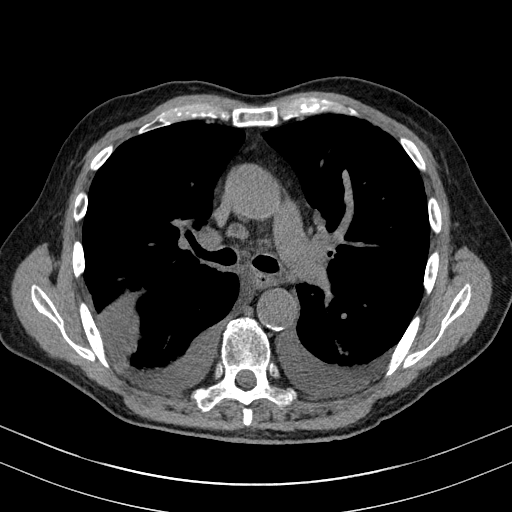
[im 96/162  lung]
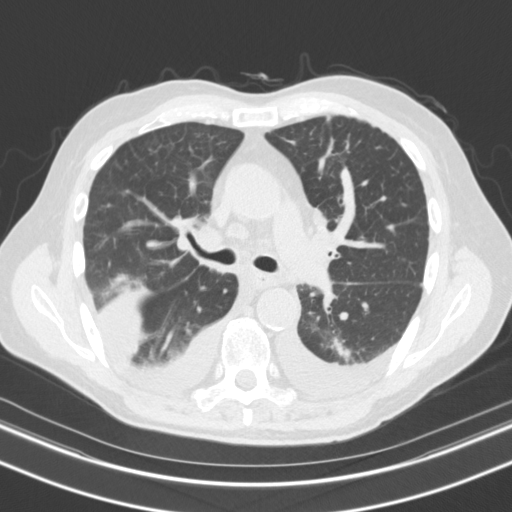
[im 102/162  lung]
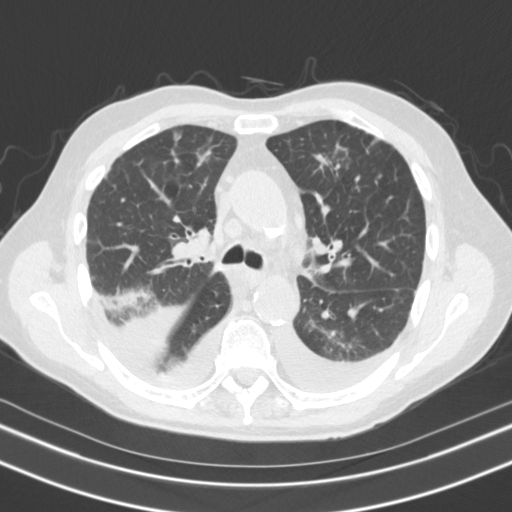
[im 120/162  lung]
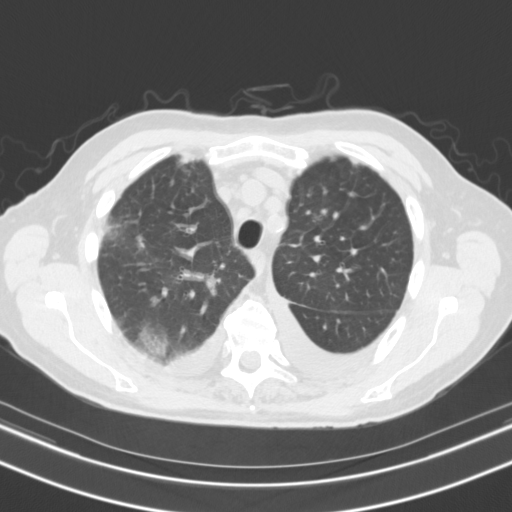
[im 129/162  lung]
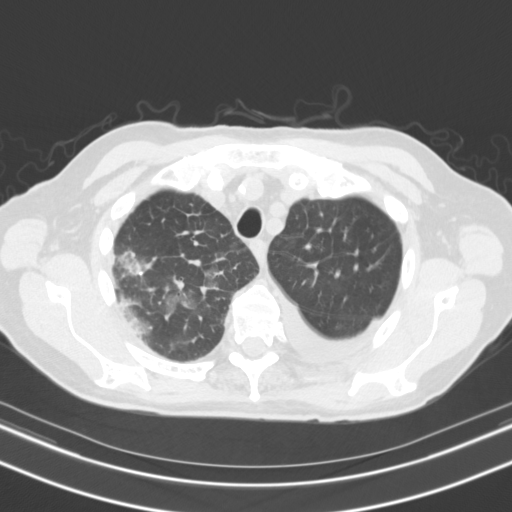
[im 138/162  mediastinal]
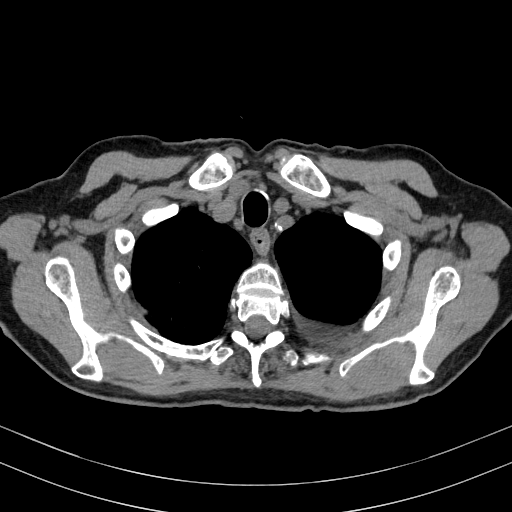
[im 138/162  lung]
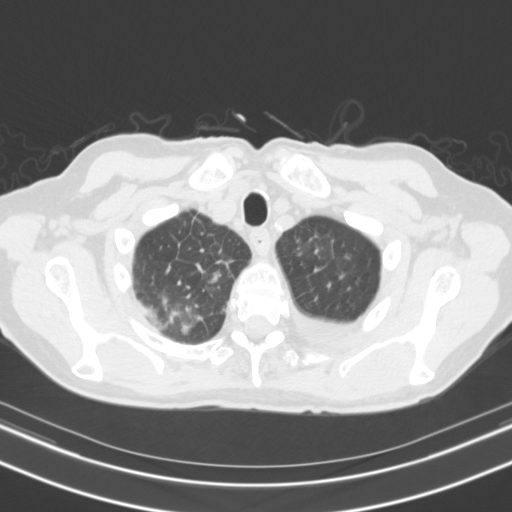
[im 150/162  lung]
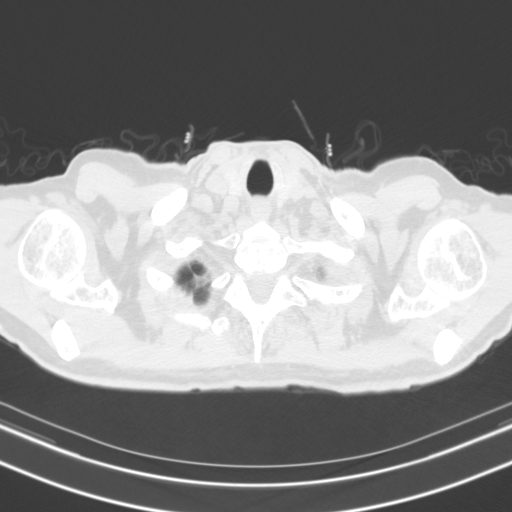

[14 of 34 positions shown; findings below may reference images not displayed]

FINDINGS: Cardiovascular: Top normal heart size. No significant pericardial
effusion/thickening. Three-vessel coronary atherosclerosis.
Atherosclerotic nonaneurysmal thoracic aorta. Top-normal caliber
main pulmonary artery (3.0 cm diameter).

Mediastinum/Nodes: No discrete thyroid nodules. Unremarkable
esophagus. No axillary adenopathy. Mildly enlarged 1.0 cm right
paratracheal node (series 2/image 64). Mildly enlarged 1.0 cm
subcarinal node (series 2/image 71). No discrete hilar adenopathy on
these noncontrast images.

Lungs/Pleura: No pneumothorax. Small dependent bilateral pleural
effusions. Extensive interlobular septal thickening throughout both
lungs involving all lung lobes. Widespread moderate patchy regions
of ground-glass opacity and consolidation, predominantly in the
peripheral lungs, also involving all lung lobes, with some areas of
crazy paving opacity, for example in the posterior right middle lobe
(series 5/image 103). Findings are most prominent in the lower lungs
bilaterally. Findings are new at the lung bases since [DATE] CT
abdomen/pelvis study. No discrete lung masses or significant
pulmonary nodules. No significant regions of bronchiectasis.

Upper abdomen: No acute abnormality.

Musculoskeletal: No aggressive appearing focal osseous lesions. Mild
thoracic spondylosis.
IMPRESSION: 1. Diffuse interlobular septal thickening and widespread moderate
patchy peripheral regions of ground-glass opacity and consolidation
with areas of crazy paving opacity. Top-normal heart size. Small
bilateral pleural effusions. Differential is broad and includes
pulmonary edema, atypical infection, cryptogenic organizing
pneumonia, amyloidosis and less likely neoplastic causes such as
lymphoproliferative disorder or adenocarcinoma. Pulmonology
consultation suggested. Close chest CT follow-up recommended at a
minimum in 3 months.
2. Mild mediastinal lymphadenopathy, nonspecific.
3. Three-vessel coronary atherosclerosis.
4. Aortic Atherosclerosis ([HJ]-[HJ]).

## 2020-04-07 DIAGNOSIS — R059 Cough, unspecified: Secondary | ICD-10-CM | POA: Diagnosis not present

## 2020-04-07 DIAGNOSIS — R600 Localized edema: Secondary | ICD-10-CM | POA: Diagnosis not present

## 2020-04-30 ENCOUNTER — Encounter: Payer: Self-pay | Admitting: Pulmonary Disease

## 2020-04-30 ENCOUNTER — Telehealth: Payer: Self-pay | Admitting: Pulmonary Disease

## 2020-04-30 ENCOUNTER — Telehealth: Payer: Self-pay | Admitting: Cardiovascular Disease

## 2020-04-30 ENCOUNTER — Other Ambulatory Visit: Payer: Self-pay

## 2020-04-30 ENCOUNTER — Ambulatory Visit: Payer: PPO | Admitting: Pulmonary Disease

## 2020-04-30 VITALS — BP 124/72 | HR 85 | Temp 97.9°F | Ht 66.0 in | Wt 145.8 lb

## 2020-04-30 DIAGNOSIS — R053 Chronic cough: Secondary | ICD-10-CM

## 2020-04-30 DIAGNOSIS — J4520 Mild intermittent asthma, uncomplicated: Secondary | ICD-10-CM

## 2020-04-30 MED ORDER — MONTELUKAST SODIUM 10 MG PO TABS
10.0000 mg | ORAL_TABLET | Freq: Every day | ORAL | 11 refills | Status: DC
Start: 2020-04-30 — End: 2020-07-02

## 2020-04-30 MED ORDER — BREO ELLIPTA 200-25 MCG/INH IN AEPB: 1.0000 | INHALATION_SPRAY | Freq: Every day | RESPIRATORY_TRACT | 11 refills | Status: DC

## 2020-04-30 NOTE — Patient Instructions (Signed)
Nice to meet you  I worry a major driver of your cough is volume overload given your CT scan appears most consistent with pulmonary edema or fluid in the lungs as well as fluid around the lungs.  Given your allergy symptoms, I think we should be aggressive in trying to treat a component of the cough that related to asthma.  Use Breo 1 puff daily.  Rinse your mouth out after every use.  The computer says the co-pay should be around $15, this is actually quite good.  If it ends up being too expensive, please let me know and we can try a different but similar medication.  For the allergy symptoms, continue taking your antihistamines.  I prescribed a new medicine, montelukast, take 1 pill at bedtime.  We may need to repeat a CT scan in the coming months depending on how you respond to treatment.  I will send a message to Dr. Gery Pray for follow-up with the heart given concern for fluid backup.  Please contact his office tomorrow to ask for an appointment.  Return to clinic in 3 months for follow-up with Dr. Judeth Horn

## 2020-04-30 NOTE — Progress Notes (Signed)
@Patient  ID: , male    DOB: 10/13/1940, 80 y.o.   MRN: [id number]  Chief Complaint  Patient presents with  . Consult    Referred by PCP for cough and abnormal CT scan. States he has had the cough for the past 3 months. Described as a non productive cough. Denies any SOB with cough.     Referring provider: [id number], DO  HPI:   80 year old whom we are seeing for evaluation of chronic cough.  PCP note reviewed.  Most recent cardiology note fall 2021 reviewed.  Patient reports 47-month history of cough.  Is nonproductive.  It is moderate to severe.  Happens throughout the day.  No clear timing during there things are better or worse.  Sometimes worse at night after further questioning.  No environmental changes to explain cough.  Some concern related to Covid vaccines.  Supposedly has been treated with pneumonia multiple times.  Denies any fever or productive cough suggestive of bacterial pneumonia.  Was prescribed albuterol but this caused what sounds like near anaphylactic symptoms.  He has subsequently appropriately discarded his medication.  No positional cough is better or worse.  No alleviating or exacerbating factors.  He has seasonal allergies.  Has had worsening nasal congestion over the last few months.  Feels like there is some postnasal drip symptoms.  Takes 2 antihistamines, one the morning, 1 at night.  He denies any heartburn or reflux symptoms.  He is taking a daily PPI.  He has had multiple chest x-rays at urgent cares etc.  Been told he has pneumonia and treated.  Again, symptoms not consistent with bacterial pneumonia.  PCP ordered CT chest that was obtained 03/2020 that on my interpretation reveals bilateral pleural effusions, interlobular septal thickening bilaterally, bilateral scattered groundglass opacities most consistent with volume overload and pulmonary edema.  PMH: Hypertension, asthma, diabetes, hyperlipidemia, allergies Surgical history: Reviewed  with patient, he denies any surgeries Family history: He denies respiratory illnesses in first-degree relatives, less than the past male history Social history: Former smoker, quit early 1970s, lives with wife in 10-01-1997 / Pulmonary Flowsheets:   ACT:  No flowsheet data found.  MMRC: No flowsheet data found.  Epworth:  No flowsheet data found.  Tests:   FENO:  No results found for: NITRICOXIDE  PFT: No flowsheet data found.  WALK:  No flowsheet data found.  Imaging: Personally reviewed and as per EMR and discussion in this note  Lab Results: Personally reviewed CBC    Component Value Date/Time   WBC 6.3 05/09/2013 0340   RBC 3.80 (L) 05/09/2013 0340   HGB 12.8 (L) 05/09/2013 0340   HCT 35.6 (L) 05/09/2013 0340   PLT 185 05/09/2013 0340   MCV 93.7 05/09/2013 0340   MCH 33.7 05/09/2013 0340   MCHC 36.0 05/09/2013 0340   RDW 13.4 05/09/2013 0340    BMET    Component Value Date/Time   NA 139 08/09/2019 1155   K 3.9 08/09/2019 1155   CL 107 08/09/2019 1155   CO2 26 08/09/2019 1155   GLUCOSE 151 (H) 08/09/2019 1155   BUN 10 08/09/2019 1155   CREATININE 0.89 08/09/2019 1155   CALCIUM 9.1 08/09/2019 1155   GFRNONAA >60 08/09/2019 1155   GFRAA >60 08/09/2019 1155    BNP No results found for: BNP  ProBNP No results found for: PROBNP  Specialty Problems   None     Allergies  Allergen Reactions  . Meloxicam Itching, Palpitations and  Other (See Comments)    Headaches, Mood swings.  . Morphine And Related Nausea And Vomiting  . Statins Other (See Comments)    Severe arthritic response  . Cholestoff [Plant Sterols And Stanols] Other (See Comments)    Muscle aches  . Nsaids Other (See Comments)    Aleve ,  Myalgia  . Atorvastatin     Severe myalgias  . Cortisone Hypertension    Patient states cortisone injections he has received for his knees cause hypertension and hyperglycemia  . Darvon [Propoxyphene] Other (See Comments)     hallucinate  . Glimepiride Itching  . Indomethacin     Neck pain flare  . Penicillins     *positive allergy test*  . Pravastatin     myalgias  . Sulfa Antibiotics Nausea And Vomiting  . Tramadol     Immunization History  Administered Date(s) Administered  . Influenza, High Dose Seasonal PF 10/19/2016, 10/11/2018  . Influenza, Quadrivalent, Recombinant, Inj, Pf 10/17/2019  . Moderna Sars-Covid-2 Vaccination 03/25/2019, 04/15/2019    Past Medical History:  Diagnosis Date  . Arthritis   . Asthma    only has flareup with smelly perfume   . BPH (benign prostatic hyperplasia)   . Carotid artery occlusion    right ICA 50% stenosis  . Carotodynia   . Cataract    immature to the right eye  . Diabetes mellitus without complication (HCC)    borderline  . GERD (gastroesophageal reflux disease)    takes Omeprazole daily  . Headache(784.0)    r/t arthritis in neck   . Hemorrhoids   . History of bronchitis    last time 10-20yrs ago  . History of colon polyps   . Hyperlipidemia    no meds since Jan 1  . Hypertension    takes Verapamil,Lisinopril,and Cardura daily  . Joint pain   . Legally blind in left eye, as defined in [place]    hit by a baseball  . Low back pain    reason unknown  . Prolapsed hemorrhoids   . Urinary frequency    sees Dr.Tannenbaum    Tobacco History: Social History   Tobacco Use  Smoking Status Never Smoker  Smokeless Tobacco Never Used   Counseling given: Not Answered   Continue to not smoke  Outpatient Encounter Medications as of 04/30/2020  Medication Sig  . aspirin EC 81 MG tablet Take 81 mg by mouth every morning.  Marland Kitchen doxazosin (CARDURA) 8 MG tablet Take 8 mg by mouth 2 (two) times daily. Takes 1/2 tablet  . fluticasone furoate-vilanterol (BREO ELLIPTA) 200-25 MCG/INH AEPB Inhale 1 puff into the lungs daily.  Marland Kitchen KRILL OIL PO Take 1 capsule by mouth 2 (two) times daily.   Marland Kitchen loratadine (CLARITIN) 10 MG tablet Take 10 mg by mouth daily.  Marland Kitchen  losartan (COZAAR) 100 MG tablet Take 100 mg by mouth daily.  Marland Kitchen lovastatin (MEVACOR) 20 MG tablet Take 20 mg by mouth at bedtime.  . metFORMIN HCl ER 500 MG/5ML SRER   . omeprazole (PRILOSEC) 20 MG capsule Take 20 mg by mouth every morning.  Letta Pate VERIO test strip 2 (two) times daily.  . verapamil (CALAN) 120 MG tablet Take 120 mg by mouth at bedtime.   . [DISCONTINUED] acetaminophen (TYLENOL) 650 MG CR tablet Take 1,300 mg by mouth every 8 (eight) hours as needed for pain.  . [DISCONTINUED] cetirizine (ZYRTEC) 10 MG tablet Take 10 mg by mouth daily.  . [DISCONTINUED] SHINGRIX injection   . [  DISCONTINUED] terbinafine (LAMISIL) 250 MG tablet    No facility-administered encounter medications on file as of 04/30/2020.     Review of Systems  Review of Systems  No chest pain with exertion.  No orthopnea or PND.  Did describe lower extremity swelling recently that got better after taking a few doses of hydrochlorothiazide.  Comprehensive review of systems otherwise negative.  Physical Exam  BP 124/72   Pulse 85   Temp 97.9 F (36.6 C) (Temporal)   Ht 5\' 6"  (1.676 m)   Wt 145 lb 12.8 oz (66.1 kg)   SpO2 98% Comment: on RA  BMI 23.53 kg/m   Wt Readings from Last 5 Encounters:  04/30/20 145 lb 12.8 oz (66.1 kg)  11/12/19 144 lb 3.2 oz (65.4 kg)  10/30/19 145 lb 11.2 oz (66.1 kg)  08/13/19 152 lb 5.4 oz (69.1 kg)  11/09/18 159 lb 6.4 oz (72.3 kg)    BMI Readings from Last 5 Encounters:  04/30/20 23.53 kg/m  11/12/19 23.27 kg/m  10/30/19 23.52 kg/m  08/13/19 24.59 kg/m  11/09/18 25.73 kg/m     Physical Exam General: Well-appearing, no acute distress Eyes: EOMI, no icterus Neck: Supple, no JVD appreciated Pulmonary: Coarse crackles in lower to mid lung fields, otherwise clear, normal work of breathing on room air Cardiovascular: Regular rate and rhythm, no murmur Abdomen:, Bowel sounds present MSK: No synovitis, no joint effusion Neuro: Normal gait, no  weakness Psych: Normal mood, full affect   Assessment & Plan:   Chronic cough: Present for 3 months.  Dry.  Possibly multifactorial.  No atopic symptoms, ongoing rhinitis.  Treatment for asthma as below.  CT scan obtained 03/2020 was consistent with volume overload given interlobular septal thickening, groundglass opacities, bilateral pleural effusions.  Will contact cardiologist for follow-up, consideration of Lasix, additional diagnostic tests.  Will consider repeat CT in the coming months although it appears classic for pulmonary edema, volume overload.  Asthma: Atopic symptoms, cough.  No dyspnea on exertion.  Trial of Breo 1 puff daily.  Add montelukast 1 pill at nighttime as well given nasal symptoms.   Return in about 3 months (around 07/31/2020).   08/02/2020, MD 04/30/2020

## 2020-04-30 NOTE — Telephone Encounter (Signed)
Patient wanted to let Dr. Allyson Sabal know that he saw his Pulmonologist today, Dr. Judeth Horn, and he was told to f/u with Dr. Allyson Sabal for fluid in and outside of his lungs. Scheduled pt for 05/13/20 at 3:30pm with Dr. Allyson Sabal but wanted to make him aware of the situation.

## 2020-04-30 NOTE — Telephone Encounter (Signed)
**Note De-Identified David Sellers Obfuscation** Spoke with the pt  His montelukast was not sent  Per AVS from today's visit, this was supposed to have been sent  I verified pharm and rx sent  Nothing further needed

## 2020-05-05 ENCOUNTER — Telehealth: Payer: Self-pay | Admitting: Pulmonary Disease

## 2020-05-05 MED ORDER — NYSTATIN 100000 UNIT/ML MT SUSP
5.0000 mL | Freq: Four times a day (QID) | OROMUCOSAL | 0 refills | Status: DC
Start: 2020-05-05 — End: 2020-05-13

## 2020-05-05 MED ORDER — ADVAIR HFA 115-21 MCG/ACT IN AERO
2.0000 | INHALATION_SPRAY | Freq: Two times a day (BID) | RESPIRATORY_TRACT | 12 refills | Status: DC
Start: 2020-05-05 — End: 2020-05-27

## 2020-05-05 NOTE — Telephone Encounter (Signed)
Pt states MH gave pt Breo prescription. Pt states he noticed last night he was having difficulty swallowing pills, pt states there are "white spots" on the roof of his mouth and tongue and thought it looked swollen. Pt also states he feels like he is coughing more instead of less. Also MH wanted him to see the cardiologist and he has an appt next week.  Please advise [phone]

## 2020-05-05 NOTE — Telephone Encounter (Signed)
Stop Breo. I will send in an alternative ICS/LABA that is not a powder. I will also send in nystatin for suspected thrush.  Cc: Dr. Judeth Horn

## 2020-05-05 NOTE — Telephone Encounter (Signed)
Spoke with the pt  He states that since starting Breo 04/30/20 he has developed white coating on tongue and white spots of the roof of mouth  He does rinse after each use  He took some tylenol last night and had trouble swallowing it and this is unusual for him  Mouth is not painful but "feels funny"  Feels like Virgel Bouquet has caused the cough to be worse- non prod  No increased SOB  Please advise thanks

## 2020-05-05 NOTE — Telephone Encounter (Signed)
Called and spoke with Patient.  Beth, NP recommendations given.  Understanding stated.  Nothing further at this time. 

## 2020-05-08 ENCOUNTER — Telehealth: Payer: Self-pay | Admitting: Pulmonary Disease

## 2020-05-08 MED ORDER — BENZONATATE 200 MG PO CAPS
200.0000 mg | ORAL_CAPSULE | Freq: Three times a day (TID) | ORAL | 1 refills | Status: DC | PRN
Start: 2020-05-08 — End: 2020-05-27

## 2020-05-08 NOTE — Telephone Encounter (Signed)
Sorry to hear that you are not feeling better.  Can try Delsym 2 teaspoons twice daily for cough as needed Tessalon 200 mg 3 times daily as needed #30 with 1 refill  Continue to give it a little more time since his only been 1 week since you were here.   Will send to Dr. Judeth Horn as well for any further recommendations  If not doing better may need a sooner follow-up  Please contact office for sooner follow up if symptoms do not improve or worsen or seek emergency care

## 2020-05-08 NOTE — Telephone Encounter (Signed)
Primary Pulmonologist: Hunsucker Last office visit and with whom: 04/30/20 with Hunsucker What do we see them for (pulmonary problems): chronic cough Last OV assessment/plan: Patient Instructions by Karren Burly, MD at 04/30/2020 2:30 PM  Author: Karren Burly, MD Author Type: Physician Filed: 04/30/2020 2:56 PM  Note Status: Signed Cosign: Cosign Not Required Encounter Date: 04/30/2020  Editor: Karren Burly, MD (Physician)               Nice to meet you  I worry a major driver of your cough is volume overload given your CT scan appears most consistent with pulmonary edema or fluid in the lungs as well as fluid around the lungs.  Given your allergy symptoms, I think we should be aggressive in trying to treat a component of the cough that related to asthma.  Use Breo 1 puff daily.  Rinse your mouth out after every use.  The computer says the co-pay should be around $15, this is actually quite good.  If it ends up being too expensive, please let me know and we can try a different but similar medication.  For the allergy symptoms, continue taking your antihistamines.  I prescribed a new medicine, montelukast, take 1 pill at bedtime.  We may need to repeat a CT scan in the coming months depending on how you respond to treatment.  I will send a message to Dr. Gery Pray for follow-up with the heart given concern for fluid backup.  Please contact his office tomorrow to ask for an appointment.  Return to clinic in 3 months for follow-up with Dr. Judeth Horn        Was appointment offered to patient (explain)?  Pt wants recommendations   Reason for call: Called and spoke with pt who states the white spots on his tongue have improved after switching inhalers but he states that the cough has not improved and may have even become worse. Pt said that the wheezing has also gotten worse.  Pt's spouse states that the cough sounds like a congested cough.  Cough is worse  throughout the day.  Pt has been taking mucinex DM twice daily and also has been taking delsym cough syrup but states after using the delsym, he begins to cough more. Pt is also taking singulair, zyrtec, and claritin and is also using the Advair inhaler twice daily as prescribed.  Pt denies any complaints of fever, states that he has not checked his temp recently but does not feel feverish.  Pt has not done a covid test recently.  Pt said that he now has had this cough x3 months and wants to know if anything could be recommended to help. Due to Dr. Judeth Horn being out of the office today, sending to provider of the day. Tammy, please advise.   Allergies  Allergen Reactions  . Meloxicam Itching, Palpitations and Other (See Comments)    Headaches, Mood swings.  . Morphine And Related Nausea And Vomiting  . Statins Other (See Comments)    Severe arthritic response  . Cholestoff [Plant Sterols And Stanols] Other (See Comments)    Muscle aches  . Nsaids Other (See Comments)    Aleve ,  Myalgia  . Atorvastatin     Severe myalgias  . Cortisone Hypertension    Patient states cortisone injections he has received for his knees cause hypertension and hyperglycemia  . Darvon [Propoxyphene] Other (See Comments)    hallucinate  . Glimepiride Itching  . Indomethacin  Neck pain flare  . Penicillins     *positive allergy test*  . Pravastatin     myalgias  . Sulfa Antibiotics Nausea And Vomiting  . Tramadol     Immunization History  Administered Date(s) Administered  . Influenza, High Dose Seasonal PF 10/19/2016, 10/11/2018  . Influenza, Quadrivalent, Recombinant, Inj, Pf 10/17/2019  . Moderna Sars-Covid-2 Vaccination 03/25/2019, 04/15/2019

## 2020-05-08 NOTE — Telephone Encounter (Signed)
Called and spoke with pt letting him know the recs stated by TP and stated to him that we were going to send Rx for tessalon to pharmacy for him to see if it would help. Pt verbalized understanding. Verified preferred pharmacy and sent Rx in for pt. Nothing further needed.

## 2020-05-13 ENCOUNTER — Encounter: Payer: Self-pay | Admitting: Cardiovascular Disease

## 2020-05-13 ENCOUNTER — Other Ambulatory Visit: Payer: Self-pay

## 2020-05-13 ENCOUNTER — Telehealth: Payer: Self-pay | Admitting: Pulmonary Disease

## 2020-05-13 ENCOUNTER — Ambulatory Visit: Payer: PPO | Admitting: Cardiovascular Disease

## 2020-05-13 VITALS — BP 130/78 | HR 88 | Ht 66.0 in | Wt 148.0 lb

## 2020-05-13 DIAGNOSIS — E782 Mixed hyperlipidemia: Secondary | ICD-10-CM | POA: Diagnosis not present

## 2020-05-13 DIAGNOSIS — Z5181 Encounter for therapeutic drug level monitoring: Secondary | ICD-10-CM

## 2020-05-13 DIAGNOSIS — I10 Essential (primary) hypertension: Secondary | ICD-10-CM | POA: Diagnosis not present

## 2020-05-13 NOTE — Assessment & Plan Note (Signed)
History of essential hypertension a blood pressure measured today 130/78.  He is on losartan and verapamil.

## 2020-05-13 NOTE — Assessment & Plan Note (Signed)
History of carotid artery disease status post elective carotid endarterectomy performed by Dr. Darrick Penna back in 2015.  He did have some left facial numbness after the procedure which for the most part resolved.  Dr. Darrick Penna follows follows his Dopplers on annual basis.

## 2020-05-13 NOTE — Progress Notes (Signed)
05/13/2020 David Sellers   [birth year]  [id number]  Primary Physician Irena Reichmann, DO Primary Cardiologist: Runell Gess MD Nicholes Calam[place], [place]  HPI:  David Sellers is a 80 y.o.  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 Fabienne Bruns for cardiovascular evaluation and preoperative screening prior to elective left carotid endarterectomy. I last saw him in the office 11/12/2019.Marland Kitchen His cardiovascular factor profile is positive for hypertension, dyslipidemia, and family history of heart disease with a father who died of a myocardial infarction in his 22s. He has never had a heart attack or stroke. He denies chest pain or shortness of breath. He did have pain in his left neck and had incidentally found moderate left internal carotid artery stenosis. A Myoview stress test performed for preoperative clearance 04/23/13 was entirely normal. He underwent uncomplicated left carotid endarterectomy by Dr. Darrick Penna on 05/08/13 with excellent operative result although the patient does have some left facial numbness as result. SinceI saw him a year ago he has remained stable.  He is been under a lot of stress lately with his wife having kidney infection him having upper respiratory tract infection although he checks his blood pressure at home and it runs in the 135/70 range.  He has been diagnosed with diabetes and is seeing an endocrinologist.  He is placed on a diet is lost 15 pounds.   Since I saw him a year ago he has taken 3 Covid shots and developed pneumonia after these.  He has had a chronic cough for months now followed by pulmonologist.  A chest CT performed 03/28/2020 showed small bilateral pleural effusions, three-vessel coronary atherosclerosis with widespread moderate patchy regions of groundglass opacity and consolidation involving all lung lobes.  Current Meds  Medication Sig  . aspirin EC 81 MG tablet Take 81 mg by mouth  every morning.  . benzonatate (TESSALON) 200 MG capsule Take 1 capsule (200 mg total) by mouth 3 (three) times daily as needed for cough.  . doxazosin (CARDURA) 8 MG tablet Take 8 mg by mouth 2 (two) times daily. Takes 1/2 tablet  . fluticasone-salmeterol (ADVAIR HFA) 115-21 MCG/ACT inhaler Inhale 2 puffs into the lungs 2 (two) times daily.  Marland Kitchen KRILL OIL PO Take 1 capsule by mouth 2 (two) times daily.   Marland Kitchen loratadine (CLARITIN) 10 MG tablet Take 10 mg by mouth daily.  Marland Kitchen losartan (COZAAR) 100 MG tablet Take 100 mg by mouth daily.  Marland Kitchen lovastatin (MEVACOR) 20 MG tablet Take 20 mg by mouth at bedtime.  . metFORMIN HCl ER 500 MG/5ML SRER   . montelukast (SINGULAIR) 10 MG tablet Take 1 tablet (10 mg total) by mouth at bedtime.  Marland Kitchen omeprazole (PRILOSEC) 20 MG capsule Take 20 mg by mouth every morning.  Letta Pate VERIO test strip 2 (two) times daily.  . verapamil (CALAN) 120 MG tablet Take 120 mg by mouth at bedtime.      Allergies  Allergen Reactions  . Meloxicam Itching, Palpitations and Other (See Comments)    Headaches, Mood swings.  . Morphine And Related Nausea And Vomiting  . Statins Other (See Comments)    Severe arthritic response  . Cholestoff [Plant Sterols And Stanols] Other (See Comments)    Muscle aches  . Nsaids Other (See Comments)    Aleve ,  Myalgia  . Atorvastatin     Severe myalgias  . Cortisone Hypertension    Patient states cortisone  injections he has received for his knees cause hypertension and hyperglycemia  . Darvon [Propoxyphene] Other (See Comments)    hallucinate  . Doxycycline     Other reaction(s): Coughing  . Glimepiride Itching  . Hydrochlorothiazide     Other reaction(s): Low Blood Pressure  . Indomethacin     Neck pain flare  . Montelukast     Other reaction(s): Hallucinate  . Other     Other reaction(s): High Blood Pressure Other reaction(s): Hallucinate  . Penicillins     *positive allergy test*  . Pravastatin     myalgias  . Sulfa Antibiotics  Nausea And Vomiting  . Terbinafine     Other reaction(s): diarrhea  . Tramadol     Social History   Socioeconomic History  . Marital status: Married    Spouse name: Not on file  . Number of children: Not on file  . Years of education: Not on file  . Highest education level: Not on file  Occupational History  . Not on file  Tobacco Use  . Smoking status: Never Smoker  . Smokeless tobacco: Never Used  Vaping Use  . Vaping Use: Never used  Substance and Sexual Activity  . Alcohol use: No  . Drug use: No  . Sexual activity: Yes  Other Topics Concern  . Not on file  Social History Narrative  . Not on file   Social Determinants of Health   Financial Resource Strain: Not on file  Food Insecurity: Not on file  Transportation Needs: Not on file  Physical Activity: Not on file  Stress: Not on file  Social Connections: Not on file  Intimate Partner Violence: Not on file     Review of Systems: General: negative for chills, fever, night sweats or weight changes.  Cardiovascular: negative for chest pain, dyspnea on exertion, edema, orthopnea, palpitations, paroxysmal nocturnal dyspnea or shortness of breath Dermatological: negative for rash Respiratory: negative for cough or wheezing Urologic: negative for hematuria Abdominal: negative for nausea, vomiting, diarrhea, bright red blood per rectum, melena, or hematemesis Neurologic: negative for visual changes, syncope, or dizziness All other systems reviewed and are otherwise negative except as noted above.    Blood pressure 130/78, pulse 88, height 5\' 6"  (1.676 m), weight 148 lb (67.1 kg), SpO2 97 %.  General appearance: alert and no distress Neck: no adenopathy, no carotid bruit, no JVD, supple, symmetrical, trachea midline and thyroid not enlarged, symmetric, no tenderness/mass/nodules Lungs: clear to auscultation bilaterally Heart: regular rate and rhythm, S1, S2 normal, no murmur, click, rub or gallop Extremities:  extremities normal, atraumatic, no cyanosis or edema Pulses: 2+ and symmetric Skin: Skin color, texture, turgor normal. No rashes or lesions Neurologic: Alert and oriented X 3, normal strength and tone. Normal symmetric reflexes. Normal coordination and gait  EKG sinus rhythm at 88 with nonspecific ST and T wave changes.  Personally reviewed this EKG.  ASSESSMENT AND PLAN:   Occlusion and stenosis of carotid artery without mention of cerebral infarction History of carotid artery disease status post elective carotid endarterectomy performed by Dr. back in 2015.  He did have some left facial numbness after the procedure which for the most part resolved.  Dr. 2016 follows follows his Dopplers on annual basis.  Essential hypertension History of essential hypertension a blood pressure measured today 130/78.  He is on losartan and verapamil.  Hyperlipidemia History of hyperlipidemia on statin therapy with lipid profile performed 02/18/2020 revealing a total cholesterol of 156, LDL 79 HDL  53.      Runell Gess MD FACP,FACC,FAHA, [facility] 05/13/2020 3:53 PM

## 2020-05-13 NOTE — Patient Instructions (Signed)
Medication Instructions:  Your physician recommends that you continue on your current medications as directed. Please refer to the Current Medication list given to you today.  *If you need a refill on your cardiac medications before your next appointment, please call your pharmacy*   Testing/Procedures: Your physician has requested that you have an echocardiogram. Echocardiography is a painless test that uses sound waves to create images of your heart. It provides your doctor with information about the size and shape of your heart and how well your heart's chambers and valves are working. This procedure takes approximately one hour. There are no restrictions for this procedure. This procedure is done at [address] Floor    Follow-Up: At [facility], you and your health needs are our priority.  As part of our continuing mission to provide you with exceptional heart care, we have created designated Provider Care Teams.  These Care Teams include your primary Cardiologist (physician) and Advanced Practice Providers (APPs -  Physician Assistants and Nurse Practitioners) who all work together to provide you with the care you need, when you need it.  We recommend signing up for the patient portal called "MyChart".  Sign up information is provided on this After Visit Summary.  MyChart is used to connect with patients for Virtual Visits (Telemedicine).  Patients are able to view lab/test results, encounter notes, upcoming appointments, etc.  Non-urgent messages can be sent to your provider as well.   To learn more about what you can do with MyChart, go to [email or web address].    Your next appointment:   12 month(s)  The format for your next appointment:   In Person  Provider:   Jonathan Berry, MD 

## 2020-05-13 NOTE — Telephone Encounter (Signed)
Attempted to call pt but line went straight to VM. Left message for him to return call. 

## 2020-05-13 NOTE — Telephone Encounter (Signed)
pt is calling because Dr. Judeth Horn thought that pt cough was from fluid.. pt went to cardiologist today & Dr. Allyson Sabal doesnt believe his cough is from fluid.  he is wondering if something could be called in for his cough or should he set up another OV to see him again . Pt uses [facility] [address] in [place] .Please advise   [phone]

## 2020-05-13 NOTE — Assessment & Plan Note (Signed)
History of hyperlipidemia on statin therapy with lipid profile performed 02/18/2020 revealing a total cholesterol of 156, LDL 79 HDL 53.

## 2020-05-14 MED ORDER — FUROSEMIDE 20 MG PO TABS: 20.0000 mg | ORAL_TABLET | Freq: Every day | ORAL | 0 refills | Status: DC

## 2020-05-14 NOTE — Telephone Encounter (Signed)
Pt is returning phone call states that he is wanting to get a prescription for Lasix to help with the fluid build up. Pls regard [phone].

## 2020-05-14 NOTE — Telephone Encounter (Signed)
Called and spoke with patient. He stated that he had an OV with Dr. Allyson Sabal yesterday in regards to the fluid around his lungs. Per patient, Dr. Allyson Sabal said he will not treat him for this fluid since it has nothing to do with his heart.   He wants MH to take a look at Dr. Hazle Coca note from yesterday and see if he is willing to prescribe Lasix to help with the fluid.   [facility] [place].   MH, please advise. Thanks.

## 2020-05-14 NOTE — Telephone Encounter (Signed)
Called and spoke with patient. He verbalized understanding. I did offer to fax the lab request to his PCP, closer to home, but he wants to come to our office. He will stop by next week for blood work. Lasix has been sent to his pharmacy.   He did tell him that he has been trying to use the Advair inhaler but it actually made his cough worse. He has spent about $150 on inhalers that haven't helped. He stated that he wants to D/C the inhalers at this time and see if the Lasix will help with the cough. I advised him I would let MH know.   Nothing further needed at time of call.

## 2020-05-14 NOTE — Telephone Encounter (Signed)
Called patient, call went straight to VM. Left message for patient to call back.

## 2020-05-14 NOTE — Telephone Encounter (Signed)
I would like to start lasix 20 mg daily. Can he come in and get baseline BMP as well as a repeat BMP next week while taking lasix? I want to keep a close eye on his electrolytes, kidney function.

## 2020-05-21 ENCOUNTER — Other Ambulatory Visit (INDEPENDENT_AMBULATORY_CARE_PROVIDER_SITE_OTHER): Payer: PPO

## 2020-05-21 DIAGNOSIS — Z5181 Encounter for therapeutic drug level monitoring: Secondary | ICD-10-CM | POA: Diagnosis not present

## 2020-05-21 LAB — BASIC METABOLIC PANEL
BUN: 7 mg/dL (ref 6–23)
CO2: 29 mEq/L (ref 19–32)
Calcium: 9.1 mg/dL (ref 8.4–10.5)
Chloride: 103 mEq/L (ref 96–112)
Creatinine, Ser: 1 mg/dL (ref 0.40–1.50)
GFR: 71.01 mL/min (ref >60.00)
Glucose, Bld: 166 mg/dL — ABNORMAL HIGH (ref 70–99)
Potassium: 3.7 mEq/L (ref 3.5–5.1)
Sodium: 140 mEq/L (ref 135–145)

## 2020-05-27 ENCOUNTER — Telehealth: Payer: Self-pay | Admitting: Pulmonary Disease

## 2020-05-27 DIAGNOSIS — R059 Cough, unspecified: Secondary | ICD-10-CM

## 2020-05-27 MED ORDER — ALBUTEROL SULFATE HFA 108 (90 BASE) MCG/ACT IN AERS: 2.0000 | INHALATION_SPRAY | Freq: Four times a day (QID) | RESPIRATORY_TRACT | 6 refills | Status: DC | PRN

## 2020-05-27 NOTE — Telephone Encounter (Signed)
Ok thank you. Tough spot. Sounds like inhalers have not been effective and caused more side effects than have helped. Let's give it some more time and see. Suspect related to the pollen and may get better with time.

## 2020-05-27 NOTE — Telephone Encounter (Signed)
Called and spoke to pt. Pt still having a hard time with his cough (see phone note from 4/6 for more info). Pt c/o prod cough with thick clear mucus, hoarseness and wheezing. Pt states since he has started his Lasix his cough has changed, its not as harsh but is more frequent. Pt states his cough is only helped with cough drops. Pt states he was in the car for 3-4 hours yesterday and the cough was much worse yesterday than today. Pt states his home is being rid of mold today. Pt is taking mucinex DM. Pt states the tessalon he was previously prescribed did not help at all. Pt denies SOB, f/c/s, swelling, chest tightness. Pt's spo2 is 100% on RA. Pt is requesting recs today.   Dr. Judeth Horn please advise. Thanks.

## 2020-05-27 NOTE — Telephone Encounter (Signed)
Sounds like worsening symptoms due to allergies, mucous production. Hos not tolerated ICS/LABA via Breo, montelukast on allergy list. Recommend flonase 1 spray each nostril twice a day for 1 week then 1 spray each nostril each day thereafter and Zyrtec or other anti-histamine daily. These can be obtained over the counter. If he desires a prescription I can send. In addition, will send albuterol inhaler 2 puffs every 6 hours for wheezing.

## 2020-05-27 NOTE — Telephone Encounter (Signed)
Pt stated that he is halfway through his fluid pills but he still has a very persistent cough and he is wanting to get advise from Dr. Judeth Horn as to what he can do and he stated that he is coughing up clear mucus states that he has to go to the bathroom and rinse his mouth out and he said that it will be long strings that can last for up to 5 minutes. Pls regard [phone]

## 2020-05-27 NOTE — Telephone Encounter (Signed)
Attempted to call pt but line went directly to voicemail. Left message for him to return call.

## 2020-05-27 NOTE — Telephone Encounter (Signed)
Patient is aware of below recommendations and voiced his understanding.  °Nothing further needed at this time.  ° °

## 2020-05-27 NOTE — Telephone Encounter (Signed)
I called and spoke with patient in regards to Dr. Iantha Fallen. Patient stated that is he taking Singular and is not allergic to it and he does take Zyrtec and Claritin daily. He has tried flonase in the past with no help. 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 I did not see this in his allergy chart so went ahead and added it and removed Singular as an allergy as well. Patient did state that he is feeling better today, still coughing but no mucous production or wheezing.i tolld patient I would notify Dr. Judeth Horn all this call back with any recs. Patient verbalized understanding.  Dr. Judeth Horn, please advise with any new recs. Thanks!

## 2020-06-12 DIAGNOSIS — M546 Pain in thoracic spine: Secondary | ICD-10-CM | POA: Diagnosis not present

## 2020-06-12 DIAGNOSIS — M545 Low back pain, unspecified: Secondary | ICD-10-CM | POA: Diagnosis not present

## 2020-06-12 DIAGNOSIS — M5459 Other low back pain: Secondary | ICD-10-CM | POA: Diagnosis not present

## 2020-06-18 ENCOUNTER — Other Ambulatory Visit: Payer: Self-pay | Admitting: Family Medicine

## 2020-06-18 DIAGNOSIS — S32000A Wedge compression fracture of unspecified lumbar vertebra, initial encounter for closed fracture: Secondary | ICD-10-CM

## 2020-06-24 ENCOUNTER — Other Ambulatory Visit: Payer: Self-pay

## 2020-06-24 ENCOUNTER — Ambulatory Visit (HOSPITAL_COMMUNITY): Payer: PPO | Attending: Internal Medicine

## 2020-06-24 DIAGNOSIS — I10 Essential (primary) hypertension: Secondary | ICD-10-CM

## 2020-06-24 LAB — ECHOCARDIOGRAM COMPLETE
Area-P 1/2: 4.1 cm2
MV M vel: 5.11 m/s
MV Peak grad: 104.2 mmHg
S' Lateral: 4.6 cm

## 2020-06-25 ENCOUNTER — Telehealth: Payer: Self-pay | Admitting: Cardiovascular Disease

## 2020-06-25 NOTE — Telephone Encounter (Signed)
Follow Up:    Pt said he aw his Echo results on My Chart and would like for somebody please go over the results with him. He also wants to know what is the next step.

## 2020-06-25 NOTE — Telephone Encounter (Signed)
Dr.Berry would like to see patient at next available to discuss. Will route to primary nurse to review schedule to get him in.  Thank you!

## 2020-06-26 NOTE — Telephone Encounter (Signed)
Spoke to pt regarding return office visit to discuss recent echo. Pt is able to make appointment for Thursday, 07/02/20. Pt verbalizes understanding.

## 2020-06-27 ENCOUNTER — Ambulatory Visit
Admission: RE | Admit: 2020-06-27 | Discharge: 2020-06-27 | Disposition: A | Discharge status: Home / Self Care | Payer: PPO | Source: Ambulatory Visit | Attending: Family Medicine | Admitting: Family Medicine

## 2020-06-27 ENCOUNTER — Other Ambulatory Visit: Payer: Self-pay

## 2020-06-27 DIAGNOSIS — M545 Low back pain, unspecified: Secondary | ICD-10-CM | POA: Diagnosis not present

## 2020-06-27 DIAGNOSIS — S32000A Wedge compression fracture of unspecified lumbar vertebra, initial encounter for closed fracture: Secondary | ICD-10-CM

## 2020-06-27 IMAGING — MR MR LUMBAR SPINE W/O CM
4 of 5 series · 27 of 48 positions shown · non-contrast
Comparison: Radiography [DATE]

CLINICAL DATA: Lumbar region back pain over the last 3 weeks.

EXAM:
MRI LUMBAR SPINE WITHOUT CONTRAST
TECHNIQUE: Multiplanar, multisequence MR imaging of the lumbar spine was
performed. No intravenous contrast was administered.

[Series 2: T2 · sagittal · 4.0mm · 1.13mm/px · 6 of 17 slices shown (1 of 2)]
[im 1/17]
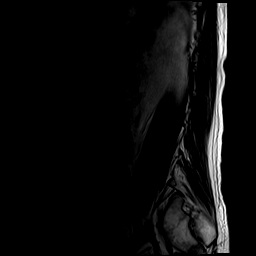
[im 4/17]
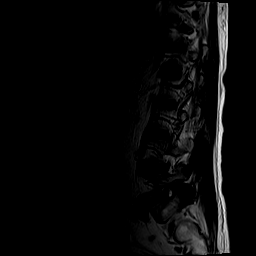
[im 7/17]
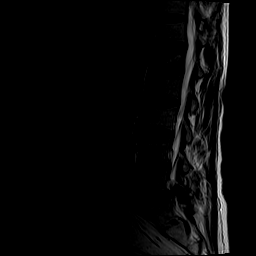
[im 10/17]
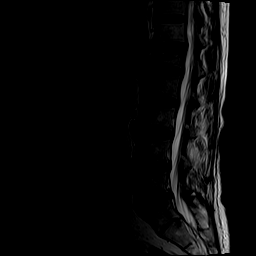
[im 13/17]
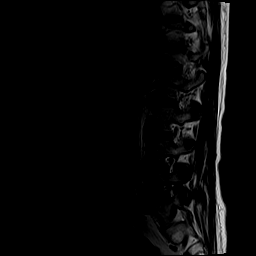
[im 17/17]
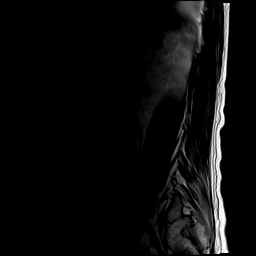

[Series 4: T1 · sagittal · 4.0mm · 1.09mm/px · 6 of 17 slices shown (1 of 2)]
[im 1/17]
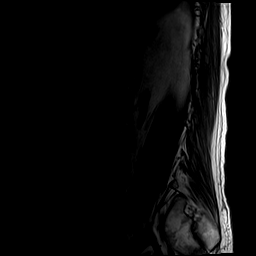
[im 4/17]
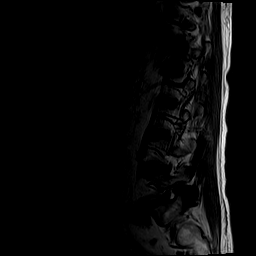
[im 7/17]
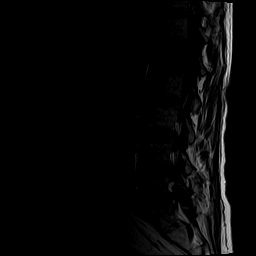
[im 10/17]
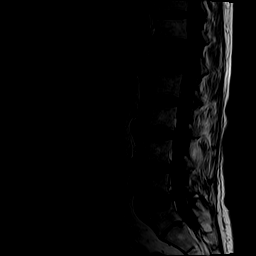
[im 13/17]
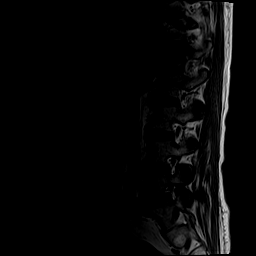
[im 17/17]
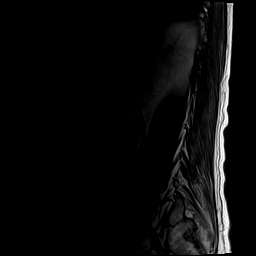

[Series 5: T2 · axial · 4.0mm · 0.39mm/px · z∈[-88,+119]mm · 9 of 41 slices shown (2 of 2)]
[im 1/41]
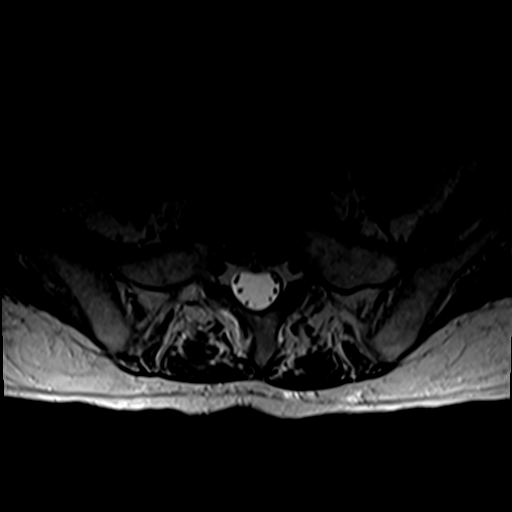
[im 6/41]
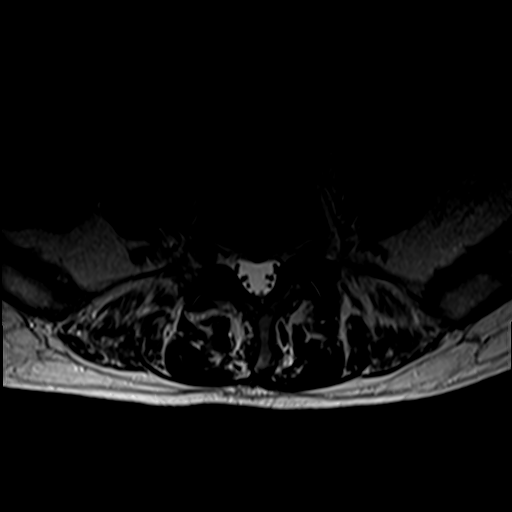
[im 12/41]
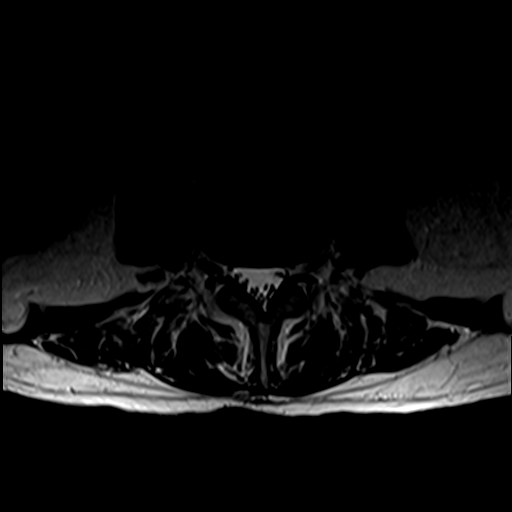
[im 18/41]
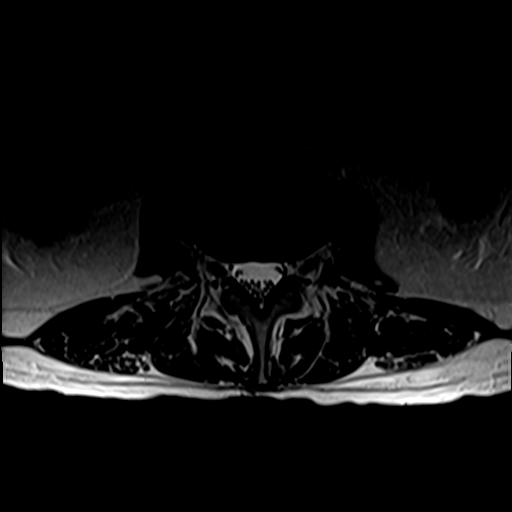
[im 21/41]
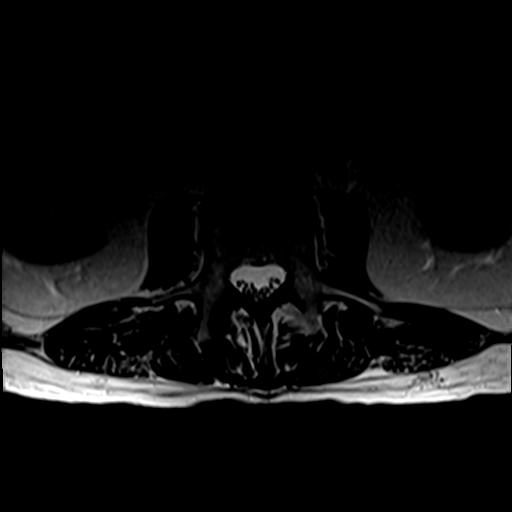
[im 23/41]
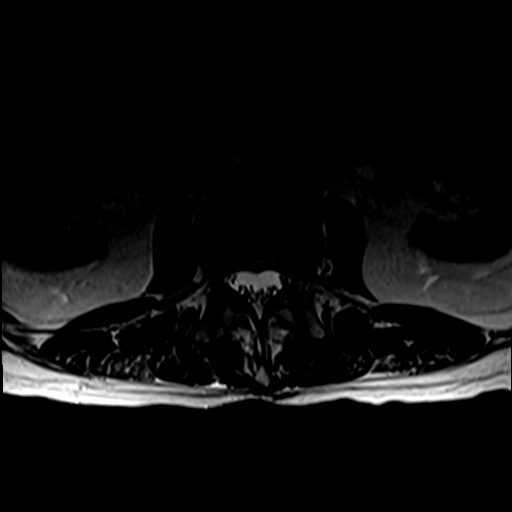
[im 29/41]
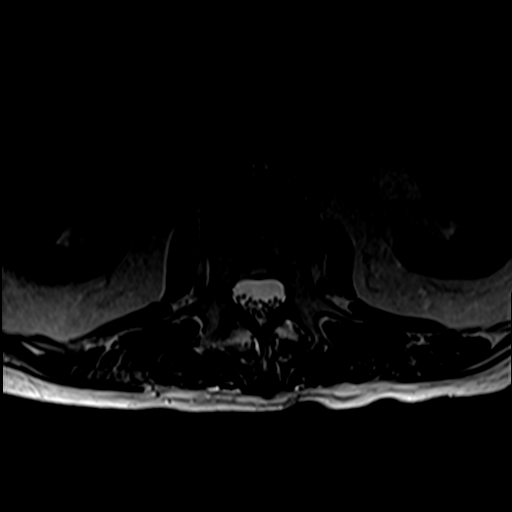
[im 35/41]
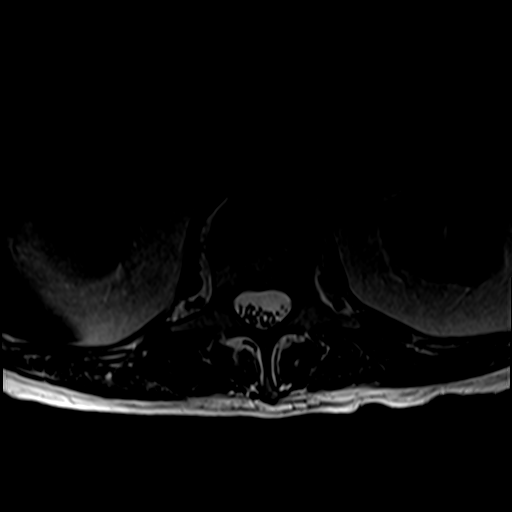
[im 41/41]
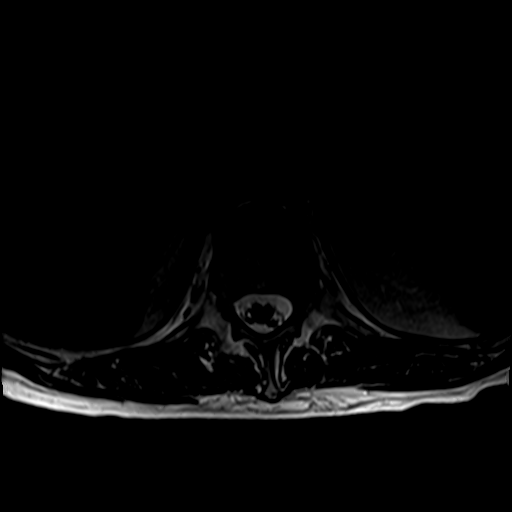

[Series 6: T1 · axial · 4.0mm · 0.39mm/px · z∈[-88,+90]mm · 6 of 41 slices shown (2 of 2)]
[im 1/41]
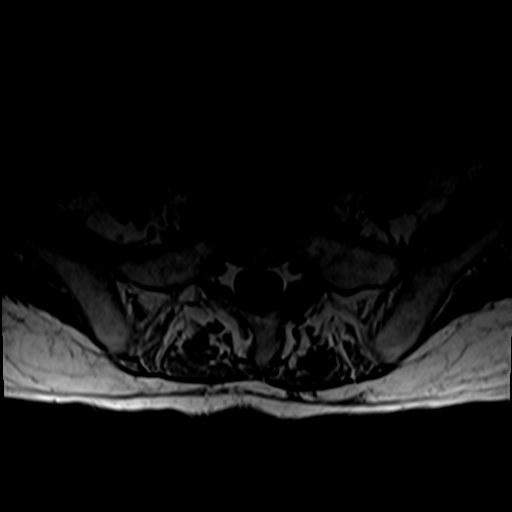
[im 6/41]
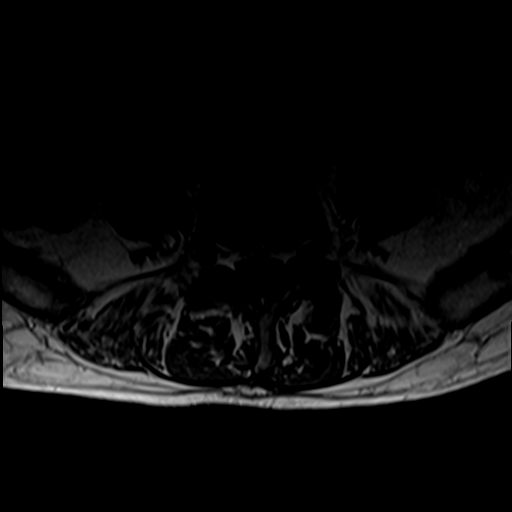
[im 12/41]
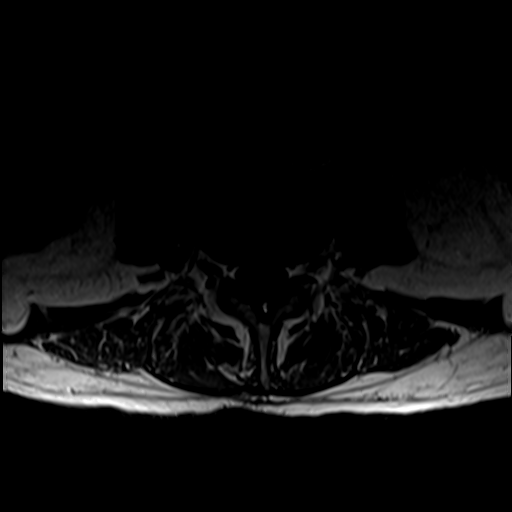
[im 18/41]
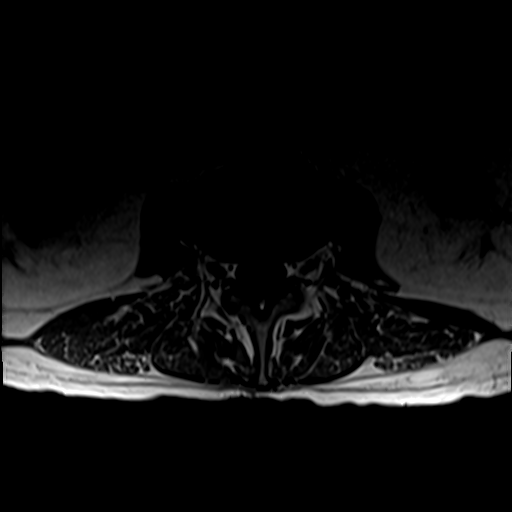
[im 21/41]
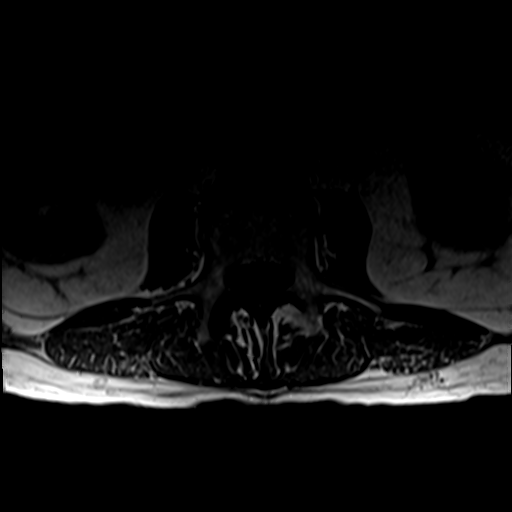
[im 35/41]
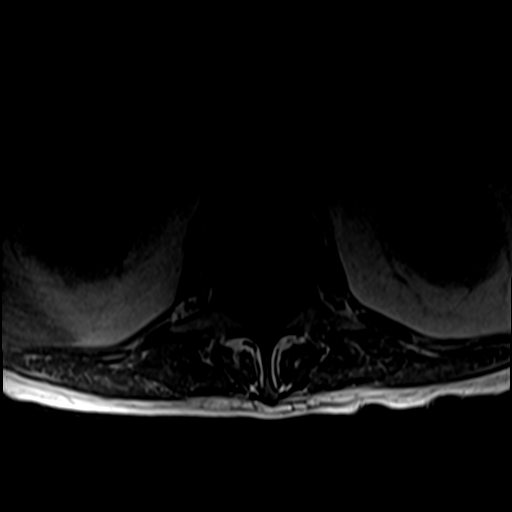

[27 of 48 positions shown; findings below may reference images not displayed]

FINDINGS: Segmentation:  5 lumbar type vertebral bodies.

Alignment:  Normal

Vertebrae: Acute/subacute superior endplate compression fracture at
L1 with loss of height of 40%. No retropulsed bone. No finding to
suggest that this represents anything other than a benign
osteoporotic fracture. No other regional primary bone finding.

Conus medullaris and cauda equina: Conus extends to the L1 level.
Conus and cauda equina appear normal.

Paraspinal and other soft tissues: Negative

Disc levels:

Minimal non-compressive disc bulges at T12-L1 and L1-2.

L2-3: Small central disc herniation indents the thecal sac but does
not appear to cause neural compression.

L3-4: Normal interspace.

L4-5: Minimal bulging of the disc. Minimal facet and ligamentous
hypertrophy. No compressive stenosis.

L5-S1: Mild bulging of the disc, more prominent towards the left.
Facet and ligamentous hypertrophy more prominent on the left.
Narrowing of the intervertebral foramen and subarticular lateral
recess on the left that could cause neural compression, particularly
of the left S1 nerve.
IMPRESSION: Acute/subacute superior endplate fracture at L1 with loss of height
of 40%. No retropulsed bone. No traumatic disc herniation. No
finding to suggest that this represents anything other than a benign
fracture.

L2-3: Small central disc herniation indents the thecal sac but does
not appear to cause neural compression.

L5-S1: Bulging of the disc more prominent towards the left. Facet
degeneration and hypertrophy more prominent on the left. Narrowing
of the intervertebral foramen and subarticular lateral recess on the
left that could possibly cause left-sided nerve compression,
particularly of the left S1 nerve.

## 2020-07-02 ENCOUNTER — Other Ambulatory Visit: Payer: Self-pay

## 2020-07-02 ENCOUNTER — Encounter: Payer: Self-pay | Admitting: Cardiovascular Disease

## 2020-07-02 ENCOUNTER — Ambulatory Visit: Payer: PPO | Admitting: Cardiovascular Disease

## 2020-07-02 VITALS — BP 142/74 | HR 78 | Ht 66.0 in | Wt 140.0 lb

## 2020-07-02 DIAGNOSIS — I519 Heart disease, unspecified: Secondary | ICD-10-CM

## 2020-07-02 MED ORDER — CARVEDILOL 6.25 MG PO TABS
6.2500 mg | ORAL_TABLET | Freq: Two times a day (BID) | ORAL | 1 refills | Status: DC
Start: 2020-07-02 — End: 2020-07-20

## 2020-07-02 NOTE — Patient Instructions (Addendum)
Medication Instructions:   -Stop taking verapamil.  -Start taking carvedilol (coreg) 6.25mg  twice daily.   *If you need a refill on your cardiac medications before your next appointment, please call your pharmacy*   Testing/Procedures: Your physician has requested that you have a lexiscan myoview. For further information please visit [email or web address] Please follow instruction sheet, as given. This will take place at [address] [facility], [address]  How to prepare for your Myocardial Perfusion Test:  Do not eat or drink 3 hours prior to your test, except you may have water.  Do not consume products containing caffeine (regular or decaffeinated) 12 hours prior to your test. (ex: coffee, chocolate, sodas, tea).  Do bring a list of your current medications with you.  If not listed below, you may take your medications as normal.  Do wear comfortable clothes (no dresses or overalls) and walking shoes, tennis shoes preferred (No heels or open toe shoes are allowed).  Do NOT wear cologne, perfume, aftershave, or lotions (deodorant is allowed).  The test will take approximately 3 to 4 hours to complete  If these instructions are not followed, your test will have to be rescheduled.   Follow-Up: At [facility], you and your health needs are our priority.  As part of our continuing mission to provide you with exceptional heart care, we have created designated Provider Care Teams.  These Care Teams include your primary Cardiologist (physician) and Advanced Practice Providers (APPs -  Physician Assistants and Nurse Practitioners) who all work together to provide you with the care you need, when you need it.  We recommend signing up for the patient portal called "MyChart".  Sign up information is provided on this After Visit Summary.  MyChart is used to connect with patients for Virtual Visits (Telemedicine).  Patients are able to view lab/test results, encounter notes, upcoming appointments,  etc.  Non-urgent messages can be sent to your provider as well.   To learn more about what you can do with MyChart, go to [email or web address].    Your next appointment:   3 month(s)  The format for your next appointment:   In Person  Provider:   Nanetta Batty, MD   Other Instructions Please keep a blood pressure log and return in 2-3 weeks to see a PharmD for medication titration.

## 2020-07-02 NOTE — Progress Notes (Signed)
[name] returns today for follow-up of his 2D echocardiogram which I obtained on 06/24/2020.  His EF was 20 to 25% with grade 3 diastolic dysfunction.  He had mild to moderate mitral regurgitation and a slightly dilated aortic root at 42 mm.  His cough is somewhat getting better.  He does complain of generalized fatigue over the last several months.  He has had pneumonia in the recent past as well.  He did have a chest CT performed 03/28/2020 that showed calcification in all 3 coronary arteries.  I Ernie Hew get a Lexiscan to further evaluate ischemic versus nonischemic cardiomyopathy.  He is currently on losartan but not on a beta-blocker.  I am going to stop his verapamil and begin him on low-dose carvedilol.  We will have our Pharm.D. titrate this up to 25 mg p.o. twice daily over the next 6 to 12 weeks.  I am referring him to the advanced heart failure clinic for further evaluation and initiation of guideline directed optimal medical therapy.  I will see him back in 3 months for follow-up.  Runell Gess, M.D., FACP, [facility], [facility] [address] [place], [place]  [address]  [phone] 07/02/2020 9:54 AM

## 2020-07-09 ENCOUNTER — Telehealth (HOSPITAL_COMMUNITY): Payer: Self-pay | Admitting: *Deleted

## 2020-07-09 NOTE — Telephone Encounter (Signed)
Close encounter 

## 2020-07-10 ENCOUNTER — Ambulatory Visit (HOSPITAL_COMMUNITY)
Admission: RE | Admit: 2020-07-10 | Discharge: 2020-07-10 | Disposition: A | Discharge status: Home / Self Care | Payer: PPO | Source: Ambulatory Visit | Attending: Cardiovascular Disease | Admitting: Cardiovascular Disease

## 2020-07-10 ENCOUNTER — Other Ambulatory Visit: Payer: Self-pay

## 2020-07-10 DIAGNOSIS — I519 Heart disease, unspecified: Secondary | ICD-10-CM

## 2020-07-10 DIAGNOSIS — I340 Nonrheumatic mitral (valve) insufficiency: Secondary | ICD-10-CM | POA: Diagnosis not present

## 2020-07-10 LAB — MYOCARDIAL PERFUSION IMAGING
LV dias vol: 218 mL (ref 62–150)
LV sys vol: 165 mL
Peak HR: 86 {beats}/min
Rest HR: 80 {beats}/min
SDS: 1
SRS: 5
SSS: 6
TID: 0.99

## 2020-07-10 MED ORDER — TECHNETIUM TC 99M TETROFOSMIN IV KIT
31.7000 | PACK | Freq: Once | INTRAVENOUS | Status: AC | PRN
  Administered 2020-07-10: 31.7 via INTRAVENOUS
  Filled 2020-07-10: qty 32

## 2020-07-10 MED ORDER — REGADENOSON 0.4 MG/5ML IV SOLN
0.4000 mg | Freq: Once | INTRAVENOUS | Status: AC
  Administered 2020-07-10: 0.4 mg via INTRAVENOUS

## 2020-07-10 MED ORDER — TECHNETIUM TC 99M TETROFOSMIN IV KIT
9.6000 | PACK | Freq: Once | INTRAVENOUS | Status: AC | PRN
  Administered 2020-07-10: 9.6 via INTRAVENOUS
  Filled 2020-07-10: qty 10

## 2020-07-11 ENCOUNTER — Other Ambulatory Visit: Payer: Self-pay

## 2020-07-11 DIAGNOSIS — I779 Disorder of arteries and arterioles, unspecified: Secondary | ICD-10-CM

## 2020-07-19 ENCOUNTER — Other Ambulatory Visit: Payer: Self-pay | Admitting: Cardiovascular Disease

## 2020-07-28 ENCOUNTER — Other Ambulatory Visit: Payer: Self-pay

## 2020-07-28 ENCOUNTER — Ambulatory Visit (INDEPENDENT_AMBULATORY_CARE_PROVIDER_SITE_OTHER): Payer: PPO | Admitting: Pharmacist

## 2020-07-28 VITALS — BP 162/84 | HR 76 | Resp 17 | Ht 66.0 in | Wt 138.8 lb

## 2020-07-28 DIAGNOSIS — I10 Essential (primary) hypertension: Secondary | ICD-10-CM

## 2020-07-28 DIAGNOSIS — I5020 Unspecified systolic (congestive) heart failure: Secondary | ICD-10-CM

## 2020-07-28 MED ORDER — CARVEDILOL 6.25 MG PO TABS: ORAL_TABLET | ORAL | 3 refills | Status: DC

## 2020-07-28 NOTE — Progress Notes (Signed)
Patient ID: David Sellers                 DOB: 1940/06/13                      MRN: [id number]     HPI: David Sellers is a 80 y.o. male referred by Dr. Allyson Sabal to pharmacist clinic for medication titration. PMH includes non-ischemic cardiomyopathy with EF 20-25%, CAD, hypertension, and hyperlipidemia.  David Sellers presents to clinic accompany by his wife. Denies problems with current medication, but reports occasional dizziness. Denies falls or increase dizziness since carvedilol therapy started.   Current HTN/HF meds:  Carvedilol 6.25mg  twice daily Losartan 100mg  daily Doxazosin 8mg  daily Furosemide 20mg  daily  BP goal: <130/80  Social History: No tobacco or alcohol use  Diet: mainly home cooked meals  Exercise: activities of daily living. Still working as full time  Home BP readings:  21 BP measurements, average 118/66, HR range 58-69  Wt Readings from Last 3 Encounters:  07/28/20 138 lb 12.8 oz (63 kg)  07/10/20 140 lb (63.5 kg)  07/02/20 140 lb (63.5 kg)   BP Readings from Last 3 Encounters:  07/28/20 (!) 162/84  07/02/20 (!) 142/74  05/13/20 130/78   Pulse Readings from Last 3 Encounters:  07/28/20 76  07/02/20 78  05/13/20 88    Past Medical History:  Diagnosis Date   Arthritis    Asthma    only has flareup with smelly perfume    BPH (benign prostatic hyperplasia)    Carotid artery occlusion    right ICA 50% stenosis   Carotodynia    Cataract    immature to the right eye   Diabetes mellitus without complication (HCC)    borderline   GERD (gastroesophageal reflux disease)    takes Omeprazole daily   Headache(784.0)    r/t arthritis in neck    Hemorrhoids    History of bronchitis    last time 10-29yrs ago   History of colon polyps    Hyperlipidemia    no meds since Jan 1   Hypertension    takes Verapamil,Lisinopril,and Cardura daily   Joint pain    Legally blind in left eye, as defined in 07/13/20    hit by a baseball   Low back pain     reason unknown   Prolapsed hemorrhoids    Urinary frequency    sees Dr.Tannenbaum    Current Outpatient Medications on File Prior to Visit  Medication Sig Dispense Refill   aspirin EC 81 MG tablet Take 81 mg by mouth every morning.     Biotin 1000 MCG CHEW Chew by mouth.     cetirizine (ZYRTEC) 10 MG tablet Take 10 mg by mouth daily.     doxazosin (CARDURA) 4 MG tablet Take 4 mg by mouth daily. Take 1 tablet twice a day     KRILL OIL PO Take 1 capsule by mouth 2 (two) times daily.      loratadine (CLARITIN) 10 MG tablet Take 10 mg by mouth daily.     losartan (COZAAR) 100 MG tablet Take 100 mg by mouth daily.  6   lovastatin (MEVACOR) 20 MG tablet Take 20 mg by mouth at bedtime.     metFORMIN HCl ER 500 MG/5ML SRER      omeprazole (PRILOSEC) 20 MG capsule Take 20 mg by mouth every morning.     ONETOUCH VERIO test strip 2 (two) times  daily.     No current facility-administered medications on file prior to visit.    Allergies  Allergen Reactions   Albuterol Anaphylaxis    Per patient and daughter   Meloxicam Itching, Palpitations and Other (See Comments)    Headaches, Mood swings.   Morphine And Related Nausea And Vomiting   Statins Other (See Comments)    Severe arthritic response   Cholestoff [Plant Sterols And Stanols] Other (See Comments)    Muscle aches   Nsaids Other (See Comments)    Aleve ,  Myalgia   Atorvastatin     Severe myalgias   Cortisone Hypertension    Patient states cortisone injections he has received for his knees cause hypertension and hyperglycemia   Darvon [Propoxyphene] Other (See Comments)    hallucinate   Doxycycline     Other reaction(s): Coughing   Glimepiride Itching   Hydrochlorothiazide     Other reaction(s): Low Blood Pressure   Indomethacin     Neck pain flare   Other     Other reaction(s): High Blood Pressure Other reaction(s): Hallucinate   Penicillins     *positive allergy test*   Pravastatin     myalgias   Sulfa  Antibiotics Nausea And Vomiting   Terbinafine     Other reaction(s): diarrhea   Tramadol     Blood pressure (!) 162/84, pulse 76, resp. rate 17, height 5\' 6"  (1.676 m), weight 138 lb 12.8 oz (63 kg), SpO2 99 %.  HFrEF (heart failure with reduced ejection fraction) (HCC) Blood pressure elevated during OV, but well controlled at home with 118/66 home BP average. Patient was instructed to bring home BP cuff to next OV to conform measurements are accurate.   Noted he is taking doxazosin 4mg  BID for prostate and BP problems. He is reluctant to decrease dose of stop medication at this time (former PCP instructed patient to "never stop" this medication). Will increase carvedilol to 6.25mg  every morning and 12.5mg  every evening. Plan to follow up in 2 weeks, to continue carvedilol titration. Will like to decrease doxazosin to 4mg  every evening and increase carvedilol to 12.5mg  BID, if patient able to tolerate. Noted appointment with A.Fib clinic already scheduled for July/29/2022.   Mcguire Gasparyan Rodriguez-Guzman PharmD, BCPS, CPP [facility] [address] [place] [facility] 08/03/2020 5:01 PM

## 2020-07-28 NOTE — Patient Instructions (Addendum)
Return for a a follow up appointment in 2-3 weeks for medication titration  Check your blood pressure at home daily (if able) and keep record of the readings.  Take your BP meds as follows: *INCREASE carvedilol to 1 tablet every morning and 2 tables every evening*  Bring all of your meds, your BP cuff and your record of home blood pressures to your next appointment.  Exercise as you're able, try to walk approximately 30 minutes per day.  Keep salt intake to a minimum, especially watch canned and prepared boxed foods.  Eat more fresh fruits and vegetables and fewer canned items.  Avoid eating in fast food restaurants.    HOW TO TAKE YOUR BLOOD PRESSURE: Rest 5 minutes before taking your blood pressure.  Don't smoke or drink caffeinated beverages for at least 30 minutes before. Take your blood pressure before (not after) you eat. Sit comfortably with your back supported and both feet on the floor (don't cross your legs). Elevate your arm to heart level on a table or a desk. Use the proper sized cuff. It should fit smoothly and snugly around your bare upper arm. There should be enough room to slip a fingertip under the cuff. The bottom edge of the cuff should be 1 inch above the crease of the elbow. Ideally, take 3 measurements at one sitting and record the average.

## 2020-07-31 DIAGNOSIS — N433 Hydrocele, unspecified: Secondary | ICD-10-CM | POA: Diagnosis not present

## 2020-08-03 ENCOUNTER — Encounter: Payer: Self-pay | Admitting: Pharmacist

## 2020-08-03 DIAGNOSIS — I5020 Unspecified systolic (congestive) heart failure: Secondary | ICD-10-CM | POA: Insufficient documentation

## 2020-08-03 NOTE — Assessment & Plan Note (Addendum)
Blood pressure elevated during OV, but well controlled at home with 118/66 home BP average. Patient was instructed to bring home BP cuff to next OV to conform measurements are accurate.   Noted he is taking doxazosin 4mg  BID for prostate and BP problems. He is reluctant to decrease dose of stop medication at this time (former PCP instructed patient to "never stop" this medication). Will increase carvedilol to 6.25mg  every morning and 12.5mg  every evening. Plan to follow up in 2 weeks, to continue carvedilol titration. Will like to decrease doxazosin to 4mg  every evening and increase carvedilol to 12.5mg  BID, if patient able to tolerate. Noted appointment with A.Fib clinic already scheduled for July/29/2022.   We discuss need to add multiple medications to treat EF of 25%, but patient  concern with medication affordability.

## 2020-08-13 ENCOUNTER — Ambulatory Visit (INDEPENDENT_AMBULATORY_CARE_PROVIDER_SITE_OTHER): Payer: PPO | Admitting: Pharmacist Clinician (PhC)/ Clinical Pharmacy Specialist

## 2020-08-13 ENCOUNTER — Other Ambulatory Visit: Payer: Self-pay

## 2020-08-13 VITALS — BP 152/82 | HR 66 | Resp 16 | Ht 66.0 in | Wt 138.0 lb

## 2020-08-13 DIAGNOSIS — I10 Essential (primary) hypertension: Secondary | ICD-10-CM

## 2020-08-13 DIAGNOSIS — I5020 Unspecified systolic (congestive) heart failure: Secondary | ICD-10-CM

## 2020-08-13 MED ORDER — ENTRESTO 49-51 MG PO TABS: 1.0000 | ORAL_TABLET | Freq: Two times a day (BID) | ORAL | 0 refills | Status: DC

## 2020-08-13 MED ORDER — CARVEDILOL 6.25 MG PO TABS: ORAL_TABLET | ORAL | 3 refills | Status: DC

## 2020-08-13 MED ORDER — SACUBITRIL-VALSARTAN 49-51 MG PO TABS: 1.0000 | ORAL_TABLET | Freq: Two times a day (BID) | ORAL | 6 refills | Status: DC

## 2020-08-13 NOTE — Patient Instructions (Signed)
Return for a a follow up appointment with Dr. Shirlee Latch on July 29  Go to the lab in 2 weeks to check kidney function and electrolytes - [facility] in [place] - [address]    Check your blood pressure at home daily and keep record of the readings.  Take your meds as follows:  STOP LOSARTAN  START ENTRESTO 49/51 MG TWICE DAILY  CUT DOXAZOSIN BACK TO JUST 4 MG EACH NIGHT  Bring all of your meds, your BP cuff and your record of home blood pressures to your next appointment.  Exercise as you're able, try to walk approximately 30 minutes per day.  Keep salt intake to a minimum, especially watch canned and prepared boxed foods.  Eat more fresh fruits and vegetables and fewer canned items.  Avoid eating in fast food restaurants.    HOW TO TAKE YOUR BLOOD PRESSURE: Rest 5 minutes before taking your blood pressure.  Don't smoke or drink caffeinated beverages for at least 30 minutes before. Take your blood pressure before (not after) you eat. Sit comfortably with your back supported and both feet on the floor (don't cross your legs). Elevate your arm to heart level on a table or a desk. Use the proper sized cuff. It should fit smoothly and snugly around your bare upper arm. There should be enough room to slip a fingertip under the cuff. The bottom edge of the cuff should be 1 inch above the crease of the elbow. Ideally, take 3 measurements at one sitting and record the average.

## 2020-08-13 NOTE — Assessment & Plan Note (Signed)
Patient with apparent white coat hypertension.  Office reading was 30 points higher than home average and both home and office cuff were within 3 points when checked in the office.  Since he also has a diagnosis of HFrEF and is willing to try [facility], will have him discontinue losartan and cut doxazosin dose from 4 mg bid to 4 mg at hs only.  Will start him on Entresto 49/51 mg twice daily.  He is to continue with home BP monitoring, and is aware that his pressure may fluctuate for a few days after switching meds.  We also discussed using spironolactone in effort to have him on GDMT, but will see what his pressure does with this switch first.   He is scheduled to see HF clinic in about 3 weeks.  Gave sample of Entresto 49/51 as well as 30 day free voucher.  Patient understands to call back if any questions or concerns.

## 2020-08-13 NOTE — Progress Notes (Signed)
Patient ID: David Sellers                 DOB: 1940-03-26                      MRN: [id number]     HPI: David Sellers is a 80 y.o. male referred by Dr. Allyson Sabal to pharmacist clinic for medication titration. PMH includes non-ischemic cardiomyopathy with EF 20-25%, CAD, hypertension, and hyperlipidemia.  He was seen by Dr. Allyson Sabal in May after having had an echocardiogram.  His EF was noted to be 20-25% with grade 3 diastolic dysfunction.  His blood pressure at the time was elevated at 142/74 and he was asked to follow up with CV[facility] for management.  He was then seen by Raquel Penni Homans in May.  It was noted that his home BP readings were much better than in office, so the only change was made to increase carvedilol from 6.25 mg bid to 6.25 mg in the mornings and 12.5 mg at night.    Today he returns for follow up.  He has not had any issues with the increase in carvedilol.  He has done some research on his own about starting [place] and while willing to try [place], does not wish to use [place].  States his endocrinologist has blood sugars well controlled and he would prefer that any medication for DM be through that office.    Current HTN/HF meds:  Carvedilol 6.25mg  am, 12.5 mg hs Losartan 100mg  daily Doxazosin 8mg  daily Furosemide 20mg  daily  BP goal: <130/80  Social History: No tobacco or alcohol use  Diet: mainly home cooked meals 70-80 oz water each day; feels as though he is eating more, weight is not changed, however he recently had to buy new pants as his have become too large  Exercise: activities of daily living. Still working as full time  Home BP readings: brought home cuff today, read within 5 points of office reading.   30 readings, average 124/68, HR range 56-71  Previous 21 readings, average 118/66, HR range 58-69  Wt Readings from Last 3 Encounters:  08/13/20 138 lb (62.6 kg)  07/28/20 138 lb 12.8 oz (63 kg)  07/10/20 140 lb (63.5  kg)   BP Readings from Last 3 Encounters:  08/13/20 (!) 152/82  07/28/20 (!) 162/84  07/02/20 (!) 142/74   Pulse Readings from Last 3 Encounters:  08/13/20 66  07/28/20 76  07/02/20 78    Past Medical History:  Diagnosis Date   Arthritis    Asthma    only has flareup with smelly perfume    BPH (benign prostatic hyperplasia)    Carotid artery occlusion    right ICA 50% stenosis   Carotodynia    Cataract    immature to the right eye   Diabetes mellitus without complication (HCC)    borderline   GERD (gastroesophageal reflux disease)    takes Omeprazole daily   Headache(784.0)    r/t arthritis in neck    Hemorrhoids    History of bronchitis    last time 10-78yrs ago   History of colon polyps    Hyperlipidemia    no meds since Jan 1   Hypertension    takes Verapamil,Lisinopril,and Cardura daily   Joint pain    Legally blind in left eye, as defined in 07/04/20    hit by a baseball   Low back pain  reason unknown   Prolapsed hemorrhoids    Urinary frequency    sees Dr.Tannenbaum    Current Outpatient Medications on File Prior to Visit  Medication Sig Dispense Refill   aspirin EC 81 MG tablet Take 81 mg by mouth every morning.     Biotin 1000 MCG CHEW Chew by mouth.     cetirizine (ZYRTEC) 10 MG tablet Take 10 mg by mouth daily.     doxazosin (CARDURA) 4 MG tablet Take 4 mg by mouth daily. Take 1 tablet twice a day     KRILL OIL PO Take 1 capsule by mouth 2 (two) times daily.      lovastatin (MEVACOR) 20 MG tablet Take 20 mg by mouth at bedtime.     metFORMIN HCl ER 500 MG/5ML SRER      omeprazole (PRILOSEC) 20 MG capsule Take 20 mg by mouth every morning.     ONETOUCH VERIO test strip 2 (two) times daily.     No current facility-administered medications on file prior to visit.    Allergies  Allergen Reactions   Albuterol Anaphylaxis    Per patient and daughter   Meloxicam Itching, Palpitations and Other (See Comments)    Headaches, Mood swings.   Morphine  And Related Nausea And Vomiting   Statins Other (See Comments)    Severe arthritic response   Cholestoff [Plant Sterols And Stanols] Other (See Comments)    Muscle aches   Nsaids Other (See Comments)    Aleve ,  Myalgia   Atorvastatin     Severe myalgias   Cortisone Hypertension    Patient states cortisone injections he has received for his knees cause hypertension and hyperglycemia   [place] [Propoxyphene] Other (See Comments)    hallucinate   Doxycycline     Other reaction(s): Coughing   Glimepiride Itching   Hydrochlorothiazide     Other reaction(s): Low Blood Pressure   Indomethacin     Neck pain flare   Other     Other reaction(s): High Blood Pressure Other reaction(s): Hallucinate   Penicillins     *positive allergy test*   Pravastatin     myalgias   Sulfa Antibiotics Nausea And Vomiting   Terbinafine     Other reaction(s): diarrhea   Tramadol     Blood pressure (!) 152/82, pulse 66, resp. rate 16, height 5\' 6"  (1.676 m), weight 138 lb (62.6 kg), SpO2 98 %.  Essential hypertension Patient with apparent white coat hypertension.  Office reading was 30 points higher than home average and both home and office cuff were within 3 points when checked in the office.  Since he also has a diagnosis of HFrEF and is willing to try [facility], will have him discontinue losartan and cut doxazosin dose from 4 mg bid to 4 mg at hs only.  Will start him on Entresto 49/51 mg twice daily.  He is to continue with home BP monitoring, and is aware that his pressure may fluctuate for a few days after switching meds.  We also discussed using spironolactone in effort to have him on GDMT, but will see what his pressure does with this switch first.   He is scheduled to see HF clinic in about 3 weeks.  Gave sample of Entresto 49/51 as well as 30 day free voucher.  Patient understands to call back if any questions or concerns.     [facility] PharmD CPP [facility] [address] [place] 08/13/2020  4:49 PM

## 2020-08-18 ENCOUNTER — Telehealth (HOSPITAL_COMMUNITY): Payer: Self-pay | Admitting: *Deleted

## 2020-08-25 DIAGNOSIS — E538 Deficiency of other specified B group vitamins: Secondary | ICD-10-CM | POA: Diagnosis not present

## 2020-08-25 DIAGNOSIS — E785 Hyperlipidemia, unspecified: Secondary | ICD-10-CM | POA: Diagnosis not present

## 2020-08-25 DIAGNOSIS — E1165 Type 2 diabetes mellitus with hyperglycemia: Secondary | ICD-10-CM | POA: Diagnosis not present

## 2020-09-01 DIAGNOSIS — Z6824 Body mass index (BMI) 24.0-24.9, adult: Secondary | ICD-10-CM | POA: Diagnosis not present

## 2020-09-01 DIAGNOSIS — E1165 Type 2 diabetes mellitus with hyperglycemia: Secondary | ICD-10-CM | POA: Diagnosis not present

## 2020-09-01 DIAGNOSIS — I779 Disorder of arteries and arterioles, unspecified: Secondary | ICD-10-CM | POA: Diagnosis not present

## 2020-09-01 DIAGNOSIS — E785 Hyperlipidemia, unspecified: Secondary | ICD-10-CM | POA: Diagnosis not present

## 2020-09-01 DIAGNOSIS — I10 Essential (primary) hypertension: Secondary | ICD-10-CM | POA: Diagnosis not present

## 2020-09-01 DIAGNOSIS — E538 Deficiency of other specified B group vitamins: Secondary | ICD-10-CM | POA: Diagnosis not present

## 2020-09-01 DIAGNOSIS — E119 Type 2 diabetes mellitus without complications: Secondary | ICD-10-CM | POA: Diagnosis not present

## 2020-09-04 ENCOUNTER — Ambulatory Visit (HOSPITAL_COMMUNITY)
Admission: RE | Admit: 2020-09-04 | Discharge: 2020-09-04 | Disposition: A | Discharge status: Home / Self Care | Payer: PPO | Source: Ambulatory Visit | Attending: Cardiology | Admitting: Cardiology

## 2020-09-04 ENCOUNTER — Encounter (HOSPITAL_COMMUNITY): Payer: Self-pay | Admitting: Cardiology

## 2020-09-04 ENCOUNTER — Other Ambulatory Visit: Payer: Self-pay

## 2020-09-04 VITALS — BP 140/80 | HR 62 | Wt 136.6 lb

## 2020-09-04 DIAGNOSIS — Z8249 Family history of ischemic heart disease and other diseases of the circulatory system: Secondary | ICD-10-CM | POA: Diagnosis not present

## 2020-09-04 DIAGNOSIS — Z7982 Long term (current) use of aspirin: Secondary | ICD-10-CM | POA: Diagnosis not present

## 2020-09-04 DIAGNOSIS — I110 Hypertensive heart disease with heart failure: Secondary | ICD-10-CM | POA: Insufficient documentation

## 2020-09-04 DIAGNOSIS — I255 Ischemic cardiomyopathy: Secondary | ICD-10-CM | POA: Diagnosis not present

## 2020-09-04 DIAGNOSIS — Z7984 Long term (current) use of oral hypoglycemic drugs: Secondary | ICD-10-CM | POA: Diagnosis not present

## 2020-09-04 DIAGNOSIS — I5020 Unspecified systolic (congestive) heart failure: Secondary | ICD-10-CM

## 2020-09-04 DIAGNOSIS — Z79899 Other long term (current) drug therapy: Secondary | ICD-10-CM | POA: Diagnosis not present

## 2020-09-04 DIAGNOSIS — R0683 Snoring: Secondary | ICD-10-CM | POA: Diagnosis not present

## 2020-09-04 DIAGNOSIS — I2510 Atherosclerotic heart disease of native coronary artery without angina pectoris: Secondary | ICD-10-CM | POA: Diagnosis not present

## 2020-09-04 DIAGNOSIS — I6522 Occlusion and stenosis of left carotid artery: Secondary | ICD-10-CM | POA: Insufficient documentation

## 2020-09-04 DIAGNOSIS — I6529 Occlusion and stenosis of unspecified carotid artery: Secondary | ICD-10-CM

## 2020-09-04 DIAGNOSIS — I5022 Chronic systolic (congestive) heart failure: Secondary | ICD-10-CM | POA: Insufficient documentation

## 2020-09-04 MED ORDER — SPIRONOLACTONE 25 MG PO TABS: 12.5000 mg | ORAL_TABLET | Freq: Every day | ORAL | 3 refills | Status: DC

## 2020-09-04 NOTE — Patient Instructions (Addendum)
EKG done today.  No Labs done today.   START Spironolactone 12.5mg  (1/2 tablet) by mouth daily.   No other medication changes were made. Please continue all current medications as prescribed.  Your physician recommends that you schedule a follow-up appointment in: 10 days for a lab only appointment, 3 weeks for an appointment with our Clinic Pharmacist and in 6 weeks with Dr. Shirlee Latch  If you have any questions or concerns before your next appointment please send [place] a message through [facility] or call our office at [phone].    TO LEAVE A MESSAGE FOR THE NURSE SELECT OPTION 2, PLEASE LEAVE A MESSAGE INCLUDING: YOUR NAME DATE OF BIRTH CALL BACK NUMBER REASON FOR CALL**this is important as we prioritize the call backs  YOU WILL RECEIVE A CALL BACK THE SAME DAY AS LONG AS YOU CALL BEFORE 4:00 PM   Do the following things EVERYDAY: Weigh yourself in the morning before breakfast. Write it down and keep it in a log. Take your medicines as prescribed Eat low salt foods--Limit salt (sodium) to 2000 mg per day.  Stay as active as you can everyday Limit all fluids for the day to less than 2 liters   At the [facility], you and your health needs are our priority. As part of our continuing mission to provide you with exceptional heart care, we have created designated Provider Care Teams. These Care Teams include your primary Cardiologist (physician) and Advanced Practice Providers (APPs- Physician Assistants and Nurse Practitioners) who all work together to provide you with the care you need, when you need it.   You may see any of the following providers on your designated Care Team at your next follow up: Dr Arvilla Meres Dr Carron Curie, NP Robbie Lis, [place] Karle Plumber, PharmD   Please be sure to bring in all your medications bottles to every appointment.     You are scheduled for a Cardiac Catheterization on Tuesday, August 2 with Dr. Marca Ancona.  1. Please arrive at the [facility] (Main Entrance A) at [facility] [address] [place], [place] [address] at 11:30 AM (This time is two hours before your procedure to ensure your preparation). Free valet parking service is available.   Special note: Every effort is made to have your procedure done on time. Please understand that emergencies sometimes delay scheduled procedures.  2. Diet: Do not eat solid foods after midnight.  The patient may have clear liquids until 5am upon the day of the procedure.   3. Medication instructions in preparation for your procedure:  Do not take Diabetes Med Glucophage (Metformin) on the day of the procedure and HOLD 48 HOURS AFTER THE PROCEDURE.  On the morning of your procedure, take your Aspirin and any morning medicines NOT listed above.  You may use sips of water.  4. Plan for one night stay--bring personal belongings. 5. Bring a current list of your medications and current insurance cards. 6. You MUST have a responsible person to drive you home. 7. Someone MUST be with you the first 24 hours after you arrive home or your discharge will be delayed. 8. Please wear clothes that are easy to get on and off and wear slip-on shoes.  Thank you for allowing [place] to care for you!   -- [facility]

## 2020-09-06 NOTE — H&P (View-Only) (Signed)
PCP: Irena Reichmann, DO Cardiology: Dr. Allyson Sabal HF Cardiology: Dr. Shirlee Latch  80 y.o. with history of cardiomyopathy of uncertain etiology, carotid stenosis, HTN, type 2 diabetes was referred by Dr. Allyson Sabal for evaluation of CHF.  Patient had left CEA in 4/15.  Cardiolite in 3/15 prior to CEA was normal.  He generally did well until 12/21.  Starting at that time, he had 3 episodes of "pneumonia."  In 2/22, due to respiratory symptoms, he had a CT chest.  This showed 3 vessel coronary calcification and ground glass opacities concerning for pulmonary edema. He saw a pulmonologist, and was thought to have pulmonary edema as a cause of his dyspnea (not a primary pulmonary problem).  He does not smoke.  Echo was done in 5/22 due to ongoing dyspnea and fatigue, showing EF 25% with normal RV, PASP 86 mmHg.  Cardiolite was then done in 6/22, showing EF 25% with evidence for prior inferior/inferolateral MI with peri-infarct ischemia.   Patient has had no severe chest pain episodes though he has episodes of jaw pain, no definite trigger.  The episodes of "pneumonia" over the winter were characterized by cough and dyspnea but not fever.  He fatigues easily.  He does try to walk around his house for 30 minutes/day.  He can climb a flight of stairs but is tired at the top.  No orthopnea/PND.  No lightheadedness. He snores at night.   Labs (4/22): K 3.7, creatinine 1.0  ECG (personally reviewed): NSR, lateral TWIs  PMH:  1. HTN 2. BPH 3. Carotid stenosis: Left CEA in 4/15.  4. Type 2 diabetes 5. Chronic systolic CHF: Cardiomyopathy of uncertain etiology.  - Cardiolite in 3/15 was normal - CT chest in 2/22 with 3 vessel CAD and patchy GGO that may be due to pulmonary edema.  - Echo (5/22): EF 20-25%, normal RV, mild-moderate MR, PASP 86 mmHg.  - Cardiolite (6/22): EF 25%, large primarily fixed inferior/inferolateral defect => suspect prior MI with peri-infarct ischemia.  6. Hyperlipidemia: He has not tolerated any  statin other than lovastatin.   Social History   Socioeconomic History   Marital status: Married    Spouse name: Not on file   Number of children: Not on file   Years of education: Not on file   Highest education level: Not on file  Occupational History   Not on file  Tobacco Use   Smoking status: Never   Smokeless tobacco: Never  Vaping Use   Vaping Use: Never used  Substance and Sexual Activity   Alcohol use: No   Drug use: No   Sexual activity: Yes  Other Topics Concern   Not on file  Social History Narrative   Not on file   Social Determinants of Health   Financial Resource Strain: Not on file  Food Insecurity: Not on file  Transportation Needs: Not on file  Physical Activity: Not on file  Stress: Not on file  Social Connections: Not on file  Intimate Partner Violence: Not on file   Family History  Adopted: Yes  Problem Relation Age of Onset   Heart disease Father    Heart attack Father 63   Hypertension Father    Hyperlipidemia Father    Diabetes Daughter    Hyperlipidemia Daughter    Hypertension Daughter    Diabetes Daughter    Hyperlipidemia Daughter    Hypertension Daughter    Kidney disease Mother    ROS: All systems reviewed and negative except as per HPI.  Current Outpatient Medications  Medication Sig Dispense Refill   aspirin EC 81 MG tablet Take 81 mg by mouth every morning.     Biotin 1000 MCG CHEW Chew 1,000 mcg by mouth at bedtime.     carvedilol (COREG) 6.25 MG tablet Take 1 tablet each morning and 2 tablets each night. 270 tablet 3   cetirizine (ZYRTEC) 10 MG tablet Take 10 mg by mouth at bedtime.     doxazosin (CARDURA) 4 MG tablet Take 4 mg by mouth daily.     Krill Oil 500 MG CAPS Take 500 mg by mouth at bedtime.     lovastatin (MEVACOR) 20 MG tablet Take 20 mg by mouth at bedtime.     omeprazole (PRILOSEC) 20 MG capsule Take 20 mg by mouth every morning.     ONETOUCH VERIO test strip 2 (two) times daily.     sacubitril-valsartan  (ENTRESTO) 49-51 MG Take 1 tablet by mouth 2 (two) times daily. 60 tablet 6   spironolactone (ALDACTONE) 25 MG tablet Take 0.5 tablets (12.5 mg total) by mouth daily. 45 tablet 3   acetaminophen (TYLENOL) 650 MG CR tablet Take 1,300 mg by mouth in the morning and at bedtime.     metFORMIN (GLUCOPHAGE-XR) 500 MG 24 hr tablet Take 500 mg by mouth 2 (two) times daily with breakfast and lunch.     polyvinyl alcohol (LIQUIFILM TEARS) 1.4 % ophthalmic solution Place 1 drop into both eyes 3 (three) times daily as needed for dry eyes.     SUPER B COMPLEX/C PO Take 1 tablet by mouth daily.     No current facility-administered medications for this encounter.   BP 140/80   Pulse 62   Wt 62 kg (136 lb 9.6 oz)   SpO2 98%   BMI 22.05 kg/m  General: NAD Neck: No JVD, no thyromegaly or thyroid nodule.  Lungs: Clear to auscultation bilaterally with normal respiratory effort. CV: Nondisplaced PMI.  Heart regular S1/S2, no S3/S4, no murmur.  No peripheral edema.  No carotid bruit.  Normal pedal pulses.  Abdomen: Soft, nontender, no hepatosplenomegaly, no distention.  Skin: Intact without lesions or rashes.  Neurologic: Alert and oriented x 3.  Psych: Normal affect. Extremities: No clubbing or cyanosis.  HEENT: Normal.   Assessment/Plan: 1. Chronic systolic CHF: Cardiomyopathy of uncertain etiology.  I am concerned that this is an ischemic cardiomyopathy => he has 3 vessel coronary calcification on CT chest from 2/22, Cardiolite in 6/22 was suggestive of inferior/inferolateral infarction with peri-infarct ischemia.  Echo in 5/22 showed EF 20-25% with normal RV, interestingly, the PA systolic pressure estimation was severely elevated at 86 mmHg. He is not volume overloaded on exam.  NYHA class II symptoms.  - Continue Entresto 49/51 bid.  - Continue Coreg 9.375 mg bid.  - Add spironolactone 12.5 mg daily.  BMET/BNP 10 days.  - I will obtain recent BMET from PCP.  - I will arrange for LHC/RHC.  I am  concerned about the markedly elevated PA pressure estimated on echo. He does not look volume overloaded on exam, need to assess hemodynamics.  Strong suspicion for ischemic cardiomyopathy, I will also assess for revascularizable disease with coronary angiography.  Discussed risks/benefits with patient and he agrees to procedure.  2. Carotid stenosis: S/p left CEA.   - Should get carotid dopplers at [facility].  - Will get recent lipids from PCP, goal LDL < 70.  Patient is on lovastatin, says he cannot take other statins.  - Continue ASA  81 daily.  3. HTN: BP mildly elevated.  - Adding spironolactone as above.  4. Snoring/fatigue: Will discuss home sleep study in future.   Marca Ancona 09/06/2020

## 2020-09-06 NOTE — Progress Notes (Signed)
PCP: Irena Reichmann, DO Cardiology: Dr. Allyson Sabal HF Cardiology: Dr. Shirlee Latch  80 y.o. with history of cardiomyopathy of uncertain etiology, carotid stenosis, HTN, type 2 diabetes was referred by Dr. Allyson Sabal for evaluation of CHF.  Patient had left CEA in 4/15.  Cardiolite in 3/15 prior to CEA was normal.  He generally did well until 12/21.  Starting at that time, he had 3 episodes of "pneumonia."  In 2/22, due to respiratory symptoms, he had a CT chest.  This showed 3 vessel coronary calcification and ground glass opacities concerning for pulmonary edema. He saw a pulmonologist, and was thought to have pulmonary edema as a cause of his dyspnea (not a primary pulmonary problem).  He does not smoke.  Echo was done in 5/22 due to ongoing dyspnea and fatigue, showing EF 25% with normal RV, PASP 86 mmHg.  Cardiolite was then done in 6/22, showing EF 25% with evidence for prior inferior/inferolateral MI with peri-infarct ischemia.   Patient has had no severe chest pain episodes though he has episodes of jaw pain, no definite trigger.  The episodes of "pneumonia" over the winter were characterized by cough and dyspnea but not fever.  He fatigues easily.  He does try to walk around his house for 30 minutes/day.  He can climb a flight of stairs but is tired at the top.  No orthopnea/PND.  No lightheadedness. He snores at night.   Labs (4/22): K 3.7, creatinine 1.0  ECG (personally reviewed): NSR, lateral TWIs  PMH:  1. HTN 2. BPH 3. Carotid stenosis: Left CEA in 4/15.  4. Type 2 diabetes 5. Chronic systolic CHF: Cardiomyopathy of uncertain etiology.  - Cardiolite in 3/15 was normal - CT chest in 2/22 with 3 vessel CAD and patchy GGO that may be due to pulmonary edema.  - Echo (5/22): EF 20-25%, normal RV, mild-moderate MR, PASP 86 mmHg.  - Cardiolite (6/22): EF 25%, large primarily fixed inferior/inferolateral defect => suspect prior MI with peri-infarct ischemia.  6. Hyperlipidemia: He has not tolerated any  statin other than lovastatin.   Social History   Socioeconomic History   Marital status: Married    Spouse name: Not on file   Number of children: Not on file   Years of education: Not on file   Highest education level: Not on file  Occupational History   Not on file  Tobacco Use   Smoking status: Never   Smokeless tobacco: Never  Vaping Use   Vaping Use: Never used  Substance and Sexual Activity   Alcohol use: No   Drug use: No   Sexual activity: Yes  Other Topics Concern   Not on file  Social History Narrative   Not on file   Social Determinants of Health   Financial Resource Strain: Not on file  Food Insecurity: Not on file  Transportation Needs: Not on file  Physical Activity: Not on file  Stress: Not on file  Social Connections: Not on file  Intimate Partner Violence: Not on file   Family History  Adopted: Yes  Problem Relation Age of Onset   Heart disease Father    Heart attack Father 63   Hypertension Father    Hyperlipidemia Father    Diabetes Daughter    Hyperlipidemia Daughter    Hypertension Daughter    Diabetes Daughter    Hyperlipidemia Daughter    Hypertension Daughter    Kidney disease Mother    ROS: All systems reviewed and negative except as per HPI.  Current Outpatient Medications  Medication Sig Dispense Refill   aspirin EC 81 MG tablet Take 81 mg by mouth every morning.     Biotin 1000 MCG CHEW Chew 1,000 mcg by mouth at bedtime.     carvedilol (COREG) 6.25 MG tablet Take 1 tablet each morning and 2 tablets each night. 270 tablet 3   cetirizine (ZYRTEC) 10 MG tablet Take 10 mg by mouth at bedtime.     doxazosin (CARDURA) 4 MG tablet Take 4 mg by mouth daily.     Krill Oil 500 MG CAPS Take 500 mg by mouth at bedtime.     lovastatin (MEVACOR) 20 MG tablet Take 20 mg by mouth at bedtime.     omeprazole (PRILOSEC) 20 MG capsule Take 20 mg by mouth every morning.     ONETOUCH VERIO test strip 2 (two) times daily.     sacubitril-valsartan  (ENTRESTO) 49-51 MG Take 1 tablet by mouth 2 (two) times daily. 60 tablet 6   spironolactone (ALDACTONE) 25 MG tablet Take 0.5 tablets (12.5 mg total) by mouth daily. 45 tablet 3   acetaminophen (TYLENOL) 650 MG CR tablet Take 1,300 mg by mouth in the morning and at bedtime.     metFORMIN (GLUCOPHAGE-XR) 500 MG 24 hr tablet Take 500 mg by mouth 2 (two) times daily with breakfast and lunch.     polyvinyl alcohol (LIQUIFILM TEARS) 1.4 % ophthalmic solution Place 1 drop into both eyes 3 (three) times daily as needed for dry eyes.     SUPER B COMPLEX/C PO Take 1 tablet by mouth daily.     No current facility-administered medications for this encounter.   BP 140/80   Pulse 62   Wt 62 kg (136 lb 9.6 oz)   SpO2 98%   BMI 22.05 kg/m  General: NAD Neck: No JVD, no thyromegaly or thyroid nodule.  Lungs: Clear to auscultation bilaterally with normal respiratory effort. CV: Nondisplaced PMI.  Heart regular S1/S2, no S3/S4, no murmur.  No peripheral edema.  No carotid bruit.  Normal pedal pulses.  Abdomen: Soft, nontender, no hepatosplenomegaly, no distention.  Skin: Intact without lesions or rashes.  Neurologic: Alert and oriented x 3.  Psych: Normal affect. Extremities: No clubbing or cyanosis.  HEENT: Normal.   Assessment/Plan: 1. Chronic systolic CHF: Cardiomyopathy of uncertain etiology.  I am concerned that this is an ischemic cardiomyopathy => he has 3 vessel coronary calcification on CT chest from 2/22, Cardiolite in 6/22 was suggestive of inferior/inferolateral infarction with peri-infarct ischemia.  Echo in 5/22 showed EF 20-25% with normal RV, interestingly, the PA systolic pressure estimation was severely elevated at 86 mmHg. He is not volume overloaded on exam.  NYHA class II symptoms.  - Continue Entresto 49/51 bid.  - Continue Coreg 9.375 mg bid.  - Add spironolactone 12.5 mg daily.  BMET/BNP 10 days.  - I will obtain recent BMET from PCP.  - I will arrange for LHC/RHC.  I am  concerned about the markedly elevated PA pressure estimated on echo. He does not look volume overloaded on exam, need to assess hemodynamics.  Strong suspicion for ischemic cardiomyopathy, I will also assess for revascularizable disease with coronary angiography.  Discussed risks/benefits with patient and he agrees to procedure.  2. Carotid stenosis: S/p left CEA.   - [facility] get [facility]pplers at [facility].  - Will get recent lipids from PCP, goal LDL < 70.  Patient is on lovastatin, says he cannot take other statins.  - Continue ASA  81 daily.  3. HTN: BP mildly elevated.  - Adding spironolactone as above.  4. Snoring/fatigue: Will discuss home sleep study in future.   Marca Ancona 09/06/2020

## 2020-09-07 ENCOUNTER — Other Ambulatory Visit (HOSPITAL_COMMUNITY): Payer: Self-pay

## 2020-09-07 DIAGNOSIS — I5020 Unspecified systolic (congestive) heart failure: Secondary | ICD-10-CM

## 2020-09-07 MED ORDER — SODIUM CHLORIDE 0.9% FLUSH: 3.0000 mL | Freq: Two times a day (BID) | INTRAVENOUS | Status: DC

## 2020-09-08 ENCOUNTER — Encounter (HOSPITAL_COMMUNITY): Payer: Self-pay | Admitting: Cardiology

## 2020-09-08 ENCOUNTER — Observation Stay (HOSPITAL_COMMUNITY)
Admission: AD | Admit: 2020-09-08 | Discharge: 2020-09-09 | Disposition: A | Discharge status: Home / Self Care | Payer: PPO | Attending: Cardiology | Admitting: Cardiology

## 2020-09-08 ENCOUNTER — Encounter (HOSPITAL_COMMUNITY)
Admission: AD | Disposition: A | Discharge status: Home / Self Care | Payer: Self-pay | Source: Home / Self Care | Attending: Cardiology

## 2020-09-08 ENCOUNTER — Other Ambulatory Visit: Payer: Self-pay

## 2020-09-08 DIAGNOSIS — Z7984 Long term (current) use of oral hypoglycemic drugs: Secondary | ICD-10-CM | POA: Diagnosis not present

## 2020-09-08 DIAGNOSIS — I110 Hypertensive heart disease with heart failure: Secondary | ICD-10-CM | POA: Diagnosis not present

## 2020-09-08 DIAGNOSIS — Z7982 Long term (current) use of aspirin: Secondary | ICD-10-CM | POA: Insufficient documentation

## 2020-09-08 DIAGNOSIS — I429 Cardiomyopathy, unspecified: Secondary | ICD-10-CM | POA: Diagnosis not present

## 2020-09-08 DIAGNOSIS — E119 Type 2 diabetes mellitus without complications: Secondary | ICD-10-CM | POA: Diagnosis not present

## 2020-09-08 DIAGNOSIS — I5020 Unspecified systolic (congestive) heart failure: Secondary | ICD-10-CM | POA: Insufficient documentation

## 2020-09-08 DIAGNOSIS — Z006 Encounter for examination for normal comparison and control in clinical research program: Secondary | ICD-10-CM

## 2020-09-08 DIAGNOSIS — I340 Nonrheumatic mitral (valve) insufficiency: Secondary | ICD-10-CM | POA: Diagnosis not present

## 2020-09-08 DIAGNOSIS — I2510 Atherosclerotic heart disease of native coronary artery without angina pectoris: Secondary | ICD-10-CM | POA: Diagnosis not present

## 2020-09-08 DIAGNOSIS — Z79899 Other long term (current) drug therapy: Secondary | ICD-10-CM | POA: Insufficient documentation

## 2020-09-08 DIAGNOSIS — I5023 Acute on chronic systolic (congestive) heart failure: Secondary | ICD-10-CM | POA: Diagnosis present

## 2020-09-08 DIAGNOSIS — I5032 Chronic diastolic (congestive) heart failure: Secondary | ICD-10-CM | POA: Diagnosis not present

## 2020-09-08 DIAGNOSIS — I5022 Chronic systolic (congestive) heart failure: Secondary | ICD-10-CM

## 2020-09-08 DIAGNOSIS — I255 Ischemic cardiomyopathy: Secondary | ICD-10-CM | POA: Diagnosis present

## 2020-09-08 DIAGNOSIS — I509 Heart failure, unspecified: Secondary | ICD-10-CM

## 2020-09-08 HISTORY — PX: RIGHT/LEFT HEART CATH AND CORONARY ANGIOGRAPHY: CATH118266

## 2020-09-08 HISTORY — DX: Pneumonia, unspecified organism: J18.9

## 2020-09-08 LAB — POCT I-STAT EG7
Acid-base deficit: 1 mmol/L (ref 0.0–2.0)
Acid-base deficit: 1 mmol/L (ref 0.0–2.0)
Bicarbonate: 24.7 mmol/L (ref 20.0–28.0)
Bicarbonate: 25.4 mmol/L (ref 20.0–28.0)
Calcium, Ion: 1.29 mmol/L (ref 1.15–1.40)
Calcium, Ion: 1.38 mmol/L (ref 1.15–1.40)
HCT: 38 % — ABNORMAL LOW (ref 39.0–52.0)
HCT: 39 % (ref 39.0–52.0)
Hemoglobin: 12.9 g/dL — ABNORMAL LOW (ref 13.0–17.0)
Hemoglobin: 13.3 g/dL (ref 13.0–17.0)
O2 Saturation: 76 %
O2 Saturation: 76 %
Potassium: 3.7 mmol/L (ref 3.5–5.1)
Potassium: 3.9 mmol/L (ref 3.5–5.1)
Sodium: 142 mmol/L (ref 135–145)
Sodium: 142 mmol/L (ref 135–145)
TCO2: 26 mmol/L (ref 22–32)
TCO2: 27 mmol/L (ref 22–32)
pCO2, Ven: 44.4 mmHg (ref 44.0–60.0)
pCO2, Ven: 45.8 mmHg (ref 44.0–60.0)
pH, Ven: 7.353 (ref 7.250–7.430)
pH, Ven: 7.353 (ref 7.250–7.430)
pO2, Ven: 43 mmHg (ref 32.0–45.0)
pO2, Ven: 43 mmHg (ref 32.0–45.0)

## 2020-09-08 LAB — BASIC METABOLIC PANEL
Anion gap: 7 (ref 5–15)
BUN: 17 mg/dL (ref 8–23)
CO2: 25 mmol/L (ref 22–32)
Calcium: 10 mg/dL (ref 8.9–10.3)
Chloride: 104 mmol/L (ref 98–111)
Creatinine, Ser: 0.96 mg/dL (ref 0.61–1.24)
GFR, Estimated: >60 mL/min (ref >60)
Glucose, Bld: 207 mg/dL — ABNORMAL HIGH (ref 70–99)
Potassium: 4.2 mmol/L (ref 3.5–5.1)
Sodium: 136 mmol/L (ref 135–145)

## 2020-09-08 LAB — GLUCOSE, CAPILLARY
Glucose-Capillary: 180 mg/dL — ABNORMAL HIGH (ref 70–99)
Glucose-Capillary: 160 mg/dL — ABNORMAL HIGH (ref 70–99)
Glucose-Capillary: 207 mg/dL — ABNORMAL HIGH (ref 70–99)

## 2020-09-08 LAB — CBC
HCT: 43.3 % (ref 39.0–52.0)
Hemoglobin: 14.4 g/dL (ref 13.0–17.0)
MCH: 31.4 pg (ref 26.0–34.0)
MCHC: 33.3 g/dL (ref 30.0–36.0)
MCV: 94.3 fL (ref 80.0–100.0)
Platelets: 195 10*3/uL (ref 150–400)
RBC: 4.59 MIL/uL (ref 4.22–5.81)
RDW: 14.4 % (ref 11.5–15.5)
WBC: 5.2 10*3/uL (ref 4.0–10.5)
nRBC: 0 % (ref 0.0–0.2)

## 2020-09-08 SURGERY — RIGHT/LEFT HEART CATH AND CORONARY ANGIOGRAPHY
Anesthesia: LOCAL

## 2020-09-08 MED ORDER — ENOXAPARIN SODIUM 40 MG/0.4ML IJ SOSY: 40.0000 mg | PREFILLED_SYRINGE | INTRAMUSCULAR | Status: DC

## 2020-09-08 MED ORDER — INSULIN ASPART 100 UNIT/ML IJ SOLN
0.0000 [IU] | Freq: Every day | INTRAMUSCULAR | Status: DC
  Administered 2020-09-08: 2 [IU] via SUBCUTANEOUS

## 2020-09-08 MED ORDER — LABETALOL HCL 5 MG/ML IV SOLN
10.0000 mg | INTRAVENOUS | Status: AC | PRN
Start: 2020-09-08 — End: 2020-09-09

## 2020-09-08 MED ORDER — ASPIRIN EC 81 MG PO TBEC
81.0000 mg | DELAYED_RELEASE_TABLET | Freq: Every morning | ORAL | Status: DC
  Administered 2020-09-09: 81 mg via ORAL
  Filled 2020-09-08: qty 1

## 2020-09-08 MED ORDER — SODIUM CHLORIDE 0.9% FLUSH
3.0000 mL | INTRAVENOUS | Status: DC | PRN
Start: 2020-09-08 — End: 2020-09-08

## 2020-09-08 MED ORDER — INSULIN ASPART 100 UNIT/ML IJ SOLN
0.0000 [IU] | Freq: Three times a day (TID) | INTRAMUSCULAR | Status: DC
Start: 2020-09-09 — End: 2020-09-09
  Administered 2020-09-09: 2 [IU] via SUBCUTANEOUS
  Administered 2020-09-09: 5 [IU] via SUBCUTANEOUS

## 2020-09-08 MED ORDER — IOHEXOL 350 MG/ML SOLN
INTRAVENOUS | Status: DC | PRN
  Administered 2020-09-08: 90 mL via INTRA_ARTERIAL

## 2020-09-08 MED ORDER — METFORMIN HCL ER 500 MG PO TB24
500.0000 mg | ORAL_TABLET | Freq: Two times a day (BID) | ORAL | Status: DC
  Filled 2020-09-08 (×3): qty 1

## 2020-09-08 MED ORDER — FENTANYL CITRATE (PF) 100 MCG/2ML IJ SOLN
INTRAMUSCULAR | Status: AC
  Filled 2020-09-08: qty 2

## 2020-09-08 MED ORDER — VERAPAMIL HCL 2.5 MG/ML IV SOLN
INTRAVENOUS | Status: AC
  Filled 2020-09-08: qty 2

## 2020-09-08 MED ORDER — VERAPAMIL HCL 2.5 MG/ML IV SOLN
INTRAVENOUS | Status: DC | PRN
  Administered 2020-09-08: 10 mL via INTRA_ARTERIAL

## 2020-09-08 MED ORDER — MIDAZOLAM HCL 2 MG/2ML IJ SOLN
INTRAMUSCULAR | Status: AC
  Filled 2020-09-08: qty 2

## 2020-09-08 MED ORDER — SODIUM CHLORIDE 0.9% FLUSH: 3.0000 mL | Freq: Two times a day (BID) | INTRAVENOUS | Status: DC

## 2020-09-08 MED ORDER — SODIUM CHLORIDE 0.9% FLUSH: 3.0000 mL | INTRAVENOUS | Status: DC | PRN

## 2020-09-08 MED ORDER — SODIUM CHLORIDE 0.9% FLUSH
3.0000 mL | Freq: Two times a day (BID) | INTRAVENOUS | Status: DC
  Administered 2020-09-08 – 2020-09-09 (×2): 3 mL via INTRAVENOUS

## 2020-09-08 MED ORDER — ENOXAPARIN SODIUM 40 MG/0.4ML IJ SOSY
40.0000 mg | PREFILLED_SYRINGE | INTRAMUSCULAR | Status: DC
  Administered 2020-09-09: 40 mg via SUBCUTANEOUS
  Filled 2020-09-08: qty 0.4

## 2020-09-08 MED ORDER — MIDAZOLAM HCL 2 MG/2ML IJ SOLN
INTRAMUSCULAR | Status: DC | PRN
  Administered 2020-09-08: 1 mg via INTRAVENOUS

## 2020-09-08 MED ORDER — ACETAMINOPHEN 325 MG PO TABS
650.0000 mg | ORAL_TABLET | ORAL | Status: DC | PRN
  Filled 2020-09-08: qty 2

## 2020-09-08 MED ORDER — HEPARIN SODIUM (PORCINE) 1000 UNIT/ML IJ SOLN
INTRAMUSCULAR | Status: DC | PRN
  Administered 2020-09-08: 3000 [IU] via INTRAVENOUS

## 2020-09-08 MED ORDER — SODIUM CHLORIDE 0.9 % IV SOLN: 250.0000 mL | INTRAVENOUS | Status: DC | PRN

## 2020-09-08 MED ORDER — FENTANYL CITRATE (PF) 100 MCG/2ML IJ SOLN
INTRAMUSCULAR | Status: DC | PRN
  Administered 2020-09-08: 25 ug via INTRAVENOUS

## 2020-09-08 MED ORDER — HEPARIN (PORCINE) IN NACL 1000-0.9 UT/500ML-% IV SOLN
INTRAVENOUS | Status: AC
  Filled 2020-09-08: qty 1000

## 2020-09-08 MED ORDER — SACUBITRIL-VALSARTAN 49-51 MG PO TABS
1.0000 | ORAL_TABLET | Freq: Two times a day (BID) | ORAL | Status: DC
  Administered 2020-09-08 – 2020-09-09 (×2): 1 via ORAL
  Filled 2020-09-08 (×3): qty 1

## 2020-09-08 MED ORDER — DOXAZOSIN MESYLATE 4 MG PO TABS
4.0000 mg | ORAL_TABLET | Freq: Every day | ORAL | Status: DC
  Administered 2020-09-08 – 2020-09-09 (×2): 4 mg via ORAL
  Filled 2020-09-08 (×2): qty 1

## 2020-09-08 MED ORDER — SODIUM CHLORIDE 0.9 % IV SOLN: INTRAVENOUS | Status: DC

## 2020-09-08 MED ORDER — ONDANSETRON HCL 4 MG/2ML IJ SOLN: 4.0000 mg | Freq: Four times a day (QID) | INTRAMUSCULAR | Status: DC | PRN

## 2020-09-08 MED ORDER — SODIUM CHLORIDE 0.9 % IV SOLN: INTRAVENOUS | Status: AC

## 2020-09-08 MED ORDER — HEPARIN (PORCINE) IN NACL 1000-0.9 UT/500ML-% IV SOLN
INTRAVENOUS | Status: DC | PRN
  Administered 2020-09-08 (×2): 500 mL

## 2020-09-08 MED ORDER — CARVEDILOL 6.25 MG PO TABS
6.2500 mg | ORAL_TABLET | Freq: Two times a day (BID) | ORAL | Status: DC
  Administered 2020-09-09: 6.25 mg via ORAL
  Filled 2020-09-08: qty 1

## 2020-09-08 MED ORDER — LIDOCAINE HCL (PF) 1 % IJ SOLN
INTRAMUSCULAR | Status: DC | PRN
  Administered 2020-09-08: 5 mL via SUBCUTANEOUS
  Administered 2020-09-08: 2 mL via SUBCUTANEOUS

## 2020-09-08 MED ORDER — POLYVINYL ALCOHOL 1.4 % OP SOLN
1.0000 [drp] | Freq: Three times a day (TID) | OPHTHALMIC | Status: DC | PRN
  Filled 2020-09-08: qty 15

## 2020-09-08 MED ORDER — HEPARIN SODIUM (PORCINE) 1000 UNIT/ML IJ SOLN
INTRAMUSCULAR | Status: AC
  Filled 2020-09-08: qty 1

## 2020-09-08 MED ORDER — ASPIRIN 81 MG PO CHEW: 81.0000 mg | CHEWABLE_TABLET | ORAL | Status: DC

## 2020-09-08 MED ORDER — ACETAMINOPHEN 325 MG PO TABS
650.0000 mg | ORAL_TABLET | ORAL | Status: DC | PRN
Start: 2020-09-08 — End: 2020-09-09
  Administered 2020-09-08 – 2020-09-09 (×3): 650 mg via ORAL
  Filled 2020-09-08 (×2): qty 2

## 2020-09-08 MED ORDER — HYDRALAZINE HCL 20 MG/ML IJ SOLN: 10.0000 mg | INTRAMUSCULAR | Status: AC | PRN

## 2020-09-08 MED ORDER — LIDOCAINE HCL (PF) 1 % IJ SOLN
INTRAMUSCULAR | Status: AC
  Filled 2020-09-08: qty 30

## 2020-09-08 SURGICAL SUPPLY — 13 items
CATH 5FR JL3.5 JR4 ANG PIG MP (CATHETERS) ×1 IMPLANT
CATH BALLN WEDGE 5F 110CM (CATHETERS) ×1 IMPLANT
CATH LAUNCHER 5F JL3 (CATHETERS) IMPLANT
CATHETER LAUNCHER 5F JL3 (CATHETERS) ×2
DEVICE RAD COMP TR BAND LRG (VASCULAR PRODUCTS) ×1 IMPLANT
GLIDESHEATH SLEND SS 6F .021 (SHEATH) ×1 IMPLANT
GUIDEWIRE .025 260CM (WIRE) ×1 IMPLANT
GUIDEWIRE INQWIRE 1.5J.035X260 (WIRE) IMPLANT
INQWIRE 1.5J .035X260CM (WIRE) ×2
KIT HEART LEFT (KITS) ×2 IMPLANT
PACK CARDIAC CATHETERIZATION (CUSTOM PROCEDURE TRAY) ×2 IMPLANT
SHEATH GLIDE SLENDER 4/5FR (SHEATH) ×1 IMPLANT
TRANSDUCER W/STOPCOCK (MISCELLANEOUS) ×2 IMPLANT

## 2020-09-08 NOTE — Consult Note (Addendum)
[address]       [place] [address]             [phone]        David Sellers [facility] Medical Record #[id number] Date of Birth: 1940/03/22  Referring: No ref. provider found Primary Care: Irena Reichmann, DO Primary Cardiologist:None  Chief Complaint:   No chief complaint on file.   History of Present Illness:      David Sellers is a pleasant 80 year old male patient with past medical history significant for cardiomyopathy, hypertension, diabetes type 2, carotid stenosis, and hyperlipidemia who presented for a cardiac catheterization today.  He has had 3 episodes of pneumonia since February of this year and due to respiratory symptoms he had a CT of the chest.  This showed three-vessel coronary artery calcification and groundglass opacities concerning for pulmonary edema.  He was seen by pulmonologist and was thought to have pulmonary edema as a cause of his dyspnea.  He does not smoke and has never smoked.  He has never been diagnosed with COPD.  He did have an echocardiogram May 2022 which showed an EF of 20-25% with normal RV function.  He also had mild to moderate mitral valve regurgitation and mild to moderate tricuspid valve regurgitation.  He has been followed by Dr. Shirlee Latch and was last seen on 09/04/2020.  Today, he underwent cardiac catheterization which showed a proximal right coronary lesion of 80% stenosis, mid right coronary lesion of 95% stenosis, ostial circumflex lesion of 100% stenosis, ostial left main lesion of 80% stenosis, mid LAD lesion of 60% stenosis, first diagonal lesion of 85% stenosis, and distal LAD lesion of 70% stenosis.  Due to the severity of his coronary artery disease and multiple vessels with stenosis we have been consulted for possible surgical revascularization.  [name] lives in [place] with his wife and daughter who they take care of.  He has been moderately active at home walking a few times a day around his house as instructed by  his endocrinologist.  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  His daughter does have multiple medical problems but is able to provide transportation for him.   Current Activity/ Functional Status: Patient was independent with mobility/ambulation, transfers, ADL's, IADL's.   Zubrod Score: At the time of surgery this patient's most appropriate activity status/level should be described as: []     0    Normal activity, no symptoms [x]     1    Restricted in physical strenuous activity but ambulatory, able to do out light work []     2    Ambulatory and capable of self care, unable to do work activities, up and about                 more than 50%  Of the time                            []     3    Only limited self care, in bed greater than 50% of waking hours []     4    Completely disabled, no self care, confined to bed or chair []     5    Moribund  Past Medical History:  Diagnosis Date   Arthritis    Asthma    only has flareup with smelly perfume    BPH (benign prostatic hyperplasia)    Carotid artery  occlusion    right ICA 50% stenosis   Carotodynia    Cataract    immature to the right eye   Diabetes mellitus without complication (HCC)    borderline   GERD (gastroesophageal reflux disease)    takes Omeprazole daily   Headache(784.0)    r/t arthritis in neck    Hemorrhoids    History of bronchitis    last time 10-3389yrs ago   History of colon polyps    Hyperlipidemia    no meds since Jan 1   Hypertension    takes Verapamil,Lisinopril,and Cardura daily   Joint pain    Legally blind in left eye, [place]    hit by a baseball   Low back pain    reason unknown   Prolapsed hemorrhoids    Urinary frequency    sees Dr.Tannenbaum    Past Surgical History:  Procedure Laterality Date   CAROTID ENDARTERECTOMY Left 05/08/13   COLONOSCOPY     EGD with dilitation     ENDARTERECTOMY Left 05/08/2013   Procedure: LEFT CAROTID ARTERY ENDARTERECTOMY WITH DACRON PATCH ANGIOPLASTY;   Surgeon: Sherren Kernsharles E Fields, MD;  Location: [facility] OR;  Service: Vascular;  Laterality: Left;   EYE SURGERY     HEMORRHOID SURGERY N/A 08/13/2019   Procedure: OPEN HEMORRHOIDECTOMY;  Surgeon: Darnell LevelGerkin, Todd, MD;  Location: [facility];  Service: General;  Laterality: N/A;  LMA   HERNIA REPAIR     x 2   RETINAL DETACHMENT SURGERY     retinal laser surgery Left    plate placed    Social History   Tobacco Use  Smoking Status Never  Smokeless Tobacco Never    Social History   Substance and Sexual Activity  Alcohol Use No     Allergies  Allergen Reactions   Albuterol Anaphylaxis    Per patient and daughter   Meloxicam Itching, Palpitations and Other (See Comments)    Headaches, Mood swings.   Morphine And Related Nausea And Vomiting   Statins Other (See Comments)    Myalgias. Severe arthritic response. Tolerates Lovastatin.    Cholestoff [Plant Sterols And Stanols] Other (See Comments)    Muscle aches   Nsaids Other (See Comments)    Aleve ,  Myalgia   Cortisone Hypertension    Patient states cortisone injections he has received for his knees cause hypertension and hyperglycemia   Darvon [Propoxyphene] Other (See Comments)    hallucinate   Doxycycline     Other reaction(s): Coughing   Glimepiride Itching   Hydrochlorothiazide     Other reaction(s): Low Blood Pressure   Indomethacin     Neck pain flare   Penicillins     *positive allergy test*   Sulfa Antibiotics Nausea And Vomiting   Terbinafine     Other reaction(s): diarrhea   Tramadol Other (See Comments)    Hallucinations     Current Facility-Administered Medications  Medication Dose Route Frequency Provider Last Rate Last Admin   0.9 %  sodium chloride infusion  250 mL Intravenous PRN Laurey MoraleMcLean, Dalton S, MD       0.9 %  sodium chloride infusion   Intravenous Continuous Laurey MoraleMcLean, Dalton S, MD 75 mL/hr at 09/08/20 1345 Rate Change at 09/08/20 1345   0.9 %  sodium chloride infusion   Intravenous Continuous  Laurey MoraleMcLean, Dalton S, MD 75 mL/hr at 09/08/20 1446 Rate Change at 09/08/20 1446   [START ON 09/09/2020] aspirin chewable tablet 81 mg  81 mg  Oral Pre-Cath Laurey Morale, MD       sodium chloride flush (NS) 0.9 % injection 3 mL  3 mL Intravenous PRN Laurey Morale, MD        Medications Prior to Admission  Medication Sig Dispense Refill Last Dose   acetaminophen (TYLENOL) 650 MG CR tablet Take 1,300 mg by mouth in the morning and at bedtime.   09/07/2020 at 2100   aspirin EC 81 MG tablet Take 81 mg by mouth every morning.   09/08/2020 at 0500   Biotin 1000 MCG CHEW Chew 1,000 mcg by mouth at bedtime.   09/07/2020 at 2100   carvedilol (COREG) 6.25 MG tablet Take 1 tablet each morning and 2 tablets each night. 270 tablet 3 09/08/2020 at 0500   cetirizine (ZYRTEC) 10 MG tablet Take 10 mg by mouth at bedtime.   09/07/2020 at 2100   doxazosin (CARDURA) 4 MG tablet Take 4 mg by mouth daily.   09/07/2020 at 2100   Krill Oil 500 MG CAPS Take 500 mg by mouth at bedtime.   09/07/2020 at 2100   lovastatin (MEVACOR) 20 MG tablet Take 20 mg by mouth at bedtime.   09/07/2020 at 2100   metFORMIN (GLUCOPHAGE-XR) 500 MG 24 hr tablet Take 500 mg by mouth 2 (two) times daily with breakfast and lunch.   09/06/2020   omeprazole (PRILOSEC) 20 MG capsule Take 20 mg by mouth every morning.   09/08/2020 at 0500   [facility] VERIO test strip 2 (two) times daily.   09/07/2020 at 2100   polyvinyl alcohol (LIQUIFILM TEARS) 1.4 % ophthalmic solution Place 1 drop into both eyes 3 (three) times daily as needed for dry eyes.   Past Week   sacubitril-valsartan (ENTRESTO) 49-51 MG Take 1 tablet by mouth 2 (two) times daily. 60 tablet 6 09/08/2020 at 0500   spironolactone (ALDACTONE) 25 MG tablet Take 0.5 tablets (12.5 mg total) by mouth daily. 45 tablet 3 09/07/2020 at 2100   SUPER B COMPLEX/C PO Take 1 tablet by mouth daily.   09/07/2020 at 2100    Family History  Adopted: Yes  Problem Relation Age of Onset   Heart disease Father    Heart attack Father  44   Hypertension Father    Hyperlipidemia Father    Diabetes Daughter    Hyperlipidemia Daughter    Hypertension Daughter    Diabetes Daughter    Hyperlipidemia Daughter    Hypertension Daughter    Kidney disease Mother      Review of Systems:   Review of Systems  Constitutional:  Positive for malaise/fatigue and weight loss.  Respiratory:  Positive for cough (recently resolved). Negative for shortness of breath.   Cardiovascular:  Negative for chest pain and leg swelling.  Gastrointestinal:  Positive for heartburn. Negative for abdominal pain.  Musculoskeletal:  Positive for joint pain.  Pertinent items are noted in HPI.     Physical Exam: BP (!) 172/73   Pulse 73   Temp 98.2 F (36.8 C)   Resp 16   Ht 5\' 6"  (1.676 m)   Wt 60.3 kg   SpO2 100%   BMI 21.47 kg/m    General appearance: alert, cooperative, and no distress Resp: clear to auscultation bilaterally Cardio: regular rate and rhythm, S1, S2 normal, no murmur, click, rub or gallop GI: soft, non-tender; bowel sounds normal; no masses,  no organomegaly Extremities: extremities normal, atraumatic, no cyanosis or edema Neurologic: Grossly normal  Diagnostic Studies & Laboratory data:  Recent Radiology Findings:   CARDIAC CATHETERIZATION  Result Date: 09/08/2020 Formatting of this result is different from the original.   Prox RCA lesion is 80% stenosed.   Mid RCA lesion is 95% stenosed.   Ost Cx lesion is 100% stenosed.   Ost LM lesion is 80% stenosed.   Mid LAD lesion is 60% stenosed.   1st Diag lesion is 95% stenosed.   Dist LAD lesion is 70% stenosed. 1. Low filling pressures. 2. Preserved cardiac output. 3. Severe coronary disease with critical mid RCA stenosis, occluded ostial LCx with collaterals from PDA, and 80% left main stenosis with moderate LAD disease. I will admit the patient for consultation for CABG.  This is complicated by ischemic cardiomyopathy, though he is well-compensated.     I have  independently reviewed the above radiologic studies and discussed with the patient   Recent Lab Findings: Lab Results  Component Value Date   WBC 5.2 09/08/2020   HGB 14.4 09/08/2020   HCT 43.3 09/08/2020   PLT 195 09/08/2020   GLUCOSE 207 (H) 09/08/2020   ALT 27 05/06/2013   AST 21 05/06/2013   NA 136 09/08/2020   K 4.2 09/08/2020   CL 104 09/08/2020   CREATININE 0.96 09/08/2020   BUN 17 09/08/2020   CO2 25 09/08/2020   INR 0.94 05/06/2013      Assessment / Plan:      Multivessel coronary artery disease-currently on baby aspirin. Work-up for possible CABG  Hypertension-he was taking Coreg, Entresto, Cardura and spironolactone at home Chronic systolic CHF of uncertain etiology.  It is possible that it is ischemic cardiomyopathy.  Echocardiogram from May 2022 showed EF of 20 to 25%.  He has been under the care of the heart failure service and is currently on Coreg, Entresto, and spironolactone Carotid stenosis status post left CEA-patient is on lovastatin, he had muscle aches on other statins.  He will need preop ultrasound of his carotids Diabetes mellitus type 2-he is on metformin at home but this is currently being held.  Continue insulin coverage. Urinary retention due to BPH-continue Cardura  Plan: I reviewed coronary artery bypass grafting with the patient and answered all questions to the patient's satisfaction.  He did want to discuss surgery with his wife but he seems like he would want to go ahead with surgery.  I think he would be at moderate risk due to his low EF, age, and other comorbidities.  We did discuss the possible need of inpatient cardiac rehab postop.  I  spent 30 minutes counseling the patient face to face.  Jari Favre, [name] 09/08/2020 3:31 PM   This is an 80 year old gentleman this admitted following an elective left heart cath.  He was found to have severe left main disease as well as significant disease in the right coronary system.  His echocardiogram  from May of this year shows that he has an ejection fraction of 20 to 25%.  He also has mild to moderate mitral valve regurgitation.  From a symptomatic standpoint he denies any anginal symptoms and also complains of heart failure symptoms.  There is a question as to whether or not he has any viable myocardium, and is scheduled to undergo a cardiac MRI tomorrow for further assessment.  Once this is completed we will have a deep discussion is whether or not surgical revascularization is a good option given his good functional capacity at this point.  Jimmey Hengel Keane Scrape

## 2020-09-08 NOTE — H&P (Addendum)
Advanced Heart Failure Team History and Physical Note   PCP:  Irena Reichmann, DO  HF Cardiology: Dr Shirlee Latch   Reason for Admission: Heart Failure/ CAD   HPI:    Mr Obeso is an 80 y.o. with history of cardiomyopathy of uncertain etiology, carotid stenosis, HTN, type 2 diabetes was referred by Dr. Allyson Sabal for evaluation of CHF.  Patient had left CEA in 4/15.  Cardiolite in 3/15 prior to CEA was normal.  He generally did well until 12/21.  Starting at that time, he had 3 episodes of "pneumonia."  In 2/22, due to respiratory symptoms, he had a CT chest.  This showed 3 vessel coronary calcification and ground glass opacities concerning for pulmonary edema. He saw a pulmonologist, and was thought to have pulmonary edema as a cause of his dyspnea (not a primary pulmonary problem).  He does not smoke.  Echo was done in 5/22 due to ongoing dyspnea and fatigue, showing EF 25% with normal RV, PASP 86 mmHg.  Cardiolite was then done in 6/22, showing EF 25% with evidence for prior inferior/inferolateral MI with peri-infarct ischemia.  Followed by Dr Shirlee Latch and was last 09/04/20. Coreg increased and spiro added. Also set up for cath to further evaluate LV dysfunction.   Presented today for cath and this showed severe coronary disease with critical RCA stenosis, occluded ostial LCX, L main disease, and preserved cardiac output.   CT surgery consulted.    RHC/LHC Today  RA 1 PA 19/8 PCWP 1 PA 76% CO 4.8 CI 2.8   PMH 1. HTN 2. BPH 3. Carotid stenosis: Left CEA in 4/15. 4. Type 2 diabetes 5. Chronic systolic CHF: Cardiomyopathy of uncertain etiology. - Cardiolite in 3/15 was normal - CT chest in 2/22 with 3 vessel CAD and patchy GGO that may be due to pulmonary edema. - Echo (5/22): EF 20-25%, normal RV, mild-moderate MR, PASP 86 mmHg. - Cardiolite (6/22): EF 25%, large primarily fixed inferior/inferolateral defect => suspect prior MI with peri-infarct ischemia. 6. Hyperlipidemia: He has not  tolerated any statin other than lovastatin.  Review of Systems: [y] = yes, [ ]  = no   General: Weight gain [ ] ; Weight loss [ ] ; Anorexia [ ] ; Fatigue [ Y]; Fever [ ] ; Chills [ ] ; Weakness [ Y ]  Cardiac: Chest pain/pressure [ ] ; Resting SOB [ ] ; Exertional SOB [ ] ; Orthopnea [ ] ; Pedal Edema [ ] ; Palpitations [ ] ; Syncope [ ] ; Presyncope [ ] ; Paroxysmal nocturnal dyspnea[ ]   Pulmonary: Cough [ ] ; Wheezing[ ] ; Hemoptysis[ ] ; Sputum [ ] ; Snoring [ ]   GI: Vomiting[ ] ; Dysphagia[ ] ; Melena[ ] ; Hematochezia [ ] ; Heartburn[ ] ; Abdominal pain [ ] ; Constipation [ ] ; Diarrhea [ ] ; BRBPR [ ]   GU: Hematuria[ ] ; Dysuria [ ] ; Nocturia[ ]   Vascular: Pain in legs with walking [ ] ; Pain in feet with lying flat [ ] ; Non-healing sores [ ] ; Stroke [ ] ; TIA [ ] ; Slurred speech [ ] ;  Neuro: Headaches[ ] ; Vertigo[ ] ; Seizures[ ] ; Paresthesias[ ] ;Blurred vision [ ] ; Diplopia [ ] ; Vision changes [ ]   Ortho/Skin: Arthritis [ ] ; Joint pain [ Y]; Muscle pain [ ] ; Joint swelling [ ] ; Back Pain [ ] ; Rash [ ]   Psych: Depression[ ] ; Anxiety[ ]   Heme: Bleeding problems [ ] ; Clotting disorders [ ] ; Anemia [ ]   Endocrine: Diabetes [ Y]; Thyroid dysfunction[ ]    Home Medications Prior to Admission medications   Medication Sig Start Date End Date Taking? Authorizing Provider  acetaminophen (TYLENOL) 650 MG CR tablet Take 1,300 mg by mouth in the morning and at bedtime.   Yes [provider]  aspirin EC 81 MG tablet Take 81 mg by mouth every morning.   Yes [provider]  Biotin 1000 MCG CHEW Chew 1,000 mcg by mouth at bedtime.   Yes [provider]  carvedilol (COREG) 6.25 MG tablet Take 1 tablet each morning and 2 tablets each night. 08/13/20  Yes Runell Gess, MD  cetirizine (ZYRTEC) 10 MG tablet Take 10 mg by mouth at bedtime.   Yes [provider]  doxazosin (CARDURA) 4 MG tablet Take 4 mg by mouth daily.   Yes [provider]  Boris Lown Oil 500 MG CAPS Take 500 mg by mouth at  bedtime.   Yes [provider]  lovastatin (MEVACOR) 20 MG tablet Take 20 mg by mouth at bedtime.   Yes [provider]  metFORMIN (GLUCOPHAGE-XR) 500 MG 24 hr tablet Take 500 mg by mouth 2 (two) times daily with breakfast and lunch. 07/22/20  Yes [provider]  omeprazole (PRILOSEC) 20 MG capsule Take 20 mg by mouth every morning.   Yes [provider]  [facility] VERIO test strip 2 (two) times daily. 10/07/19  Yes [provider]  polyvinyl alcohol (LIQUIFILM TEARS) 1.4 % ophthalmic solution Place 1 drop into both eyes 3 (three) times daily as needed for dry eyes.   Yes [provider]  sacubitril-valsartan (ENTRESTO) 49-51 MG Take 1 tablet by mouth 2 (two) times daily. 08/13/20  Yes Runell Gess, MD  spironolactone (ALDACTONE) 25 MG tablet Take 0.5 tablets (12.5 mg total) by mouth daily. 09/04/20  Yes Laurey Morale, MD  SUPER B COMPLEX/C PO Take 1 tablet by mouth daily.   Yes [provider]    Past Medical History: Past Medical History:  Diagnosis Date   Arthritis    Asthma    only has flareup with smelly perfume    BPH (benign prostatic hyperplasia)    Carotid artery occlusion    right ICA 50% stenosis   Carotodynia    Cataract    immature to the right eye   Diabetes mellitus without complication (HCC)    borderline   GERD (gastroesophageal reflux disease)    takes Omeprazole daily   Headache(784.0)    r/t arthritis in neck    Hemorrhoids    History of bronchitis    last time 10-25yrs ago   History of colon polyps    Hyperlipidemia    no meds since Jan 1   Hypertension    takes Verapamil,Lisinopril,and Cardura daily   Joint pain    Legally blind in left eye, as defined in [place]    hit by a baseball   Low back pain    reason unknown   Prolapsed hemorrhoids    Urinary frequency    sees Dr.Tannenbaum    Past Surgical History: Past Surgical History:  Procedure Laterality Date   CAROTID ENDARTERECTOMY  Left 05/08/13   COLONOSCOPY     EGD with dilitation     ENDARTERECTOMY Left 05/08/2013   Procedure: LEFT CAROTID ARTERY ENDARTERECTOMY WITH DACRON PATCH ANGIOPLASTY;  Surgeon: Sherren Kerns, MD;  Location: [facility] OR;  Service: Vascular;  Laterality: Left;   EYE SURGERY     HEMORRHOID SURGERY N/A 08/13/2019   Procedure: OPEN HEMORRHOIDECTOMY;  Surgeon: Darnell Level, MD;  Location:  SURGERY CENTER;  Service: General;  Laterality: N/A;  LMA  HERNIA REPAIR     x 2   RETINAL DETACHMENT SURGERY     retinal laser surgery Left    plate placed    Family History:   Family History  Adopted: Yes  Problem Relation Age of Onset   Heart disease Father    Heart attack Father 50   Hypertension Father    Hyperlipidemia Father    Diabetes Daughter    Hyperlipidemia Daughter    Hypertension Daughter    Diabetes Daughter    Hyperlipidemia Daughter    Hypertension Daughter    Kidney disease Mother     Social History: Social History   Socioeconomic History   Marital status: Married    Spouse name: Not on file   Number of children: Not on file   Years of education: Not on file   Highest education level: Not on file  Occupational History   Not on file  Tobacco Use   Smoking status: Never   Smokeless tobacco: Never  Vaping Use   Vaping Use: Never used  Substance and Sexual Activity   Alcohol use: No   Drug use: No   Sexual activity: Yes  Other Topics Concern   Not on file  Social History Narrative   Not on file   Social Determinants of Health   Financial Resource Strain: Not on file  Food Insecurity: Not on file  Transportation Needs: Not on file  Physical Activity: Not on file  Stress: Not on file  Social Connections: Not on file    Allergies:  Allergies  Allergen Reactions   Albuterol Anaphylaxis    Per patient and daughter   Meloxicam Itching, Palpitations and Other (See Comments)    Headaches, Mood swings.   Morphine And Related Nausea And Vomiting   Statins  Other (See Comments)    Myalgias. Severe arthritic response. Tolerates Lovastatin.    Cholestoff [Plant Sterols And Stanols] Other (See Comments)    Muscle aches   Nsaids Other (See Comments)    Aleve ,  Myalgia   Cortisone Hypertension    Patient states cortisone injections he has received for his knees cause hypertension and hyperglycemia   Darvon [Propoxyphene] Other (See Comments)    hallucinate   Doxycycline     Other reaction(s): Coughing   Glimepiride Itching   Hydrochlorothiazide     Other reaction(s): Low Blood Pressure   Indomethacin     Neck pain flare   Penicillins     *positive allergy test*   Sulfa Antibiotics Nausea And Vomiting   Terbinafine     Other reaction(s): diarrhea   Tramadol Other (See Comments)    Hallucinations     Objective:    Vital Signs:   Temp:  [98.2 F (36.8 C)] 98.2 F (36.8 C) (08/02 1200) Pulse Rate:  [65-73] 73 (08/02 1428) Resp:  [16] 16 (08/02 1428) BP: (165-172)/(73-75) 172/73 (08/02 1428) SpO2:  [100 %] 100 % (08/02 1316) Weight:  [60.3 kg] 60.3 kg (08/02 1200)   Filed Weights   09/08/20 1200  Weight: 60.3 kg     Physical Exam     General:  Well appearing. No respiratory difficulty HEENT: Normal Neck: Supple. no JVD. Carotids 2+ bilat; no bruits. No lymphadenopathy or thyromegaly appreciated. Cor: PMI nondisplaced. Regular rate & rhythm. No rubs, gallops or murmurs. Lungs: Clear Abdomen: Soft, nontender, nondistended. No hepatosplenomegaly. No bruits or masses. Good bowel sounds. Extremities: No cyanosis, clubbing, rash, edema Neuro: Alert & oriented x 3, cranial nerves grossly intact.  moves all 4 extremities w/o difficulty. Affect pleasant.   Telemetry  SR   EKG   N/A   Labs     Basic Metabolic Panel: Recent Labs  Lab 09/08/20 1127  NA 136  K 4.2  CL 104  CO2 25  GLUCOSE 207*  BUN 17  CREATININE 0.96  CALCIUM 10.0    Liver Function Tests: No results for input(s): AST, ALT, ALKPHOS, BILITOT,  PROT, ALBUMIN in the last 168 hours. No results for input(s): LIPASE, AMYLASE in the last 168 hours. No results for input(s): AMMONIA in the last 168 hours.  CBC: Recent Labs  Lab 09/08/20 1127  WBC 5.2  HGB 14.4  HCT 43.3  MCV 94.3  PLT 195    Cardiac Enzymes: No results for input(s): CKTOTAL, CKMB, CKMBINDEX, TROPONINI in the last 168 hours.  BNP: BNP (last 3 results) No results for input(s): BNP in the last 8760 hours.  ProBNP (last 3 results) No results for input(s): PROBNP in the last 8760 hours.   CBG: Recent Labs  Lab 09/08/20 1259 09/08/20 1438  GLUCAP 180* 160*    Coagulation Studies: No results for input(s): LABPROT, INR in the last 72 hours.  Imaging: CARDIAC CATHETERIZATION  Result Date: 09/08/2020 Formatting of this result is different from the original.   Prox RCA lesion is 80% stenosed.   Mid RCA lesion is 95% stenosed.   Ost Cx lesion is 100% stenosed.   Ost LM lesion is 80% stenosed.   Mid LAD lesion is 60% stenosed.   1st Diag lesion is 95% stenosed.   Dist LAD lesion is 70% stenosed. 1. Low filling pressures. 2. Preserved cardiac output. 3. Severe coronary disease with critical mid RCA stenosis, occluded ostial LCx with collaterals from PDA, and 80% left main stenosis with moderate LAD disease. I will admit the patient for consultation for CABG.  This is complicated by ischemic cardiomyopathy, though he is well-compensated.      Assessment/Plan   CAD Cath today with severe coronary disease with critical RCA stenosis, occluded ostial LCX, L main disease, and preserved cardiac output.  CT surgery consulted for possible CABG 2.  Chronic systolic CHF: Cardiomyopathy of uncertain etiology.  I am concerned that this is an ischemic cardiomyopathy => he has 3 vessel coronary calcification on CT chest from 2/22, Cardiolite in 6/22 was suggestive of inferior/inferolateral infarction with peri-infarct ischemia.  Echo in 5/22 showed EF 20-25% with normal RV,  interestingly, the PA systolic pressure estimation was severely elevated at 86 mmHg.  Cath today with low filling pressure, preserved cardiac output, and severe CAD.  - Continue Entresto 49/51 bid. - Continue Coreg 9.375 mg bid. - Continue spironolactone 12.5 mg daily.   3. Carotid stenosis: S/p left CEA.   - Should get carotid dopplers at [facility]. - Will get recent lipids from PCP, goal LDL < 70.   -Patient is on lovastatin, says he cannot take other statins. - Continue ASA 81 daily. 4. HTN: BP mildly elevated. - Adding spironolactone as above. 5. Snoring/fatigue: Will discuss home sleep study in future.     CT surgery consulted today.   Tonye Becket, NP 09/08/2020, 2:49 PM  Advanced Heart Failure Team Pager [phone] (M-F; 7a - 5p)  Please contact [facility] for night-coverage after hours (4p -7a ) and weekends on [email or web address]  Patient seen with NP, agree with the above note.   Cath today with severe 3 vessel disease including left main 80% ostium.  RHC showed low filling pressures  and preserved cardiac output.  He was admitted post-cath for TCTS evaluation.   General: NAD Neck: No JVD, no thyromegaly or thyroid nodule.  Lungs: Clear to auscultation bilaterally with normal respiratory effort. CV: Nondisplaced PMI.  Heart regular S1/S2, no S3/S4, no murmur.  No peripheral edema.  No carotid bruit.  Normal pedal pulses.  Abdomen: Soft, nontender, no hepatosplenomegaly, no distention.  Skin: Intact without lesions or rashes.  Neurologic: Alert and oriented x 3.  Psych: Normal affect. Extremities: No clubbing or cyanosis.  HEENT: Normal.   Admission for CABG evaluation.  CHF is well-compensated but high risk CABG due to low EF (20-25% on last echo).  Suspect ischemic cardiomyopathy.  Would be difficult candidate for percutaneous intervention, reviewed with Dr. Allyson SabalBerry.  He will continue his current outpatient meds.  Gentle hydration post-cath.  Cardiac MRI for viability assessment and  reassess RV/LV function.   Marca AnconaDalton Ilia Dimaano 09/08/2020 3:37 PM

## 2020-09-08 NOTE — Research (Signed)
IDENTIFY Informed Consent                  Subject Name: David Sellers   Subject met inclusion and exclusion criteria.  The informed consent form, study requirements and expectations were reviewed with the subject and questions and concerns were addressed prior to the signing of the consent form.  The subject verbalized understanding of the trial requirements.  The subject agreed to participate in the IDENTIFY trial and signed the informed consent at 11:56AM on 09/08/20.  The informed consent was obtained prior to performance of any protocol-specific procedures for the subject.  A copy of the signed informed consent was given to the subject and a copy was placed in the subject's medical record.    Brenton Grills , Research Assistant

## 2020-09-08 NOTE — Progress Notes (Signed)
TR BAND REMOVAL  LOCATION:    right radial  DEFLATED PER PROTOCOL:    Yes.    TIME BAND OFF / DRESSING APPLIED:    1720   SITE UPON ARRIVAL:    Level 0  SITE AFTER BAND REMOVAL:    Level 0  CIRCULATION SENSATION AND MOVEMENT:    Within Normal Limits   Yes.    COMMENTS:   R wrist is level zero, with capillary refill < 3 sec.

## 2020-09-08 NOTE — Interval H&P Note (Signed)
History and Physical Interval Note:  09/08/2020 1:22 PM  David Sellers  has presented today for surgery, with the diagnosis of CHF.  The various methods of treatment have been discussed with the patient and family. After consideration of risks, benefits and other options for treatment, the patient has consented to  Procedure(s): RIGHT/LEFT HEART CATH AND CORONARY ANGIOGRAPHY (N/A) as a surgical intervention.  The patient's history has been reviewed, patient examined, no change in status, stable for surgery.  I have reviewed the patient's chart and labs.  Questions were answered to the patient's satisfaction.     Isaiah Torok [employer]

## 2020-09-09 ENCOUNTER — Inpatient Hospital Stay (HOSPITAL_COMMUNITY): Payer: PPO

## 2020-09-09 ENCOUNTER — Encounter (HOSPITAL_COMMUNITY): Payer: Self-pay | Admitting: Cardiology

## 2020-09-09 DIAGNOSIS — I2510 Atherosclerotic heart disease of native coronary artery without angina pectoris: Secondary | ICD-10-CM | POA: Diagnosis not present

## 2020-09-09 DIAGNOSIS — I5022 Chronic systolic (congestive) heart failure: Secondary | ICD-10-CM | POA: Diagnosis not present

## 2020-09-09 DIAGNOSIS — I5023 Acute on chronic systolic (congestive) heart failure: Secondary | ICD-10-CM | POA: Diagnosis not present

## 2020-09-09 DIAGNOSIS — I509 Heart failure, unspecified: Secondary | ICD-10-CM

## 2020-09-09 LAB — BASIC METABOLIC PANEL
Anion gap: 9 (ref 5–15)
BUN: 19 mg/dL (ref 8–23)
CO2: 24 mmol/L (ref 22–32)
Calcium: 9.2 mg/dL (ref 8.9–10.3)
Chloride: 104 mmol/L (ref 98–111)
Creatinine, Ser: 1.01 mg/dL (ref 0.61–1.24)
GFR, Estimated: >60 mL/min (ref >60)
Glucose, Bld: 131 mg/dL — ABNORMAL HIGH (ref 70–99)
Potassium: 3.5 mmol/L (ref 3.5–5.1)
Sodium: 137 mmol/L (ref 135–145)

## 2020-09-09 LAB — GLUCOSE, CAPILLARY
Glucose-Capillary: 144 mg/dL — ABNORMAL HIGH (ref 70–99)
Glucose-Capillary: 199 mg/dL — ABNORMAL HIGH (ref 70–99)
Glucose-Capillary: 261 mg/dL — ABNORMAL HIGH (ref 70–99)

## 2020-09-09 IMAGING — MR MR CARD MORPHOLOGY WO/W CM
45 of 48 series · 45 of 48 positions shown · IV contrast (Gadavist)
Comparison: none

CLINICAL DATA: Ischemic cardiomyopathy, assess viability.

EXAM:
CARDIAC MRI
TECHNIQUE: The patient was scanned on a 1.5 Tesla GE magnet. A dedicated
cardiac coil was used. Functional imaging was done using Fiesta
sequences. [DATE], and 4 chamber views were done to assess for RWMA's.
Modified RAACH rule using a short axis stack was used to
calculate an ejection fraction on a dedicated work station using
Circle software. The patient received 8 cc of Gadavist. After 10
minutes inversion recovery sequences were used to assess for
infiltration and scar tissue.

[Series 4: [id number] · axial · 8.0mm · 1.41mm/px · 1 of 16 slices shown]
[im 1/16]
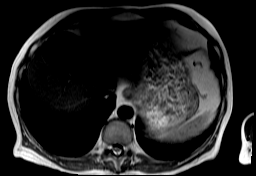

[Series 8: bSSFP · oblique · 8.0mm · 1.61mm/px · 1 of 25 slices shown (1 of 22)]
[im 1/25]
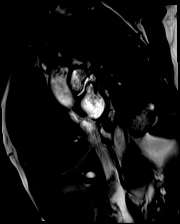

[Series 9: bSSFP · oblique · 8.0mm · 1.61mm/px · 1 of 25 slices shown (2 of 22)]
[im 1/25]
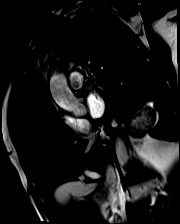

[Series 10: bSSFP · oblique · 8.0mm · 1.61mm/px · 1 of 25 slices shown (3 of 22)]
[im 1/25]
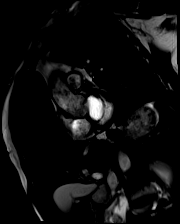

[Series 11: bSSFP · oblique · 8.0mm · 1.61mm/px · 1 of 25 slices shown (4 of 22)]
[im 1/25]
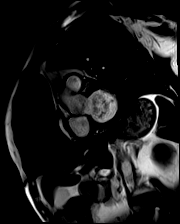

[Series 12: bSSFP · oblique · 8.0mm · 1.61mm/px · 1 of 25 slices shown (5 of 22)]
[im 1/25]
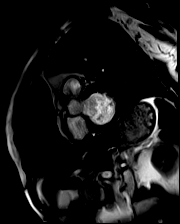

[Series 13: bSSFP · oblique · 8.0mm · 1.61mm/px · 1 of 25 slices shown (6 of 22)]
[im 1/25]
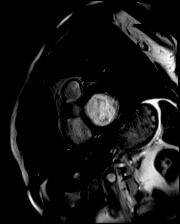

[Series 14: bSSFP · oblique · 8.0mm · 1.61mm/px · 1 of 25 slices shown (7 of 22)]
[im 1/25]
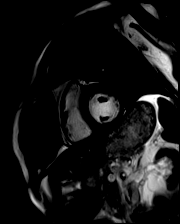

[Series 15: bSSFP · oblique · 8.0mm · 1.61mm/px · 1 of 25 slices shown (8 of 22)]
[im 1/25]
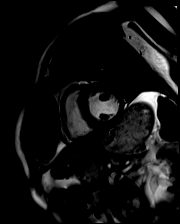

[Series 16: bSSFP · oblique · 8.0mm · 1.61mm/px · 1 of 25 slices shown (9 of 22)]
[im 1/25]
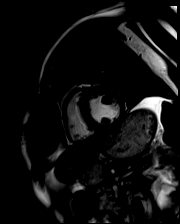

[Series 17: bSSFP · oblique · 8.0mm · 1.61mm/px · 1 of 25 slices shown (10 of 22)]
[im 1/25]
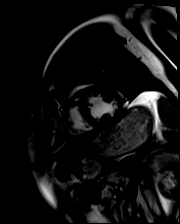

[Series 18: bSSFP · oblique · 8.0mm · 1.61mm/px · 1 of 25 slices shown (11 of 22)]
[im 1/25]
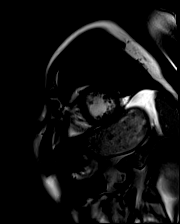

[Series 19: bSSFP · oblique · 8.0mm · 1.61mm/px · 1 of 25 slices shown (12 of 22)]
[im 1/25]
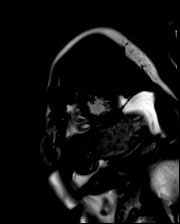

[Series 20: bSSFP · oblique · 8.0mm · 1.61mm/px · 1 of 25 slices shown (13 of 22)]
[im 1/25]
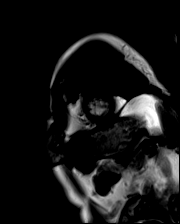

[Series 21: bSSFP · oblique · 8.0mm · 1.61mm/px · 1 of 25 slices shown (14 of 22)]
[im 1/25]
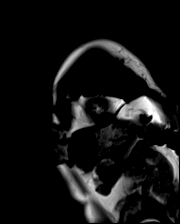

[Series 22: bSSFP · oblique · 8.0mm · 1.61mm/px · 1 of 25 slices shown (15 of 22)]
[im 1/25]
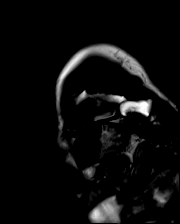

[Series 23: bSSFP · oblique · 8.0mm · 1.61mm/px · 1 of 25 slices shown (16 of 22)]
[im 1/25]
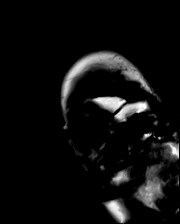

[Series 24: bSSFP · oblique · 8.0mm · 1.61mm/px · 1 of 25 slices shown (17 of 22)]
[im 1/25]
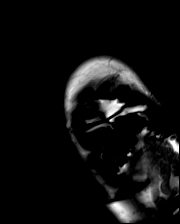

[Series 25: bSSFP · oblique · 8.0mm · 1.61mm/px · 1 of 25 slices shown (18 of 22)]
[im 1/25]
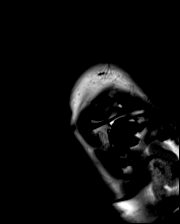

[Series 26: (id)_long_t1 · oblique · 8.0mm · 1.56mm/px · 1 of 24 slices shown]
[im 1/24]
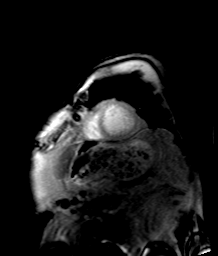

[Series 27: (id)_long_t1_moco · oblique · 8.0mm · 1.56mm/px · 1 of 24 slices shown]
[im 1/24]
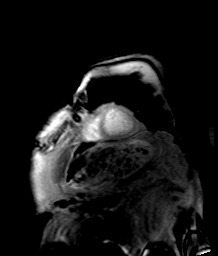

[Series 28: (id)_long_t1_moco_t1 · oblique · 8.0mm · 1.56mm/px · 1 of 3 slices shown (1 of 2)]
[im 1/3]
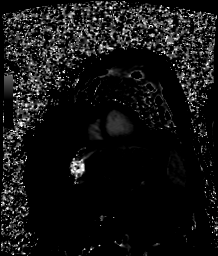

[Series 28: (id)_long_t1_moco_t1 · 1 of 3 slices shown (2 of 2)]
[im 1/3]
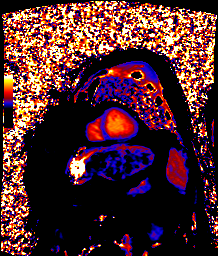

[Series 30: (id)_trufi · oblique · 8.0mm · 2.08mm/px · 1 of 9 slices shown]
[im 1/9]
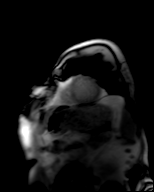

[Series 31: (id)_trufi_moco · oblique · 8.0mm · 2.08mm/px · 1 of 9 slices shown]
[im 1/9]
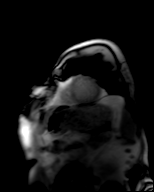

[Series 32: (id)_trufi_moco_t2 · oblique · 8.0mm · 2.08mm/px · 1 of 3 slices shown]
[im 1/3]
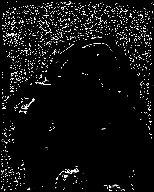

[Series 34: bSSFP · oblique · 6.0mm · 1.48mm/px · 1 of 25 slices shown (19 of 22)]
[im 1/25]
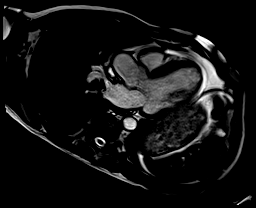

[Series 35: bSSFP · sagittal · 6.0mm · 1.41mm/px · 1 of 25 slices shown (20 of 22)]
[im 1/25]
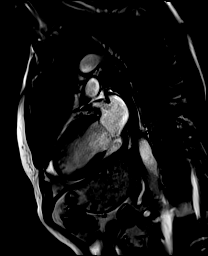

[Series 36: bSSFP · axial · 6.0mm · 1.48mm/px · 1 of 25 slices shown (21 of 22)]
[im 1/25]
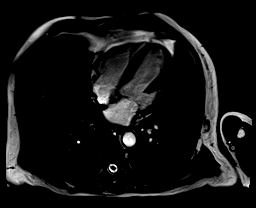

[Series 37: pre short axis · oblique · non-contrast · 8.0mm · 2.38mm/px · 1 of 10 slices shown (1 of 6)]
[im 1/10]
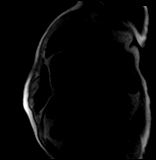

[Series 38: pre short axis · oblique · non-contrast · 8.0mm · 2.38mm/px · 1 of 10 slices shown (2 of 6)]
[im 1/10]
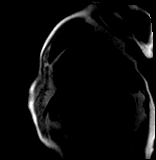

[Series 39: pre short axis · oblique · non-contrast · 8.0mm · 2.38mm/px · 1 of 10 slices shown (3 of 6)]
[im 1/10]
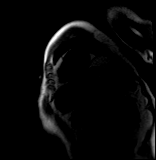

[Series 40: pre short axis · oblique · non-contrast · 8.0mm · 2.38mm/px · 1 of 10 slices shown (4 of 6)]
[im 1/10]
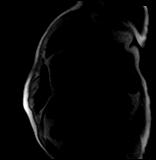

[Series 41: pre short axis · oblique · non-contrast · 8.0mm · 2.38mm/px · 1 of 10 slices shown (5 of 6)]
[im 1/10]
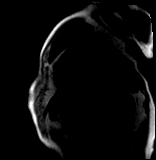

[Series 42: pre short axis · oblique · non-contrast · 8.0mm · 2.38mm/px · 1 of 10 slices shown (6 of 6)]
[im 1/10]
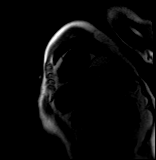

[Series 43: rest short axis · oblique · 8.0mm · 2.38mm/px · 1 of 60 slices shown (1 of 6)]
[im 1/60]
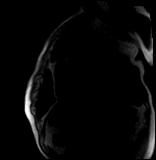

[Series 44: rest short axis · oblique · 8.0mm · 2.38mm/px · 1 of 60 slices shown (2 of 6)]
[im 1/60]
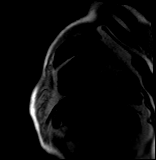

[Series 45: rest short axis · oblique · 8.0mm · 2.38mm/px · 1 of 60 slices shown (3 of 6)]
[im 1/60]
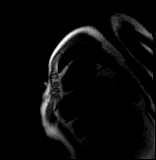

[Series 46: rest short axis · oblique · 8.0mm · 2.38mm/px · 1 of 60 slices shown (4 of 6)]
[im 1/60]
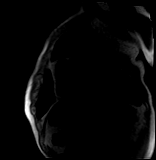

[Series 47: rest short axis · oblique · 8.0mm · 2.38mm/px · 1 of 60 slices shown (5 of 6)]
[im 1/60]
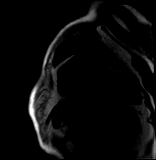

[Series 48: rest short axis · oblique · 8.0mm · 2.38mm/px · 1 of 60 slices shown (6 of 6)]
[im 1/60]
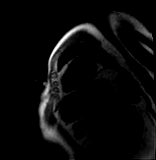

[Series 49: bSSFP · coronal · 6.0mm · 1.48mm/px · 1 of 25 slices shown (22 of 22)]
[im 1/25]
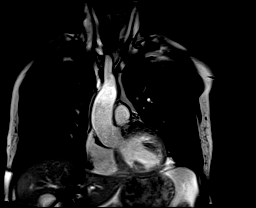

[Series 50: aortic valve cine · oblique · 6.0mm · 1.45mm/px · 1 of 25 slices shown]
[im 1/25]
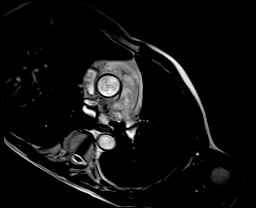

[Series 51: cine rvit · oblique · 6.0mm · 1.48mm/px · 1 of 25 slices shown]
[im 1/25]
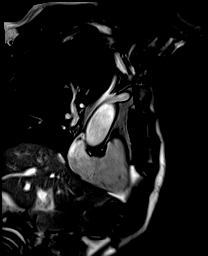

[Series 52: cine rvot · sagittal · 6.0mm · 1.41mm/px · 1 of 25 slices shown]
[im 1/25]
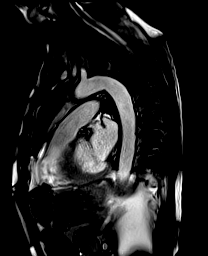

[45 of 48 positions shown; findings below may reference images not displayed]

FINDINGS: Normal left ventricular size and normal wall thickness. Basal to mid
inferolateral akinesis, inferior hypokinesis, mid anterolateral
hypokinesis. Images not optimal for EF quantification, estimated LV
EF 25-30%. Normal right ventricular size and systolic function.
Normal left and right atrial size. No significant mitral
regurgitation or tricuspid regurgitation. Trileaflet aortic valve,
no significant regurgitation or stenosis.

Delayed enhancement imaging:

25 - 49% wall thickness subendocardial late gadolinium enhancement
in the basal to mid inferolateral wall.
IMPRESSION: 1. Normal left ventricular size with wall motion abnormalities as
noted above. LV EF 25-30% (images not ideal for quantification by
RAACH rule).

2.  Normal RV size and systolic function.

3. There is significant myocardial viability. There is
non-transmural scar only in the basal to mid inferolateral wall. May
see significant LV functional improvement with full
revascularization.

RAACH

## 2020-09-09 MED ORDER — ADULT MULTIVITAMIN W/MINERALS CH: 1.0000 | ORAL_TABLET | Freq: Every day | ORAL | Status: DC

## 2020-09-09 MED ORDER — METFORMIN HCL ER 500 MG PO TB24: 500.0000 mg | ORAL_TABLET | Freq: Two times a day (BID) | ORAL | Status: AC

## 2020-09-09 MED ORDER — CLOPIDOGREL BISULFATE 75 MG PO TABS
75.0000 mg | ORAL_TABLET | Freq: Every day | ORAL | Status: DC
  Administered 2020-09-09: 75 mg via ORAL
  Filled 2020-09-09: qty 1

## 2020-09-09 MED ORDER — CLOPIDOGREL BISULFATE 75 MG PO TABS
75.0000 mg | ORAL_TABLET | Freq: Every day | ORAL | 11 refills | Status: DC
Start: 2020-09-09 — End: 2021-08-09

## 2020-09-09 MED ORDER — ENSURE ENLIVE PO LIQD: 237.0000 mL | Freq: Three times a day (TID) | ORAL | Status: DC

## 2020-09-09 MED ORDER — SPIRONOLACTONE 12.5 MG HALF TABLET
12.5000 mg | ORAL_TABLET | Freq: Every day | ORAL | Status: DC
  Administered 2020-09-09: 12.5 mg via ORAL
  Filled 2020-09-09: qty 1

## 2020-09-09 MED ORDER — GADOBUTROL 1 MMOL/ML IV SOLN
10.0000 mL | Freq: Once | INTRAVENOUS | Status: AC | PRN
  Administered 2020-09-09: 10 mL via INTRAVENOUS

## 2020-09-09 MED ORDER — PANTOPRAZOLE SODIUM 40 MG PO TBEC: 40.0000 mg | DELAYED_RELEASE_TABLET | Freq: Every day | ORAL | 5 refills | Status: DC

## 2020-09-09 NOTE — Progress Notes (Signed)
Patient's wife alerted me at 02:02 that he stood to use the bathroom and his speech was slurred. Upon examination patient had difficulty with word recall and slightly slurred speech, no other deficits noted. BP was 146/77 supine, HR 69, CBG 144. Rapid Response called and evaluated patient. Patient's symptoms resolved completely and speech back to normal at 02:09. Will continue to monitor.

## 2020-09-09 NOTE — Progress Notes (Addendum)
Advanced Heart Failure Rounding Note  PCP-Cardiologist: Dr. Allyson Sabal [facility]: Dr. Shirlee Latch    Patient Profile   80 y/o male w/ HTN, T2DM, carotid artery disease s/p lt CEA in 2015 and newly diagnosed CM. EF 20-25%. Brought in for diagnostic R/LHC and found to have severe 3VCAD including 80% ostial LM disease. RHC showed low filling pressures and preserved cardiac output.  He was admitted post-cath for TCTS evaluation.   Subjective:    Pt had brief episode of slurred speech last night, post cath. Rapid response called and symptoms quickly resolved. No recurrence. Feels better and back to baseline today.   Denies CP. No dyspnea. He has considered options and declines CABG. Wants medical management.   SCr 1.01 post cath. Radial cath site stable.     R/LHC 09/08/20  Right Heart Pressures RHC Procedural Findings: Hemodynamics (mmHg) RA mean 1 RV 19/2 PA 19/8 PCWP mean 1  Oxygen saturations: PA 76% AO 100%  Cardiac Output (Fick) 4.81  Cardiac Index (Fick) 2.83     Diagnostic Dominance: Right  Left Main  Ost LM lesion is 80% stenosed.  Left Anterior Descending  Mid LAD lesion is 60% stenosed.  Dist LAD lesion is 70% stenosed.  First Diagonal Branch  1st Diag lesion is 95% stenosed.  Left Circumflex  Collaterals  Dist Cx filled by collaterals from RPDA.    Ost Cx lesion is 100% stenosed.  Right Coronary Artery  Prox RCA lesion is 80% stenosed.  Mid RCA lesion is 95% stenosed.   Intervention   No interventions have been documented.  Objective:   Weight Range: 60.3 kg Body mass index is 21.47 kg/m.   Vital Signs:   Temp:  [97.3 F (36.3 C)-98.2 F (36.8 C)] 97.8 F (36.6 C) (08/03 0105) Pulse Rate:  [53-78] 67 (08/03 0105) Resp:  [11-29] 17 (08/03 0105) BP: (93-174)/(63-111) 146/77 (08/03 0204) SpO2:  [97 %-100 %] 98 % (08/03 0105) Weight:  [60.3 kg-60.4 kg] 60.3 kg (08/03 0438) Last BM Date: 09/07/20  Weight change: Filed Weights   09/08/20 1200  09/08/20 1804 09/09/20 0438  Weight: 60.3 kg 60.4 kg 60.3 kg    Intake/Output:   Intake/Output Summary (Last 24 hours) at 09/09/2020 0806 Last data filed at 09/09/2020 0440 Gross per 24 hour  Intake 298.75 ml  Output 1150 ml  Net -851.25 ml      Physical Exam    General:  Well appearing elderly male. No resp difficulty HEENT: Normal + left cataract  Neck: Supple. No JVP . + left CEA scar.Carotids 2+ bilat; no bruits. No lymphadenopathy or thyromegaly appreciated. Cor: PMI nondisplaced. Regular rate & rhythm. No rubs, gallops or murmurs. Lungs: Clear Abdomen: Soft, nontender, nondistended. No hepatosplenomegaly. No bruits or masses. Good bowel sounds. Extremities: No cyanosis, clubbing, rash, edema. Rt radial 2+ pulse  Neuro: Alert & orientedx3, cranial nerves grossly intact. moves all 4 extremities w/o difficulty. Affect pleasant   Telemetry   NSR 60s   EKG    No new EKG to review   Labs    CBC Recent Labs    09/08/20 1127 09/08/20 1342 09/08/20 1343  WBC 5.2  --   --   HGB 14.4 12.9* 13.3  HCT 43.3 38.0* 39.0  MCV 94.3  --   --   PLT 195  --   --    Basic Metabolic Panel Recent Labs    20/35/59 1127 09/08/20 1342 09/08/20 1343 09/09/20 0153  NA 136   < > 142  137  K 4.2   < > 3.9 3.5  CL 104  --   --  104  CO2 25  --   --  24  GLUCOSE 207*  --   --  131*  BUN 17  --   --  19  CREATININE 0.96  --   --  1.01  CALCIUM 10.0  --   --  9.2   < > = values in this interval not displayed.   Liver Function Tests No results for input(s): AST, ALT, ALKPHOS, BILITOT, PROT, ALBUMIN in the last 72 hours. No results for input(s): LIPASE, AMYLASE in the last 72 hours. Cardiac Enzymes No results for input(s): CKTOTAL, CKMB, CKMBINDEX, TROPONINI in the last 72 hours.  BNP: BNP (last 3 results) No results for input(s): BNP in the last 8760 hours.  ProBNP (last 3 results) No results for input(s): PROBNP in the last 8760 hours.   D-Dimer No results for input(s):  DDIMER in the last 72 hours. Hemoglobin A1C No results for input(s): HGBA1C in the last 72 hours. Fasting Lipid Panel No results for input(s): CHOL, HDL, LDLCALC, TRIG, CHOLHDL, LDLDIRECT in the last 72 hours. Thyroid Function Tests No results for input(s): TSH, T4TOTAL, T3FREE, THYROIDAB in the last 72 hours.  Invalid input(s): FREET3  Other results:   Imaging    CARDIAC CATHETERIZATION  Result Date: 09/09/2020 Formatting of this result is different from the original.   Prox RCA lesion is 80% stenosed.   Mid RCA lesion is 95% stenosed.   Ost Cx lesion is 100% stenosed.   Ost LM lesion is 80% stenosed.   Mid LAD lesion is 60% stenosed.   1st Diag lesion is 95% stenosed.   Dist LAD lesion is 70% stenosed. 1. Low filling pressures. 2. Preserved cardiac output. 3. Severe coronary disease with critical mid RCA stenosis, occluded ostial LCx with collaterals from PDA, and 80% left main stenosis with moderate LAD disease. I will admit the patient for consultation for CABG.  This is complicated by ischemic cardiomyopathy, though he is well-compensated.     Medications:     Scheduled Medications:  aspirin EC  81 mg Oral q morning   carvedilol  6.25 mg Oral BID WC   doxazosin  4 mg Oral Daily   enoxaparin (LOVENOX) injection  40 mg Subcutaneous Q24H   insulin aspart  0-5 Units Subcutaneous QHS   insulin aspart  0-9 Units Subcutaneous TID WC   metFORMIN  500 mg Oral BID WC   sacubitril-valsartan  1 tablet Oral BID   sodium chloride flush  3 mL Intravenous Q12H   sodium chloride flush  3 mL Intravenous Q12H    Infusions:  sodium chloride     sodium chloride      PRN Medications: sodium chloride, sodium chloride, acetaminophen, acetaminophen, ondansetron (ZOFRAN) IV, ondansetron (ZOFRAN) IV, polyvinyl alcohol, sodium chloride flush, sodium chloride flush    Patient Profile   80 y/o male w/ HTN, T2DM, carotid artery disease s/p lt CEA in 2015 and newly diagnosed CM. EF 25%. Brought  in for diagnostic R/LHC and found to have severe 3VCAD including 80% ostial LM disease. RHC showed low filling pressures and preserved cardiac output.  He was admitted post-cath for TCTS evaluation.  Assessment/Plan   1. CAD: Cath with severe coronary disease with critical RCA stenosis, occluded ostial LCX, L main disease, and preserved cardiac output. CT surgery consulted for possible CABG and feels he would be at least moderate risk due  to his low EF, age, and other comorbidities. After weighing options, pt declines surgery. Plan medical management.  - denies ischemic CP - ASA 81 mg  - Coreg 6.25 mg qam/ 12.5 mg qpm  - Statin intolerant - Would benefit from lipid clinic referral for PCSK9i or Leqvio  2.  Chronic systolic CHF: Ischemic cardiomyopathy.  Echo in 5/22 showed EF 20-25% with normal RV, interestingly, the PA systolic pressure estimation was severely elevated at 86 mmHg. RHC this admission with low filling pressure, preserved cardiac output, actually no pulmonary hypertension.   - Continue Entresto 49/51 bid. - Coreg 6.25 mg qam/ 12.5 mg qpm  - Continue spironolactone 12.5 mg daily.   - Euvolemic, does not need Lasix at this time.  3. Carotid stenosis: S/p left CEA.   - carotid dopplers followed by [facility]. - Will get recent lipids from PCP, goal LDL < 70.   - Patient is on lovastatin, says he cannot take other statins. If not at goal, would benefit from lipid clinic referral for PCSK9i or Leqvio - Continue ASA 81 daily. 4. HTN: controlled on current regimen  5. Snoring/fatigue: consider home sleep study  6. Type 2DM - hold metformin x 48 hr post cath.   Likely d/c home today. MD to see.   Length of Stay: 1  Brittainy [name], [name]  09/09/2020, 8:06 AM  Advanced Heart Failure Team Pager [phone] (M-F; 7a - 5p)  Please contact [facility] for night-coverage after hours (5p -7a ) and weekends on [email or web address]  Patient seen with PA, agree with the above note.   Had brief  episode slurred speech yesterday, improved with drinking fluid and eating.  No recurrence.   No chest pain, no dyspnea.   General: NAD Neck: No JVD, no thyromegaly or thyroid nodule.  Lungs: Clear to auscultation bilaterally with normal respiratory effort. CV: Nondisplaced PMI.  Heart regular S1/S2, no S3/S4, no murmur.  No peripheral edema.   Abdomen: Soft, nontender, no hepatosplenomegaly, no distention.  Skin: Intact without lesions or rashes.  Neurologic: Alert and oriented x 3.  Psych: Normal affect. Extremities: No clubbing or cyanosis.  HEENT: Normal.   Patient has extensive 3VD with critical RCA stenoses, 80% ostial left main, and occluded LCx with collaterals.  After consideration, he is not interested in CABG.  This would be high risk with age and low EF.    I will discuss with Dr. Allyson Sabal regarding feasibility of PCI here. His symptoms are exertional dyspnea/fatigue, no chest pain.  Low EF is due to ischemic cardiomyopathy.  He has cMRI scheduled today.  If there is extensive viability in RCA and LAD territories (LCx occluded and not PCI target), would consider PCI to RCA and possibly left main.   Marca Ancona 09/09/2020  9:21 AM  Dr. Allyson Sabal and myself have spoken with Mr Zahner.  Given minimal symptoms at this point, he wishes to proceed with medical management.  I think this is reasonable.  If he develops chest pain in the future, intervention on RCA +/- LM would be feasible and his cardiac MRI shows substantial viability.    I will add Plavix and let him go home today.   Marca Ancona 09/09/2020 2:35 PM

## 2020-09-09 NOTE — Progress Notes (Signed)
Initial Nutrition Assessment  DOCUMENTATION CODES:  Severe malnutrition in context of chronic illness  INTERVENTION:  Add Ensure Enlive po TID, each supplement provides 350 kcal and 20 grams of protein.  Add Magic cup TID with meals, each supplement provides 290 kcal and 9 grams of protein.  Add MVI with minerals daily.  NUTRITION DIAGNOSIS:  Severe Malnutrition related to chronic illness (several PNA infections) as evidenced by energy intake < or equal to 75% for > or equal to 1 month, percent weight loss, severe muscle depletion.  GOAL:  Patient will meet greater than or equal to 90% of their needs  MONITOR:  PO intake, Supplement acceptance, Labs, Weight trends, I & O's  REASON FOR ASSESSMENT:  Malnutrition Screening Tool    ASSESSMENT:  80 yo male with a PMH of T2DM, CAD s/p L CEA in 2015, and new dx of CM, EF 20-25% who presents with CHF.  Pt reports 2 bouts of PNA back to back, which caused him to have an extremely poor appetite. He barely ate anything, eating less than 25% of his meals. He is now feeling much better and is eating closer to 75-100%. Family endorses this.  Pt reports ~30 lb weight loss in 6 months. Per Epic, pt has lost ~14.5 lbs (9.7%) in 4 months, which is significant and severe for the time frame.  On exam, pt with severe depletions.  Recommend adding Ensure Enlive TID and MVI with minerals daily.  Medications: reviewed; SSI, metformin, spironolactone  Labs: reviewed; CBG 144-261 (H)  NUTRITION - FOCUSED PHYSICAL EXAM: Flowsheet Row Most Recent Value  Orbital Region Moderate depletion  Upper Arm Region Severe depletion  Thoracic and Lumbar Region Mild depletion  Buccal Region Moderate depletion  Temple Region Moderate depletion  Clavicle Bone Region Moderate depletion  Clavicle and Acromion Bone Region Severe depletion  Scapular Bone Region Severe depletion  Dorsal Hand Severe depletion  Patellar Region Severe depletion  Anterior Thigh  Region Severe depletion  Posterior Calf Region Severe depletion  Edema (RD Assessment) None  Hair Reviewed  Eyes Reviewed  Mouth Reviewed  Skin Reviewed  Nails Reviewed   Diet Order:   Diet Order             Diet 2 gram sodium Room service appropriate? Yes; Fluid consistency: Thin  Diet effective now                  EDUCATION NEEDS:  Education needs have been addressed  Skin:  Skin Assessment: Reviewed RN Assessment  Last BM:  09/09/20  Height:  Ht Readings from Last 1 Encounters:  09/08/20 5\' 6"  (1.676 m)   Weight:  Wt Readings from Last 1 Encounters:  09/09/20 60.3 kg   BMI:  Body mass index is 21.47 kg/m.  Estimated Nutritional Needs:  Kcal:  1850-2050 Protein:  80-95 grams Fluid:  >1.85 L  11/09/20, RD, LDN (she/her/hers) Registered Dietitian I After-Hours/Weekend Pager # in [place]

## 2020-09-09 NOTE — Care Management CC44 (Signed)
Condition Code 44 Documentation Completed  Patient Details  Name: BERNHARD KOSKINEN MRN: [id number] Date of Birth: 07-27-1940   Condition Code 44 given:  Yes Patient signature on Condition Code 44 notice:  Yes Documentation of 2 MD's agreement:  Yes Code 44 added to claim:  Yes    Gala Lewandowsky, RN 09/09/2020, 4:18 PM

## 2020-09-09 NOTE — Discharge Summary (Addendum)
Advanced Heart Failure Team  Discharge Summary   Patient ID: David Sellers MRN: [id number], DOB/AGE: [birth year] 81 y.o. Admit date: 09/08/2020 D/C date:     09/09/2020   Primary Discharge Diagnoses:  Ischemic Cardiomyopathy/ Systolic Heart Failure Severe 3 Vessel CAD w/o Angina   Secondary Discharge Diagnoses:  Type 2 DM  Hypertension  Carotid Artery Disease   Hospital Course:   80 y/o male w/ HTN, T2DM, carotid artery disease s/p lt CEA in 2015 and newly diagnosed CM. EF 20-25%. Brought in for diagnostic R/LHC and found to have severe 3VCAD including 80% ostial LM disease. RHC showed low filling pressures and preserved cardiac output.  He was admitted post-cath for TCTS evaluation. cMRI was done for viability study and showed substantial viability. CT surgery felt he would be high risk for CABG given his low EF, age, and other comorbidities. PCI was also considered. However, after weighing options, the patient declined both surgery and PCI at this time. Medical therapy elected. This was felt reasonable, given his lack of ischemic CP. If he develops chest pain in the future, intervention on RCA +/- LM would be feasible. Plavix, 75 mg daily, was added to his regimen. He was continued on ASA, statin and ? blocker. Prilosec discontinued. Switched to [employer].   On 8/3, he was last seen and examined by Dr. Shirlee Latch and felt stale for d/c home. Post hospital f/u has been arranged in the [facility]. Also has scheduled f/u w/ Dr. Allyson Sabal.   Cardiac Studies   [facility] 09/08/20   Right Heart Pressures RHC Procedural Findings: Hemodynamics (mmHg) RA mean 1 RV 19/2 PA 19/8 PCWP mean 1  Oxygen saturations: PA 76% AO 100%  Cardiac Output (Fick) 4.81  Cardiac Index (Fick) 2.83        Diagnostic Dominance: Right   Left Main  Ost LM lesion is 80% stenosed.  Left Anterior Descending  Mid LAD lesion is 60% stenosed.  Dist LAD lesion is 70% stenosed.  First Diagonal Branch  1st Diag lesion is 95%  stenosed.  Left Circumflex  Collaterals  Dist Cx filled by collaterals from RPDA.     Ost Cx lesion is 100% stenosed.  Right Coronary Artery  Prox RCA lesion is 80% stenosed.  Mid RCA lesion is 95% stenosed.    Intervention     No interventions have been documented.   Cardiac MRI 09/09/20  IMPRESSION: 1. Normal left ventricular size with wall motion abnormalities as noted above. LV EF 25-30% (images not ideal for quantification by Simpson's rule).   2.  Normal RV size and systolic function.   3. There is significant myocardial viability. There is non-transmural scar only in the basal to mid inferolateral wall. May see significant LV functional improvement with full revascularization.     Discharge Weight Range: 133 lb  Discharge Vitals: Blood pressure (!) 109/56, pulse 69, temperature (!) 97.5 F (36.4 C), temperature source Oral, resp. rate 12, height 5\' 6"  (1.676 m), weight 60.3 kg, SpO2 100 %.  Labs: Lab Results  Component Value Date   WBC 5.2 09/08/2020   HGB 13.3 09/08/2020   HCT 39.0 09/08/2020   MCV 94.3 09/08/2020   PLT 195 09/08/2020    Recent Labs  Lab 09/09/20 0153  NA 137  K 3.5  CL 104  CO2 24  BUN 19  CREATININE 1.01  CALCIUM 9.2  GLUCOSE 131*   No results found for: CHOL, HDL, LDLCALC, TRIG BNP (last 3 results) No results for input(s): BNP in  the last 8760 hours.  ProBNP (last 3 results) No results for input(s): PROBNP in the last 8760 hours.   Diagnostic Studies/Procedures   CARDIAC CATHETERIZATION  Result Date: 09/09/2020 Formatting of this result is different from the original.   Prox RCA lesion is 80% stenosed.   Mid RCA lesion is 95% stenosed.   Ost Cx lesion is 100% stenosed.   Ost LM lesion is 80% stenosed.   Mid LAD lesion is 60% stenosed.   1st Diag lesion is 95% stenosed.   Dist LAD lesion is 70% stenosed. 1. Low filling pressures. 2. Preserved cardiac output. 3. Severe coronary disease with critical mid RCA stenosis, occluded  ostial LCx with collaterals from PDA, and 80% left main stenosis with moderate LAD disease. I will admit the patient for consultation for CABG.  This is complicated by ischemic cardiomyopathy, though he is well-compensated.   MR CARDIAC MORPHOLOGY W WO CONTRAST  Result Date: 09/09/2020 CLINICAL DATA:  Ischemic cardiomyopathy, assess viability. EXAM: CARDIAC MRI TECHNIQUE: The patient was scanned on a 1.5 Tesla GE magnet. A dedicated cardiac coil was used. Functional imaging was done using Fiesta sequences. 2,3, and 4 chamber views were done to assess for RWMA's. Modified Simpson's rule using a short axis stack was used to calculate an ejection fraction on a dedicated work [employer]. The patient received 8 cc of Gadavist. After 10 minutes inversion recovery sequences were used to assess for infiltration and scar tissue. FINDINGS: Normal left ventricular size and normal wall thickness. Basal to mid inferolateral akinesis, inferior hypokinesis, mid anterolateral hypokinesis. Images not optimal for EF quantification, estimated LV EF 25-30%. Normal right ventricular size and systolic function. Normal left and right atrial size. No significant mitral regurgitation or tricuspid regurgitation. Trileaflet aortic valve, no significant regurgitation or stenosis. Delayed enhancement imaging: 25 - 49% wall thickness subendocardial late gadolinium enhancement in the basal to mid inferolateral wall. IMPRESSION: 1. Normal left ventricular size with wall motion abnormalities as noted above. LV EF 25-30% (images not ideal for quantification by Simpson's rule). 2.  Normal RV size and systolic function. 3. There is significant myocardial viability. There is non-transmural scar only in the basal to mid inferolateral wall. May see significant LV functional improvement with full revascularization. Genoa Freyre Electronically Signed   By: Marca Ancona M.D.   On: 09/09/2020 14:20    Discharge Medications    Allergies as of 09/09/2020       Reactions   Albuterol Anaphylaxis   Per patient and daughter   Meloxicam Itching, Palpitations, Other (See Comments)   Headaches, Mood swings.   Morphine And Related Nausea And Vomiting   Statins Other (See Comments)   Myalgias. Severe arthritic response. Tolerates Lovastatin.    Cholestoff [plant Sterols And Stanols] Other (See Comments)   Muscle aches   Nsaids Other (See Comments)   Aleve ,  Myalgia   Cortisone Hypertension   Patient states cortisone injections he has received for his knees cause hypertension and hyperglycemia   Darvon [propoxyphene] Other (See Comments)   hallucinate   Doxycycline    Other reaction(s): Coughing   Glimepiride Itching   Hydrochlorothiazide    Other reaction(s): Low Blood Pressure   Indomethacin    Neck pain flare   Penicillins    *positive allergy test*   Sulfa Antibiotics Nausea And Vomiting   Terbinafine    Other reaction(s): diarrhea   Tramadol Other (See Comments)   Hallucinations        Medication List  STOP taking these medications    omeprazole 20 MG capsule Commonly known as: PRILOSEC       TAKE these medications    acetaminophen 650 MG CR tablet Commonly known as: TYLENOL Take 1,300 mg by mouth in the morning and at bedtime.   aspirin EC 81 MG tablet Take 81 mg by mouth every morning.   Biotin 1000 MCG Chew Chew 1,000 mcg by mouth at bedtime.   carvedilol 6.25 MG tablet Commonly known as: COREG Take 1 tablet each morning and 2 tablets each night.   cetirizine 10 MG tablet Commonly known as: ZYRTEC Take 10 mg by mouth at bedtime.   clopidogrel 75 MG tablet Commonly known as: PLAVIX Take 1 tablet (75 mg total) by mouth daily.   doxazosin 4 MG tablet Commonly known as: CARDURA Take 4 mg by mouth daily.   Krill Oil 500 MG Caps Take 500 mg by mouth at bedtime.   lovastatin 20 MG tablet Commonly known as: MEVACOR Take 20 mg by mouth at bedtime.   metFORMIN 500  MG 24 hr tablet Commonly known as: GLUCOPHAGE-XR Take 1 tablet (500 mg total) by mouth 2 (two) times daily with breakfast and lunch. Start taking on: September 11, 2020 What changed: These instructions start on September 11, 2020. If you are unsure what to do until then, ask your doctor or other care provider.   OneTouch Verio test strip Generic drug: glucose blood 2 (two) times daily.   pantoprazole 40 MG tablet Commonly known as: Protonix Take 1 tablet (40 mg total) by mouth daily.   polyvinyl alcohol 1.4 % ophthalmic solution Commonly known as: LIQUIFILM TEARS Place 1 drop into both eyes 3 (three) times daily as needed for dry eyes.   sacubitril-valsartan 49-51 MG Commonly known as: ENTRESTO Take 1 tablet by mouth 2 (two) times daily.   spironolactone 25 MG tablet Commonly known as: ALDACTONE Take 0.5 tablets (12.5 mg total) by mouth daily.   SUPER B COMPLEX/C PO Take 1 tablet by mouth daily.        Disposition   The patient will be discharged in stable condition to home.   Follow-up Information     [facility] Follow up.   Specialty: Cardiology Why: September 21, 2020 at 9:30 am  The Advanced [facility] at [facility], [address] Code [phone] Contact information: [address] [id number] [place] [address] [phone]        Runell Gess, MD Follow up.   Specialties: Cardiology, Radiology Why: October 07, 2020 at 11:00 AM Contact information: [address] [place] [address] [phone]                   Duration of Discharge Encounter: Greater than 35 minutes   Signed, Knute Neu  09/09/2020, 3:05 PM   Patient seen with PA, agree with the above note.   After extensive discussion with patient, cardiac surgery and interventional cardiology, it was decided to treat him medically.   No chest pain or dyspnea day of  discharge.   Marca Ancona 09/10/2020

## 2020-09-09 NOTE — Care Management Obs Status (Signed)
MEDICARE OBSERVATION STATUS NOTIFICATION   Patient Details  Name: RMANI KELLOGG MRN: [id number] Date of Birth: 1940/12/09   Medicare Observation Status Notification Given:  Yes    Gala Lewandowsky, RN 09/09/2020, 4:18 PM

## 2020-09-15 ENCOUNTER — Other Ambulatory Visit: Payer: Self-pay

## 2020-09-15 ENCOUNTER — Ambulatory Visit (HOSPITAL_COMMUNITY)
Admission: RE | Admit: 2020-09-15 | Discharge: 2020-09-15 | Disposition: A | Discharge status: Home / Self Care | Payer: PPO | Source: Ambulatory Visit | Attending: Internal Medicine | Admitting: Internal Medicine

## 2020-09-15 DIAGNOSIS — I5020 Unspecified systolic (congestive) heart failure: Secondary | ICD-10-CM | POA: Diagnosis not present

## 2020-09-15 LAB — BASIC METABOLIC PANEL
Anion gap: 7 (ref 5–15)
BUN: 17 mg/dL (ref 8–23)
CO2: 24 mmol/L (ref 22–32)
Calcium: 10.1 mg/dL (ref 8.9–10.3)
Chloride: 105 mmol/L (ref 98–111)
Creatinine, Ser: 0.89 mg/dL (ref 0.61–1.24)
GFR, Estimated: >60 mL/min (ref >60)
Glucose, Bld: 147 mg/dL — ABNORMAL HIGH (ref 70–99)
Potassium: 4.6 mmol/L (ref 3.5–5.1)
Sodium: 136 mmol/L (ref 135–145)

## 2020-09-15 LAB — BRAIN NATRIURETIC PEPTIDE: B Natriuretic Peptide: 231.3 pg/mL — ABNORMAL HIGH (ref 0.0–100.0)

## 2020-09-17 ENCOUNTER — Telehealth: Payer: Self-pay | Admitting: Cardiovascular Disease

## 2020-09-17 NOTE — Telephone Encounter (Signed)
Spoke with the patient. He stated that he feels like he has a hernia again and was advised in the past that he may not be able to have hernia repair. He has been advised to reach out to the provider that takes care of this and discuss it with him so they can better assess the hernia. If he is needing surgery, he has been advised to call us back and let us know.  He has a follow up with Dr. Alford [place] office on 8/15 so he will also discuss it with them on Monday at the appointment.

## 2020-09-17 NOTE — Telephone Encounter (Signed)
Patient called in to say that he woke up this morning with a hernia and has lower back pains.  And he is not sure if it because of the entresto that he has been taking. Please advise

## 2020-09-18 NOTE — Progress Notes (Addendum)
PCP: Irena Reichmann, DO Cardiology: Dr. Allyson Sabal HF Cardiology: Dr. Shirlee Latch  80 y.o. with history of cardiomyopathy of uncertain etiology, carotid stenosis, HTN, type 2 diabetes, new diagnosis of CAD,  was referred by Dr. Allyson Sabal for evaluation of CHF.  Patient had left CEA in 4/15.  Cardiolite in 3/15 prior to CEA was normal.  He generally did well until 12/21.  Starting at that time, he had 3 episodes of "pneumonia."  In 2/22, due to respiratory symptoms, he had a CT chest.  This showed 3 vessel coronary calcification and ground glass opacities concerning for pulmonary edema. He saw a pulmonologist, and was thought to have pulmonary edema as a cause of his dyspnea (not a primary pulmonary problem).  He does not smoke.  Echo was done in 5/22 due to ongoing dyspnea and fatigue, showing EF 25% with normal RV, PASP 86 mmHg.  Cardiolite was then done in 6/22, showing EF 25% with evidence for prior inferior/inferolateral MI with peri-infarct ischemia.   Seen by Dr. Shirlee Latch 7/22 for evaluation of CHF. Denies CP, had NYHA II symptoms. He was scheduled for [facility].  Underwent [facility] 09/04/20 showing severe coronary disease with critical RCA stenosis, occluded ostial LCX, L main disease, and preserved cardiac output. He was admitted post cath and CT surgery consulted. Patient declined surgery after discussion of his moderate risk. Cardiac MRI showed substantial viability, but with patient having minimal symptoms, decision made to manage medically.  Plavix was added at discharge.  Today he returns for post hospitalization HF & CAD follow up with his wife . Overall feeling fine, now has a right inguinal hernia, no pain with this. No significant exertional dyspnea. Denies CP, dizziness, edema, or PND/Orthopnea. Appetite ok. Weight at home 133-135 pounds. Taking all medications. BP at home 120s/60s. He works as a Education officer, environmental.  Labs (4/22): K 3.7, creatinine 1.0 Labs (8/22): K 4.6, creatinine 0.89  ECG (personally reviewed): SR  lateral TWI  PMH:  1. HTN 2. BPH 3. Carotid stenosis: Left CEA in 4/15.  4. Type 2 diabetes 5. Chronic systolic CHF: Cardiomyopathy of uncertain etiology.  - Cardiolite in 3/15 was normal - CT chest in 2/22 with 3 vessel CAD and patchy GGO that may be due to pulmonary edema.  - Echo (5/22): EF 20-25%, normal RV, mild-moderate MR, PASP 86 mmHg.  - Cardiolite (6/22): EF 25%, large primarily fixed inferior/inferolateral defect => suspect prior MI with peri-infarct ischemia.  - RHC (09/04/20): Low filling pressures, preserved CO. RA 1, PA 19/8 PCWP 1, PA 76%, CO 4.8, CI 2.8  6. Hyperlipidemia: He has not tolerated any statin other than lovastatin.  7. CAD: severe on cath (7/22) with critical RCA stenosis, occluded ostial LCx, left main disease. - cMRI (7/22): EF 25-50%, normal RV,  substantial cardiac viability.  Social History   Socioeconomic History   Marital status: Married    Spouse name: Not on file   Number of children: Not on file   Years of education: Not on file   Highest education level: Not on file  Occupational History   Not on file  Tobacco Use   Smoking status: Never   Smokeless tobacco: Never  Vaping Use   Vaping Use: Never used  Substance and Sexual Activity   Alcohol use: No   Drug use: No   Sexual activity: Yes  Other Topics Concern   Not on file  Social History Narrative   Not on file   Social Determinants of Health   Financial Resource Strain:  Not on file  Food Insecurity: Not on file  Transportation Needs: Not on file  Physical Activity: Not on file  Stress: Not on file  Social Connections: Not on file  Intimate Partner Violence: Not on file   Family History  Adopted: Yes  Problem Relation Age of Onset   Heart disease Father    Heart attack Father 37   Hypertension Father    Hyperlipidemia Father    Diabetes Daughter    Hyperlipidemia Daughter    Hypertension Daughter    Diabetes Daughter    Hyperlipidemia Daughter    Hypertension  Daughter    Kidney disease Mother    ROS: All systems reviewed and negative except as per HPI.   Current Outpatient Medications  Medication Sig Dispense Refill   acetaminophen (TYLENOL) 650 MG CR tablet Take 1,300 mg by mouth in the morning and at bedtime.     aspirin EC 81 MG tablet Take 81 mg by mouth every morning.     Biotin 1000 MCG CHEW Chew 1,000 mcg by mouth at bedtime.     carvedilol (COREG) 6.25 MG tablet Take 1 tablet each morning and 2 tablets each night. 270 tablet 3   cetirizine (ZYRTEC) 10 MG tablet Take 10 mg by mouth at bedtime.     clopidogrel (PLAVIX) 75 MG tablet Take 1 tablet (75 mg total) by mouth daily. 30 tablet 11   doxazosin (CARDURA) 4 MG tablet Take 4 mg by mouth daily.     Krill Oil 500 MG CAPS Take 500 mg by mouth at bedtime.     lovastatin (MEVACOR) 20 MG tablet Take 20 mg by mouth at bedtime.     metFORMIN (GLUCOPHAGE-XR) 500 MG 24 hr tablet Take 1 tablet (500 mg total) by mouth 2 (two) times daily with breakfast and lunch.     ONETOUCH VERIO test strip 2 (two) times daily.     pantoprazole (PROTONIX) 40 MG tablet Take 1 tablet (40 mg total) by mouth daily. 30 tablet 5   polyvinyl alcohol (LIQUIFILM TEARS) 1.4 % ophthalmic solution Place 1 drop into both eyes 3 (three) times daily as needed for dry eyes.     sacubitril-valsartan (ENTRESTO) 49-51 MG Take 1 tablet by mouth 2 (two) times daily. 60 tablet 6   spironolactone (ALDACTONE) 25 MG tablet Take 0.5 tablets (12.5 mg total) by mouth daily. 45 tablet 3   SUPER B COMPLEX/C PO Take 1 tablet by mouth daily.     No current facility-administered medications for this encounter.   Wt Readings from Last 3 Encounters:  09/21/20 62 kg (136 lb 9.6 oz)  09/09/20 60.3 kg (133 lb)  09/04/20 62 kg (136 lb 9.6 oz)   BP (!) 142/76   Pulse (!) 53   Wt 62 kg (136 lb 9.6 oz)   SpO2 99%   BMI 22.05 kg/m  General:  NAD. No resp difficulty HEENT: Normal, bilateral hearing aids  Neck: Supple. No JVD. Carotids 2+  bilat; no bruits. No lymphadenopathy or thryomegaly appreciated. Cor: PMI nondisplaced. Regular rate & rhythm. No rubs, gallops or murmurs. Lungs: Clear Abdomen: Soft, nontender, nondistended. No hepatosplenomegaly. No bruits or masses. Good bowel sounds. Extremities: No cyanosis, clubbing, rash, edema Neuro: Alert & oriented x 3, cranial nerves grossly intact. Moves all 4 extremities w/o difficulty. Affect pleasant.  Assessment/Plan: 1. CAD: Cath 8/22 with severe coronary disease with critical RCA stenosis, occluded ostial LCX, L main disease, and preserved cardiac output. CT surgery consulted for possible CABG and  feels he would be at least moderate risk due to his low EF, age, and other comorbidities. After weighing options, pt declines surgery. Plan medical management.  If he develops chest pain in the future, intervention on RCA +/- LM would be feasible and his cardiac MRI shows substantial viability.   - Denies ischemic CP. - Continue ASA 81 mg + Plavix + beta blocker. - Intolerant of statins except for lovastatin. Check lipids today. - Would benefit from lipid clinic referral for PCSK9i or Leqvio if LDL not at goal <70.  2.  Chronic systolic CHF: Ischemic cardiomyopathy.  Echo in 5/22 showed EF 20-25% with normal RV, interestingly, the PA systolic pressure estimation was severely elevated at 86 mmHg. RHC 8/22 with low filling pressure, preserved cardiac output, actually no pulmonary hypertension.  NYHA II, he is not volume overloaded today. - Start Jardiance 10 mg daily. BMET today and repeat in 10 days. - Continue Entresto 49/51 mg bid. - Continue Coreg 6.25 mg qam/ 12.5 mg qpm  - Continue spironolactone 12.5 mg daily.   - Does not need Lasix at this time. He tells me it lowers his BP significantly. 3. Carotid stenosis: S/p left CEA.   - Carotid dopplers followed by [facility]. - Check lipids today, goal LDL < 70.   - Patient is on lovastatin, says he cannot take other statins. If not at goal,  would benefit from lipid clinic referral for PCSK9i or Leqvio. - Continue ASA 81 daily. 4. HTN: Controlled on current regimen.  5. Snoring/fatigue: Will arrange for home sleep study. 6. Type 2DM: Jardiance as above.  Follow up in 3 weeks with PharmD for further medication titration and in 6 weeks with APP. Consider increasing spiro or Entresto.  Anderson Malta [facility] FNP 09/21/2020

## 2020-09-21 ENCOUNTER — Ambulatory Visit (HOSPITAL_COMMUNITY)
Admit: 2020-09-21 | Discharge: 2020-09-21 | Disposition: A | Discharge status: Home / Self Care | Payer: PPO | Attending: Family Medicine | Admitting: Family Medicine

## 2020-09-21 ENCOUNTER — Other Ambulatory Visit: Payer: Self-pay

## 2020-09-21 ENCOUNTER — Encounter (HOSPITAL_COMMUNITY): Payer: Self-pay

## 2020-09-21 ENCOUNTER — Other Ambulatory Visit (HOSPITAL_COMMUNITY): Payer: Self-pay

## 2020-09-21 VITALS — BP 142/76 | HR 53 | Wt 136.6 lb

## 2020-09-21 DIAGNOSIS — I779 Disorder of arteries and arterioles, unspecified: Secondary | ICD-10-CM

## 2020-09-21 DIAGNOSIS — Z79899 Other long term (current) drug therapy: Secondary | ICD-10-CM | POA: Diagnosis not present

## 2020-09-21 DIAGNOSIS — Z7984 Long term (current) use of oral hypoglycemic drugs: Secondary | ICD-10-CM | POA: Diagnosis not present

## 2020-09-21 DIAGNOSIS — E782 Mixed hyperlipidemia: Secondary | ICD-10-CM

## 2020-09-21 DIAGNOSIS — E119 Type 2 diabetes mellitus without complications: Secondary | ICD-10-CM

## 2020-09-21 DIAGNOSIS — I5022 Chronic systolic (congestive) heart failure: Secondary | ICD-10-CM | POA: Diagnosis not present

## 2020-09-21 DIAGNOSIS — R0683 Snoring: Secondary | ICD-10-CM | POA: Diagnosis not present

## 2020-09-21 DIAGNOSIS — Z7902 Long term (current) use of antithrombotics/antiplatelets: Secondary | ICD-10-CM | POA: Insufficient documentation

## 2020-09-21 DIAGNOSIS — Z8249 Family history of ischemic heart disease and other diseases of the circulatory system: Secondary | ICD-10-CM | POA: Diagnosis not present

## 2020-09-21 DIAGNOSIS — I10 Essential (primary) hypertension: Secondary | ICD-10-CM | POA: Diagnosis not present

## 2020-09-21 DIAGNOSIS — I6522 Occlusion and stenosis of left carotid artery: Secondary | ICD-10-CM | POA: Diagnosis not present

## 2020-09-21 DIAGNOSIS — I2582 Chronic total occlusion of coronary artery: Secondary | ICD-10-CM | POA: Diagnosis not present

## 2020-09-21 DIAGNOSIS — I5020 Unspecified systolic (congestive) heart failure: Secondary | ICD-10-CM | POA: Diagnosis not present

## 2020-09-21 DIAGNOSIS — I255 Ischemic cardiomyopathy: Secondary | ICD-10-CM | POA: Diagnosis not present

## 2020-09-21 DIAGNOSIS — I110 Hypertensive heart disease with heart failure: Secondary | ICD-10-CM | POA: Insufficient documentation

## 2020-09-21 DIAGNOSIS — Z7982 Long term (current) use of aspirin: Secondary | ICD-10-CM | POA: Diagnosis not present

## 2020-09-21 DIAGNOSIS — K4090 Unilateral inguinal hernia, without obstruction or gangrene, not specified as recurrent: Secondary | ICD-10-CM | POA: Diagnosis not present

## 2020-09-21 DIAGNOSIS — I2510 Atherosclerotic heart disease of native coronary artery without angina pectoris: Secondary | ICD-10-CM | POA: Diagnosis not present

## 2020-09-21 LAB — CBC
HCT: 42.2 % (ref 39.0–52.0)
Hemoglobin: 14.5 g/dL (ref 13.0–17.0)
MCH: 32.7 pg (ref 26.0–34.0)
MCHC: 34.4 g/dL (ref 30.0–36.0)
MCV: 95.3 fL (ref 80.0–100.0)
Platelets: 209 10*3/uL (ref 150–400)
RBC: 4.43 MIL/uL (ref 4.22–5.81)
RDW: 13.8 % (ref 11.5–15.5)
WBC: 5.1 10*3/uL (ref 4.0–10.5)
nRBC: 0 % (ref 0.0–0.2)

## 2020-09-21 LAB — BASIC METABOLIC PANEL
Anion gap: 6 (ref 5–15)
BUN: 21 mg/dL (ref 8–23)
CO2: 27 mmol/L (ref 22–32)
Calcium: 10.1 mg/dL (ref 8.9–10.3)
Chloride: 106 mmol/L (ref 98–111)
Creatinine, Ser: 1.08 mg/dL (ref 0.61–1.24)
GFR, Estimated: >60 mL/min (ref >60)
Glucose, Bld: 144 mg/dL — ABNORMAL HIGH (ref 70–99)
Potassium: 4.7 mmol/L (ref 3.5–5.1)
Sodium: 139 mmol/L (ref 135–145)

## 2020-09-21 LAB — LIPID PANEL
Cholesterol: 172 mg/dL (ref 0–200)
HDL: 46 mg/dL (ref >40)
LDL Cholesterol: 93 mg/dL (ref 0–99)
Total CHOL/HDL Ratio: 3.7 RATIO
Triglycerides: 165 mg/dL — ABNORMAL HIGH (ref <150)
VLDL: 33 mg/dL (ref 0–40)

## 2020-09-21 MED ORDER — DAPAGLIFLOZIN PROPANEDIOL 10 MG PO TABS: 10.0000 mg | ORAL_TABLET | Freq: Every day | ORAL | 11 refills | Status: DC

## 2020-09-21 MED ORDER — EMPAGLIFLOZIN 10 MG PO TABS: 10.0000 mg | ORAL_TABLET | Freq: Every day | ORAL | 3 refills | Status: DC

## 2020-09-21 NOTE — Patient Instructions (Addendum)
START Jardiance10 mg , one tab daily  Labs today We will only contact you if something comes back abnormal or we need to make some changes. Otherwise no news is good news!   Labs needed in 10 days  Your physician has recommended that you have a sleep study. This test records several body functions during sleep, including: brain activity, eye movement, oxygen and carbon dioxide blood levels, heart rate and rhythm, breathing rate and rhythm, the flow of air through your mouth and nose, snoring, body muscle movements, and chest and belly movement.  Your physician recommends that you schedule a follow-up appointment in: 6 weeks  in the Advanced Practitioners (PA/NP) Clinic    Do the following things EVERYDAY: Weigh yourself in the morning before breakfast. Write it down and keep it in a log. Take your medicines as prescribed Eat low salt foods--Limit salt (sodium) to 2000 mg per day.  Stay as active as you can everyday Limit all fluids for the day to less than 2 liters  milAt the [facility], you and your health needs are our priority. As part of our continuing mission to provide you with exceptional heart care, we have created designated Provider Care Teams. These Care Teams include your primary Cardiologist (physician) and Advanced Practice Providers (APPs- Physician Assistants and Nurse Practitioners) who all work together to provide you with the care you need, when you need it.   You may see any of the following providers on your designated Care Team at your next follow up: Dr Arvilla Meres Dr Marca Ancona Dr Brandon Melnick, NP Robbie Lis, [place] Mikki Santee Karle Plumber, PharmD   Please be sure to bring in all your medications bottles to every appointment.    If you have any questions or concerns before your next appointment please send [place] a message through [place] or call our office at [phone].    TO LEAVE A MESSAGE FOR THE NURSE  SELECT OPTION 2, PLEASE LEAVE A MESSAGE INCLUDING: YOUR NAME DATE OF BIRTH CALL BACK NUMBER REASON FOR CALL**this is important as we prioritize the call backs  YOU WILL RECEIVE A CALL BACK THE SAME DAY AS LONG AS YOU CALL BEFORE 4:00 PM

## 2020-09-21 NOTE — Progress Notes (Signed)
PCP-Cardiologist: Dr. Allyson Sabal [facility]: Dr. Shirlee Latch   HPI:  80 y.o. with history of cardiomyopathy of uncertain etiology, carotid stenosis, HTN, type 2 diabetes, new diagnosis of CAD, was referred by Dr. Allyson Sabal for evaluation of CHF.  Patient had left CEA in 05/2013.  Cardiolite in 04/2013 prior to CEA was normal.  He generally did well until 01/2020.  Starting at that time, he had 3 episodes of "pneumonia."  In 03/2020, due to respiratory symptoms, he had a CT chest.  This showed 3 vessel coronary calcification and ground glass opacities concerning for pulmonary edema. He saw a pulmonologist, and was thought to have pulmonary edema as a cause of his dyspnea (not a primary pulmonary problem).  He does not smoke.  Echo was done in 06/2020 due to ongoing dyspnea and fatigue, showing EF 25% with normal RV, PASP 86 mmHg.  Cardiolite was then done in 07/2020, showing EF 25% with evidence for prior inferior/inferolateral MI with peri-infarct ischemia.    Seen by Dr. Shirlee Latch 08/2020 for evaluation of CHF. Denied CP, had NYHA II symptoms. He was scheduled for [facility]   Underwent [facility] 09/04/20 showing severe coronary disease with critical RCA stenosis, occluded ostial LCX, L main disease, and preserved cardiac output. He was admitted post cath and CT surgery consulted. Patient declined surgery after discussion of his moderate risk. Cardiac MRI showed substantial viability, but with patient having minimal symptoms, decision made to manage medically.  Plavix was added at discharge.   Recently presented to HF Clinic for post hospitalization HF & CAD follow up with his wife 09/21/20. Overall was feeling fine, now has a right inguinal hernia, no pain with this. No significant exertional dyspnea. Denied CP, dizziness, edema, or PND/Orthopnea. Appetite was ok. Weight at home was 133-135 pounds. Reported taking all medications. BP at home was 120s/60s. He works as a Education officer, environmental.  Today he returns to HF clinic for pharmacist medication  titration. At last visit with APP, Jardiance 10 mg daily was initiated and he was referred to Lipid Clinic for evaluation for PCSK9i or Leqvio.  Unfortunately, he developed hallucinations and Jardiance was discontinued. He reports feeling much better since Jardiance was discontinued. Main complaint is right inguinal hernia, which has limited his activity level. He used to walk 10 minutes three times per day, but is now only walks 9 minutes due to pain. Does note dizziness/lightheadedness which occurs 2-3 times per week. Usually happens at night. When he checks his BP during those events, BP is usually 90-99/50-60s. Normal BP at home is 105-115/60-70s. BP in clinic was 140/76, but he states he has white coat hypertension. His home BP cuff readings have been validated by Dr. Hazle Coca office. No CP or palpitations. Notes he starts to feel worn out by the end of the day. No SOB/DOE. Before the hernia, could walk around the grocery store for ~40 minutes before becoming tired and needing to stop. Weight has been stable at 130-132 lbs at home. He is trying to gain weight. No LEE, PND or orthopnea. He gets new insurance starting September 1st that should help reduce the cost of his meds.     HF Medications: Carvedilol 6.25 qAM/12.5 qPM Entresto 49/51 mg BID Spironolactone 12.5 mg daily  Has the patient been experiencing any side effects to the medications prescribed?  Yes - hallucinations with Jardiance. These have resolved since Jardiance discontinued.   Does the patient have any problems obtaining medications due to transportation or finances?   [employer]  Understanding of  regimen: good Understanding of indications: good Potential of compliance: good Patient understands to avoid NSAIDs. Patient understands to avoid decongestants.    Pertine/nt Lab Values: 09/21/20: Serum creatinine 1.08, BUN 21, Potassium 4.7, Sodium : 139,   Vital Signs: Weight: 132.4 lbs (last clinic weight:  136.6 lbs) Blood pressure: 140/76 - BP at home 105-115/60-70s.   Heart rate: 57   Assessment/Plan: 1. CAD: Cath 09/2020 with severe coronary disease with critical RCA stenosis, occluded ostial LCX, L main disease, and preserved cardiac output. CT surgery consulted for possible CABG and feels he would be at least moderate risk due to his low EF, age, and other comorbidities. After weighing options, pt declined surgery. Plan is for medical management.  If he develops chest pain in the future, intervention on RCA +/- LM would be feasible and his cardiac MRI shows substantial viability.   - Denies ischemic CP. - Continue ASA 81 mg + Plavix + beta blocker. - Intolerant of statins except for lovastatin.  - Referred to lipid clinic for PCSK9i or Leqvio last visit since LDL not at goal <70. 2.  Chronic systolic CHF: Ischemic cardiomyopathy.  Echo in 06/2020 showed EF 20-25% with normal RV, interestingly, the PA systolic pressure estimation was severely elevated at 86 mmHg. RHC 09/2020 with low filling pressure, preserved cardiac output, actually no pulmonary hypertension.   - NYHA II, he is not volume overloaded today. - Does not need Lasix at this time. Patient states it lowers his BP significantly. - Continue carvedilol 6.25 mg qam/ 12.5 mg qpm  - Continue Entresto 49/51 mg BID. Will not increase given lower home BPs and significant dizziness.  - Continue spironolactone 12.5 mg daily. Asked him to follow low K diet (eats lots of bananas) and will consider increasing spironolactone at next visit pending repeat BMET at that time.   - Did not tolerate Jardiance (hallucinations) - added to allergy list. 3. Carotid stenosis: S/p left CEA.   - Carotid dopplers followed by [facility]. - Goal LDL < 70.   - Patient is on lovastatin, says he cannot take other statins. Referred to lipid clinic for PCSK9i or Leqvio. - Continue ASA 81 daily. 4. HTN: Controlled on current regimen.  5. Snoring/fatigue: Previously referred  for home sleep study. 6. Type 2DM:  - Continue metformin  Follow up 4 weeks with APP Clinic   Karle Plumber, PharmD, BCPS, BCCP, CPP Heart Failure Clinic Pharmacist [phone]

## 2020-09-24 ENCOUNTER — Telehealth (HOSPITAL_COMMUNITY): Payer: Self-pay | Admitting: Cardiology

## 2020-09-24 DIAGNOSIS — E782 Mixed hyperlipidemia: Secondary | ICD-10-CM

## 2020-09-24 NOTE — Telephone Encounter (Signed)
Patient called.  Patient aware, patient agreeable to referral

## 2020-09-24 NOTE — Telephone Encounter (Signed)
-----   Message from Jacklynn Ganong, [place] sent at 09/21/2020 12:31 PM EDT ----- Labs stable however LDL>70, Would like to refer him to the Lipid Clinic if he is agreeable. (Goal LDL<70 and he is intolerant to most statins)

## 2020-09-28 ENCOUNTER — Telehealth (HOSPITAL_COMMUNITY): Payer: Self-pay | Admitting: *Deleted

## 2020-09-28 NOTE — Telephone Encounter (Signed)
Pt called to report that he started the Jardiance last Tue and then develop hallucinations. He states they have gotten worse the past 2 nights and he did not take the [place] today. Advised to remain off Jardiance for now, keep appt 8/29 with our pharmacist, if hallucinations not getting better to let us know, pt agreeable.

## 2020-09-30 ENCOUNTER — Other Ambulatory Visit (HOSPITAL_COMMUNITY): Payer: PPO

## 2020-10-01 ENCOUNTER — Other Ambulatory Visit (HOSPITAL_COMMUNITY): Payer: PPO

## 2020-10-05 ENCOUNTER — Other Ambulatory Visit: Payer: Self-pay

## 2020-10-05 ENCOUNTER — Ambulatory Visit (HOSPITAL_COMMUNITY)
Admission: RE | Admit: 2020-10-05 | Discharge: 2020-10-05 | Disposition: A | Discharge status: Home / Self Care | Payer: PPO | Source: Ambulatory Visit | Attending: Internal Medicine | Admitting: Internal Medicine

## 2020-10-05 VITALS — BP 140/76 | HR 57 | Wt 132.4 lb

## 2020-10-05 DIAGNOSIS — I255 Ischemic cardiomyopathy: Secondary | ICD-10-CM | POA: Diagnosis not present

## 2020-10-05 DIAGNOSIS — Z7902 Long term (current) use of antithrombotics/antiplatelets: Secondary | ICD-10-CM | POA: Diagnosis not present

## 2020-10-05 DIAGNOSIS — Z7982 Long term (current) use of aspirin: Secondary | ICD-10-CM | POA: Insufficient documentation

## 2020-10-05 DIAGNOSIS — E119 Type 2 diabetes mellitus without complications: Secondary | ICD-10-CM | POA: Insufficient documentation

## 2020-10-05 DIAGNOSIS — Z7984 Long term (current) use of oral hypoglycemic drugs: Secondary | ICD-10-CM | POA: Diagnosis not present

## 2020-10-05 DIAGNOSIS — K4090 Unilateral inguinal hernia, without obstruction or gangrene, not specified as recurrent: Secondary | ICD-10-CM | POA: Insufficient documentation

## 2020-10-05 DIAGNOSIS — I110 Hypertensive heart disease with heart failure: Secondary | ICD-10-CM | POA: Insufficient documentation

## 2020-10-05 DIAGNOSIS — I2510 Atherosclerotic heart disease of native coronary artery without angina pectoris: Secondary | ICD-10-CM | POA: Diagnosis not present

## 2020-10-05 DIAGNOSIS — I6523 Occlusion and stenosis of bilateral carotid arteries: Secondary | ICD-10-CM | POA: Insufficient documentation

## 2020-10-05 DIAGNOSIS — Z79899 Other long term (current) drug therapy: Secondary | ICD-10-CM | POA: Diagnosis not present

## 2020-10-05 DIAGNOSIS — I5022 Chronic systolic (congestive) heart failure: Secondary | ICD-10-CM | POA: Diagnosis not present

## 2020-10-05 NOTE — Patient Instructions (Addendum)
It was a pleasure seeing you today!  MEDICATIONS: -No medication changes today -Call if you have questions about your medications.   NEXT APPOINTMENT: Return to clinic in 4 weeks with APP Clinic.  In general, to take care of your heart failure: -Limit your fluid intake to 2 Liters (half-gallon) per day.   -Limit your salt intake to ideally 2-3 grams (2000-3000 mg) per day. -Weigh yourself daily and record, and bring that "weight diary" to your next appointment.  (Weight gain of 2-3 pounds in 1 day typically means fluid weight.) -The medications for your heart are to help your heart and help you live longer.   -Please contact us before stopping any of your heart medications.  Call the clinic at [phone] with questions or to reschedule future appointments.

## 2020-10-06 ENCOUNTER — Ambulatory Visit: Payer: PPO

## 2020-10-07 ENCOUNTER — Ambulatory Visit: Payer: PPO | Admitting: Cardiovascular Disease

## 2020-10-07 ENCOUNTER — Other Ambulatory Visit: Payer: Self-pay

## 2020-10-07 ENCOUNTER — Encounter: Payer: Self-pay | Admitting: Cardiovascular Disease

## 2020-10-07 DIAGNOSIS — I5020 Unspecified systolic (congestive) heart failure: Secondary | ICD-10-CM | POA: Diagnosis not present

## 2020-10-07 DIAGNOSIS — E782 Mixed hyperlipidemia: Secondary | ICD-10-CM | POA: Diagnosis not present

## 2020-10-07 DIAGNOSIS — I10 Essential (primary) hypertension: Secondary | ICD-10-CM | POA: Diagnosis not present

## 2020-10-07 NOTE — Assessment & Plan Note (Signed)
History of left carotid endarterectomy performed by Dr. Darrick Penna back in 2015 which I cleared him for preoperatively.  Carotid Dopplers performed 10/30/2019 revealed a widely patent endarterectomy site with moderate right ICA stenosis.

## 2020-10-07 NOTE — Assessment & Plan Note (Signed)
History of essential hypertension a blood pressure measured today at 177/76.  He is on carvedilol and Entresto.  He does have "whitecoat hypertension" and checks his blood pressure at home which runs on average 120/70.

## 2020-10-07 NOTE — Assessment & Plan Note (Signed)
History of ischemic cardiomyopathy with an EF in the 20% range by 2D echo performed 06/24/2020.  He did have a right left heart cath by Dr. Shirlee Latch 09/08/2020 revealing left main/three-vessel disease.  He was evaluated for CABG and turndown for this.  He did not wish to pursue catheter-based therapy and it was ultimately decided to treat him medically.  He has no symptoms of chest pain or shortness of breath.

## 2020-10-07 NOTE — Progress Notes (Addendum)
03/09/2021 David Sellers   Apr 27, 1940  [id number]  Primary Physician Irena Reichmann, DO Primary Cardiologist: Runell Gess MD Nicholes Calamity, [place]  HPI:  David Sellers is a 80 y.o. 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 Fabienne Bruns for cardiovascular evaluation and preoperative screening prior to elective left carotid endarterectomy. I last saw him in the office 05/13/2020.Marland Kitchen His cardiovascular factor profile is positive for hypertension, dyslipidemia, and family history of heart disease with a father who died of a myocardial infarction in his 51s. He has never had a heart attack or stroke. He denies chest pain or shortness of breath.  He did have pain in his left neck and had incidentally found moderate left internal carotid artery stenosis. A Myoview stress test performed for preoperative clearance 04/23/13 was entirely normal. He underwent uncomplicated left carotid endarterectomy by Dr. Darrick Penna on 05/08/13 with excellent operative result although the patient does have some left facial numbness as result. SinceI saw him a year ago he has remained stable.   He is been under a lot of stress lately with his wife having kidney infection him having upper respiratory tract infection although he checks his blood pressure at home and it runs in the 135/70 range.     He has been diagnosed with diabetes and is seeing an endocrinologist.  He is placed on a diet is lost 15 pounds.    He has taken 3 Covid shots and developed pneumonia after these.  He has had a chronic cough for months now followed by pulmonologist.  A chest CT performed 03/28/2020 showed small bilateral pleural effusions, three-vessel coronary atherosclerosis with widespread moderate patchy regions of groundglass opacity and consolidation involving all lung lobes.  A 2D echocardiogram performed 06/24/2020 revealed severe LV dysfunction with an EF in the 20 to 25%  range.  I did refer him to Dr. Sherlie Ban who ultimately performed right left heart cath on him 09/08/2020 revealing left main/three-vessel disease with low filling pressures.  He was evaluated for CABG and PCI and he decided only to pursue medical therapy.  He is completely asymptomatic.  Does have a right inguinal hernia which he is symptomatic from and wishes to have this surgically corrected by Dr. Gerrit Friends.  He is at high risk for this given his LV dysfunction and severe CAD.   Current Meds  Medication Sig   aspirin EC 81 MG tablet Take 81 mg by mouth every morning.   carvedilol (COREG) 6.25 MG tablet Take 1 tablet each morning and 2 tablets each night.   cetirizine (ZYRTEC) 10 MG tablet Take 10 mg by mouth at bedtime.   clopidogrel (PLAVIX) 75 MG tablet Take 1 tablet (75 mg total) by mouth daily.   Krill Oil 500 MG CAPS Take 500 mg by mouth at bedtime.   metFORMIN (GLUCOPHAGE-XR) 500 MG 24 hr tablet Take 1 tablet (500 mg total) by mouth 2 (two) times daily with breakfast and lunch.   ONETOUCH VERIO test strip 2 (two) times daily.   [DISCONTINUED] acetaminophen (TYLENOL) 650 MG CR tablet Take 1,300 mg by mouth in the morning and at bedtime.   [DISCONTINUED] Biotin 1000 MCG CHEW Chew 1,000 mcg by mouth at bedtime.   [DISCONTINUED] doxazosin (CARDURA) 4 MG tablet Take 4 mg by mouth every evening.   [DISCONTINUED] lovastatin (MEVACOR) 20 MG tablet Take 20 mg by mouth at bedtime.   [DISCONTINUED] pantoprazole (PROTONIX) 40 MG  tablet Take 1 tablet (40 mg total) by mouth daily.   [DISCONTINUED] polyvinyl alcohol (LIQUIFILM TEARS) 1.4 % ophthalmic solution Place 1 drop into both eyes 3 (three) times daily as needed for dry eyes.   [DISCONTINUED] sacubitril-valsartan (ENTRESTO) 49-51 MG Take 1 tablet by mouth 2 (two) times daily.   [DISCONTINUED] spironolactone (ALDACTONE) 25 MG tablet Take 0.5 tablets (12.5 mg total) by mouth daily.   [DISCONTINUED] SUPER B COMPLEX/C PO Take 1 tablet by mouth daily.      Allergies  Allergen Reactions   Albuterol Anaphylaxis    Per patient and daughter   Meloxicam Itching, Palpitations and Other (See Comments)    Headaches, Mood swings.   Morphine And Related Nausea And Vomiting   Statins Other (See Comments)    Myalgias. Severe arthritic response. Tolerates Lovastatin.    Cholestoff [Plant Sterols And Stanols] Other (See Comments)    Muscle aches   Nsaids Other (See Comments)    Aleve ,  Myalgia   Atorvastatin     Severe myalgias   Breo Ellipta [Fluticasone Furoate-Vilanterol]     thrush   Cortisone Hypertension    Patient states cortisone injections he has received for his knees cause hypertension and hyperglycemia   Cortizone-10 Feminine Itch [Hydrocortisone]     "Feeling of heart attack"   Darvon [Propoxyphene] Other (See Comments)    hallucinate   Doxycycline Cough   Emetrol     Other reaction(s): Hallucinate   Glimepiride Itching   Hydrochlorothiazide     Other reaction(s): Low Blood Pressure   Indomethacin     Neck pain flare   Jardiance [Empagliflozin]     hallucinations   Naproxen     Other reaction(s): Increased Liver Count   Penicillins     *positive allergy test*   Pravastatin     myalgias   Repatha [Evolocumab] Other (See Comments)    hyperglycemia   Simvastatin     myalgias   Sulfa Antibiotics Nausea And Vomiting   Terbinafine Diarrhea   Tramadol Other (See Comments)    Hallucinations     Social History   Socioeconomic History   Marital status: Married    Spouse name: Not on file   Number of children: Not on file   Years of education: Not on file   Highest education level: Not on file  Occupational History   Not on file  Tobacco Use   Smoking status: Never   Smokeless tobacco: Never  Vaping Use   Vaping Use: Never used  Substance and Sexual Activity   Alcohol use: No   Drug use: No   Sexual activity: Yes  Other Topics Concern   Not on file  Social History Narrative   Not on file   Social  Determinants of Health   Financial Resource Strain: Not on file  Food Insecurity: Not on file  Transportation Needs: Not on file  Physical Activity: Not on file  Stress: Not on file  Social Connections: Not on file  Intimate Partner Violence: Not on file     Review of Systems: General: negative for chills, fever, night sweats or weight changes.  Cardiovascular: negative for chest pain, dyspnea on exertion, edema, orthopnea, palpitations, paroxysmal nocturnal dyspnea or shortness of breath Dermatological: negative for rash Respiratory: negative for cough or wheezing Urologic: negative for hematuria Abdominal: negative for nausea, vomiting, diarrhea, bright red blood per rectum, melena, or hematemesis Neurologic: negative for visual changes, syncope, or dizziness All other systems reviewed and are otherwise negative  except as noted above.    Blood pressure (!) 177/76, pulse 66, height 5\' 6"  (1.676 m), weight 134 lb 3.2 oz (60.9 kg), SpO2 99 %.  General appearance: alert and no distress Neck: no adenopathy, no JVD, supple, symmetrical, trachea midline, thyroid not enlarged, symmetric, no tenderness/mass/nodules, and soft right carotid bruit Lungs: clear to auscultation bilaterally Heart: regular rate and rhythm, S1, S2 normal, no murmur, click, rub or gallop Extremities: extremities normal, atraumatic, no cyanosis or edema Pulses: 2+ and symmetric Skin: Skin color, texture, turgor normal. No rashes or lesions Neurologic: Grossly normal  EKG not performed today  ASSESSMENT AND PLAN:   Occlusion and stenosis of carotid artery without mention of cerebral infarction History of left carotid endarterectomy performed by Dr. back in 2015 which I cleared him for preoperatively.  Carotid Dopplers performed 10/30/2019 revealed a widely patent endarterectomy site with moderate right ICA stenosis.  Essential hypertension History of essential hypertension a blood pressure measured today  at 177/76.  He is on carvedilol and Entresto.  He does have "whitecoat hypertension" and checks his blood pressure at home which runs on average 120/70.  Hyperlipidemia History of hyperlipidemia on lovastatin with lipid profile performed 09/21/2020 revealing total cholesterol of 172, LDL of 93 and HDL 46.  He is not at goal for secondary prevention.  His most recent lipid profile performed 03/01/2021 revealed total cholesterol of 167, LDL of 92 and HDL of 48.  HFrEF (heart failure with reduced ejection fraction) (HCC) History of ischemic cardiomyopathy with an EF in the 20% range by 2D echo performed 06/24/2020.  He did have a right left heart cath by Dr. 06/26/2020 09/08/2020 revealing left main/three-vessel disease.  He was evaluated for CABG and turndown for this.  He did not wish to pursue catheter-based therapy and it was ultimately decided to treat him medically.  He has no symptoms of chest pain or shortness of breath.     11/08/2020 MD FACP,FACC,FAHA, [facility] 03/09/2021 4:18 PM

## 2020-10-07 NOTE — Assessment & Plan Note (Addendum)
History of hyperlipidemia on lovastatin with lipid profile performed 09/21/2020 revealing total cholesterol of 172, LDL of 93 and HDL 46.  He is not at goal for secondary prevention.  His most recent lipid profile performed 03/01/2021 revealed total cholesterol of 167, LDL of 92 and HDL of 48.

## 2020-10-07 NOTE — Patient Instructions (Signed)
Medication Instructions:  The current medical regimen is effective;  continue present plan and medications.  *If you need a refill on your cardiac medications before your next appointment, please call your pharmacy*    Follow-Up: At [facility], you and your health needs are our priority.  As part of our continuing mission to provide you with exceptional heart care, we have created designated Provider Care Teams.  These Care Teams include your primary Cardiologist (physician) and Advanced Practice Providers (APPs -  Physician Assistants and Nurse Practitioners) who all work together to provide you with the care you need, when you need it.  We recommend signing up for the patient portal called "MyChart".  Sign up information is provided on this After Visit Summary.  MyChart is used to connect with patients for Virtual Visits (Telemedicine).  Patients are able to view lab/test results, encounter notes, upcoming appointments, etc.  Non-urgent messages can be sent to your provider as well.   To learn more about what you can do with MyChart, go to [email or web address].    Your next appointment:   3 month(s)  The format for your next appointment:   In Person  Provider:   Jonathan Berry, MD     

## 2020-10-14 ENCOUNTER — Telehealth: Payer: Self-pay | Admitting: *Deleted

## 2020-10-14 DIAGNOSIS — K4090 Unilateral inguinal hernia, without obstruction or gangrene, not specified as recurrent: Secondary | ICD-10-CM | POA: Diagnosis not present

## 2020-10-14 NOTE — Telephone Encounter (Signed)
   [facility] Pre-operative Risk Assessment    Patient Name: David Sellers  DOB: 1940-02-17 MRN: [id number]  Request for surgical clearance:  What type of surgery is being performed? HERNIA SURGERY   When is this surgery scheduled? TBD   What type of clearance is required (medical clearance vs. Pharmacy clearance to hold med vs. Both)? BOTH   Are there any medications that need to be held prior to surgery and how long? PLAVIX   Practice name and name of physician performing surgery? [facility]   What is the office phone number? [phone]   7.   What is the office fax number? [place].   Anesthesia type (None, local, MAC, general) ? GENERAL

## 2020-10-15 NOTE — Telephone Encounter (Signed)
Dr. Allyson Sabal  This patient has 80% left main on recent heart cath. CT surgery was consulted and he elected to pursue medical therapy. You saw him on 10/07/20.   We are asked for clearance for hernia surgery and to hold plavix. He is on DAPT.   Is he cleared for surgery? Can he hold plavix?

## 2020-10-15 NOTE — Telephone Encounter (Signed)
   Name: David Sellers  DOB: [birth year]  MRN: [id number]   Primary Cardiologist: Nanetta Batty, MD  Chart reviewed as part of pre-operative protocol coverage. Patient was contacted 10/15/2020 in reference to pre-operative risk assessment for pending surgery as outlined below.  David Sellers was last seen on 10/07/20 by Dr. Allyson Sabal. He had a recent heart cath that revealed 80% left main stenosis as well as three vessel disease. Pt was evaluated by CT surgery but ultimately turned down CABG. Because of his high risk disease, I reached out to Dr. Allyson Sabal for further guidance.  Per Dr. Allyson Sabal:  High risk surgical candidate.  I prefer that surgery not be done under general anesthesia but rather regional block.  The patient has not been instrumented, does not have a stent.  Okay to hold Plavix.  He has left main/three-vessel disease.    I will route this recommendation to the requesting party via Epic fax function and remove from pre-op pool. Please call with questions.  David Rutherford Viraaj Vorndran, PA 10/15/2020, 9:57 AM

## 2020-10-20 ENCOUNTER — Encounter (INDEPENDENT_AMBULATORY_CARE_PROVIDER_SITE_OTHER): Payer: HMO | Admitting: Cardiology

## 2020-10-20 DIAGNOSIS — I5023 Acute on chronic systolic (congestive) heart failure: Secondary | ICD-10-CM | POA: Diagnosis not present

## 2020-10-21 ENCOUNTER — Other Ambulatory Visit: Payer: Self-pay

## 2020-10-21 ENCOUNTER — Ambulatory Visit (INDEPENDENT_AMBULATORY_CARE_PROVIDER_SITE_OTHER): Payer: HMO | Admitting: Pharmacist

## 2020-10-21 ENCOUNTER — Ambulatory Visit: Payer: HMO

## 2020-10-21 VITALS — BP 178/98 | HR 60 | Resp 16 | Ht 66.0 in | Wt 134.4 lb

## 2020-10-21 DIAGNOSIS — I5020 Unspecified systolic (congestive) heart failure: Secondary | ICD-10-CM

## 2020-10-21 DIAGNOSIS — I6529 Occlusion and stenosis of unspecified carotid artery: Secondary | ICD-10-CM | POA: Diagnosis not present

## 2020-10-21 DIAGNOSIS — E782 Mixed hyperlipidemia: Secondary | ICD-10-CM | POA: Diagnosis not present

## 2020-10-21 NOTE — Procedures (Signed)
   Sleep Study Report  Patient Information Study Date: 10/20/20 Patient Name: David Sellers Patient ID: [id number] Birth Date: 2040/12/13 Age: 80 Gender: Male Referring Physician: Marca Ancona, MD  TEST DESCRIPTION: Home sleep apnea testing was completed using the WatchPat, a Type 1 device, utilizing peripheral arterial tonometry (PAT), chest movement, actigraphy, pulse oximetry, pulse rate, body position and snore. AHI was calculated with apnea and hypopnea using valid sleep time as the denominator. RDI includes apneas, hypopneas, and RERAs. The data acquired and the scoring of sleep and all associated events were performed in accordance with the recommended standards and specifications as outlined in the AASM Manual for the Scoring of Sleep and Associated Events 2.2.0 (2015).  FINDINGS: 1. No evidence of Obstructive Sleep Apnea with AHI 0.7/hr. 2. No Central Sleep Apnea. 3. Oxygen desaturations as low as 92%. 4. Mild snoring was present. O2 sats were < 88% for 0 minutes. 5. Total sleep time was 5 hrs and 36 min. 6. 15.1% of total sleep time was spent in REM sleep. 7. Normal sleep onset latency at 26 min. 8. Prolonged REM sleep onset latency at . 9. Total awakenings were 13.  DIAGNOSIS: Normal study with no significant sleep disordered breathing.  RECOMMENDATIONS: 1. Normal study with no significant sleep disordered breathing.  2. Healthy sleep recommendations include: adequate nightly sleep (normal 7-9 hrs/night), avoidance of caffeine after noon and alcohol near bedtime, and maintaining a sleep environment that is cool, dark and quiet.  3. Weight loss for overweight patients is recommended.  4. Snoring recommendations include: weight loss where appropriate, side sleeping, and avoidance of alcohol before bed.  5. Operation of motor vehicle or dangerous equipment must be avoided when feeling drowsy, excessively sleepy, or mentally fatigued.  6. An ENT consultation  which may be useful for specific causes of and possible treatment of bothersome snoring   7. Weight loss may be of benefit in reducing the severity of snoring.   Signature: Electronically Signed: 10/21/20 Armanda Magic, MD; [facility]; Diplomat, American Board of Sleep Medicine Report prepared by: Armanda Magic

## 2020-10-21 NOTE — Progress Notes (Signed)
Patient ID: David Sellers                 DOB: [birth year]                    MRN: [id number]     HPI: David Sellers is a 80 y.o. male patient referred to lipid clinic by Dr Allyson Sabal. PMH is significant for HLD, HTN, and family history of CAD.  Has a history of intolerance to multiple statins. Can only tolerate lovastatin 20mg .  Patient presents today with wife in good spirits.  Is scheduled for hernia surgery (unsure of date) which is his main priority at the moment.   Patient reports he feels well.  Denies chest pain, SOB, dizziness.  Had right/left heart cath on 09/08/20 and recovered well.    Current Medications: lovastatin 20mg , krill oil 500mg  daily Intolerances:  atorvastatin, pravastatin, simvastatin (myalgias) Risk Factors: family history, HTN, HLD LDL goal: <70  Labs: TC 172, Trigs 165, HDL 46, LDL 93 (09/21/20 - lovastatin 20)  Past Medical History:  Diagnosis Date   Arthritis    Asthma    only has flareup with smelly perfume    BPH (benign prostatic hyperplasia)    Carotid artery occlusion    right ICA 50% stenosis   Carotodynia    Cataract    immature to the right eye   Diabetes mellitus without complication (HCC)    borderline   GERD (gastroesophageal reflux disease)    takes Omeprazole daily   Headache(784.0)    r/t arthritis in neck    Hemorrhoids    History of bronchitis    last time 10-2yrs ago   History of colon polyps    Hyperlipidemia    no meds since Jan 1   Hypertension    takes Verapamil,Lisinopril,and Cardura daily   Joint pain    Legally blind in left eye, as defined in 09/23/20    hit by a baseball   Low back pain    reason unknown   Pneumonia    3 times in a row   Prolapsed hemorrhoids    Urinary frequency    sees Dr.Tannenbaum    Current Outpatient Medications on File Prior to Visit  Medication Sig Dispense Refill   acetaminophen (TYLENOL) 650 MG CR tablet Take 1,300 mg by mouth in the morning and at bedtime.     aspirin EC 81 MG tablet Take  81 mg by mouth every morning.     Biotin 1000 MCG CHEW Chew 1,000 mcg by mouth at bedtime.     carvedilol (COREG) 6.25 MG tablet Take 1 tablet each morning and 2 tablets each night. 270 tablet 3   cetirizine (ZYRTEC) 10 MG tablet Take 10 mg by mouth at bedtime.     clopidogrel (PLAVIX) 75 MG tablet Take 1 tablet (75 mg total) by mouth daily. 30 tablet 11   doxazosin (CARDURA) 4 MG tablet Take 4 mg by mouth daily.     Krill Oil 500 MG CAPS Take 500 mg by mouth at bedtime.     lovastatin (MEVACOR) 20 MG tablet Take 20 mg by mouth at bedtime.     metFORMIN (GLUCOPHAGE-XR) 500 MG 24 hr tablet Take 1 tablet (500 mg total) by mouth 2 (two) times daily with breakfast and lunch.     ONETOUCH VERIO test strip 2 (two) times daily.     pantoprazole (PROTONIX) 40 MG tablet Take 1 tablet (40 mg total) by mouth daily. 30  tablet 5   polyvinyl alcohol (LIQUIFILM TEARS) 1.4 % ophthalmic solution Place 1 drop into both eyes 3 (three) times daily as needed for dry eyes.     sacubitril-valsartan (ENTRESTO) 49-51 MG Take 1 tablet by mouth 2 (two) times daily. 60 tablet 6   spironolactone (ALDACTONE) 25 MG tablet Take 0.5 tablets (12.5 mg total) by mouth daily. 45 tablet 3   SUPER B COMPLEX/C PO Take 1 tablet by mouth daily.     No current facility-administered medications on file prior to visit.    Allergies  Allergen Reactions   Albuterol Anaphylaxis    Per patient and daughter   Meloxicam Itching, Palpitations and Other (See Comments)    Headaches, Mood swings.   Morphine And Related Nausea And Vomiting   Statins Other (See Comments)    Myalgias. Severe arthritic response. Tolerates Lovastatin.    Cholestoff [Plant Sterols And Stanols] Other (See Comments)    Muscle aches   Nsaids Other (See Comments)    Aleve ,  Myalgia   Cortisone Hypertension    Patient states cortisone injections he has received for his knees cause hypertension and hyperglycemia   Darvon [Propoxyphene] Other (See Comments)     hallucinate   Doxycycline     Other reaction(s): Coughing   Glimepiride Itching   Hydrochlorothiazide     Other reaction(s): Low Blood Pressure   Indomethacin     Neck pain flare   Jardiance [Empagliflozin]     hallucinations   Penicillins     *positive allergy test*   Sulfa Antibiotics Nausea And Vomiting   Terbinafine     Other reaction(s): diarrhea   Tramadol Other (See Comments)    Hallucinations     Assessment/Plan:  1. Hyperlipidemia - LDL 93 which is above goal of <70.  Advised patient to continue lovastatin 20mg . Would prefer a higher intensity statin, however patient is intolerant.  Using demo pen, educated patient on mechanism of action of Repatha, storage, site selection, administration and possible adverse effects.  Patient voiced understanding.  Wants to have his hernia surgery first and then recover before starting new medication.  Advised that was fine. Advised for patient to call so we can complete PA and send in Rx.  Patient voiced understanding.  Continue lovastatin 20mg  daily  [place], PharmD, BCACP, CDCES, CPP [facility] [address] [place], [address] [place] Phone: [phone]; Fax: [phone] 10/21/2020 5:02 PM

## 2020-10-21 NOTE — Patient Instructions (Signed)
It was nice meeting you today!  We would like your LDL (bad cholesterol) to be less than 70  The medications we recommend are called Repatha 140mg  or Praluent 75mg   Both are injections you would give yourself once every 2 weeks.  If you do decide to try it, we will recheck your lab work in 2-3 months  Continue your lovastatin 20mg  once a day  Please call with any questions or if you would like to start one of the new medications  , PharmD, BCACP, CDCES, CPP [facility] [address] [place], [facility] [address] Phone: [phone]; Fax: [phone] 10/21/2020 10:46 AM

## 2020-10-29 ENCOUNTER — Telehealth: Payer: Self-pay | Admitting: *Deleted

## 2020-10-29 NOTE — Telephone Encounter (Addendum)
The patient has been notified of the result and verbalized understanding.  All questions (if any) were answered. David Sellers, CMA 10/29/2020 2:41 PM    Pt is aware and agreeable to normal results.  Patient does not think another test is necessary.

## 2020-10-29 NOTE — Telephone Encounter (Signed)
-----   Message from Quintella Reichert, MD sent at 10/21/2020  7:45 PM EDT ----- Normal home sleep study so in lab PSG will be ordered

## 2020-10-29 NOTE — Telephone Encounter (Signed)
Patient is following up regarding sleep study results.  He would like to continue going over his results. He states when speaking with the sleep coordinator he received another call and switched to the other line. I was unable to contact sleep coordinator for transfer.

## 2020-10-30 ENCOUNTER — Other Ambulatory Visit: Payer: Self-pay

## 2020-10-30 ENCOUNTER — Ambulatory Visit: Payer: HMO | Admitting: Physician Assistant

## 2020-10-30 ENCOUNTER — Ambulatory Visit (HOSPITAL_COMMUNITY)
Admission: RE | Admit: 2020-10-30 | Discharge: 2020-10-30 | Disposition: A | Discharge status: Home / Self Care | Payer: HMO | Source: Ambulatory Visit | Attending: Vascular Surgery | Admitting: Vascular Surgery

## 2020-10-30 VITALS — BP 167/84 | HR 53 | Resp 16 | Ht 66.0 in | Wt 133.0 lb

## 2020-10-30 DIAGNOSIS — Z9889 Other specified postprocedural states: Secondary | ICD-10-CM | POA: Diagnosis not present

## 2020-10-30 DIAGNOSIS — I779 Disorder of arteries and arterioles, unspecified: Secondary | ICD-10-CM | POA: Insufficient documentation

## 2020-10-30 NOTE — Progress Notes (Signed)
HISTORY AND PHYSICAL     CC:  follow up. Requesting Provider:  Irena Reichmann, DO  HPI: This is a 80 y.o. male here for follow up for carotid artery stenosis.  Pt is s/p left CEA for asymptomatic carotid artery stenosis on 05/08/2013 by Dr. Darrick Penna.    Pt was last seen September 2021 and at that time he was doing well without neurologic symptoms.    Pt returns today for follow up accompanied by his wife of 58 years.   Pt denies any amaurosis fugax, speech difficulties, weakness, numbness, paralysis or clumsiness or facial droop.  His wife states that when he had his heart cath last month, he did have some speech difficulties, stating he had not had anything to drink for while and when he was able to drink water, this cleared right up.  This happened one other time with the same circumstances.  His cardiac cath revealed 3 vessel CAD and it was decided to be treated medically. He does have an EF around 20% by echo in May.  He has been placed on Entresto for his heart failure.   He has plans for hernia repair by Dr. Gerrit Friends and has been cleared by Dr. Allyson Sabal.  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  He is [facility].   The pt is on a statin for cholesterol management.  The pt is on a daily aspirin.   Other AC:  Plavix The pt is on BB, ARB for hypertension.   The pt is  diabetic.   Tobacco hx:  never  Pt does not have family hx of AAA.  Past Medical History:  Diagnosis Date   Arthritis    Asthma    only has flareup with smelly perfume    BPH (benign prostatic hyperplasia)    Carotid artery occlusion    right ICA 50% stenosis   Carotodynia    Cataract    immature to the right eye   Diabetes mellitus without complication (HCC)    borderline   GERD (gastroesophageal reflux disease)    takes Omeprazole daily   Headache(784.0)    r/t arthritis in neck    Hemorrhoids    History of bronchitis    last time 10-87yrs ago   History of colon polyps    Hyperlipidemia    no meds since Jan 1    Hypertension    takes Verapamil,Lisinopril,and Cardura daily   Joint pain    Legally blind in left eye, as defined in [place]    hit by a baseball   Low back pain    reason unknown   Pneumonia    3 times in a row   Prolapsed hemorrhoids    Urinary frequency    sees Dr.Tannenbaum    Past Surgical History:  Procedure Laterality Date   CAROTID ENDARTERECTOMY Left 05/08/13   COLONOSCOPY     EGD with dilitation     ENDARTERECTOMY Left 05/08/2013   Procedure: LEFT CAROTID ARTERY ENDARTERECTOMY WITH DACRON PATCH ANGIOPLASTY;  Surgeon: Sherren Kerns, MD;  Location: [facility] OR;  Service: Vascular;  Laterality: Left;   EYE SURGERY     HEMORRHOID SURGERY N/A 08/13/2019   Procedure: OPEN HEMORRHOIDECTOMY;  Surgeon: Darnell Level, MD;  Location: [facility];  Service: General;  Laterality: N/A;  LMA   HERNIA REPAIR     x 2   RETINAL DETACHMENT SURGERY     retinal laser surgery Left    plate placed   RIGHT/LEFT HEART  CATH AND CORONARY ANGIOGRAPHY N/A 09/08/2020   Procedure: RIGHT/LEFT HEART CATH AND CORONARY ANGIOGRAPHY;  Surgeon: Laurey Morale, MD;  Location: [facility] INVASIVE CV LAB;  Service: Cardiovascular;  Laterality: N/A;    Allergies  Allergen Reactions   Albuterol Anaphylaxis    Per patient and daughter   Meloxicam Itching, Palpitations and Other (See Comments)    Headaches, Mood swings.   Morphine And Related Nausea And Vomiting   Statins Other (See Comments)    Myalgias. Severe arthritic response. Tolerates Lovastatin.    Cholestoff [Plant Sterols And Stanols] Other (See Comments)    Muscle aches   Nsaids Other (See Comments)    Aleve ,  Myalgia   Atorvastatin     Severe myalgias   Breo Ellipta [Fluticasone Furoate-Vilanterol]     thrush   Cortisone Hypertension    Patient states cortisone injections he has received for his knees cause hypertension and hyperglycemia   Cortizone-10 Feminine Itch [Hydrocortisone]     "Feeling of heart attack"   Darvon [Propoxyphene] Other  (See Comments)    hallucinate   Doxycycline     Other reaction(s): Coughing   Emetrol     Other reaction(s): Hallucinate   Glimepiride Itching   Hydrochlorothiazide     Other reaction(s): Low Blood Pressure   Indomethacin     Neck pain flare   Jardiance [Empagliflozin]     hallucinations   Naproxen     Other reaction(s): Increased Liver Count   Penicillins     *positive allergy test*   Pravastatin     myalgias   Simvastatin     myalgias   Sulfa Antibiotics Nausea And Vomiting   Terbinafine     Other reaction(s): diarrhea   Terbinafine And Related     diarrhea   Tramadol Other (See Comments)    Hallucinations     Current Outpatient Medications  Medication Sig Dispense Refill   acetaminophen (TYLENOL) 650 MG CR tablet Take 1,300 mg by mouth in the morning and at bedtime.     aspirin EC 81 MG tablet Take 81 mg by mouth every morning.     Biotin 1000 MCG CHEW Chew 1,000 mcg by mouth at bedtime.     carvedilol (COREG) 6.25 MG tablet Take 1 tablet each morning and 2 tablets each night. 270 tablet 3   cetirizine (ZYRTEC) 10 MG tablet Take 10 mg by mouth at bedtime.     clopidogrel (PLAVIX) 75 MG tablet Take 1 tablet (75 mg total) by mouth daily. 30 tablet 11   doxazosin (CARDURA) 4 MG tablet Take 4 mg by mouth daily.     Krill Oil 500 MG CAPS Take 500 mg by mouth at bedtime.     lovastatin (MEVACOR) 20 MG tablet Take 20 mg by mouth at bedtime.     metFORMIN (GLUCOPHAGE-XR) 500 MG 24 hr tablet Take 1 tablet (500 mg total) by mouth 2 (two) times daily with breakfast and lunch.     ONETOUCH VERIO test strip 2 (two) times daily.     pantoprazole (PROTONIX) 40 MG tablet Take 1 tablet (40 mg total) by mouth daily. 30 tablet 5   polyvinyl alcohol (LIQUIFILM TEARS) 1.4 % ophthalmic solution Place 1 drop into both eyes 3 (three) times daily as needed for dry eyes.     sacubitril-valsartan (ENTRESTO) 49-51 MG Take 1 tablet by mouth 2 (two) times daily. 60 tablet 6   spironolactone  (ALDACTONE) 25 MG tablet Take 0.5 tablets (12.5 mg total) by mouth  daily. 45 tablet 3   SUPER B COMPLEX/C PO Take 1 tablet by mouth daily.     No current facility-administered medications for this visit.    Family History  Adopted: Yes  Problem Relation Age of Onset   Heart disease Father    Heart attack Father 41   Hypertension Father    Hyperlipidemia Father    Diabetes Daughter    Hyperlipidemia Daughter    Hypertension Daughter    Diabetes Daughter    Hyperlipidemia Daughter    Hypertension Daughter    Kidney disease Mother     Social History   Socioeconomic History   Marital status: Married    Spouse name: Not on file   Number of children: Not on file   Years of education: Not on file   Highest education level: Not on file  Occupational History   Not on file  Tobacco Use   Smoking status: Never   Smokeless tobacco: Never  Vaping Use   Vaping Use: Never used  Substance and Sexual Activity   Alcohol use: No   Drug use: No   Sexual activity: Yes  Other Topics Concern   Not on file  Social History Narrative   Not on file   Social Determinants of Health   Financial Resource Strain: Not on file  Food Insecurity: Not on file  Transportation Needs: Not on file  Physical Activity: Not on file  Stress: Not on file  Social Connections: Not on file  Intimate Partner Violence: Not on file     REVIEW OF SYSTEMS:   [X]  denotes positive finding, [ ]  denotes negative finding Cardiac  Comments:  Chest pain or chest pressure:    Shortness of breath upon exertion:    Short of breath when lying flat:    Irregular heart rhythm:        Vascular    Pain in calf, thigh, or hip brought on by ambulation:    Pain in feet at night that wakes you up from your sleep:     Blood clot in your veins:    Leg swelling:         Pulmonary    Oxygen at home:    Productive cough:     Wheezing:         Neurologic    Sudden weakness in arms or legs:     Sudden numbness in  arms or legs:     Sudden onset of difficulty speaking or slurred speech:    Temporary loss of vision in one eye:     Problems with dizziness:         Gastrointestinal    Blood in stool:     Vomited blood:         Genitourinary    Burning when urinating:     Blood in urine:        Psychiatric    Major depression:         Hematologic    Bleeding problems:    Problems with blood clotting too easily:        Skin    Rashes or ulcers:        Constitutional    Fever or chills:      PHYSICAL EXAMINATION:  Today's Vitals   10/30/20 1036 10/30/20 1042  BP: (!) 173/77 (!) 167/84  Pulse: (!) 53 (!) 53  Resp: 16   SpO2: 100%   Weight: 133 lb (60.3 kg)   Height: 5\' 6"  (  1.676 m)    Body mass index is 21.47 kg/m.   General:  WDWN in NAD; vital signs documented above Gait: Not observed HENT: WNL, normocephalic Pulmonary: normal non-labored breathing Cardiac: regular HR, without carotid bruits Abdomen: soft, NT; aortic pulse is not palpable Skin: without rashes Vascular Exam/Pulses:  Right Left  Radial 2+ (normal) 2+ (normal)  DP Unable to palpate Unable to palpate  PT 2+ (normal) 2+ (normal)   Extremities: without ischemic changes, without Gangrene , without cellulitis; without open wounds Musculoskeletal: no muscle wasting or atrophy  Neurologic: A&O X 3; moving all extremities equally; speech is fluent/normal Psychiatric:  The pt has Normal affect.   Non-Invasive Vascular Imaging:   Carotid Duplex on 10/30/2020: Right:  1-39% ICA stenosis Left:  1-39% ICA stenosis Vertebrals:  Left vertebral artery demonstrates antegrade flow. Right  vertebral artery demonstrates bidirectional flow.  Subclavians: Right subclavian artery flow was disturbed. Normal flow hemodynamics were seen in the left subclavian artery.   Previous Carotid duplex on 10/30/2019: Right: 40-59% ICA stenosis Left:   1-39% ICA stenosis    ASSESSMENT/PLAN:: 80 y.o. male here for follow up carotid  artery stenosis and is s/p left CEA for asymptomatic carotid artery stenosis on 05/08/2013 by Dr. Darrick Penna.  -duplex today reveals 1-39% bilateral ICA stenosis.  -discussed s/s of stroke with pt and he understands should he develop any of these sx, he will go to the nearest ER or call 911. -pt will f/u in one year with carotid duplex -pt will call sooner should they have any issues. -continue statin/asa/plavix  Doreatha Massed, [facility] [phone]  Clinic MD:  Karin Lieu

## 2020-11-02 ENCOUNTER — Ambulatory Visit: Payer: Self-pay | Admitting: Surgery

## 2020-11-02 ENCOUNTER — Encounter (HOSPITAL_COMMUNITY): Payer: PPO

## 2020-11-02 NOTE — H&P (Signed)
   Chief Complaint: Follow-up (Left groin pain)  History of Present Illness:  Patient is referred by his primary care physician, Dr. Dana Collins, for surgical evaluation and management of newly diagnosed right inguinal hernia. Patient's cardiologist is Dr. Jonathan Berry. Patient has a history of left inguinal hernia repair performed approximately 20 years ago in [place], . This had an early recurrence and the patient was seen in our practice by Dr. Christian Streck who performed a repair on the left side using mesh. The patient has had no sign of recurrence since that time. Over the past several weeks the patient has noted a bulge in the right groin. This has become larger and more symptomatic. It is always been reducible. The patient has attempted wearing a truss without success. He has had no signs or symptoms of obstruction. It does cause discomfort. He presents today for evaluation and to discuss hernia repair.  Patient does have a significant cardiac history with congestive heart failure. He is recently started on Plavix.  Patient is still an active minister.   Review of Systems: A complete review of systems was obtained from the patient. I have reviewed this information and discussed as appropriate with the patient. See HPI as well for other ROS.  Review of Systems  Constitutional: Positive for malaise/fatigue.  HENT: Negative.  Eyes: Negative.  Vision loss  Respiratory: Negative.  Cardiovascular: Negative.  Gastrointestinal: Negative.  Genitourinary: Negative.  Musculoskeletal: Negative.  Groin pain  Skin: Negative.  Neurological: Negative.  Endo/Heme/Allergies: Negative.  Psychiatric/Behavioral: Negative.    Medical History: Past Medical History:  Diagnosis Date   Arthritis   Asthma, unspecified asthma severity, unspecified whether complicated, unspecified whether persistent   CHF (congestive heart failure) (CMS-HCC)   Diabetes mellitus without  complication (CMS-HCC)   Heart valve disease   Hyperlipidemia   Patient Active Problem List  Diagnosis   Occlusion and stenosis of unspecified carotid artery   CHF (congestive heart failure) (CMS-HCC)   Essential hypertension   Right inguinal hernia   Past Surgical History:  Procedure Laterality Date   HEMORRHOID SURGERY   HERNIA REPAIR    Allergies  Allergen Reactions   Albuterol Anaphylaxis  Per patient and daughter   Meloxicam Itching, Other (See Comments) and Palpitations  Headaches, Mood swings.   Statins-Hmg-Coa Reductase Inhibitors Other (See Comments)  Myalgias. Severe arthritic response. Tolerates Lovastatin.   Nsaids (Non-Steroidal Anti-Inflammatory Drug) Other (See Comments)  Aleve , Myalgia   Cortisone Other (See Comments)  Patient states cortisone injections he has received for his knees cause hypertension and hyperglycemia   Doxycycline Unknown  Other reaction(s): Coughing   Empagliflozin Unknown  hallucinations   Glimepiride Itching   Hydrochlorothiazide Unknown  Other reaction(s): Low Blood Pressure   Indomethacin Unknown  Neck pain flare   Penicillins Unknown  *positive allergy test*   Propoxyphene Other (See Comments)  hallucinate   Sulfa (Sulfonamide Antibiotics) Nausea And Vomiting   Terbinafine Diarrhea  Other reaction(s): diarrhea   Tramadol Other (See Comments)  Hallucinations   Current Outpatient Medications on File Prior to Visit  Medication Sig Dispense Refill   carvediloL (COREG) 6.25 MG tablet Take 1 tablet each morning and 2 tablets each night.   metFORMIN (GLUCOPHAGE-XR) 500 MG XR tablet Take by mouth   pantoprazole (PROTONIX) 40 MG DR tablet Take 40 mg by mouth once daily   spironolactone (ALDACTONE) 25 MG tablet Take by mouth   acetaminophen (TYLENOL) 650 MG ER tablet Take by mouth   aspirin   81 MG EC tablet Take by mouth   biotin 1,000 mcg Chew Take by mouth   cetirizine (ZYRTEC) 10 MG tablet Take by mouth   doxazosin (CARDURA)  4 MG tablet Take by mouth   ENTRESTO 49-51 mg tablet Take 1 tablet by mouth 2 (two) times daily   JARDIANCE 10 mg tablet TAKE 1 TABLET BY MOUTH DAILY BEFORE BREAKFAST.   losartan (COZAAR) 100 MG tablet Take 100 mg by mouth once daily   lovastatin (MEVACOR) 20 MG tablet Take by mouth   polyvinyl alcohol (LIQUITEARS) 1.4 % ophthalmic solution Apply to eye   No current facility-administered medications on file prior to visit.   Family History  Problem Relation Age of Onset   High blood pressure (Hypertension) Father   Hyperlipidemia (Elevated cholesterol) Father   Heart valve disease Father    Social History   Tobacco Use  Smoking Status Never Smoker  Smokeless Tobacco Never Used    Social History   Socioeconomic History   Marital status: Married  Tobacco Use   Smoking status: Never Smoker   Smokeless tobacco: Never Used  Vaping Use   Vaping Use: Never used  Substance and Sexual Activity   Alcohol use: Not Currently   Drug use: Never   Objective:   Vitals:  BP: (!) 142/80  Pulse: 62  Temp: 37.1 C (98.7 F)  SpO2: 100%  Weight: 61.2 kg (135 lb)  Height: 167.6 cm (5' 6")   Body mass index is 21.79 kg/m.  Physical Exam   GENERAL APPEARANCE Development: normal Nutritional status: normal Gross deformities: none  SKIN Rash, lesions, ulcers: none Induration, erythema: none Nodules: none palpable  EYES Conjunctiva and lids: normal Pupils: equal and reactive Iris: normal bilaterally Opacification of cornea on left  EARS, NOSE, MOUTH, THROAT External ears: no lesion or deformity External nose: no lesion or deformity Hearing: grossly normal Due to Covid-19 pandemic, patient is wearing a mask.  NECK Symmetric: yes Trachea: midline Thyroid: no palpable nodules in the thyroid bed  CHEST Respiratory effort: normal Retraction or accessory muscle use: no Breath sounds: normal bilaterally Rales, rhonchi, wheeze: none  CARDIOVASCULAR Auscultation:  regular rhythm, normal rate Murmurs: none Pulses: radial pulse 2+ palpable Lower extremity edema: none  ABDOMEN Distension: none Masses: none palpable Tenderness: none Hepatosplenomegaly: not present Hernia: not present  GENITOURINARY Penis: no lesions Scrotum: no masses There is a moderately large visible bulge in the right groin. On palpation there is a moderate right inguinal hernia which is soft and reducible. It augments with cough and Valsalva. Palpation in the left inguinal canal shows no sign of recurrent hernia.  MUSCULOSKELETAL Station and gait: normal Digits and nails: no clubbing or cyanosis Muscle strength: grossly normal all extremities Range of motion: grossly normal all extremities Deformity: none  LYMPHATIC Cervical: none palpable Supraclavicular: none palpable  PSYCHIATRIC Oriented to person, place, and time: yes Mood and affect: normal for situation Judgment and insight: appropriate for situation  Assessment and Plan:   Right inguinal hernia   Patient presents today on referral from his primary care physician for surgical management of a symptomatic right inguinal hernia. Patient is provided with written literature on hernia surgery to review at home.  Patient has a moderately large right inguinal hernia which is mildly to moderately symptomatic. I think he is at significant risk of incarceration or obstruction due to this hernia. We discussed the risk of surgery given his new diagnosis of congestive heart failure. We will involve his cardiologist in the   decision-making process and plan to do his procedure at [facility], hopefully still as an outpatient surgery. We discussed operative repair using prosthetic mesh. We discussed restrictions on his activities after surgery. We discussed the risk of recurrence. The patient understands and wishes to proceed with surgery in the near future.  Patient will require cardiac clearance. We will request that  from his cardiologist. He will need to stop Plavix 5 days prior to surgery.  The risks and benefits of the procedure have been discussed at length with the patient. The patient understands the proposed procedure, potential alternative treatments, and the course of recovery to be expected. All of the patient's questions have been answered at this time. The patient wishes to proceed with surgery.   Darnell Level, MD [facility] A [facility] practice Office: [phone]

## 2020-11-02 NOTE — Progress Notes (Signed)
Surgical Instructions    Your procedure is scheduled on Monday, October 3rd.  Report to [facility] Main Entrance "A" at 9:30 A.M., then check in with the Admitting office.  Call this number if you have problems the morning of surgery:  [phone]   If you have any questions prior to your surgery date call [phone]: Open Monday-Friday 8am-4pm    Remember:  Do not eat after midnight the night before your surgery  You may drink clear liquids until 8:30 AM the morning of your surgery.   Clear liquids allowed are: Water, Non-Citrus Juices (without pulp), Carbonated Beverages, Clear Tea, Black Coffee ONLY (NO MILK, CREAM OR POWDERED CREAMER of any kind), and Gatorade    Take these medicines the morning of surgery with A SIP OF WATER Carvedilol (Coreg) Pantoprazole (Protonix)  If needed: Tylenol   Eye drops  Follow your surgeon's instructions on when to stop Aspirin & Plavix.  Per Dr. Allyson Sabal, ok to hold Plavix.  If no instructions were given by your surgeon then you will need to call the office to get those instructions.    As of today, STOP taking Aleve, Naproxen, Ibuprofen, Motrin, Advil, Goody's, BC's, all herbal medications, fish oil, and all vitamins.  WHAT DO I DO ABOUT MY DIABETES MEDICATION?   Do not take oral diabetes medicines (pills) the morning of surgery. - Metformin   HOW TO MANAGE YOUR DIABETES BEFORE AND AFTER SURGERY  Why is it important to control my blood sugar before and after surgery? Improving blood sugar levels before and after surgery helps healing and can limit problems. A way of improving blood sugar control is eating a healthy diet by:  Eating less sugar and carbohydrates  Increasing activity/exercise  Talking with your doctor about reaching your blood sugar goals High blood sugars (greater than 180 mg/dL) can raise your risk of infections and slow your recovery, so you will need to focus on controlling your diabetes during the weeks before  surgery. Make sure that the doctor who takes care of your diabetes knows about your planned surgery including the date and location.  How do I manage my blood sugar before surgery? Check your blood sugar at least 4 times a day, starting 2 days before surgery, to make sure that the level is not too high or low.  Check your blood sugar the morning of your surgery when you wake up and every 2 hours until you get to the Short Stay unit.  If your blood sugar is less than 70 mg/dL, you will need to treat for low blood sugar: Do not take insulin. Treat a low blood sugar (less than 70 mg/dL) with  cup of clear juice (cranberry or apple), 4 glucose tablets, OR glucose gel. Recheck blood sugar in 15 minutes after treatment (to make sure it is greater than 70 mg/dL). If your blood sugar is not greater than 70 mg/dL on recheck, call [phone] for further instructions. Report your blood sugar to the short stay nurse when you get to Short Stay.  If you are admitted to the hospital after surgery: Your blood sugar will be checked by the staff and you will probably be given insulin after surgery (instead of oral diabetes medicines) to make sure you have good blood sugar levels. The goal for blood sugar control after surgery is 80-180 mg/dL.           DAY OF SURGERY: Do not wear jewelry  Do not wear lotions, powders, colognes, or deodorant. Men  may shave face and neck. Do not bring valuables to the hospital.             [facility] is not responsible for any belongings or valuables.  Do NOT Smoke (Tobacco/Vaping)  24 hours prior to your procedure If you use a CPAP at night, you may bring your mask for your overnight stay.   Contacts, glasses, dentures or bridgework may not be worn into surgery, please bring cases for these belongings   For patients admitted to the hospital, discharge time will be determined by your treatment team.   Patients discharged the day of surgery will not be allowed to  drive home, and someone needs to stay with them for 24 hours.  NO VISITORS WILL BE ALLOWED IN PRE-OP WHERE PATIENTS GET READY FOR SURGERY.  ONLY 1 SUPPORT PERSON MAY BE PRESENT WHILE YOU ARE IN SURGERY.  IF YOU ARE TO BE ADMITTED, ONCE YOU ARE IN YOUR ROOM YOU WILL BE ALLOWED TWO (2) VISITORS.  Minor children may have two parents present. Special consideration for safety and communication needs will be reviewed on a case by case basis.  Special instructions:    Oral Hygiene is also important to reduce your risk of infection.  Remember - BRUSH YOUR TEETH THE MORNING OF SURGERY WITH YOUR REGULAR TOOTHPASTE   [place]- Preparing For Surgery  Before surgery, you can play an important role. Because skin is not sterile, your skin needs to be as free of germs as possible. You can reduce the number of germs on your skin by washing with CHG (chlorahexidine gluconate) Soap before surgery.  CHG is an antiseptic cleaner which kills germs and bonds with the skin to continue killing germs even after washing.     Please do not use if you have an allergy to CHG or antibacterial soaps. If your skin becomes reddened/irritated stop using the CHG.  Do not shave (including legs and underarms) for at least 48 hours prior to first CHG shower. It is OK to shave your face.  Please follow these instructions carefully.     Shower the NIGHT BEFORE SURGERY and the MORNING OF SURGERY with CHG Soap.   If you chose to wash your hair, wash your hair first as usual with your normal shampoo. After you shampoo, rinse your hair and body thoroughly to remove the shampoo.  Then [employer] and genitals (private parts) with your normal soap and rinse thoroughly to remove soap.  After that Use CHG Soap as you would any other liquid soap. You can apply CHG directly to the skin and wash gently with a scrungie or a clean washcloth.   Apply the CHG Soap to your body ONLY FROM THE NECK DOWN.  Do not use on open wounds or open sores.  Avoid contact with your eyes, ears, mouth and genitals (private parts). Wash Face and genitals (private parts)  with your normal soap.   Wash thoroughly, paying special attention to the area where your surgery will be performed.  Thoroughly rinse your body with warm water from the neck down.  DO NOT shower/wash with your normal soap after using and rinsing off the CHG Soap.  Pat yourself dry with a CLEAN TOWEL.  Wear CLEAN PAJAMAS to bed the night before surgery  Place CLEAN SHEETS on your bed the night before your surgery  DO NOT SLEEP WITH PETS.   Day of Surgery:  Take a shower with CHG soap. Wear Clean/Comfortable clothing the morning of surgery  Do not apply any deodorants/lotions.   Remember to brush your teeth WITH YOUR REGULAR TOOTHPASTE.   Please read over the following fact sheets that you were given.

## 2020-11-02 NOTE — H&P (View-Only) (Signed)
Chief Complaint: Follow-up (Left groin pain)  History of Present Illness:  Patient is referred by his primary care physician, Dr. Irena Reichmann, for surgical evaluation and management of newly diagnosed right inguinal hernia. Patient's cardiologist is Dr. Nanetta Batty. Patient has a history of left inguinal hernia repair performed approximately 20 years ago in [place], [place]. This had an early recurrence and the patient was seen in our practice by Dr. Cyndia Bent who performed a repair on the left side using mesh. The patient has had no sign of recurrence since that time. Over the past several weeks the patient has noted a bulge in the right groin. This has become larger and more symptomatic. It is always been reducible. The patient has attempted wearing a truss without success. He has had no signs or symptoms of obstruction. It does cause discomfort. He presents today for evaluation and to discuss hernia repair.  Patient does have a significant cardiac history with congestive heart failure. He is recently started on Plavix.  Patient is still an Doctor, general practice.   Review of Systems: A complete review of systems was obtained from the patient. I have reviewed this information and discussed as appropriate with the patient. See HPI as well for other ROS.  Review of Systems  Constitutional: Positive for malaise/fatigue.  HENT: Negative.  Eyes: Negative.  Vision loss  Respiratory: Negative.  Cardiovascular: Negative.  Gastrointestinal: Negative.  Genitourinary: Negative.  Musculoskeletal: Negative.  Groin pain  Skin: Negative.  Neurological: Negative.  Endo/Heme/Allergies: Negative.  Psychiatric/Behavioral: Negative.    Medical History: Past Medical History:  Diagnosis Date   Arthritis   Asthma, unspecified asthma severity, unspecified whether complicated, unspecified whether persistent   CHF (congestive heart failure) (CMS-HCC)   Diabetes mellitus without  complication (CMS-HCC)   Heart valve disease   Hyperlipidemia   Patient Active Problem List  Diagnosis   Occlusion and stenosis of unspecified carotid artery   CHF (congestive heart failure) (CMS-HCC)   Essential hypertension   Right inguinal hernia   Past Surgical History:  Procedure Laterality Date   HEMORRHOID SURGERY   HERNIA REPAIR    Allergies  Allergen Reactions   Albuterol Anaphylaxis  Per patient and daughter   Meloxicam Itching, Other (See Comments) and Palpitations  Headaches, Mood swings.   Statins-Hmg-Coa Reductase Inhibitors Other (See Comments)  Myalgias. Severe arthritic response. Tolerates Lovastatin.   Nsaids (Non-Steroidal Anti-Inflammatory Drug) Other (See Comments)  Aleve , Myalgia   Cortisone Other (See Comments)  Patient states cortisone injections he has received for his knees cause hypertension and hyperglycemia   Doxycycline Unknown  Other reaction(s): Coughing   Empagliflozin Unknown  hallucinations   Glimepiride Itching   Hydrochlorothiazide Unknown  Other reaction(s): Low Blood Pressure   Indomethacin Unknown  Neck pain flare   Penicillins Unknown  *positive allergy test*   Propoxyphene Other (See Comments)  hallucinate   Sulfa (Sulfonamide Antibiotics) Nausea And Vomiting   Terbinafine Diarrhea  Other reaction(s): diarrhea   Tramadol Other (See Comments)  Hallucinations   Current Outpatient Medications on File Prior to Visit  Medication Sig Dispense Refill   carvediloL (COREG) 6.25 MG tablet Take 1 tablet each morning and 2 tablets each night.   metFORMIN (GLUCOPHAGE-XR) 500 MG XR tablet Take by mouth   pantoprazole (PROTONIX) 40 MG DR tablet Take 40 mg by mouth once daily   spironolactone (ALDACTONE) 25 MG tablet Take by mouth   acetaminophen (TYLENOL) 650 MG ER tablet Take by mouth   aspirin  81 MG EC tablet Take by mouth   biotin 1,000 mcg Chew Take by mouth   cetirizine (ZYRTEC) 10 MG tablet Take by mouth   doxazosin (CARDURA)  4 MG tablet Take by mouth   ENTRESTO 49-51 mg tablet Take 1 tablet by mouth 2 (two) times daily   JARDIANCE 10 mg tablet TAKE 1 TABLET BY MOUTH DAILY BEFORE BREAKFAST.   losartan (COZAAR) 100 MG tablet Take 100 mg by mouth once daily   lovastatin (MEVACOR) 20 MG tablet Take by mouth   polyvinyl alcohol (LIQUITEARS) 1.4 % ophthalmic solution Apply to eye   No current facility-administered medications on file prior to visit.   Family History  Problem Relation Age of Onset   High blood pressure (Hypertension) Father   Hyperlipidemia (Elevated cholesterol) Father   Heart valve disease Father    Social History   Tobacco Use  Smoking Status Never Smoker  Smokeless Tobacco Never Used    Social History   Socioeconomic History   Marital status: Married  Tobacco Use   Smoking status: Never Smoker   Smokeless tobacco: Never Used  Building services engineer Use: Never used  Substance and Sexual Activity   Alcohol use: Not Currently   Drug use: Never   Objective:   Vitals:  BP: (!) 142/80  Pulse: 62  Temp: 37.1 C (98.7 F)  SpO2: 100%  Weight: 61.2 kg (135 lb)  Height: 167.6 cm (5\' 6" )   Body mass index is 21.79 kg/m.  Physical Exam   GENERAL APPEARANCE Development: normal Nutritional status: normal Gross deformities: none  SKIN Rash, lesions, ulcers: none Induration, erythema: none Nodules: none palpable  EYES Conjunctiva and lids: normal Pupils: equal and reactive Iris: normal bilaterally Opacification of cornea on left  EARS, NOSE, MOUTH, THROAT External ears: no lesion or deformity External nose: no lesion or deformity Hearing: grossly normal Due to Covid-19 pandemic, patient is wearing a mask.  NECK Symmetric: yes Trachea: midline Thyroid: no palpable nodules in the thyroid bed  CHEST Respiratory effort: normal Retraction or accessory muscle use: no Breath sounds: normal bilaterally Rales, rhonchi, wheeze: none  CARDIOVASCULAR Auscultation:  regular rhythm, normal rate Murmurs: none Pulses: radial pulse 2+ palpable Lower extremity edema: none  ABDOMEN Distension: none Masses: none palpable Tenderness: none Hepatosplenomegaly: not present Hernia: not present  GENITOURINARY Penis: no lesions Scrotum: no masses There is a moderately large visible bulge in the right groin. On palpation there is a moderate right inguinal hernia which is soft and reducible. It augments with cough and Valsalva. Palpation in the left inguinal canal shows no sign of recurrent hernia.  MUSCULOSKELETAL Station and gait: normal Digits and nails: no clubbing or cyanosis Muscle strength: grossly normal all extremities Range of motion: grossly normal all extremities Deformity: none  LYMPHATIC Cervical: none palpable Supraclavicular: none palpable  PSYCHIATRIC Oriented to person, place, and time: yes Mood and affect: normal for situation Judgment and insight: appropriate for situation  Assessment and Plan:   Right inguinal hernia   Patient presents today on referral from his primary care physician for surgical management of a symptomatic right inguinal hernia. Patient is provided with written literature on hernia surgery to review at home.  Patient has a moderately large right inguinal hernia which is mildly to moderately symptomatic. I think he is at significant risk of incarceration or obstruction due to this hernia. We discussed the risk of surgery given his new diagnosis of congestive heart failure. We will involve his cardiologist in the  decision-making process and plan to do his procedure at [facility], hopefully still as an outpatient surgery. We discussed operative repair using prosthetic mesh. We discussed restrictions on his activities after surgery. We discussed the risk of recurrence. The patient understands and wishes to proceed with surgery in the near future.  Patient will require cardiac clearance. We will request that  from his cardiologist. He will need to stop Plavix 5 days prior to surgery.  The risks and benefits of the procedure have been discussed at length with the patient. The patient understands the proposed procedure, potential alternative treatments, and the course of recovery to be expected. All of the patient's questions have been answered at this time. The patient wishes to proceed with surgery.   Darnell Level, MD [facility] A [facility] practice Office: [phone]

## 2020-11-03 ENCOUNTER — Encounter (HOSPITAL_COMMUNITY): Payer: HMO

## 2020-11-03 ENCOUNTER — Encounter (HOSPITAL_COMMUNITY): Payer: Self-pay

## 2020-11-03 ENCOUNTER — Other Ambulatory Visit: Payer: Self-pay

## 2020-11-03 ENCOUNTER — Encounter (HOSPITAL_COMMUNITY)
Admission: RE | Admit: 2020-11-03 | Discharge: 2020-11-03 | Disposition: A | Discharge status: Home / Self Care | Payer: HMO | Source: Ambulatory Visit | Attending: Surgery | Admitting: Surgery

## 2020-11-03 DIAGNOSIS — Z01812 Encounter for preprocedural laboratory examination: Secondary | ICD-10-CM | POA: Diagnosis not present

## 2020-11-03 LAB — BASIC METABOLIC PANEL
Anion gap: 6 (ref 5–15)
BUN: 19 mg/dL (ref 8–23)
CO2: 26 mmol/L (ref 22–32)
Calcium: 10 mg/dL (ref 8.9–10.3)
Chloride: 106 mmol/L (ref 98–111)
Creatinine, Ser: 0.9 mg/dL (ref 0.61–1.24)
GFR, Estimated: >60 mL/min (ref >60)
Glucose, Bld: 107 mg/dL — ABNORMAL HIGH (ref 70–99)
Potassium: 4.4 mmol/L (ref 3.5–5.1)
Sodium: 138 mmol/L (ref 135–145)

## 2020-11-03 LAB — CBC
HCT: 40.5 % (ref 39.0–52.0)
Hemoglobin: 14 g/dL (ref 13.0–17.0)
MCH: 33.7 pg (ref 26.0–34.0)
MCHC: 34.6 g/dL (ref 30.0–36.0)
MCV: 97.6 fL (ref 80.0–100.0)
Platelets: 190 10*3/uL (ref 150–400)
RBC: 4.15 MIL/uL — ABNORMAL LOW (ref 4.22–5.81)
RDW: 13.3 % (ref 11.5–15.5)
WBC: 6.2 10*3/uL (ref 4.0–10.5)
nRBC: 0 % (ref 0.0–0.2)

## 2020-11-03 LAB — GLUCOSE, CAPILLARY: Glucose-Capillary: 105 mg/dL — ABNORMAL HIGH (ref 70–99)

## 2020-11-03 NOTE — Progress Notes (Signed)
PCP - Irena Reichmann Cardiologist - Dr. Allyson Sabal See note from Micah Flesher, [place] on 10/15/20  Chest x-ray - n/a EKG - 09/21/20 ECHO - 06/24/20 Cardiac Cath - 09/08/20  DM - Type 2 Fasting Blood Sugar - 160's (patient stated he doesn't typically check fasting sugars)   Blood Thinner Instructions: last dose of Plavix & ASA is 11/03/20, per patient   ERAS Protcol - yes, no drink ordered or given   COVID TEST- n/a (ambulatory sx)   Anesthesia review: yes, heart history High-risk surgical candidate - per Dr. Allyson Sabal (see note 10/15/20 from Micah Flesher, [place])  Patient denies shortness of breath, fever, cough and chest pain at PAT appointment   All instructions explained to the patient, with a verbal understanding of the material. Patient agrees to go over the instructions while at home for a better understanding. Patient also instructed to self quarantine after being tested for COVID-19. The opportunity to ask questions was provided.

## 2020-11-05 NOTE — Telephone Encounter (Signed)
Return call: Called patient back and and he states he does not think another test is necessary.

## 2020-11-05 NOTE — Progress Notes (Signed)
Anesthesia Chart Review:  Follows with vascular surgery for hx oc CEA for asymptomatic stenosis. Last seen 10/30/20. Per note, "80 y.o. male here for follow up carotid artery stenosis and is s/p left CEA for asymptomatic carotid artery stenosis on 05/08/2013 by Dr. Darrick Penna. -duplex today reveals 1-39% bilateral ICA stenosis. -discussed s/s of stroke with pt and he understands should he develop any of these sx, he will go to the nearest ER or call 911. -pt will f/u in one year with carotid duplex -pt will call sooner should they have any issues.-continue statin/asa/plavix."  Pt has recently diagnosed left main/3 vessel CAD and HFrEF. He was felt to be high risk candidate for CABG and ultimately declined surgical or percutaneous intervention. He has been asymptomatic, reports no anginal pain with activity. Clydene Laming, RN spoke with the patient to clarify baseline functional status. The patient reported that, "He is able to function normally. No CP or SOB on any activity including climbing stairs. He stated that he never had any cardiac symptoms so he was unsure of when the heart attack occurred. He still drives occasionally and can do everything for himself."  Cardiology commented on operative risk per telephone encounter by Micah Flesher, [name] 10/15/20, " Chart reviewed as part of pre-operative protocol coverage. Patient was contacted 10/15/2020 in reference to pre-operative risk assessment for pending surgery as outlined below.  SEWELL PITNER was last seen on 10/07/20 by Dr. Allyson Sabal. He had a recent heart cath that revealed 80% left main stenosis as well as three vessel disease. Pt was evaluated by CT surgery but ultimately turned down CABG. Because of his high risk disease, I reached out to Dr. Allyson Sabal for further guidance.   Per Dr. Allyson Sabal:  High risk surgical candidate.  I prefer that surgery not be done under general anesthesia but rather regional block.  The patient has not been instrumented, does not have a stent.   Okay to hold Plavix.  He has left main/three-vessel disease."  I reviewed the case with anesthesiologist Dr. Hart Rochester. He advised that he would have a conversation with Dr. Gerrit Friends regarding surgical risk and that ultimately a day of surgery assessment and conversation with the pt would have to be had to ensure the risks are fully understood.  Preop labs reviewed, unremarkable.  Cardiac MRI 09/09/20: IMPRESSION: 1. Normal left ventricular size with wall motion abnormalities as noted above. LV EF 25-30% (images not ideal for quantification by Simpson's rule).   2.  Normal RV size and systolic function.   3. There is significant myocardial viability. There is non-transmural scar only in the basal to mid inferolateral wall. May see significant LV functional improvement with full revascularization.  Cath 09/08/20:   Prox RCA lesion is 80% stenosed.   Mid RCA lesion is 95% stenosed.   Ost Cx lesion is 100% stenosed.   Ost LM lesion is 80% stenosed.   Mid LAD lesion is 60% stenosed.   1st Diag lesion is 95% stenosed.   Dist LAD lesion is 70% stenosed.   1. Low filling pressures.  2. Preserved cardiac output.  3. Severe coronary disease with critical mid RCA stenosis, occluded ostial LCx with collaterals from PDA, and 80% left main stenosis with moderate LAD disease.    TTE 06/24/20:  1. Left ventricular ejection fraction, by estimation, is 20 to 25%. The  left ventricle has severely decreased function. The left ventricle  demonstrates regional wall motion abnormalities (see scoring  diagram/findings for description). Left ventricular  diastolic  parameters are consistent with Grade III diastolic dysfunction  (restrictive).   2. Right ventricular systolic function is normal. The right ventricular  size is normal. There is severely elevated pulmonary artery systolic  pressure. The estimated right ventricular systolic pressure is 85.8 mmHg.   3. Left atrial size was moderately dilated.    4. Right atrial size was mildly dilated.   5. The mitral valve is grossly normal. Mild to moderate mitral valve  regurgitation. No evidence of mitral stenosis.   6. Tricuspid valve regurgitation is mild to moderate.   7. The aortic valve is grossly normal. There is mild calcification of the  aortic valve. Aortic valve regurgitation is trivial. No aortic stenosis is  present.   8. Aortic dilatation noted. There is mild dilatation of the aortic root,  measuring 42 mm.   9. The inferior vena cava is normal in size with greater than 50%  respiratory variability, suggesting right atrial pressure of 3 mmHg.

## 2020-11-06 NOTE — Anesthesia Preprocedure Evaluation (Addendum)
Anesthesia Evaluation  Patient identified by MRN, date of birth, ID band Patient awake    Reviewed: Allergy & Precautions, NPO status , Patient's Chart, lab work & pertinent test results, reviewed documented beta blocker date and time   History of Anesthesia Complications Negative for: history of anesthetic complications  Airway Mallampati: II  TM Distance: >3 FB Neck ROM: Full    Dental no notable dental hx.    Pulmonary asthma ,    Pulmonary exam normal        Cardiovascular hypertension, Pt. on home beta blockers and Pt. on medications + CAD and +CHF  Normal cardiovascular exam  s/p left CEA 2015   TTE 06/24/20: EF 20-25%, RWMAs, grade III DD, severely elevated PASP 85.62mmHg, moderate LAE, mild RAE, mild to moderate MR, mild to moderate TR, mild dilatation of the aortic root measuring 39mm    Neuro/Psych negative neurological ROS     GI/Hepatic Neg liver ROS, GERD  Controlled,  Endo/Other  diabetes, Type 2  Renal/GU negative Renal ROS  negative genitourinary   Musculoskeletal  (+) Arthritis ,   Abdominal   Peds  Hematology negative hematology ROS (+)   Anesthesia Other Findings Day of surgery medications reviewed with patient.  Reproductive/Obstetrics negative OB ROS                           Anesthesia Physical Anesthesia Plan  ASA: 4  Anesthesia Plan: General   Post-op Pain Management:    Induction: Intravenous  PONV Risk Score and Plan: 3 and Treatment may vary due to age or medical condition, Ondansetron and Dexamethasone  Airway Management Planned: Oral ETT  Additional Equipment: Arterial line  Intra-op Plan:   Post-operative Plan: Extubation in OR  Informed Consent: I have reviewed the patients History and Physical, chart, labs and discussed the procedure including the risks, benefits and alternatives for the proposed anesthesia with the patient or authorized  representative who has indicated his/her understanding and acceptance.     Dental advisory given  Plan Discussed with: CRNA  Anesthesia Plan Comments: (See PAT note by Antionette Poles, [name] )       Anesthesia Quick Evaluation

## 2020-11-08 ENCOUNTER — Encounter (HOSPITAL_COMMUNITY): Payer: Self-pay | Admitting: Surgery

## 2020-11-08 DIAGNOSIS — K4090 Unilateral inguinal hernia, without obstruction or gangrene, not specified as recurrent: Secondary | ICD-10-CM

## 2020-11-09 ENCOUNTER — Encounter (HOSPITAL_COMMUNITY)
Admission: RE | Disposition: A | Discharge status: Home / Self Care | Payer: Self-pay | Source: Home / Self Care | Attending: Surgery

## 2020-11-09 ENCOUNTER — Ambulatory Visit (HOSPITAL_COMMUNITY): Payer: HMO | Admitting: Physician Assistant

## 2020-11-09 ENCOUNTER — Ambulatory Visit (HOSPITAL_COMMUNITY)
Admission: RE | Admit: 2020-11-09 | Discharge: 2020-11-09 | Disposition: A | Discharge status: Home / Self Care | Payer: HMO | Attending: Surgery | Admitting: Surgery

## 2020-11-09 ENCOUNTER — Encounter (HOSPITAL_COMMUNITY): Payer: Self-pay | Admitting: Surgery

## 2020-11-09 DIAGNOSIS — Z7984 Long term (current) use of oral hypoglycemic drugs: Secondary | ICD-10-CM | POA: Diagnosis not present

## 2020-11-09 DIAGNOSIS — I509 Heart failure, unspecified: Secondary | ICD-10-CM | POA: Diagnosis not present

## 2020-11-09 DIAGNOSIS — I110 Hypertensive heart disease with heart failure: Secondary | ICD-10-CM | POA: Insufficient documentation

## 2020-11-09 DIAGNOSIS — E785 Hyperlipidemia, unspecified: Secondary | ICD-10-CM | POA: Diagnosis not present

## 2020-11-09 DIAGNOSIS — Z79899 Other long term (current) drug therapy: Secondary | ICD-10-CM | POA: Diagnosis not present

## 2020-11-09 DIAGNOSIS — E119 Type 2 diabetes mellitus without complications: Secondary | ICD-10-CM | POA: Diagnosis not present

## 2020-11-09 DIAGNOSIS — Z7982 Long term (current) use of aspirin: Secondary | ICD-10-CM | POA: Insufficient documentation

## 2020-11-09 DIAGNOSIS — K219 Gastro-esophageal reflux disease without esophagitis: Secondary | ICD-10-CM | POA: Diagnosis not present

## 2020-11-09 DIAGNOSIS — I5023 Acute on chronic systolic (congestive) heart failure: Secondary | ICD-10-CM | POA: Diagnosis not present

## 2020-11-09 DIAGNOSIS — J45909 Unspecified asthma, uncomplicated: Secondary | ICD-10-CM | POA: Diagnosis not present

## 2020-11-09 DIAGNOSIS — K4090 Unilateral inguinal hernia, without obstruction or gangrene, not specified as recurrent: Secondary | ICD-10-CM | POA: Insufficient documentation

## 2020-11-09 DIAGNOSIS — Z888 Allergy status to other drugs, medicaments and biological substances status: Secondary | ICD-10-CM | POA: Insufficient documentation

## 2020-11-09 HISTORY — PX: INSERTION OF MESH: SHX5868

## 2020-11-09 HISTORY — PX: INGUINAL HERNIA REPAIR: SHX194

## 2020-11-09 LAB — GLUCOSE, CAPILLARY
Glucose-Capillary: 188 mg/dL — ABNORMAL HIGH (ref 70–99)
Glucose-Capillary: 179 mg/dL — ABNORMAL HIGH (ref 70–99)

## 2020-11-09 SURGERY — REPAIR, HERNIA, INGUINAL, ADULT
Anesthesia: General | Site: Groin | Laterality: Right

## 2020-11-09 MED ORDER — EPHEDRINE 5 MG/ML INJ
INTRAVENOUS | Status: AC
  Filled 2020-11-09: qty 5

## 2020-11-09 MED ORDER — ORAL CARE MOUTH RINSE: 15.0000 mL | Freq: Once | OROMUCOSAL | Status: AC

## 2020-11-09 MED ORDER — LIDOCAINE 2% (20 MG/ML) 5 ML SYRINGE
INTRAMUSCULAR | Status: AC
  Filled 2020-11-09: qty 5

## 2020-11-09 MED ORDER — LIDOCAINE HCL (CARDIAC) PF 100 MG/5ML IV SOSY
PREFILLED_SYRINGE | INTRAVENOUS | Status: DC | PRN
  Administered 2020-11-09: 60 mg via INTRAVENOUS

## 2020-11-09 MED ORDER — BUPIVACAINE HCL 0.25 % IJ SOLN
INTRAMUSCULAR | Status: DC | PRN
  Administered 2020-11-09: 30 mL

## 2020-11-09 MED ORDER — OXYCODONE HCL 5 MG PO TABS: 5.0000 mg | ORAL_TABLET | Freq: Four times a day (QID) | ORAL | 0 refills | Status: DC | PRN

## 2020-11-09 MED ORDER — BUPIVACAINE LIPOSOME 1.3 % IJ SUSP: 20.0000 mL | Freq: Once | INTRAMUSCULAR | Status: DC

## 2020-11-09 MED ORDER — BUPIVACAINE HCL (PF) 0.25 % IJ SOLN
INTRAMUSCULAR | Status: AC
  Filled 2020-11-09: qty 30

## 2020-11-09 MED ORDER — LACTATED RINGERS IV SOLN: INTRAVENOUS | Status: DC

## 2020-11-09 MED ORDER — CHLORHEXIDINE GLUCONATE CLOTH 2 % EX PADS: 6.0000 | MEDICATED_PAD | Freq: Once | CUTANEOUS | Status: DC

## 2020-11-09 MED ORDER — DEXAMETHASONE SODIUM PHOSPHATE 4 MG/ML IJ SOLN
INTRAMUSCULAR | Status: DC | PRN
  Administered 2020-11-09: 5 mg via INTRAVENOUS

## 2020-11-09 MED ORDER — EPHEDRINE SULFATE 50 MG/ML IJ SOLN
INTRAMUSCULAR | Status: DC | PRN
  Administered 2020-11-09 (×3): 5 mg via INTRAVENOUS

## 2020-11-09 MED ORDER — ONDANSETRON HCL 4 MG/2ML IJ SOLN
INTRAMUSCULAR | Status: AC
  Filled 2020-11-09: qty 2

## 2020-11-09 MED ORDER — PROPOFOL 10 MG/ML IV BOLUS
INTRAVENOUS | Status: AC
  Filled 2020-11-09: qty 20

## 2020-11-09 MED ORDER — SUGAMMADEX SODIUM 200 MG/2ML IV SOLN
INTRAVENOUS | Status: DC | PRN
  Administered 2020-11-09: 150 mg via INTRAVENOUS

## 2020-11-09 MED ORDER — FENTANYL CITRATE (PF) 250 MCG/5ML IJ SOLN
INTRAMUSCULAR | Status: AC
  Filled 2020-11-09: qty 5

## 2020-11-09 MED ORDER — CHLORHEXIDINE GLUCONATE 0.12 % MT SOLN
OROMUCOSAL | Status: AC
  Administered 2020-11-09: 15 mL via OROMUCOSAL
  Filled 2020-11-09: qty 15

## 2020-11-09 MED ORDER — VANCOMYCIN HCL IN DEXTROSE 1-5 GM/200ML-% IV SOLN: 1000.0000 mg | INTRAVENOUS | Status: AC

## 2020-11-09 MED ORDER — ONDANSETRON HCL 4 MG/2ML IJ SOLN
INTRAMUSCULAR | Status: DC | PRN
Start: 2020-11-09 — End: 2020-11-09
  Administered 2020-11-09: 4 mg via INTRAVENOUS

## 2020-11-09 MED ORDER — ROCURONIUM BROMIDE 10 MG/ML (PF) SYRINGE
PREFILLED_SYRINGE | INTRAVENOUS | Status: DC | PRN
  Administered 2020-11-09: 40 mg via INTRAVENOUS

## 2020-11-09 MED ORDER — CHLORHEXIDINE GLUCONATE 0.12 % MT SOLN: 15.0000 mL | Freq: Once | OROMUCOSAL | Status: AC

## 2020-11-09 MED ORDER — DEXAMETHASONE SODIUM PHOSPHATE 10 MG/ML IJ SOLN
INTRAMUSCULAR | Status: AC
  Filled 2020-11-09: qty 1

## 2020-11-09 MED ORDER — VANCOMYCIN HCL IN DEXTROSE 1-5 GM/200ML-% IV SOLN
INTRAVENOUS | Status: AC
  Administered 2020-11-09: 1000 mg via INTRAVENOUS
  Filled 2020-11-09: qty 200

## 2020-11-09 MED ORDER — PROPOFOL 10 MG/ML IV BOLUS
INTRAVENOUS | Status: DC | PRN
  Administered 2020-11-09: 10 mg via INTRAVENOUS
  Administered 2020-11-09: 50 mg via INTRAVENOUS
  Administered 2020-11-09: 20 mg via INTRAVENOUS

## 2020-11-09 MED ORDER — FENTANYL CITRATE (PF) 100 MCG/2ML IJ SOLN
INTRAMUSCULAR | Status: DC | PRN
  Administered 2020-11-09 (×2): 25 ug via INTRAVENOUS
  Administered 2020-11-09: 100 ug via INTRAVENOUS

## 2020-11-09 MED ORDER — ROCURONIUM BROMIDE 10 MG/ML (PF) SYRINGE
PREFILLED_SYRINGE | INTRAVENOUS | Status: AC
  Filled 2020-11-09: qty 10

## 2020-11-09 MED ORDER — ETOMIDATE 2 MG/ML IV SOLN
INTRAVENOUS | Status: DC | PRN
  Administered 2020-11-09: 10 mg via INTRAVENOUS

## 2020-11-09 MED ORDER — 0.9 % SODIUM CHLORIDE (POUR BTL) OPTIME
TOPICAL | Status: DC | PRN
  Administered 2020-11-09: 1000 mL

## 2020-11-09 SURGICAL SUPPLY — 47 items
ADH SKN CLS APL DERMABOND .7 (GAUZE/BANDAGES/DRESSINGS) ×1
ADH SKN CLS LQ APL DERMABOND (GAUZE/BANDAGES/DRESSINGS) ×1
APL PRP STRL LF DISP 70% ISPRP (MISCELLANEOUS) ×1
BAG COUNTER SPONGE SURGICOUNT (BAG) ×2 IMPLANT
BAG SPNG CNTER NS LX DISP (BAG) ×1
BLADE CLIPPER SURG (BLADE) IMPLANT
BLADE SURG 10 STRL SS (BLADE) ×2 IMPLANT
BLADE SURG 15 STRL LF DISP TIS (BLADE) ×1 IMPLANT
BLADE SURG 15 STRL SS (BLADE) ×2
CANISTER SUCT 3000ML PPV (MISCELLANEOUS) IMPLANT
CHLORAPREP W/TINT 26 (MISCELLANEOUS) ×2 IMPLANT
COVER SURGICAL LIGHT HANDLE (MISCELLANEOUS) ×2 IMPLANT
DERMABOND ADHESIVE PROPEN (GAUZE/BANDAGES/DRESSINGS) ×1
DERMABOND ADVANCED (GAUZE/BANDAGES/DRESSINGS) ×1
DERMABOND ADVANCED .7 DNX12 (GAUZE/BANDAGES/DRESSINGS) IMPLANT
DERMABOND ADVANCED .7 DNX6 (GAUZE/BANDAGES/DRESSINGS) ×1 IMPLANT
DRAPE LAPAROTOMY TRNSV 102X78 (DRAPES) ×2 IMPLANT
DRAPE UTILITY XL STRL (DRAPES) ×2 IMPLANT
ELECT CAUTERY BLADE 6.4 (BLADE) ×2 IMPLANT
ELECT REM PT RETURN 9FT ADLT (ELECTROSURGICAL) ×2
ELECTRODE REM PT RTRN 9FT ADLT (ELECTROSURGICAL) ×1 IMPLANT
GLOVE SURG ORTHO LTX SZ8 (GLOVE) ×2 IMPLANT
GOWN STRL REUS W/ TWL LRG LVL3 (GOWN DISPOSABLE) ×1 IMPLANT
GOWN STRL REUS W/ TWL XL LVL3 (GOWN DISPOSABLE) ×1 IMPLANT
GOWN STRL REUS W/TWL LRG LVL3 (GOWN DISPOSABLE) ×2
GOWN STRL REUS W/TWL XL LVL3 (GOWN DISPOSABLE) ×2
KIT BASIN OR (CUSTOM PROCEDURE TRAY) ×2 IMPLANT
KIT TURNOVER KIT B (KITS) ×2 IMPLANT
MESH BARD SOFT 3X6IN (Mesh General) ×1 IMPLANT
NDL HYPO 25GX1X1/2 BEV (NEEDLE) ×1 IMPLANT
NEEDLE HYPO 25GX1X1/2 BEV (NEEDLE) ×2 IMPLANT
NS IRRIG 1000ML POUR BTL (IV SOLUTION) ×2 IMPLANT
PACK SURGICAL SETUP 50X90 (CUSTOM PROCEDURE TRAY) ×2 IMPLANT
PENCIL BUTTON HOLSTER BLD 10FT (ELECTRODE) ×2 IMPLANT
SPECIMEN JAR SMALL (MISCELLANEOUS) IMPLANT
SPONGE T-LAP 18X18 ~~LOC~~+RFID (SPONGE) ×2 IMPLANT
STRIP CLOSURE SKIN 1/2X4 (GAUZE/BANDAGES/DRESSINGS) ×2 IMPLANT
SUT MNCRL AB 4-0 PS2 18 (SUTURE) ×2 IMPLANT
SUT NOVA NAB GS-21 0 18 T12 DT (SUTURE) ×1 IMPLANT
SUT NOVA NAB GS-22 2 0 T19 (SUTURE) ×4 IMPLANT
SUT VIC AB 3-0 SH 18 (SUTURE) ×2 IMPLANT
SYR BULB EAR ULCER 3OZ GRN STR (SYRINGE) ×2 IMPLANT
SYR CONTROL 10ML LL (SYRINGE) ×2 IMPLANT
TOWEL GREEN STERILE (TOWEL DISPOSABLE) ×2 IMPLANT
TOWEL GREEN STERILE FF (TOWEL DISPOSABLE) ×2 IMPLANT
TUBE CONNECTING 12X1/4 (SUCTIONS) IMPLANT
YANKAUER SUCT BULB TIP NO VENT (SUCTIONS) IMPLANT

## 2020-11-09 NOTE — Op Note (Signed)
Procedure Note  Pre-operative Diagnosis:  right inguinal hernia, reducible  Post-operative Diagnosis: same  Procedure:  Open right inguinal hernia repair with mesh  Surgeon:  Darnell Level, MD  Anesthesia:  General  Preparation:  Chlora-prep  Estimated Blood Loss: minimal  Complications:  none  Indications: The patient presented with a right, reducible hernia.    Patient is referred by his primary care physician, Dr. Irena Reichmann, for surgical evaluation and management of newly diagnosed right inguinal hernia. Patient's cardiologist is Dr. Nanetta Batty. Patient has a history of left inguinal hernia repair performed approximately 20 years ago in [employer], [place]. This had an early recurrence and the patient was seen in our practice by Dr. Cyndia Bent who performed a repair on the left side using mesh. The patient has had no sign of recurrence since that time. Over the past several weeks the patient has noted a bulge in the right groin. This has become larger and more symptomatic. It is always been reducible. The patient has attempted wearing a truss without success. He has had no signs or symptoms of obstruction. It does cause discomfort. He presents today for evaluation and to discuss hernia repair.  Procedure Details  The patient was evaluated in the holding area. All of the patient's questions were answered and the proposed procedure was confirmed. The site of the procedure was properly marked. The patient was taken to the Operating Room, identified by name, and the procedure verified as inguinal hernia repair.  The patient was placed in the supine position and underwent induction of anesthesia. A "Time Out" was performed per routine. The lower abdomen and groin were prepped and draped in the usual aseptic fashion.  After ascertaining that an adequate level of anesthesia had been obtained, an incision was made in the groin with a #10 blade.  Dissection was carried through  the subcutaneous tissues and hemostasis obtained with the electrocautery.  A Gelpi retractor was placed for exposure.  The external oblique fascia was incised in line with it's fibers and extended through the external inguinal ring.  The cord structures were dissected out of the inguinal canal and encircled with a Penrose drain.  The floor of the inguinal canal was dissected out.  There is a small direct defect with a small sac that is dissected out and reduced.  The defect is closed with a 0-Novofil figure of eight suture.  The cord was explored and moderate sized indirect sac is dissected out.  A high ligation is performed with a 0-Novofil suture and the sac excised and discarded.  The floor of the inguinal canal was reconstructed with Bard Softmesh cut to the appropriate dimensions.  It was secured to the pubic tubercle with a 2-0 Novafil suture and along the inguinal ligament with a running 2-0 Novafil suture.  Mesh was split to accommodate the cord structures.  The superior margin of the mesh was secured to the transversalis and internal oblique musculature with interrupted 2-0 Novafil sutures.  The tails of the mesh were overlapped lateral to the cord structures and secured to the inguinal ligament with interrupted 2-0 Novafil sutures to recreate the internal inguinal ring.  Cord structures were returned to the inguinal canal.  Local anesthetic was infiltrated throughout the field.  External oblique fascia was closed with interrupted 3-0 Vicryl sutures.  Subcutaneous tissues were closed with interrupted 3-0 Vicryl sutures.  Skin was anesthetized with local anesthetic, and the skin edges were re-approximated with a running 4-0 Monocryl suture.  Wound  was washed and dried and Dermabond was applied.  Instrument, sponge, and needle counts were correct prior to closure and at the conclusion of the case.  The patient tolerated the procedure well.  The patient was awakened from anesthesia and brought to the  recovery room in stable condition.  Darnell Level, MD [facility] Office: [phone]

## 2020-11-09 NOTE — Interval H&P Note (Signed)
History and Physical Interval Note:  11/09/2020 11:12 AM  David Sellers  has presented today for surgery, with the diagnosis of RIGHT INGUINAL HERNIA, REDUCIBLE.  The various methods of treatment have been discussed with the patient and family. After consideration of risks, benefits and other options for treatment, the patient has consented to    Procedure(s): OPEN REPAIR RIGHT INGUINAL HERNIA WITH MESH (Right) as a surgical intervention.    The patient's history has been reviewed, patient examined, no change in status, stable for surgery.  I have reviewed the patient's chart and labs.  Questions were answered to the patient's satisfaction.    Darnell Level, MD [facility] practice Office: [phone]   Darnell Level

## 2020-11-09 NOTE — Transfer of Care (Signed)
Immediate Anesthesia Transfer of Care Note  Patient: David Sellers  Procedure(s) Performed: OPEN REPAIR RIGHT INGUINAL HERNIA (Right: Groin) INSERTION OF MESH (Right: Groin)  Patient Location: PACU  Anesthesia Type:General  Level of Consciousness: awake, alert , oriented and patient cooperative  Airway & Oxygen Therapy: Patient Spontanous Breathing and Patient connected to face mask oxygen  Post-op Assessment: Report given to RN, Post -op Vital signs reviewed and stable and Patient moving all extremities X 4  Post vital signs: Reviewed and stable  Last Vitals:  Vitals Value Taken Time  BP 165/87 11/09/20 1308  Temp 36.5 C 11/09/20 1308  Pulse 66 11/09/20 1316  Resp 21 11/09/20 1316  SpO2 100 % 11/09/20 1316  Vitals shown include unvalidated device data.  Last Pain:  Vitals:   11/09/20 1308  TempSrc:   PainSc: 0-No pain         Complications: No notable events documented.

## 2020-11-09 NOTE — Anesthesia Procedure Notes (Signed)
Procedure Name: Intubation Date/Time: 11/09/2020 11:56 AM Performed by: Jonna Munro, CRNA Pre-anesthesia Checklist: Patient identified, Emergency Drugs available, Suction available, Patient being monitored and Timeout performed Patient Re-evaluated:Patient Re-evaluated prior to induction Oxygen Delivery Method: Circle system utilized Preoxygenation: Pre-oxygenation with 100% oxygen Induction Type: IV induction Ventilation: Two handed mask ventilation required Laryngoscope Size: Mac and 3 Grade View: Grade I Tube type: Oral Tube size: 7.0 mm Number of attempts: 1 Airway Equipment and Method: Stylet Placement Confirmation: ETT inserted through vocal cords under direct vision, positive ETCO2 and breath sounds checked- equal and bilateral Secured at: 22 cm Tube secured with: Tape Dental Injury: Teeth and Oropharynx as per pre-operative assessment

## 2020-11-09 NOTE — Anesthesia Procedure Notes (Signed)
Arterial Line Insertion Start/End10/04/2020 11:25 AM Performed by: Garfield Cornea, CRNA, CRNA  Patient location: Pre-op. Preanesthetic checklist: patient identified, IV checked, site marked, risks and benefits discussed, surgical consent, monitors and equipment checked, pre-op evaluation, timeout performed and anesthesia consent Lidocaine 1% used for infiltration Right, radial was placed Catheter size: 20 G Hand hygiene performed  and maximum sterile barriers used   Attempts: 1 Procedure performed without using ultrasound guided technique. Following insertion, dressing applied and Biopatch. Post procedure assessment: normal and unchanged  Patient tolerated the procedure well with no immediate complications.

## 2020-11-09 NOTE — Discharge Instructions (Addendum)
[facility]  HERNIA REPAIR POST OP INSTRUCTIONS  Always review your discharge instruction sheet given to you by the facility where your surgery was performed.  A  prescription for pain medication may be sent to your pharmacy on discharge.  Take your pain medication as prescribed.  If narcotic pain medicine is not needed, then you may take acetaminophen (Tylenol) or ibuprofen (Advil) as needed.  Take your usually prescribed medications unless otherwise directed.  If you need a refill on your pain medication, please contact your pharmacy.  They will contact our office to request authorization. Prescriptions will not be filled after 5:00 PM daily or on weekends.  You should follow a light diet the first 24 hours after arrival home, such as soup and crackers or toast.  Be sure to include plenty of fluids daily.  Resume your normal diet the day after surgery.  Most patients will experience some swelling and bruising around the surgical site.  Ice packs and reclining will help.  Swelling and bruising can take several days to resolve.   It is common to experience some constipation if taking pain medication after surgery.  Increasing fluid intake and taking a stool softener (such as Colace) will usually help or prevent this problem from occurring.  A mild laxative (Milk of Magnesia or Miralax) should be taken according to package directions if there is no bowel movement after 48 hours.  You will likely have Dermabond (topical glue) over your incisions.  This seals the incisions and allows you to bathe and shower at any time after your surgery.  Glue should remain in place for up to 10 days.  It may be removed after 10 days by pealing off the Dermabond material or using Vaseline or naval jelly to remove.  ACTIVITIES:  You may resume regular (light) daily activities beginning the next day - such as daily self-care, walking, climbing stairs - gradually increasing activities as tolerated.  You  may have sexual intercourse when it is comfortable.  Refrain from any heavy lifting or straining until approved by your doctor.  You may drive when you are no longer taking prescription pain medication, when you can comfortably wear a seatbelt, and when you can safely maneuver your car and apply the brakes.  You should see your doctor in the office for a follow-up appointment approximately 2-3 weeks after your surgery.  Make sure that you call for this appointment within a day or two after you arrive home to insure a convenient appointment time.  WHEN TO CALL YOUR DOCTOR: Fever greater than 101.0 Inability to urinate Persistent nausea and/or vomiting Extreme swelling or bruising Continued bleeding from incision Increased pain, redness, or drainage from the incision  The clinic staff is available to answer your questions during regular business hours.  Please don't hesitate to call and ask to speak to one of the nurses for clinical concerns.  If you have a medical emergency, go to the nearest emergency room or call 911.  A surgeon from [facility] is always on call for the hospital.   [facility] [address], [address], [place], [place]  [address]  [phone] ? [phone] ? FAX [phone] 

## 2020-11-09 NOTE — Anesthesia Postprocedure Evaluation (Signed)
Anesthesia Post Note  Patient: David Sellers  Procedure(s) Performed: OPEN REPAIR RIGHT INGUINAL HERNIA (Right: Groin) INSERTION OF MESH (Right: Groin)     Patient location during evaluation: PACU Anesthesia Type: General Level of consciousness: awake and alert and oriented Pain management: pain level controlled Vital Signs Assessment: post-procedure vital signs reviewed and stable Respiratory status: spontaneous breathing, nonlabored ventilation and respiratory function stable Cardiovascular status: blood pressure returned to baseline Postop Assessment: no apparent nausea or vomiting Anesthetic complications: no   No notable events documented.  Last Vitals:  Vitals:   11/09/20 1324 11/09/20 1335  BP: 109/66 123/76  Pulse: 63 (!) 58  Resp: 18 19  Temp:  36.5 C  SpO2: 100% 100%    Last Pain:  Vitals:   11/09/20 1335  TempSrc:   PainSc: 0-No pain                 Shanda Howells

## 2020-11-10 ENCOUNTER — Encounter (HOSPITAL_COMMUNITY): Payer: Self-pay | Admitting: Surgery

## 2020-11-11 ENCOUNTER — Encounter (HOSPITAL_COMMUNITY): Payer: PPO | Admitting: Cardiology

## 2020-11-12 ENCOUNTER — Encounter (HOSPITAL_COMMUNITY): Payer: PPO | Admitting: Cardiology

## 2020-11-12 DIAGNOSIS — R338 Other retention of urine: Secondary | ICD-10-CM | POA: Diagnosis not present

## 2020-11-16 ENCOUNTER — Telehealth: Payer: Self-pay

## 2020-11-16 DIAGNOSIS — E785 Hyperlipidemia, unspecified: Secondary | ICD-10-CM

## 2020-11-16 NOTE — Telephone Encounter (Signed)
Pt called in and stated that they are ready to start the repatha since having the hernia repair. Routing to [employer] for orders to start pa and order labs

## 2020-11-17 MED ORDER — REPATHA SURECLICK 140 MG/ML ~~LOC~~ SOAJ: 140.0000 mg | SUBCUTANEOUS | 11 refills | Status: DC

## 2020-11-17 NOTE — Telephone Encounter (Signed)
Please complete prior authorization for: ? ?Name of medication, dose, and frequency Repatha 140mg sq q 14 days ? ?Lab Orders Requested? yes ? ?Which labs? Lipid panel ? ?Estimated date for labs to be scheduled 2-3 months ? ?Does patient need activated copay card? no  ?

## 2020-11-17 NOTE — Telephone Encounter (Signed)
Called and spoke w/pt and stated that the repatha was approved, rxsent, instructed the pt to complete fasting labs post 4th injection

## 2020-11-17 NOTE — Telephone Encounter (Signed)
PA SUBMITTED FOR REPATHA David Sellers (Key: [id number]) Repatha SureClick 140MG /ML auto-injectors  LIPID PANEL ORDERED AND RELEASED

## 2020-11-17 NOTE — Addendum Note (Signed)
Addended by: Eather Colas on: 11/17/2020 10:54 AM   Modules accepted: Orders

## 2020-11-20 ENCOUNTER — Telehealth: Payer: Self-pay

## 2020-11-20 NOTE — Telephone Encounter (Signed)
Patient called.  Having urinary issues. since hernia surgery. Not able to fully empty bladder Had urinary catheter placed and then removed. Urologist first placed patient on Tamsulosin daily but patient researched and was concerned about drug interactions.  Urologist then switched him to alfusozin.   Recommended he hold doxazosin at this time since medication is similar class to tamusulosin and alfusozin.  Recommended he start tamsulosin 0.4mg  at this time because it was more important he be able to urinate.  Patient voiced understanding.

## 2020-11-20 NOTE — Telephone Encounter (Signed)
Patient called and needs to discuss his heart medications with you.  Please call to advise.  Thank you

## 2020-11-22 DIAGNOSIS — R300 Dysuria: Secondary | ICD-10-CM | POA: Diagnosis not present

## 2020-11-22 DIAGNOSIS — R350 Frequency of micturition: Secondary | ICD-10-CM | POA: Diagnosis not present

## 2020-11-24 ENCOUNTER — Encounter (HOSPITAL_COMMUNITY): Payer: HMO

## 2020-11-24 DIAGNOSIS — H18422 Band keratopathy, left eye: Secondary | ICD-10-CM | POA: Diagnosis not present

## 2020-11-24 DIAGNOSIS — H1820 Unspecified corneal edema: Secondary | ICD-10-CM | POA: Diagnosis not present

## 2020-11-24 DIAGNOSIS — H1789 Other corneal scars and opacities: Secondary | ICD-10-CM | POA: Diagnosis not present

## 2020-11-24 NOTE — Progress Notes (Signed)
PCP: Irena Reichmann, DO Cardiology: Dr. Allyson Sabal HF Cardiology: Dr. Shirlee Latch  80 y.o. with history of cardiomyopathy of uncertain etiology, carotid stenosis, HTN, type 2 diabetes, new diagnosis of CAD,  was referred by Dr. Allyson Sabal for evaluation of CHF.  Patient had left [facility] in 4/15.  Cardiolite in 3/15 prior to [facility] was normal.  He generally did well until 12/21.  Starting at that time, he had 3 episodes of "pneumonia."  In 2/22, due to respiratory symptoms, he had a CT chest.  This showed 3 vessel coronary calcification and ground glass opacities concerning for pulmonary edema. He saw a pulmonologist, and was thought to have pulmonary edema as a cause of his dyspnea (not a primary pulmonary problem).  He does not smoke.  Echo was done in 5/22 due to ongoing dyspnea and fatigue, showing EF 25% with normal RV, PASP 86 mmHg.  Cardiolite was then done in 6/22, showing EF 25% with evidence for prior inferior/inferolateral MI with peri-infarct ischemia.   Seen by Dr. Shirlee Latch 7/22 for evaluation of CHF. Denies CP, had NYHA II symptoms. He was scheduled for [facility].  Underwent [employer] 09/04/20 showing severe coronary disease with critical RCA stenosis, occluded ostial LCX, L main disease, and preserved cardiac output. He was admitted post cath and CT surgery consulted. Patient declined surgery after discussion of his moderate risk. Cardiac MRI showed substantial viability, but with patient having minimal symptoms, decision made to manage medically.  Plavix was added at discharge.  Today she returns for HF follow up. His main complaint is generalized itchiness. Recently seen at [facility] for possible UTI and given Rx for Cipro, symptoms resolved. Previously tried flomax and afluzosin for urinary issues after foley catheter removal from recent hernia surgery, but he says these medications caused increased blood sugars. He does not have exertional dyspnea. He denies CP, dizziness, edema, or PND/Orthopnea. Appetite ok. No fever or  chills. Weight at home 131 pounds. Taking all medications. BP at home 130s/72, get's "white coat" here when his BP is checked. Continues to work as a Education officer, environmental.  Labs (4/22): K 3.7, creatinine 1.0 Labs (8/22): K 4.6, creatinine 0.89 Labs (9/22): K 4.4, creatinine 0.90  ECG (personally reviewed): none ordered today.  PMH:  1. HTN 2. BPH 3. Carotid stenosis: Left CEA in 4/15.  4. Type 2 diabetes 5. Chronic systolic CHF: Cardiomyopathy of uncertain etiology.  - Cardiolite in 3/15 was normal - CT chest in 2/22 with 3 vessel CAD and patchy GGO that may be due to pulmonary edema.  - Echo (5/22): EF 20-25%, normal RV, mild-moderate MR, PASP 86 mmHg.  - Cardiolite (6/22): EF 25%, large primarily fixed inferior/inferolateral defect => suspect prior MI with peri-infarct ischemia.  - RHC (09/04/20): Low filling pressures, preserved CO. RA 1, PA 19/8 PCWP 1, PA 76%, CO 4.8, CI 2.8  6. Hyperlipidemia: He has not tolerated any statin other than lovastatin.  7. CAD: severe on cath (7/22) with critical RCA stenosis, occluded ostial LCx, left main disease. - cMRI (7/22): EF 25-50%, normal RV,  substantial cardiac viability.  Social History   Socioeconomic History   Marital status: Married    Spouse name: Not on file   Number of children: Not on file   Years of education: Not on file   Highest education level: Not on file  Occupational History   Not on file  Tobacco Use   Smoking status: Never   Smokeless tobacco: Never  Vaping Use   Vaping Use: Never used  Substance  and Sexual Activity   Alcohol use: No   Drug use: No   Sexual activity: Yes  Other Topics Concern   Not on file  Social History Narrative   Not on file   Social Determinants of Health   Financial Resource Strain: Not on file  Food Insecurity: Not on file  Transportation Needs: Not on file  Physical Activity: Not on file  Stress: Not on file  Social Connections: Not on file  Intimate Partner Violence: Not on file    Family History  Adopted: Yes  Problem Relation Age of Onset   Heart disease Father    Heart attack Father 70   Hypertension Father    Hyperlipidemia Father    Diabetes Daughter    Hyperlipidemia Daughter    Hypertension Daughter    Diabetes Daughter    Hyperlipidemia Daughter    Hypertension Daughter    Kidney disease Mother    ROS: All systems reviewed and negative except as per HPI.   Current Outpatient Medications  Medication Sig Dispense Refill   acetaminophen (TYLENOL) 650 MG CR tablet Take 1,300 mg by mouth in the morning and at bedtime.     aspirin EC 81 MG tablet Take 81 mg by mouth every morning.     carvedilol (COREG) 6.25 MG tablet Take 1 tablet each morning and 2 tablets each night. 270 tablet 3   cetirizine (ZYRTEC) 10 MG tablet Take 10 mg by mouth at bedtime.     ciprofloxacin (CIPRO) 500 MG tablet Take 500 mg by mouth 2 (two) times daily.     clopidogrel (PLAVIX) 75 MG tablet Take 1 tablet (75 mg total) by mouth daily. 30 tablet 11   Krill Oil 500 MG CAPS Take 500 mg by mouth at bedtime.     lovastatin (MEVACOR) 20 MG tablet Take 20 mg by mouth at bedtime.     metFORMIN (GLUCOPHAGE-XR) 500 MG 24 hr tablet Take 1 tablet (500 mg total) by mouth 2 (two) times daily with breakfast and lunch.     ONETOUCH VERIO test strip 2 (two) times daily.     pantoprazole (PROTONIX) 40 MG tablet Take 1 tablet (40 mg total) by mouth daily. 30 tablet 5   polyvinyl alcohol (LIQUIFILM TEARS) 1.4 % ophthalmic solution Place 1 drop into both eyes 3 (three) times daily as needed for dry eyes.     sacubitril-valsartan (ENTRESTO) 49-51 MG Take 1 tablet by mouth 2 (two) times daily. 60 tablet 6   spironolactone (ALDACTONE) 25 MG tablet Take 0.5 tablets (12.5 mg total) by mouth daily. 45 tablet 3   Evolocumab (REPATHA SURECLICK) 140 MG/ML SOAJ Inject 140 mg into the skin every 14 (fourteen) days. (Patient not taking: Reported on 11/26/2020) 2 mL 11   No current facility-administered  medications for this encounter.   Wt Readings from Last 3 Encounters:  11/26/20 61.1 kg (134 lb 12.8 oz)  11/09/20 59.7 kg (131 lb 9.6 oz)  11/03/20 61.7 kg (136 lb)   BP (!) 166/78   Pulse 70   Wt 61.1 kg (134 lb 12.8 oz)   SpO2 98%   BMI 21.76 kg/m   General:  NAD. No resp difficulty, elderly HEENT: Normal, bilateral hearing aids Neck: Supple. No JVD. Carotids 2+ bilat; no bruits. No lymphadenopathy or thryomegaly appreciated. Cor: PMI nondisplaced. Regular rate & rhythm. No rubs, gallops or murmurs. Lungs: Clear Abdomen: Soft, nontender, nondistended. No hepatosplenomegaly. No bruits or masses. Good bowel sounds. Extremities: No cyanosis, clubbing, rash, edema Neuro:  Alert & oriented x 3, cranial nerves grossly intact. Moves all 4 extremities w/o difficulty. Affect pleasant.   Assessment/Plan: 1. CAD: Cath 8/22 with severe coronary disease with critical RCA stenosis, occluded ostial LCX, L main disease, and preserved cardiac output. CT surgery consulted for possible CABG and feels he would be at least moderate risk due to his low EF, age, and other comorbidities. After weighing options, pt declines surgery. Plan medical management.  If he develops chest pain in the future, intervention on RCA +/- LM would be feasible and his cardiac MRI shows substantial viability.   - Denies ischemic CP. - Continue ASA 81 mg + Plavix + beta blocker. - Intolerant of statins except for lovastatin. Recent LDL 93 - Would benefit from lipid clinic referral for PCSK9i or Leqvio as LDL not at goal <70.  2.  Chronic systolic CHF: Ischemic cardiomyopathy.  Echo in 5/22 showed EF 20-25% with normal RV, interestingly, the PA systolic pressure estimation was severely elevated at 86 mmHg. RHC 8/22 with low filling pressure, preserved cardiac output, actually no pulmonary hypertension.  NYHA II, he is not volume overloaded today. - Did not tolerate Jardiance (hallucinations), will not try [place] as he has many  medication intolerances/allergies. - Continue Entresto 49/51 mg bid. - Continue Coreg 6.25 mg qam/ 12.5 mg qpm.  - Continue spironolactone 12.5 mg daily.   - Does not need Lasix at this time. He tells me it lowers his BP significantly. 3. Carotid stenosis: S/p left CEA.   - Carotid dopplers followed by [facility]. - Patient is on lovastatin, says he cannot take other statins. He has been referred to the Lipid Clinic for PCSK9i or Leqvio. - Continue ASA 81 daily. 4. HTN: Controlled on current regimen.  5. Snoring/fatigue: Sleep study negative for OSA. 6. Type 2DM: Off Jardiance with hallucinations.  Follow up with Dr. Shirlee Latch next month as scheduled.  [facility] FNP 11/26/2020

## 2020-11-26 ENCOUNTER — Other Ambulatory Visit: Payer: Self-pay

## 2020-11-26 ENCOUNTER — Ambulatory Visit (HOSPITAL_COMMUNITY)
Admission: RE | Admit: 2020-11-26 | Discharge: 2020-11-26 | Disposition: A | Discharge status: Home / Self Care | Payer: HMO | Source: Ambulatory Visit | Attending: Family Medicine | Admitting: Family Medicine

## 2020-11-26 ENCOUNTER — Encounter (HOSPITAL_COMMUNITY): Payer: Self-pay

## 2020-11-26 VITALS — BP 166/78 | HR 70 | Wt 134.8 lb

## 2020-11-26 DIAGNOSIS — I5022 Chronic systolic (congestive) heart failure: Secondary | ICD-10-CM | POA: Diagnosis not present

## 2020-11-26 DIAGNOSIS — Z7982 Long term (current) use of aspirin: Secondary | ICD-10-CM | POA: Insufficient documentation

## 2020-11-26 DIAGNOSIS — E782 Mixed hyperlipidemia: Secondary | ICD-10-CM

## 2020-11-26 DIAGNOSIS — R0683 Snoring: Secondary | ICD-10-CM

## 2020-11-26 DIAGNOSIS — E119 Type 2 diabetes mellitus without complications: Secondary | ICD-10-CM | POA: Diagnosis not present

## 2020-11-26 DIAGNOSIS — Z7901 Long term (current) use of anticoagulants: Secondary | ICD-10-CM | POA: Insufficient documentation

## 2020-11-26 DIAGNOSIS — Z8249 Family history of ischemic heart disease and other diseases of the circulatory system: Secondary | ICD-10-CM | POA: Diagnosis not present

## 2020-11-26 DIAGNOSIS — I255 Ischemic cardiomyopathy: Secondary | ICD-10-CM | POA: Insufficient documentation

## 2020-11-26 DIAGNOSIS — I10 Essential (primary) hypertension: Secondary | ICD-10-CM

## 2020-11-26 DIAGNOSIS — I252 Old myocardial infarction: Secondary | ICD-10-CM | POA: Diagnosis not present

## 2020-11-26 DIAGNOSIS — Z79899 Other long term (current) drug therapy: Secondary | ICD-10-CM | POA: Diagnosis not present

## 2020-11-26 DIAGNOSIS — I6529 Occlusion and stenosis of unspecified carotid artery: Secondary | ICD-10-CM

## 2020-11-26 DIAGNOSIS — Z7984 Long term (current) use of oral hypoglycemic drugs: Secondary | ICD-10-CM | POA: Insufficient documentation

## 2020-11-26 DIAGNOSIS — I110 Hypertensive heart disease with heart failure: Secondary | ICD-10-CM | POA: Diagnosis not present

## 2020-11-26 DIAGNOSIS — I5020 Unspecified systolic (congestive) heart failure: Secondary | ICD-10-CM

## 2020-11-26 DIAGNOSIS — I2510 Atherosclerotic heart disease of native coronary artery without angina pectoris: Secondary | ICD-10-CM | POA: Diagnosis not present

## 2020-11-26 DIAGNOSIS — Z7902 Long term (current) use of antithrombotics/antiplatelets: Secondary | ICD-10-CM | POA: Diagnosis not present

## 2020-11-26 LAB — BASIC METABOLIC PANEL
Anion gap: 10 (ref 5–15)
BUN: 13 mg/dL (ref 8–23)
CO2: 25 mmol/L (ref 22–32)
Calcium: 9.7 mg/dL (ref 8.9–10.3)
Chloride: 106 mmol/L (ref 98–111)
Creatinine, Ser: 1.08 mg/dL (ref 0.61–1.24)
GFR, Estimated: >60 mL/min (ref >60)
Glucose, Bld: 204 mg/dL — ABNORMAL HIGH (ref 70–99)
Potassium: 4.7 mmol/L (ref 3.5–5.1)
Sodium: 141 mmol/L (ref 135–145)

## 2020-11-26 NOTE — Patient Instructions (Addendum)
Labs done today. We will contact you only if your labs are abnormal.  No medication changes were made. Please continue all current medications as prescribed.  Your physician recommends that you keep your scheduled appointment with Dr. Shirlee Latch on Wednesday November 9th 2022 at 11:20am GATE [id number]  If you have any questions or concerns before your next appointment please send [place] a message through [place] or call our office at [phone].    TO LEAVE A MESSAGE FOR THE NURSE SELECT OPTION 2, PLEASE LEAVE A MESSAGE INCLUDING: YOUR NAME DATE OF BIRTH CALL BACK NUMBER REASON FOR CALL**this is important as we prioritize the call backs  YOU WILL RECEIVE A CALL BACK THE SAME DAY AS LONG AS YOU CALL BEFORE 4:00 PM   Do the following things EVERYDAY: Weigh yourself in the morning before breakfast. Write it down and keep it in a log. Take your medicines as prescribed Eat low salt foods--Limit salt (sodium) to 2000 mg per day.  Stay as active as you can everyday Limit all fluids for the day to less than 2 liters   At the [facility], you and your health needs are our priority. As part of our continuing mission to provide you with exceptional heart care, we have created designated Provider Care Teams. These Care Teams include your primary Cardiologist (physician) and Advanced Practice Providers (APPs- Physician Assistants and Nurse Practitioners) who all work together to provide you with the care you need, when you need it.   You may see any of the following providers on your designated Care Team at your next follow up: Dr Arvilla Meres Dr Carron Curie, NP Robbie Lis, [place] Karle Plumber, PharmD   Please be sure to bring in all your medications bottles to every appointment.

## 2020-12-08 DIAGNOSIS — H18422 Band keratopathy, left eye: Secondary | ICD-10-CM | POA: Diagnosis not present

## 2020-12-08 DIAGNOSIS — H1789 Other corneal scars and opacities: Secondary | ICD-10-CM | POA: Diagnosis not present

## 2020-12-12 ENCOUNTER — Other Ambulatory Visit: Payer: Self-pay | Admitting: Cardiology

## 2020-12-16 ENCOUNTER — Encounter (HOSPITAL_COMMUNITY): Payer: Self-pay | Admitting: Cardiology

## 2020-12-16 ENCOUNTER — Ambulatory Visit (HOSPITAL_COMMUNITY)
Admission: RE | Admit: 2020-12-16 | Discharge: 2020-12-16 | Disposition: A | Discharge status: Home / Self Care | Payer: HMO | Source: Ambulatory Visit | Attending: Cardiology | Admitting: Cardiology

## 2020-12-16 VITALS — BP 164/80 | HR 71 | Wt 138.0 lb

## 2020-12-16 DIAGNOSIS — I110 Hypertensive heart disease with heart failure: Secondary | ICD-10-CM | POA: Insufficient documentation

## 2020-12-16 DIAGNOSIS — R443 Hallucinations, unspecified: Secondary | ICD-10-CM | POA: Insufficient documentation

## 2020-12-16 DIAGNOSIS — E119 Type 2 diabetes mellitus without complications: Secondary | ICD-10-CM | POA: Insufficient documentation

## 2020-12-16 DIAGNOSIS — I6529 Occlusion and stenosis of unspecified carotid artery: Secondary | ICD-10-CM | POA: Diagnosis not present

## 2020-12-16 DIAGNOSIS — I2510 Atherosclerotic heart disease of native coronary artery without angina pectoris: Secondary | ICD-10-CM

## 2020-12-16 DIAGNOSIS — I255 Ischemic cardiomyopathy: Secondary | ICD-10-CM | POA: Insufficient documentation

## 2020-12-16 DIAGNOSIS — Z833 Family history of diabetes mellitus: Secondary | ICD-10-CM | POA: Insufficient documentation

## 2020-12-16 DIAGNOSIS — I5020 Unspecified systolic (congestive) heart failure: Secondary | ICD-10-CM | POA: Diagnosis not present

## 2020-12-16 DIAGNOSIS — Z8249 Family history of ischemic heart disease and other diseases of the circulatory system: Secondary | ICD-10-CM | POA: Insufficient documentation

## 2020-12-16 DIAGNOSIS — Z7982 Long term (current) use of aspirin: Secondary | ICD-10-CM | POA: Insufficient documentation

## 2020-12-16 DIAGNOSIS — Z7902 Long term (current) use of antithrombotics/antiplatelets: Secondary | ICD-10-CM | POA: Diagnosis not present

## 2020-12-16 DIAGNOSIS — I6522 Occlusion and stenosis of left carotid artery: Secondary | ICD-10-CM | POA: Insufficient documentation

## 2020-12-16 DIAGNOSIS — I5022 Chronic systolic (congestive) heart failure: Secondary | ICD-10-CM | POA: Diagnosis not present

## 2020-12-16 DIAGNOSIS — Z7984 Long term (current) use of oral hypoglycemic drugs: Secondary | ICD-10-CM | POA: Insufficient documentation

## 2020-12-16 MED ORDER — SPIRONOLACTONE 25 MG PO TABS: 25.0000 mg | ORAL_TABLET | Freq: Every day | ORAL | 3 refills | Status: DC

## 2020-12-16 NOTE — Patient Instructions (Addendum)
Labs done today. We will contact you only if your labs are abnormal.  INCREASE Spironolactone to 25mg  (1 tablet) by mouth daily.   RESTART Repatha  No other medication changes were made. Please continue all current medications as prescribed.  Your physician recommends that you schedule a follow-up appointment in: 10 days for a lab only appointment, 2 months for a lab only appointment and in 6 weeks with our NP/[facility] here in our office.   If you have any questions or concerns before your next appointment please send a message through [place] or call our office at [phone].    TO LEAVE A MESSAGE FOR THE NURSE SELECT OPTION 2, PLEASE LEAVE A MESSAGE INCLUDING: YOUR NAME DATE OF BIRTH CALL BACK NUMBER REASON FOR CALL**this is important as we prioritize the call backs  YOU WILL RECEIVE A CALL BACK THE SAME DAY AS LONG AS YOU CALL BEFORE 4:00 PM   Do the following things EVERYDAY: Weigh yourself in the morning before breakfast. Write it down and keep it in a log. Take your medicines as prescribed Eat low salt foods--Limit salt (sodium) to 2000 mg per day.  Stay as active as you can everyday Limit all fluids for the day to less than 2 liters   At the [facility], you and your health needs are our priority. As part of our continuing mission to provide you with exceptional heart care, we have created designated Provider Care Teams. These Care Teams include your primary Cardiologist (physician) and Advanced Practice Providers (APPs- Physician Assistants and Nurse Practitioners) who all work together to provide you with the care you need, when you need it.   You may see any of the following providers on your designated Care Team at your next follow up: Dr [phone] Dr Arvilla Meres, NP Carron Curie, Robbie Lis [place], PharmD   Please be sure to bring in all your medications bottles to every appointment.

## 2020-12-17 NOTE — Progress Notes (Signed)
PCP: Irena Reichmann, DO Cardiology: Dr. Allyson Sabal HF Cardiology: Dr. Shirlee Latch  80 y.o. with history of cardiomyopathy of uncertain etiology, carotid stenosis, HTN, type 2 diabetes, and CAD was referred by Dr. Allyson Sabal for evaluation of CHF.  Patient had left CEA in 4/15.  Cardiolite in 3/15 prior to CEA was normal.  He generally did well until 12/21.  Starting at that time, he had 3 episodes of "pneumonia."  In 2/22, due to respiratory symptoms, he had a CT chest.  This showed 3 vessel coronary calcification and ground glass opacities concerning for pulmonary edema. He saw a pulmonologist, and was thought to have pulmonary edema as a cause of his dyspnea (not a primary pulmonary problem).  He does not smoke.  Echo was done in 5/22 due to ongoing dyspnea and fatigue, showing EF 25% with normal RV, PASP 86 mmHg.  Cardiolite was then done in 6/22, showing EF 25% with evidence for prior inferior/inferolateral MI with peri-infarct ischemia.   Underwent [facility] 09/04/20 showing severe coronary disease with critical RCA stenosis, occluded ostial LCX, 80% ostial L main disease, and preserved cardiac output. He was admitted post cath and CT surgery consulted. Patient declined surgery after discussion of his risk. Cardiac MRI showed substantial viability, but with patient having minimal symptoms, decision made to manage medically (he decided against high risk PCI RCA and left main).  Plavix was added at discharge.  Patient had right inguinal hernia repair in 10/22 with no complications.   He returns today for followup of CHF.  He continues to feel very good.  Continues to work as a Optician, dispensing.  No significant exertional dyspnea, he walks 30 minutes/day. No chest pain.  No problems walking up stairs.  He has not had to use any NTG.  BP high in the office but runs in the 120s systolic when he checks at home.   Labs (4/22): K 3.7, creatinine 1.0 Labs (8/22): K 4.6, creatinine 0.89, LDL 93 Labs (9/22): K 4.4, creatinine 0.90 Labs  (10/22): K 4.7, creatinine 1.08  PMH:  1. HTN 2. BPH 3. Carotid stenosis: Left CEA in 4/15.  4. Type 2 diabetes 5. Chronic systolic CHF: Ischemic cardiomyopathy.  - Cardiolite in 3/15 was normal - Echo (5/22): EF 20-25%, normal RV, mild-moderate MR, PASP 86 mmHg.  - Cardiolite (6/22): EF 25%, large primarily fixed inferior/inferolateral defect => suspect prior MI with peri-infarct ischemia.  - RHC (09/04/20): Low filling pressures, preserved CO. RA 1, PA 19/8 PCWP 1, PA 76%, CO 4.8, CI 2.8  6. Hyperlipidemia: He has not tolerated any statin other than lovastatin.  7. CAD: severe on cath (7/22) with critical 95% mRCA stenosis, occluded ostial LCx, 80% ostial left main disease, 95% D1, 60% mLAD.  - cMRI (7/22): EF 25-50%, normal RV,  substantial cardiac viability (nontransmural scar only in the basal-mid inferolateral wall).  - Patient refused CABG and declined high risk PCI.  8. Right inguinal hernia repair in 10/22.   Social History   Socioeconomic History   Marital status: Married    Spouse name: Not on file   Number of children: Not on file   Years of education: Not on file   Highest education level: Not on file  Occupational History   Not on file  Tobacco Use   Smoking status: Never   Smokeless tobacco: Never  Vaping Use   Vaping Use: Never used  Substance and Sexual Activity   Alcohol use: No   Drug use: No   Sexual activity: Yes  Other Topics Concern   Not on file  Social History Narrative   Not on file   Social Determinants of Health   Financial Resource Strain: Not on file  Food Insecurity: Not on file  Transportation Needs: Not on file  Physical Activity: Not on file  Stress: Not on file  Social Connections: Not on file  Intimate Partner Violence: Not on file   Family History  Adopted: Yes  Problem Relation Age of Onset   Heart disease Father    Heart attack Father 42   Hypertension Father    Hyperlipidemia Father    Diabetes Daughter     Hyperlipidemia Daughter    Hypertension Daughter    Diabetes Daughter    Hyperlipidemia Daughter    Hypertension Daughter    Kidney disease Mother    ROS: All systems reviewed and negative except as per HPI.   Current Outpatient Medications  Medication Sig Dispense Refill   acetaminophen (TYLENOL) 650 MG CR tablet Take 1,300 mg by mouth in the morning and at bedtime.     aspirin EC 81 MG tablet Take 81 mg by mouth every morning.     carvedilol (COREG) 6.25 MG tablet Take 1 tablet each morning and 2 tablets each night. 270 tablet 3   cetirizine (ZYRTEC) 10 MG tablet Take 10 mg by mouth at bedtime.     clopidogrel (PLAVIX) 75 MG tablet Take 1 tablet (75 mg total) by mouth daily. 30 tablet 11   Krill Oil 500 MG CAPS Take 500 mg by mouth at bedtime.     lovastatin (MEVACOR) 20 MG tablet Take 20 mg by mouth at bedtime.     metFORMIN (GLUCOPHAGE-XR) 500 MG 24 hr tablet Take 1 tablet (500 mg total) by mouth 2 (two) times daily with breakfast and lunch.     ONETOUCH VERIO test strip 2 (two) times daily.     pantoprazole (PROTONIX) 40 MG tablet TAKE 1 TABLET BY MOUTH EVERY DAY 90 tablet 1   polyvinyl alcohol (LIQUIFILM TEARS) 1.4 % ophthalmic solution Place 1 drop into both eyes 3 (three) times daily as needed for dry eyes.     sacubitril-valsartan (ENTRESTO) 49-51 MG Take 1 tablet by mouth 2 (two) times daily. 60 tablet 6   Evolocumab (REPATHA SURECLICK) 140 MG/ML SOAJ Inject 140 mg into the skin every 14 (fourteen) days. (Patient not taking: No sig reported) 2 mL 11   spironolactone (ALDACTONE) 25 MG tablet Take 1 tablet (25 mg total) by mouth daily. 90 tablet 3   No current facility-administered medications for this encounter.   Wt Readings from Last 3 Encounters:  12/16/20 62.6 kg (138 lb)  11/26/20 61.1 kg (134 lb 12.8 oz)  11/09/20 59.7 kg (131 lb 9.6 oz)   BP (!) 164/80   Pulse 71   Wt 62.6 kg (138 lb)   SpO2 98%   BMI 22.27 kg/m  General: NAD Neck: No JVD, no thyromegaly or  thyroid nodule.  Lungs: Clear to auscultation bilaterally with normal respiratory effort. CV: Nondisplaced PMI.  Heart regular S1/S2, no S3/S4, no murmur.  No peripheral edema.  No carotid bruit.  Normal pedal pulses.  Abdomen: Soft, nontender, no hepatosplenomegaly, no distention.  Skin: Intact without lesions or rashes.  Neurologic: Alert and oriented x 3.  Psych: Normal affect. Extremities: No clubbing or cyanosis.  HEENT: Normal.   Assessment/Plan: 1. CAD: Cath 8/22 with severe coronary disease with critical RCA stenosis, occluded ostial LCX, 80% ostial L main disease, and preserved  cardiac output. CT surgery consulted for possible CABG and feels he would be at least moderate risk due to his low EF, age, and other comorbidities. After weighing options, pt declined surgery. Intervention on RCA and left main thought to be feasible and his cardiac MRI showed substantial viability, but he also declined high risk RCA + LM PCI.  He has had no anginal symptoms.  - Continue ASA 81 mg + Plavix + beta blocker. - Intolerant of statins except for lovastatin. Recent LDL 93. I will refer to lipid clinic for Repatha.  2.  Chronic systolic CHF: Ischemic cardiomyopathy.  Echo in 5/22 showed EF 20-25% with normal RV, interestingly, the PA systolic pressure estimation was severely elevated at 86 mmHg. RHC 8/22 with low filling pressure, preserved cardiac output, actually no pulmonary hypertension.  NYHA II, he is not volume overloaded today.   - Did not tolerate Jardiance (hallucinations), will not try [place] as he has many medication intolerances/allergies. - Continue Entresto 49/51 mg bid. - Continue Coreg 6.25 mg qam/12.5 mg qpm.  - Increase spironolactone to 25 mg daily.  BMET today and in 10 days.   - Does not need Lasix at this time. 3. Carotid stenosis: S/p left CEA.   - Carotid dopplers followed by [facility]. - Patient is on lovastatin, says he cannot take other statins. Referring to lipid clinic for  Repatha.  - Continue ASA 81 daily. 4. HTN: Controlled on current regimen.  5. Snoring/fatigue: Sleep study negative for OSA. 6. Type 2DM: Off Jardiance with hallucinations.  Follow up 6 wks with APP.   Marca Ancona  12/17/2020

## 2020-12-28 DIAGNOSIS — E785 Hyperlipidemia, unspecified: Secondary | ICD-10-CM | POA: Diagnosis not present

## 2020-12-29 ENCOUNTER — Telehealth (HOSPITAL_COMMUNITY): Payer: Self-pay | Admitting: *Deleted

## 2020-12-29 LAB — LIPID PANEL
Chol/HDL Ratio: 2.5 ratio (ref 0.0–5.0)
Cholesterol, Total: 124 mg/dL (ref 100–199)
HDL: 49 mg/dL (ref >39)
LDL Chol Calc (NIH): 44 mg/dL (ref 0–99)
Triglycerides: 195 mg/dL — ABNORMAL HIGH (ref 0–149)
VLDL Cholesterol Cal: 31 mg/dL (ref 5–40)

## 2020-12-29 NOTE — Telephone Encounter (Signed)
I would recommend that he see a dermatologist to look at the rash before we change anything.

## 2020-12-29 NOTE — Telephone Encounter (Signed)
Pt called stating plavix has caused severe eczema and he has to apply cream three times a day. Pt said he had eczema in the past but after starting eczema its worse. Pt asked if there is anything he can do to clear up eczema or should he switch medications.    Routed to Dr.McLean

## 2020-12-30 ENCOUNTER — Telehealth: Payer: Self-pay | Admitting: Pharmacist

## 2020-12-30 DIAGNOSIS — E782 Mixed hyperlipidemia: Secondary | ICD-10-CM

## 2020-12-30 DIAGNOSIS — I6523 Occlusion and stenosis of bilateral carotid arteries: Secondary | ICD-10-CM

## 2020-12-30 NOTE — Telephone Encounter (Signed)
Spoke with patient regarding lipid results. While on phone he asked if Dr. Shirlee Latch had commented on his Plavix. Advised per Dr Alford Highland note, patient should se a dermatologist before discontinuing any medications.  Patient voiced understanding.

## 2020-12-30 NOTE — Telephone Encounter (Signed)
Spoke with patient regarding LDL.  LDL at goal on Repatha. Pt will continue  Will stop lovastatin at this point due to joint pain.

## 2021-01-06 ENCOUNTER — Other Ambulatory Visit: Payer: Self-pay

## 2021-01-06 ENCOUNTER — Encounter: Payer: Self-pay | Admitting: Cardiovascular Disease

## 2021-01-06 ENCOUNTER — Ambulatory Visit: Payer: HMO | Admitting: Cardiovascular Disease

## 2021-01-06 DIAGNOSIS — I10 Essential (primary) hypertension: Secondary | ICD-10-CM | POA: Diagnosis not present

## 2021-01-06 DIAGNOSIS — E782 Mixed hyperlipidemia: Secondary | ICD-10-CM

## 2021-01-06 DIAGNOSIS — I5020 Unspecified systolic (congestive) heart failure: Secondary | ICD-10-CM | POA: Diagnosis not present

## 2021-01-06 MED ORDER — DOXAZOSIN MESYLATE 4 MG PO TABS: 4.0000 mg | ORAL_TABLET | Freq: Every day | ORAL | 3 refills | Status: DC

## 2021-01-06 NOTE — Patient Instructions (Signed)

## 2021-01-06 NOTE — Assessment & Plan Note (Signed)
History of ischemic cardiomyopathy with right left heart cath performed by Dr. Shirlee Latch 09/08/2020 revealing left main/three-vessel disease.  He was turned down for surgery because of age and comorbidities.  Patient declined high risk intervention because he was otherwise asymptomatic and elected medical therapy.  He is currently on guideline directed optimal medical therapy and feels clinically improved.  His ejection fraction by 2D echo 06/24/2020 was 20 to 25%.  He did have moderately severe pulmonary hypertension as well with mild to moderate mitral regurgitation.

## 2021-01-06 NOTE — Assessment & Plan Note (Signed)
History of essential hypertension a blood pressure measured today at 156/94.  He did take his blood pressure at home this morning which was 136/72.  He is on carvedilol and Entresto as well as spironolactone.

## 2021-01-06 NOTE — Assessment & Plan Note (Signed)
History of carotid artery disease status post uncomplicated elective left carotid endarterectomy performed by Dr. Darrick Penna 05/08/2013.  They follow Doppler studies in their office.

## 2021-01-06 NOTE — Progress Notes (Signed)
01/06/2021 David Sellers   1940/07/17  [id number]  Primary Physician David Reichmann, DO Primary Cardiologist: David Gess MD David Sellers, [place]  HPI:  David Sellers is a 80 y.o.    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 Fabienne Sellers for cardiovascular evaluation and preoperative screening prior to elective left carotid endarterectomy.  He is accompanied by his wife David Sellers today.  I last saw him in the office 10/07/2020.Marland Kitchen His cardiovascular factor profile is positive for hypertension, dyslipidemia, and family history of heart disease with a father who died of a myocardial infarction in his 4s. He has never had a heart attack or stroke. He denies chest pain or shortness of breath.  He did have pain in his left neck and had incidentally found moderate left internal carotid artery stenosis. A Myoview stress test performed for preoperative clearance 04/23/13 was entirely normal. He underwent uncomplicated left carotid endarterectomy by Dr. Darrick Sellers on 05/08/13 with excellent operative result although the patient does have some left facial numbness as result. SinceI saw him a year ago he has remained stable.   He is been under a lot of stress lately with his wife having kidney infection him having upper respiratory tract infection although he checks his blood pressure at home and it runs in the 135/70 range.     He has been diagnosed with diabetes and is seeing an endocrinologist.  He is placed on a diet is lost 15 pounds.    He has taken 3 Covid shots and developed pneumonia after these.  He has had a chronic cough for months now followed by pulmonologist.  A chest CT performed 03/28/2020 showed small bilateral pleural effusions, three-vessel coronary atherosclerosis with widespread moderate patchy regions of groundglass opacity and consolidation involving all lung lobes.  A 2D echocardiogram performed 06/24/2020 revealed severe  LV dysfunction with an EF in the 20 to 25% range.  I did refer him to Dr. Shirlee Sellers  who ultimately performed right left heart cath on him 09/08/2020 revealing left main/three-vessel disease with low filling pressures.  He was evaluated for CABG and PCI and he decided only to pursue medical therapy.  He is completely asymptomatic.  Does have a right inguinal hernia which he is symptomatic from and wishes to have this surgically corrected by Dr. Gerrit Sellers.  He is at high risk for this given his LV dysfunction and unrevascularized three-vessel disease.  Since I saw him 3 months ago he continues to do well.  He still practicing as a Education officer, environmental.  He is on guideline directed optimal medical therapy.  Since stopping lovastatin his arthritic pain in his neck and shoulders has resolved.  He is on Repatha with an excellent result and his lipid profile.  He denies chest pain or shortness of breath.    Current Meds  Medication Sig   Acetaminophen (TYLENOL ARTHRITIS PAIN PO) Take by mouth as needed.   aspirin EC 81 MG tablet Take 81 mg by mouth every morning.   carvedilol (COREG) 6.25 MG tablet Take 1 tablet each morning and 2 tablets each night.   clopidogrel (PLAVIX) 75 MG tablet Take 1 tablet (75 mg total) by mouth daily.   doxazosin (CARDURA) 4 MG tablet 1 tablet   Evolocumab (REPATHA SURECLICK) 140 MG/ML SOAJ Inject 140 mg into the skin every 14 (fourteen) days.   Krill Oil 500 MG CAPS Take 500 mg by mouth at bedtime.  loratadine (CLARITIN) 10 MG tablet 1 tablet   metFORMIN (GLUCOPHAGE-XR) 500 MG 24 hr tablet Take 1 tablet (500 mg total) by mouth 2 (two) times daily with breakfast and lunch.   ONETOUCH VERIO test strip 2 (two) times daily.   pantoprazole (PROTONIX) 40 MG tablet TAKE 1 TABLET BY MOUTH EVERY DAY   sacubitril-valsartan (ENTRESTO) 49-51 MG Take 1 tablet by mouth 2 (two) times daily.   spironolactone (ALDACTONE) 25 MG tablet Take 1 tablet (25 mg total) by mouth daily.     Allergies  Allergen  Reactions   Albuterol Anaphylaxis    Per patient and daughter   Meloxicam Itching, Palpitations and Other (See Comments)    Headaches, Mood swings.   Morphine And Related Nausea And Vomiting   Statins Other (See Comments)    Myalgias. Severe arthritic response. Tolerates Lovastatin.    Cholestoff [Plant Sterols And Stanols] Other (See Comments)    Muscle aches   Nsaids Other (See Comments)    Aleve ,  Myalgia   Atorvastatin     Severe myalgias   Breo Ellipta [Fluticasone Furoate-Vilanterol]     thrush   Cortisone Hypertension    Patient states cortisone injections he has received for his knees cause hypertension and hyperglycemia   Cortizone-10 Feminine Itch [Hydrocortisone]     "Feeling of heart attack"   Darvon [Propoxyphene] Other (See Comments)    hallucinate   Doxycycline Cough   Emetrol     Other reaction(s): Hallucinate   Glimepiride Itching   Hydrochlorothiazide     Other reaction(s): Low Blood Pressure   Indomethacin     Neck pain flare   Jardiance [Empagliflozin]     hallucinations   Naproxen     Other reaction(s): Increased Liver Count   Penicillins     *positive allergy test*   Pravastatin     myalgias   Simvastatin     myalgias   Sulfa Antibiotics Nausea And Vomiting   Terbinafine Diarrhea   Tramadol Other (See Comments)    Hallucinations     Social History   Socioeconomic History   Marital status: Married    Spouse name: Not on file   Number of children: Not on file   Years of education: Not on file   Highest education level: Not on file  Occupational History   Not on file  Tobacco Use   Smoking status: Never   Smokeless tobacco: Never  Vaping Use   Vaping Use: Never used  Substance and Sexual Activity   Alcohol use: No   Drug use: No   Sexual activity: Yes  Other Topics Concern   Not on file  Social History Narrative   Not on file   Social Determinants of Health   Financial Resource Strain: Not on file  Food Insecurity: Not on  file  Transportation Needs: Not on file  Physical Activity: Not on file  Stress: Not on file  Social Connections: Not on file  Intimate Partner Violence: Not on file     Review of Systems: General: negative for chills, fever, night sweats or weight changes.  Cardiovascular: negative for chest pain, dyspnea on exertion, edema, orthopnea, palpitations, paroxysmal nocturnal dyspnea or shortness of breath Dermatological: negative for rash Respiratory: negative for cough or wheezing Urologic: negative for hematuria Abdominal: negative for nausea, vomiting, diarrhea, bright red blood per rectum, melena, or hematemesis Neurologic: negative for visual changes, syncope, or dizziness All other systems reviewed and are otherwise negative except as noted above.  Blood pressure (!) 156/94, pulse 73, height 5\' 6"  (1.676 m), weight 135 lb 9.6 oz (61.5 kg), SpO2 98 %.  General appearance: alert and no distress Neck: no adenopathy, no JVD, supple, symmetrical, trachea midline, thyroid not enlarged, symmetric, no tenderness/mass/nodules, and soft right carotid bruit Lungs: clear to auscultation bilaterally Heart: regular rate and rhythm, S1, S2 normal, no murmur, click, rub or gallop Extremities: extremities normal, atraumatic, no cyanosis or edema Pulses: 2+ and symmetric Skin: Skin color, texture, turgor normal. No rashes or lesions Neurologic: Grossly normal  EKG not performed today  ASSESSMENT AND PLAN:   Occlusion and stenosis of carotid artery without mention of cerebral infarction History of carotid artery disease status post uncomplicated elective left carotid endarterectomy performed by Dr. Oneida Alar 05/08/2013.  They follow Doppler studies in their office.  Essential hypertension History of essential hypertension a blood pressure measured today at 156/94.  He did take his blood pressure at home this morning which was 136/72.  He is on carvedilol and Entresto as well as  spironolactone.  Hyperlipidemia History of hyperlipidemia currently on Repatha with recent lipid profile performed 12/28/2020 revealing total cholesterol 124, LDL 44 and HDL 49.  HFrEF (heart failure with reduced ejection fraction) (HCC) History of ischemic cardiomyopathy with right left heart cath performed by Dr. Aundra Dubin 09/08/2020 revealing left main/three-vessel disease.  He was turned down for surgery because of age and comorbidities.  Patient declined high risk intervention because he was otherwise asymptomatic and elected medical therapy.  He is currently on guideline directed optimal medical therapy and feels clinically improved.  His ejection fraction by 2D echo 06/24/2020 was 20 to 25%.  He did have moderately severe pulmonary hypertension as well with mild to moderate mitral regurgitation.     Lorretta Harp MD FACP,FACC,FAHA, [facility] 01/06/2021 12:11 PM

## 2021-01-06 NOTE — Assessment & Plan Note (Signed)
History of hyperlipidemia currently on Repatha with recent lipid profile performed 12/28/2020 revealing total cholesterol 124, LDL 44 and HDL 49.

## 2021-01-11 ENCOUNTER — Telehealth: Payer: Self-pay

## 2021-01-11 NOTE — Telephone Encounter (Signed)
Called and lmomed the pt that we received their application for the repatha pt assistance. I stated that they would probably more qualify for the healthwell foundation and that if they were interested that I would be happy to apply for them but I would need them to call me back to proceed and that I will fill out this application as well. Pt called back as I was typing this msg and we were able to get the healthwell grant approved and emailed entresto paperwork to be turned into dr berry for cost assistance. Pt voiced understanding.

## 2021-01-14 ENCOUNTER — Telehealth: Payer: Self-pay

## 2021-01-14 ENCOUNTER — Other Ambulatory Visit: Payer: Self-pay

## 2021-01-14 MED ORDER — SACUBITRIL-VALSARTAN 49-51 MG PO TABS: 1.0000 | ORAL_TABLET | Freq: Two times a day (BID) | ORAL | 3 refills | Status: DC

## 2021-01-14 NOTE — Telephone Encounter (Signed)
[employer] pt assistance form (entresto) signed by Dr. Berry and faxed with successful fax confirmation.  

## 2021-01-19 ENCOUNTER — Telehealth: Payer: Self-pay

## 2021-01-19 NOTE — Telephone Encounter (Signed)
Patient's blood sugar was averaging in 140s-150s.  This week has averaged 179.  Has had occasional readings >200 which is very odd for him.    Will discontinue at this time and see if patient can be approved for Praluent.  Please complete prior authorization for:  Name of medication, dose, and frequency praluent 75mg  sq q 14 days  Lab Orders Requested? no  Which labs? N/a  Estimated date for labs to be scheduled n/a  Does patient need activated copay card? N/a

## 2021-01-19 NOTE — Telephone Encounter (Signed)
Pt reports elevated glucose levels while on repatha and would like to know what to do. Will route to pharmd pool. They also stated that his sclera of his right eye is purple.

## 2021-01-19 NOTE — Telephone Encounter (Signed)
Pt returned call but left msg will route back to dr. Delma Officer so he can call back

## 2021-01-19 NOTE — Telephone Encounter (Signed)
LMOM

## 2021-01-20 ENCOUNTER — Other Ambulatory Visit: Payer: Self-pay | Admitting: Cardiovascular Disease

## 2021-01-20 DIAGNOSIS — H1131 Conjunctival hemorrhage, right eye: Secondary | ICD-10-CM | POA: Diagnosis not present

## 2021-01-20 MED ORDER — PRALUENT 75 MG/ML ~~LOC~~ SOAJ: 75.0000 mg | SUBCUTANEOUS | 11 refills | Status: DC

## 2021-01-20 NOTE — Telephone Encounter (Signed)
Attempted to do pa for praluent 75 but kept getting a claims failed test on cover my meds so I sent an rx for praluent and will call the pharmacy when they open up at 9am to check the price and see if insurance was able to process and to see if they added in the healthwell grant. Once I get that information I will then call the pt and inform him of the changes.

## 2021-01-20 NOTE — Telephone Encounter (Signed)
Called the pharmacy and they stated that it needed a pa but cmm wasn't responding so I am doing it on paper and faxing to [employer]. Called and spoke with pt and informed them pt voiced understanding.

## 2021-01-21 NOTE — Telephone Encounter (Signed)
Called and spoke w/pt and stated that they were approved for the praluent and that the same healthwell grant should still apply and their medication should be free to them and they voiced gratitude and understanding

## 2021-01-25 ENCOUNTER — Telehealth (HOSPITAL_COMMUNITY): Payer: Self-pay | Admitting: *Deleted

## 2021-01-25 NOTE — Telephone Encounter (Signed)
Pt will weigh and have vitals before e-visit.

## 2021-01-25 NOTE — Telephone Encounter (Signed)
Pt left vm stating he has flu like symptoms but has an office visit here Wednesday 12/21 he asked if it can be virtual.   Routed to [place], [place]

## 2021-01-27 ENCOUNTER — Encounter (HOSPITAL_COMMUNITY): Payer: Self-pay

## 2021-01-27 ENCOUNTER — Telehealth: Payer: Self-pay

## 2021-01-27 ENCOUNTER — Other Ambulatory Visit: Payer: Self-pay

## 2021-01-27 ENCOUNTER — Encounter (HOSPITAL_COMMUNITY): Payer: Self-pay | Admitting: Cardiology

## 2021-01-27 ENCOUNTER — Ambulatory Visit (HOSPITAL_COMMUNITY)
Admission: RE | Admit: 2021-01-27 | Discharge: 2021-01-27 | Disposition: A | Discharge status: Home / Self Care | Payer: HMO | Source: Ambulatory Visit | Attending: Family Medicine | Admitting: Family Medicine

## 2021-01-27 VITALS — BP 119/62 | HR 71 | Wt 134.4 lb

## 2021-01-27 DIAGNOSIS — I5022 Chronic systolic (congestive) heart failure: Secondary | ICD-10-CM | POA: Diagnosis not present

## 2021-01-27 DIAGNOSIS — I10 Essential (primary) hypertension: Secondary | ICD-10-CM

## 2021-01-27 DIAGNOSIS — R0683 Snoring: Secondary | ICD-10-CM

## 2021-01-27 DIAGNOSIS — I2510 Atherosclerotic heart disease of native coronary artery without angina pectoris: Secondary | ICD-10-CM | POA: Diagnosis not present

## 2021-01-27 DIAGNOSIS — E782 Mixed hyperlipidemia: Secondary | ICD-10-CM

## 2021-01-27 DIAGNOSIS — E119 Type 2 diabetes mellitus without complications: Secondary | ICD-10-CM

## 2021-01-27 DIAGNOSIS — I6523 Occlusion and stenosis of bilateral carotid arteries: Secondary | ICD-10-CM

## 2021-01-27 NOTE — Patient Instructions (Addendum)
After visit summary was sent via mychart message  It was a pleasure to speak with you today.  No medication changes are needed at this time.  Labs needed in 10-14 days -an order was placed in the system ok to have drawn at [facility]  Your physician recommends that you schedule a follow-up appointment in: 3 months with Dr Shirlee Latch -Friday March 24,2023 @ 1120am Parking code 1102   Do the following things EVERYDAY: Weigh yourself in the morning before breakfast. Write it down and keep it in a log. Take your medicines as prescribed Eat low salt foods--Limit salt (sodium) to 2000 mg per day.  Stay as active as you can everyday Limit all fluids for the day to less than 2 liters  At the [facility], you and your health needs are our priority. As part of our continuing mission to provide you with exceptional heart care, we have created designated Provider Care Teams. These Care Teams include your primary Cardiologist (physician) and Advanced Practice Providers (APPs- Physician Assistants and Nurse Practitioners) who all work together to provide you with the care you need, when you need it.   You may see any of the following providers on your designated Care Team at your next follow up: Dr Arvilla Meres Dr Carron Curie, NP Robbie Lis, [place] [facility] [place], [place] Karle Plumber, PharmD   Please be sure to bring in all your medications bottles to every appointment.

## 2021-01-27 NOTE — Telephone Encounter (Signed)
Pt called to report that they were approved for pt assistance for entresto and they expressed sincere gratitude. I will pass it along to the rn

## 2021-01-27 NOTE — Progress Notes (Signed)
Heart Failure TeleHealth Note  Due to national recommendations of social distancing due to COVID 19, Audio/video telehealth visit is felt to be most appropriate for this patient at this time.  See MyChart message from today for patient consent regarding telehealth for [facility].  Date:  01/27/2021   ID:  David Sellers, DOB October 15, 1940, MRN [id number]  Location: Home  Provider location: [place] [facility] Type of Visit: Established patient   PCP:  Irena Reichmann, DO  Cardiologist:  Nanetta Batty, MD Primary HF: Dr. Shirlee Latch  Chief Complaint:  Phone visit for CHF follow up   History of Present Illness: David Sellers is a 80 y.o. male with history of cardiomyopathy of uncertain etiology, carotid stenosis, HTN, type 2 diabetes, and CAD was referred by Dr. Allyson Sabal for evaluation of CHF.  Patient had left CEA in 4/15.  Cardiolite in 3/15 prior to CEA was normal.  He generally did well until 12/21.  Starting at that time, he had 3 episodes of "pneumonia." In 2/22, due to respiratory symptoms, he had a CT chest.  This showed 3 vessel coronary calcification and ground glass opacities concerning for pulmonary edema. He saw a pulmonologist, and was thought to have pulmonary edema as a cause of his dyspnea (not a primary pulmonary problem).  He does not smoke.  Echo was done in 5/22 due to ongoing dyspnea and fatigue, showing EF 25% with normal RV, PASP 86 mmHg.  Cardiolite was then done in 6/22, showing EF 25% with evidence for prior inferior/inferolateral MI with peri-infarct ischemia.    Underwent [facility] 09/04/20 showing severe coronary disease with critical RCA stenosis, occluded ostial LCX, 80% ostial L main disease, and preserved cardiac output. He was admitted post cath and CT surgery consulted. Patient declined surgery after discussion of his risk. Cardiac MRI showed substantial viability, but with patient having minimal symptoms, decision made to manage medically (he decided  against high risk PCI RCA and left main).  Plavix was added at discharge.   Patient had right inguinal hernia repair in 10/22 with no complications.   Today he presents for audio/video visit for HF follow up due to being COVID +. Fortunately he has mild symptoms. Continues to walk in the house and up the stairs without significant dyspnea. Overall feeling fine. Denies CP, dizziness, edema, or PND/Orthopnea. Appetite ok. No fever or chills. Weight at home stable. Has not had to take PRN Lasix. Taking all medications. Facial eczema better now that he is using steroid cream.  Labs (4/22): K 3.7, creatinine 1.0 Labs (8/22): K 4.6, creatinine 0.89, LDL 93 Labs (9/22): K 4.4, creatinine 0.90 Labs (10/22): K 4.7, creatinine 1.08   PMH:  1. HTN 2. BPH 3. Carotid stenosis: Left CEA in 4/15.  4. Type 2 diabetes 5. Chronic systolic CHF: Ischemic cardiomyopathy.  - Cardiolite in 3/15 was normal - Echo (5/22): EF 20-25%, normal RV, mild-moderate MR, PASP 86 mmHg.  - Cardiolite (6/22): EF 25%, large primarily fixed inferior/inferolateral defect => suspect prior MI with peri-infarct ischemia.  - RHC (09/04/20): Low filling pressures, preserved CO. RA 1, PA 19/8 PCWP 1, PA 76%, CO 4.8, CI 2.8  6. Hyperlipidemia: He has not tolerated any statin other than lovastatin.  7. CAD: severe on cath (7/22) with critical 95% mRCA stenosis, occluded ostial LCx, 80% ostial left main disease, 95% D1, 60% mLAD.  - cMRI (7/22): EF 25-50%, normal RV,  substantial cardiac viability (nontransmural scar only in the basal-mid inferolateral  wall).  - Patient refused CABG and declined high risk PCI.  8. Right inguinal hernia repair in 10/22.   He presents via Web designer for a telehealth visit today.     Past Medical History:  Diagnosis Date   Arthritis    Asthma    only has flareup with smelly perfume    BPH (benign prostatic hyperplasia)    Carotid artery occlusion    right ICA 50% stenosis   Carotodynia     Cataract    immature to the right eye   Diabetes mellitus without complication (HCC)    borderline   GERD (gastroesophageal reflux disease)    takes Omeprazole daily   Headache(784.0)    r/t arthritis in neck    Hemorrhoids    History of bronchitis    last time 10-66yrs ago   History of colon polyps    Hyperlipidemia    no meds since Jan 1   Hypertension    takes Verapamil,Lisinopril,and Cardura daily   Joint pain    Legally blind in left eye, as defined in [place]    hit by a baseball   Low back pain    reason unknown   Pneumonia    3 times in a row   Prolapsed hemorrhoids    Urinary frequency    sees Dr.Tannenbaum   Past Surgical History:  Procedure Laterality Date   CAROTID ENDARTERECTOMY Left 05/08/13   COLONOSCOPY     EGD with dilitation     ENDARTERECTOMY Left 05/08/2013   Procedure: LEFT CAROTID ARTERY ENDARTERECTOMY WITH DACRON PATCH ANGIOPLASTY;  Surgeon: Sherren Kerns, MD;  Location: [facility] OR;  Service: Vascular;  Laterality: Left;   EYE SURGERY     HEMORRHOID SURGERY N/A 08/13/2019   Procedure: OPEN HEMORRHOIDECTOMY;  Surgeon: Darnell Level, MD;  Location: [facility];  Service: General;  Laterality: N/A;  LMA   HERNIA REPAIR     x 2   INGUINAL HERNIA REPAIR Right 11/09/2020   Procedure: OPEN REPAIR RIGHT INGUINAL HERNIA;  Surgeon: Darnell Level, MD;  Location: [facility] OR;  Service: General;  Laterality: Right;   INSERTION OF MESH Right 11/09/2020   Procedure: INSERTION OF MESH;  Surgeon: Darnell Level, MD;  Location: [facility] OR;  Service: General;  Laterality: Right;   RETINAL DETACHMENT SURGERY     retinal laser surgery Left    plate placed   RIGHT/LEFT HEART CATH AND CORONARY ANGIOGRAPHY N/A 09/08/2020   Procedure: RIGHT/LEFT HEART CATH AND CORONARY ANGIOGRAPHY;  Surgeon: Laurey Morale, MD;  Location: [facility] INVASIVE CV [facility];  Service: Cardiovascular;  Laterality: N/A;   Current Outpatient Medications  Medication Sig Dispense Refill   Acetaminophen (TYLENOL  ARTHRITIS PAIN PO) Take by mouth as needed.     Alirocumab (PRALUENT) 75 MG/ML SOAJ Inject 75 mg into the skin every 14 (fourteen) days. 2 mL 11   aspirin EC 81 MG tablet Take 81 mg by mouth every morning.     carvedilol (COREG) 6.25 MG tablet Take 1 tablet each morning and 2 tablets each night. 270 tablet 3   cetirizine (ZYRTEC) 10 MG tablet Take 10 mg by mouth at bedtime.     clopidogrel (PLAVIX) 75 MG tablet Take 1 tablet (75 mg total) by mouth daily. 30 tablet 11   doxazosin (CARDURA) 4 MG tablet Take 1 tablet (4 mg total) by mouth daily. 90 tablet 3   Krill Oil 500 MG CAPS Take 500 mg by mouth at bedtime.  loratadine (CLARITIN) 10 MG tablet Take 10 mg by mouth daily.     metFORMIN (GLUCOPHAGE-XR) 500 MG 24 hr tablet Take 1 tablet (500 mg total) by mouth 2 (two) times daily with breakfast and lunch.     ONETOUCH VERIO test strip 2 (two) times daily.     pantoprazole (PROTONIX) 40 MG tablet TAKE 1 TABLET BY MOUTH EVERY DAY 90 tablet 1   sacubitril-valsartan (ENTRESTO) 49-51 MG Take 1 tablet by mouth 2 (two) times daily. 180 tablet 3   spironolactone (ALDACTONE) 25 MG tablet Take 1 tablet (25 mg total) by mouth daily. 90 tablet 3   No current facility-administered medications for this encounter.   Allergies:   Albuterol, Meloxicam, Morphine and related, Statins, Cholestoff [plant sterols and stanols], Nsaids, Atorvastatin, Breo ellipta [fluticasone furoate-vilanterol], Cortisone, Cortizone-10 feminine itch [hydrocortisone], Darvon [propoxyphene], Doxycycline, Emetrol, Glimepiride, Hydrochlorothiazide, Indomethacin, Jardiance [empagliflozin], Naproxen, Penicillins, Pravastatin, Repatha [evolocumab], Simvastatin, Sulfa antibiotics, Terbinafine, and Tramadol   Social History:  The patient  reports that he has never smoked. He has never used smokeless tobacco. He reports that he does not drink alcohol and does not use drugs.   Family History:  The patient's family history includes Diabetes in  his daughter and daughter; Heart attack (age of onset: 62) in his father; Heart disease in his father; Hyperlipidemia in his daughter, daughter, and father; Hypertension in his daughter, daughter, and father; Kidney disease in his mother. He was adopted.   ROS:  Please see the history of present illness.   All other systems are personally reviewed and negative.   BP 119/62    Pulse 71    Wt 61 kg (134 lb 6.4 oz)    SpO2 97%    BMI 21.69 kg/m   Exam:  (Video/Tele Health Call; Exam is subjective and or/visual.) General:  Speaks in full sentences. No resp difficulty. Lungs: Normal respiratory effort with conversation.  Abdomen: Non-distended per patient report. Extremities: Pt denies edema. Neuro: Alert & oriented x 3.   Recent Labs: 09/15/2020: B Natriuretic Peptide 231.3 11/03/2020: Hemoglobin 14.0; Platelets 190 11/26/2020: BUN 13; Creatinine, Ser 1.08; Potassium 4.7; Sodium 141  Personally reviewed   Wt Readings from Last 3 Encounters:  01/27/21 61 kg (134 lb 6.4 oz)  01/06/21 61.5 kg (135 lb 9.6 oz)  12/16/20 62.6 kg (138 lb)    ASSESSMENT AND PLAN:  1. CAD: Cath 8/22 with severe coronary disease with critical RCA stenosis, occluded ostial LCX, 80% ostial L main disease, and preserved cardiac output. CT surgery consulted for possible CABG and feels he would be at least moderate risk due to his low EF, age, and other comorbidities. After weighing options, pt declined surgery. Intervention on RCA and left main thought to be feasible and his cardiac MRI showed substantial viability, but he also declined high risk RCA + LM PCI.  He has had no anginal symptoms.  - Continue ASA 81 mg + Plavix + beta blocker. - Now off lovastatin 2/2 joint pain. Intolerant of all other statins. - Now on Praluent, LDL down from 93-->44 on 11/22. 2.  Chronic systolic CHF: Ischemic cardiomyopathy.  Echo in 5/22 showed EF 20-25% with normal RV, interestingly, the PA systolic pressure estimation was severely  elevated at 86 mmHg. RHC 8/22 with low filling pressure, preserved cardiac output, actually no pulmonary hypertension.  Stable NYHA II, no worrisome symptoms today for increased volume. - Did not tolerate Jardiance (hallucinations), will not try [place] as he has many medication intolerances/allergies. - Continue Sherryll Burger  49/51 mg bid. - Continue Coreg 6.25 mg q am/12.5 mg q pm.  - Continue spironolactone 25 mg daily.  Needs repeat BMET soon. - Does not need Lasix at this time. 3. Carotid stenosis: S/p left CEA.   - Carotid dopplers followed by [facility]. - Now on Praulent. - Continue ASA 81 daily. 4. HTN: Controlled on current regimen.  5. Snoring/fatigue: Sleep study negative for OSA. 6. Type 2DM: Off Jardiance with hallucinations.   Follow up 2-3 months with Dr. Shirlee Latch   COVID screen Patient is COVID+.  Social distancing reinforced today. His quarantine is over 01/30/21.  Patient Risk: After full review of this patients clinical status, I feel that they are at low risk for cardiac decompensation at this time.  Relevant cardiac medications were reviewed at length with the patient today. The patient does not have concerns regarding their medications at this time.   The following changes were made today:  No medication changes. Needs BMET in the next week or so. This can be done in [place].  Recommended follow-up:  2-3 months with Dr. Shirlee Latch  Today, I have spent 11 minutes with the patient with telehealth technology discussing the above issues .    Cydney Ok, FNP  01/27/2021 11:09 AM  [facility] [address] Heart and Vascular [place] [address] [phone] (office) [phone] (fax)

## 2021-02-09 DIAGNOSIS — I5020 Unspecified systolic (congestive) heart failure: Secondary | ICD-10-CM | POA: Diagnosis not present

## 2021-02-09 LAB — BASIC METABOLIC PANEL
BUN/Creatinine Ratio: 15 (ref 10–24)
BUN: 14 mg/dL (ref 8–27)
CO2: 25 mmol/L (ref 20–29)
Calcium: 9.2 mg/dL (ref 8.6–10.2)
Chloride: 102 mmol/L (ref 96–106)
Creatinine, Ser: 0.91 mg/dL (ref 0.76–1.27)
Glucose: 185 mg/dL — ABNORMAL HIGH (ref 70–99)
Potassium: 4.4 mmol/L (ref 3.5–5.2)
Sodium: 140 mmol/L (ref 134–144)
eGFR: 85 mL/min/{1.73_m2} (ref >59)

## 2021-02-22 DIAGNOSIS — L820 Inflamed seborrheic keratosis: Secondary | ICD-10-CM | POA: Diagnosis not present

## 2021-03-01 DIAGNOSIS — E1165 Type 2 diabetes mellitus with hyperglycemia: Secondary | ICD-10-CM | POA: Diagnosis not present

## 2021-03-05 DIAGNOSIS — I5022 Chronic systolic (congestive) heart failure: Secondary | ICD-10-CM | POA: Diagnosis not present

## 2021-03-05 DIAGNOSIS — E538 Deficiency of other specified B group vitamins: Secondary | ICD-10-CM | POA: Diagnosis not present

## 2021-03-05 DIAGNOSIS — Z6824 Body mass index (BMI) 24.0-24.9, adult: Secondary | ICD-10-CM | POA: Diagnosis not present

## 2021-03-05 DIAGNOSIS — E1165 Type 2 diabetes mellitus with hyperglycemia: Secondary | ICD-10-CM | POA: Diagnosis not present

## 2021-03-05 DIAGNOSIS — I10 Essential (primary) hypertension: Secondary | ICD-10-CM | POA: Diagnosis not present

## 2021-03-05 DIAGNOSIS — E785 Hyperlipidemia, unspecified: Secondary | ICD-10-CM | POA: Diagnosis not present

## 2021-03-18 DIAGNOSIS — K219 Gastro-esophageal reflux disease without esophagitis: Secondary | ICD-10-CM | POA: Diagnosis not present

## 2021-03-18 DIAGNOSIS — I5022 Chronic systolic (congestive) heart failure: Secondary | ICD-10-CM | POA: Diagnosis not present

## 2021-03-18 DIAGNOSIS — E1159 Type 2 diabetes mellitus with other circulatory complications: Secondary | ICD-10-CM | POA: Diagnosis not present

## 2021-03-18 DIAGNOSIS — Z0000 Encounter for general adult medical examination without abnormal findings: Secondary | ICD-10-CM | POA: Diagnosis not present

## 2021-03-18 DIAGNOSIS — I429 Cardiomyopathy, unspecified: Secondary | ICD-10-CM | POA: Diagnosis not present

## 2021-03-18 DIAGNOSIS — Z9889 Other specified postprocedural states: Secondary | ICD-10-CM | POA: Diagnosis not present

## 2021-03-18 DIAGNOSIS — N400 Enlarged prostate without lower urinary tract symptoms: Secondary | ICD-10-CM | POA: Diagnosis not present

## 2021-03-18 DIAGNOSIS — E785 Hyperlipidemia, unspecified: Secondary | ICD-10-CM | POA: Diagnosis not present

## 2021-03-19 DIAGNOSIS — H524 Presbyopia: Secondary | ICD-10-CM | POA: Diagnosis not present

## 2021-03-19 DIAGNOSIS — H2511 Age-related nuclear cataract, right eye: Secondary | ICD-10-CM | POA: Diagnosis not present

## 2021-03-19 DIAGNOSIS — E119 Type 2 diabetes mellitus without complications: Secondary | ICD-10-CM | POA: Diagnosis not present

## 2021-03-19 DIAGNOSIS — H18422 Band keratopathy, left eye: Secondary | ICD-10-CM | POA: Diagnosis not present

## 2021-04-12 DIAGNOSIS — N433 Hydrocele, unspecified: Secondary | ICD-10-CM | POA: Diagnosis not present

## 2021-04-30 ENCOUNTER — Encounter (HOSPITAL_COMMUNITY): Payer: Self-pay | Admitting: Cardiology

## 2021-04-30 ENCOUNTER — Other Ambulatory Visit: Payer: Self-pay

## 2021-04-30 ENCOUNTER — Ambulatory Visit (HOSPITAL_COMMUNITY)
Admission: RE | Admit: 2021-04-30 | Discharge: 2021-04-30 | Disposition: A | Discharge status: Home / Self Care | Payer: HMO | Source: Ambulatory Visit | Attending: Cardiology | Admitting: Cardiology

## 2021-04-30 VITALS — BP 148/80 | HR 64 | Wt 140.6 lb

## 2021-04-30 DIAGNOSIS — I2510 Atherosclerotic heart disease of native coronary artery without angina pectoris: Secondary | ICD-10-CM | POA: Insufficient documentation

## 2021-04-30 DIAGNOSIS — I5020 Unspecified systolic (congestive) heart failure: Secondary | ICD-10-CM

## 2021-04-30 DIAGNOSIS — I255 Ischemic cardiomyopathy: Secondary | ICD-10-CM | POA: Insufficient documentation

## 2021-04-30 DIAGNOSIS — Z7982 Long term (current) use of aspirin: Secondary | ICD-10-CM | POA: Insufficient documentation

## 2021-04-30 DIAGNOSIS — I5022 Chronic systolic (congestive) heart failure: Secondary | ICD-10-CM

## 2021-04-30 DIAGNOSIS — I6529 Occlusion and stenosis of unspecified carotid artery: Secondary | ICD-10-CM | POA: Diagnosis not present

## 2021-04-30 DIAGNOSIS — R443 Hallucinations, unspecified: Secondary | ICD-10-CM | POA: Insufficient documentation

## 2021-04-30 DIAGNOSIS — I110 Hypertensive heart disease with heart failure: Secondary | ICD-10-CM | POA: Diagnosis not present

## 2021-04-30 DIAGNOSIS — E119 Type 2 diabetes mellitus without complications: Secondary | ICD-10-CM | POA: Insufficient documentation

## 2021-04-30 DIAGNOSIS — R0683 Snoring: Secondary | ICD-10-CM | POA: Insufficient documentation

## 2021-04-30 DIAGNOSIS — Z7902 Long term (current) use of antithrombotics/antiplatelets: Secondary | ICD-10-CM | POA: Diagnosis not present

## 2021-04-30 LAB — BASIC METABOLIC PANEL
Anion gap: 8 (ref 5–15)
BUN: 10 mg/dL (ref 8–23)
CO2: 27 mmol/L (ref 22–32)
Calcium: 9.4 mg/dL (ref 8.9–10.3)
Chloride: 106 mmol/L (ref 98–111)
Creatinine, Ser: 0.92 mg/dL (ref 0.61–1.24)
GFR, Estimated: >60 mL/min (ref >60)
Glucose, Bld: 163 mg/dL — ABNORMAL HIGH (ref 70–99)
Potassium: 4 mmol/L (ref 3.5–5.1)
Sodium: 141 mmol/L (ref 135–145)

## 2021-04-30 MED ORDER — ENTRESTO 97-103 MG PO TABS: 1.0000 | ORAL_TABLET | Freq: Two times a day (BID) | ORAL | 3 refills | Status: DC

## 2021-04-30 NOTE — Patient Instructions (Signed)
Medication Changes: ? ?Increase Entresto to 97/103 ? ?Lab Work: ? ?Labs done today, your results will be available in MyChart, we will contact you for abnormal readings. ? ? ?Testing/Procedures: ? ?Your physician has requested that you have an echocardiogram. Echocardiography is a painless test that uses sound waves to create images of your heart. It provides your doctor with information about the size and shape of your heart and how well your heart?s chambers and valves are working. This procedure takes approximately one hour. There are no restrictions for this procedure. ? ? ?Repeat blood work in 10 days  ? ?Referrals: ? ?none ? ?Special Instructions // Education: ? ?none ? ?Follow-Up in: Call in Mid April to Arrange your follow up and Echocardiogram ? ?At the [facility], you and your health needs are our priority. We have a designated team specialized in the treatment of Heart Failure. This Care Team includes your primary Heart Failure Specialized Cardiologist (physician), Advanced Practice Providers (APPs- Physician Assistants and Nurse Practitioners), and Pharmacist who all work together to provide you with the care you need, when you need it.  ? ?You may see any of the following providers on your designated Care Team at your next follow up: ? ?Dr Arvilla Meres ?Dr Marca Ancona ?Tonye Becket, NP ?Robbie Lis, PA ?Jessica Milford,NP ?Anna Genre, PA ?Karle Plumber, PharmD ? ? ?Please be sure to bring in all your medications bottles to every appointment.  ? ?Need to Contact us: ? ?If you have any questions or concerns before your next appointment please send [place] a message through [place] or call our office at [phone].   ? ?TO LEAVE A MESSAGE FOR THE NURSE SELECT OPTION 2, PLEASE LEAVE A MESSAGE INCLUDING: ?YOUR NAME ?DATE OF BIRTH ?CALL BACK NUMBER ?REASON FOR CALL**this is important as we prioritize the call backs ? ?YOU WILL RECEIVE A CALL BACK THE SAME DAY AS LONG AS YOU CALL  BEFORE 4:00 PM ? ? ?

## 2021-05-02 NOTE — Progress Notes (Signed)
PCP: Irena Reichmannollins, Dana, DO ?Cardiology: Dr. Allyson SabalBerry ?HF Cardiology: Dr. Shirlee LatchMcLean ? ?81 y.o. with history of cardiomyopathy of uncertain etiology, carotid stenosis, HTN, type 2 diabetes, and CAD was referred by Dr. Allyson SabalBerry for evaluation of CHF.  Patient had left CEA in 4/15.  Cardiolite in 3/15 prior to CEA was normal.  He generally did well until 12/21.  Starting at that time, he had 3 episodes of "pneumonia."  In 2/22, due to respiratory symptoms, he had a CT chest.  This showed 3 vessel coronary calcification and ground glass opacities concerning for pulmonary edema. He saw a pulmonologist, and was thought to have pulmonary edema as a cause of his dyspnea (not a primary pulmonary problem).  He does not smoke.  Echo was done in 5/22 due to ongoing dyspnea and fatigue, showing EF 25% with normal RV, PASP 86 mmHg.  Cardiolite was then done in 6/22, showing EF 25% with evidence for prior inferior/inferolateral MI with peri-infarct ischemia.  ? ?[facility] showing severe coronary disease with critical RCA stenosis, occluded ostial LCX, 80% ostial L main disease, and preserved cardiac output. He was admitted post cath and CT surgery consulted. Patient declined surgery after discussion of his risk. Cardiac MRI showed substantial viability, but with patient having minimal symptoms, decision made to manage medically (he decided against high risk PCI RCA and left main).  Plavix was added at discharge. ? ?Patient had right inguinal hernia repair in 10/22 with no complications.  ? ?He returns today for followup of CHF.  He continues to do well.  No chest pain.  No dyspnea with usual ADLs, can climb a flight of stairs without problems.  Mild fatigue by the evening.  No lightheadedness, no palpitations.  ? ?ECG (personally reviewed): NSR, LVH with repolarization abnormality.  ? ?Labs (4/22): K 3.7, creatinine 1.0 ?Labs (8/22): K 4.6, creatinine 0.89, LDL 93 ?Labs (9/22): K 4.4, creatinine 0.90 ?Labs (10/22): K 4.7,  creatinine 1.08 ?Labs (11/22): LDL 44 ?Labs (1/23): K 4.4, creatinine 0.91 ? ?PMH:  ?1. HTN ?2. BPH ?3. Carotid stenosis: Left CEA in 4/15.  ?4. Type 2 diabetes ?5. Chronic systolic CHF: Ischemic cardiomyopathy.  ?- Cardiolite in 3/15 was normal ?- Echo (5/22): EF 20-25%, normal RV, mild-moderate MR, PASP 86 mmHg.  ?- Cardiolite (6/22): EF 25%, large primarily fixed inferior/inferolateral defect => suspect prior MI with peri-infarct ischemia.  ?- RHC (09/04/20): Low filling pressures, preserved CO. RA 1, PA 19/8 PCWP 1, PA 76%, CO 4.8, CI 2.8  ?6. Hyperlipidemia: He has not tolerated any statin other than lovastatin.  ?7. CAD: severe on cath (7/22) with critical 95% mRCA stenosis, occluded ostial LCx, 80% ostial left main disease, 95% D1, 60% mLAD.  ?- cMRI (7/22): EF 25-50%, normal RV,  substantial cardiac viability (nontransmural scar only in the basal-mid inferolateral wall).  ?- Patient refused CABG and declined high risk PCI.  ?8. Right inguinal hernia repair in 10/22.  ? ?Social History  ? ?Socioeconomic History  ? Marital status: Married  ?  Spouse name: Not on file  ? Number of children: Not on file  ? Years of education: Not on file  ? Highest education level: Not on file  ?Occupational History  ? Not on file  ?Tobacco Use  ? Smoking status: Never  ? Smokeless tobacco: Never  ?Vaping Use  ? Vaping Use: Never used  ?Substance and Sexual Activity  ? Alcohol use: No  ? Drug use: No  ? Sexual activity: Yes  ?Other Topics  Concern  ? Not on file  ?Social History Narrative  ? Not on file  ? ?Social Determinants of Health  ? ?Financial Resource Strain: Not on file  ?Food Insecurity: Not on file  ?Transportation Needs: Not on file  ?Physical Activity: Not on file  ?Stress: Not on file  ?Social Connections: Not on file  ?Intimate Partner Violence: Not on file  ? ?Family History  ?Adopted: Yes  ?Problem Relation Age of Onset  ? Heart disease Father   ? Heart attack Father 65  ? Hypertension Father   ? Hyperlipidemia  Father   ? Diabetes Daughter   ? Hyperlipidemia Daughter   ? Hypertension Daughter   ? Diabetes Daughter   ? Hyperlipidemia Daughter   ? Hypertension Daughter   ? Kidney disease Mother   ? ?ROS: All systems reviewed and negative except as per HPI.  ? ?Current Outpatient Medications  ?Medication Sig Dispense Refill  ? Acetaminophen (TYLENOL ARTHRITIS PAIN PO) Take by mouth as needed.    ? Alirocumab (PRALUENT) 75 MG/ML SOAJ Inject 75 mg into the skin every 14 (fourteen) days. 2 mL 11  ? aspirin EC 81 MG tablet Take 81 mg by mouth every morning.    ? carvedilol (COREG) 6.25 MG tablet Take 1 tablet each morning and 2 tablets each night. 270 tablet 3  ? cetirizine (ZYRTEC) 10 MG tablet Take 10 mg by mouth at bedtime.    ? clopidogrel (PLAVIX) 75 MG tablet Take 1 tablet (75 mg total) by mouth daily. 30 tablet 11  ? doxazosin (CARDURA) 4 MG tablet Take 1 tablet (4 mg total) by mouth daily. 90 tablet 3  ? Krill Oil 500 MG CAPS Take 500 mg by mouth at bedtime.    ? loratadine (CLARITIN) 10 MG tablet Take 10 mg by mouth daily.    ? metFORMIN (GLUCOPHAGE-XR) 500 MG 24 hr tablet Take 1 tablet (500 mg total) by mouth 2 (two) times daily with breakfast and lunch.    ? ONETOUCH VERIO test strip 2 (two) times daily.    ? pantoprazole (PROTONIX) 40 MG tablet TAKE 1 TABLET BY MOUTH EVERY DAY 90 tablet 1  ? spironolactone (ALDACTONE) 25 MG tablet Take 1 tablet (25 mg total) by mouth daily. 90 tablet 3  ? sacubitril-valsartan (ENTRESTO) 97-103 MG Take 1 tablet by mouth 2 (two) times daily. 60 tablet 3  ? ?No current facility-administered medications for this encounter.  ? ?Wt Readings from Last 3 Encounters:  ?04/30/21 63.8 kg (140 lb 9.6 oz)  ?01/27/21 61 kg (134 lb 6.4 oz)  ?01/06/21 61.5 kg (135 lb 9.6 oz)  ? ?BP (!) 148/80   Pulse 64   Wt 63.8 kg (140 lb 9.6 oz)   SpO2 97%   BMI 22.69 kg/m?  ?General: NAD ?Neck: No JVD, no thyromegaly or thyroid nodule.  ?Lungs: Clear to auscultation bilaterally with normal respiratory  effort. ?CV: Nondisplaced PMI.  Heart regular S1/S2, no S3/S4, no murmur.  No peripheral edema.  No carotid bruit.  Normal pedal pulses.  ?Abdomen: Soft, nontender, no hepatosplenomegaly, no distention.  ?Skin: Intact without lesions or rashes.  ?Neurologic: Alert and oriented x 3.  ?Psych: Normal affect. ?Extremities: No clubbing or cyanosis.  ?HEENT: Normal.  ? ?Assessment/Plan: ?1. CAD: Cath 8/22 with severe coronary disease with critical RCA stenosis, occluded ostial LCX, 80% ostial L main disease, and preserved cardiac output. CT surgery consulted for possible CABG and feels he would be at least moderate risk due to his low  EF, age, and other comorbidities. After weighing options, pt declined surgery. Intervention on RCA and left main thought to be feasible and his cardiac MRI showed substantial viability, but he also declined high risk RCA + LM PCI.  He has had no anginal symptoms.  ?- Continue ASA 81 mg + Plavix + beta blocker. ?- Continue Praluent, LDL improved.   ?2.  Chronic systolic CHF: Ischemic cardiomyopathy.  Echo in 5/22 showed EF 20-25% with normal RV, interestingly, the PA systolic pressure estimation was severely elevated at 86 mmHg. RHC 8/22 with low filling pressure, preserved cardiac output, actually no pulmonary hypertension.  NYHA II, he is not volume overloaded today.   ?- Did not tolerate Jardiance (hallucinations), will not try [place] as he has many medication intolerances/allergies. ?- Increase Entresto to 97/103 bid, BMET today and in 10 days.  ?- Continue Coreg 6.25 mg qam/12.5 mg qpm.  ?- Continue spironolactone 25 mg daily.   ?- Does not need Lasix at this time. ?- Repeat echo at followup in 3 months.  ?3. Carotid stenosis: S/p left CEA.   ?- Carotid dopplers followed by [facility]. ?- Contineu Praluent.  ?- Continue ASA 81 daily. ?4. HTN: Controlled on current regimen.  ?5. Snoring/fatigue: Sleep study negative for OSA. ?6. Type 2DM: Off Jardiance with hallucinations. ? ?Follow up 3 months  with echo ? ?Marca Ancona  ?05/02/2021 ?

## 2021-05-10 ENCOUNTER — Other Ambulatory Visit: Payer: Self-pay

## 2021-05-10 DIAGNOSIS — H9220 Otorrhagia, unspecified ear: Secondary | ICD-10-CM | POA: Diagnosis not present

## 2021-05-10 DIAGNOSIS — I5022 Chronic systolic (congestive) heart failure: Secondary | ICD-10-CM

## 2021-05-11 LAB — BASIC METABOLIC PANEL
BUN/Creatinine Ratio: 13 (ref 10–24)
BUN: 14 mg/dL (ref 8–27)
CO2: 28 mmol/L (ref 20–29)
Calcium: 10.2 mg/dL (ref 8.6–10.2)
Chloride: 103 mmol/L (ref 96–106)
Creatinine, Ser: 1.05 mg/dL (ref 0.76–1.27)
Glucose: 155 mg/dL — ABNORMAL HIGH (ref 70–99)
Potassium: 4.2 mmol/L (ref 3.5–5.2)
Sodium: 141 mmol/L (ref 134–144)
eGFR: 71 mL/min/{1.73_m2} (ref >59)

## 2021-05-18 DIAGNOSIS — Z974 Presence of external hearing-aid: Secondary | ICD-10-CM | POA: Diagnosis not present

## 2021-05-18 DIAGNOSIS — Z7902 Long term (current) use of antithrombotics/antiplatelets: Secondary | ICD-10-CM | POA: Diagnosis not present

## 2021-05-18 DIAGNOSIS — H938X2 Other specified disorders of left ear: Secondary | ICD-10-CM | POA: Diagnosis not present

## 2021-06-03 ENCOUNTER — Ambulatory Visit (HOSPITAL_COMMUNITY)
Admission: RE | Admit: 2021-06-03 | Discharge: 2021-06-03 | Disposition: A | Discharge status: Home / Self Care | Payer: HMO | Source: Ambulatory Visit | Attending: Cardiology | Admitting: Cardiology

## 2021-06-03 DIAGNOSIS — I110 Hypertensive heart disease with heart failure: Secondary | ICD-10-CM | POA: Insufficient documentation

## 2021-06-03 DIAGNOSIS — I2510 Atherosclerotic heart disease of native coronary artery without angina pectoris: Secondary | ICD-10-CM | POA: Diagnosis not present

## 2021-06-03 DIAGNOSIS — I5022 Chronic systolic (congestive) heart failure: Secondary | ICD-10-CM | POA: Diagnosis not present

## 2021-06-03 DIAGNOSIS — E119 Type 2 diabetes mellitus without complications: Secondary | ICD-10-CM | POA: Insufficient documentation

## 2021-06-03 DIAGNOSIS — E785 Hyperlipidemia, unspecified: Secondary | ICD-10-CM | POA: Insufficient documentation

## 2021-06-03 DIAGNOSIS — I5020 Unspecified systolic (congestive) heart failure: Secondary | ICD-10-CM

## 2021-06-03 LAB — ECHOCARDIOGRAM COMPLETE
Area-P 1/2: 3.7 cm2
Calc EF: 38.2 %
S' Lateral: 5.4 cm
Single Plane A2C EF: 36 %
Single Plane A4C EF: 41.6 %

## 2021-06-15 ENCOUNTER — Encounter: Payer: Self-pay | Admitting: Cardiovascular Disease

## 2021-06-15 ENCOUNTER — Ambulatory Visit: Payer: HMO | Admitting: Cardiovascular Disease

## 2021-06-15 VITALS — BP 148/84 | HR 62 | Ht 66.0 in | Wt 141.0 lb

## 2021-06-15 DIAGNOSIS — I5023 Acute on chronic systolic (congestive) heart failure: Secondary | ICD-10-CM

## 2021-06-15 DIAGNOSIS — E782 Mixed hyperlipidemia: Secondary | ICD-10-CM | POA: Diagnosis not present

## 2021-06-15 DIAGNOSIS — I10 Essential (primary) hypertension: Secondary | ICD-10-CM

## 2021-06-15 DIAGNOSIS — I2510 Atherosclerotic heart disease of native coronary artery without angina pectoris: Secondary | ICD-10-CM | POA: Diagnosis not present

## 2021-06-15 DIAGNOSIS — I6523 Occlusion and stenosis of bilateral carotid arteries: Secondary | ICD-10-CM

## 2021-06-15 NOTE — Assessment & Plan Note (Signed)
History of essential hypertension blood pressure measured today at 148/84.  He is on carvedilol and Entresto. ?

## 2021-06-15 NOTE — Assessment & Plan Note (Signed)
History of CAD status post cardiac catheterization performed by Dr. Shirlee Latch 09/08/2020 revealing high-grade tandem proximal and mid RCA stenoses, and occluded circumflex, 80% ostial left main with moderate segmental proximal LAD disease.  He had preserved cardiac output with low filling pressures.  He was seen by cardiothoracic surgery who did not think he was a good surgical candidate.  He is a high risk PCI candidate.  The decision was to treat him medically.  He is totally asymptomatic. ?

## 2021-06-15 NOTE — Progress Notes (Signed)
? ? ? ?06/15/2021 ?David Sellers   ?1940-07-04  ?[id number] ? ?Primary Physician Irena Reichmann, DO ?Primary Cardiologist: Runell Gess MD Nicholes Calamity, [place] ? ?HPI:  David Sellers is a 81 y.o.  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 Fabienne Bruns for cardiovascular evaluation and preoperative screening prior to elective left carotid endarterectomy.  He is accompanied by his wife [name] today.  I last saw him in the office 01/06/2021.Marland Kitchen His cardiovascular factor profile is positive for hypertension, dyslipidemia, and family history of heart disease with a father who died of a myocardial infarction in his 36s. He has never had a heart attack or stroke. He denies chest pain or shortness of breath.  He did have pain in his left neck and had incidentally found moderate left internal carotid artery stenosis. A Myoview stress test performed for preoperative clearance 04/23/13 was entirely normal. He underwent uncomplicated left carotid endarterectomy by Dr. Darrick Penna on 05/08/13 with excellent operative result although the patient does have some left facial numbness as result. SinceI saw him a year ago he has remained stable. ?  ?He is been under a lot of stress lately with his wife having kidney infection him having upper respiratory tract infection although he checks his blood pressure at home and it runs in the 135/70 range.   ?  ?He has been diagnosed with diabetes and is seeing an endocrinologist.  He is placed on a diet is lost 15 pounds.  ?  ?He has taken 3 Covid shots and developed pneumonia after these.  He has had a chronic cough for months now followed by pulmonologist.  A chest CT performed 03/28/2020 showed small bilateral pleural effusions, three-vessel coronary atherosclerosis with widespread moderate patchy regions of groundglass opacity and consolidation involving all lung lobes. ? ?A 2D echocardiogram performed 06/24/2020 revealed severe LV  dysfunction with an EF in the 20 to 25% range.  I did refer him to Dr. Shirlee Latch  who ultimately performed right left heart cath on him 09/08/2020 revealing left main/three-vessel disease with low filling pressures.  He was evaluated for CABG and PCI and he decided only to pursue medical therapy.  He is completely asymptomatic.  Does have a right inguinal hernia which he is symptomatic from and wishes to have this surgically corrected by Dr. Gerrit Friends.  He is at high risk for this given his LV dysfunction and unrevascularized three-vessel disease. ? ?Since I saw him 6 months ago he continues to do well.  He still practicing as a Education officer, environmental.  He is on guideline directed optimal medical therapy.    He is on Repatha with an excellent result and his lipid profile.  He denies chest pain or shortness of breath.  His most recent 2D echo on guideline directed optimal medical therapy performed 06/03/2021 revealed an improvement in his LV function up to 35 to 40% with moderate increase in pulmonary artery pressures. ? ? ?Current Meds  ?Medication Sig  ? Acetaminophen (TYLENOL ARTHRITIS PAIN PO) Take by mouth as needed.  ? Alirocumab (PRALUENT) 75 MG/ML SOAJ Inject 75 mg into the skin every 14 (fourteen) days.  ? aspirin EC 81 MG tablet Take 81 mg by mouth every morning.  ? carvedilol (COREG) 6.25 MG tablet Take 1 tablet each morning and 2 tablets each night.  ? cetirizine (ZYRTEC) 10 MG tablet Take 10 mg by mouth at bedtime.  ? clopidogrel (PLAVIX) 75 MG tablet Take 1  tablet (75 mg total) by mouth daily.  ? doxazosin (CARDURA) 4 MG tablet Take 1 tablet (4 mg total) by mouth daily.  ? Krill Oil 500 MG CAPS Take 500 mg by mouth at bedtime.  ? loratadine (CLARITIN) 10 MG tablet Take 10 mg by mouth daily.  ? metFORMIN (GLUCOPHAGE-XR) 500 MG 24 hr tablet Take 1 tablet (500 mg total) by mouth 2 (two) times daily with breakfast and lunch.  ? ONETOUCH VERIO test strip 2 (two) times daily.  ? pantoprazole (PROTONIX) 40 MG tablet TAKE 1 TABLET BY  MOUTH EVERY DAY  ? sacubitril-valsartan (ENTRESTO) 97-103 MG Take 1 tablet by mouth 2 (two) times daily.  ? spironolactone (ALDACTONE) 25 MG tablet Take 1 tablet (25 mg total) by mouth daily.  ?  ? ?Allergies  ?Allergen Reactions  ? Albuterol Anaphylaxis  ?  Per patient and daughter  ? Meloxicam Itching, Palpitations and Other (See Comments)  ?  Headaches, Mood swings.  ? Morphine And Related Nausea And Vomiting  ? Statins Other (See Comments)  ?  Myalgias. Severe arthritic response. Tolerates Lovastatin.   ? Cholestoff [Plant Sterols And Stanols] Other (See Comments)  ?  Muscle aches  ? Nsaids Other (See Comments)  ?  Aleve ,  Myalgia  ? Atorvastatin   ?  Severe myalgias  ? Breo Ellipta [Fluticasone Furoate-Vilanterol]   ?  thrush  ? Cortisone Hypertension  ?  Patient states cortisone injections he has received for his knees cause hypertension and hyperglycemia  ? Cortizone-10 Feminine Itch [Hydrocortisone]   ?  "Feeling of heart attack"  ? Darvon [Propoxyphene] Other (See Comments)  ?  hallucinate  ? Doxycycline Cough  ? Emetrol   ?  Other reaction(s): Hallucinate  ? Glimepiride Itching  ? Hydrochlorothiazide   ?  Other reaction(s): Low Blood Pressure  ? Indomethacin   ?  Neck pain flare  ? Jardiance [Empagliflozin]   ?  hallucinations  ? Naproxen   ?  Other reaction(s): Increased Liver Count  ? Penicillins   ?  *positive allergy test*  ? Pravastatin   ?  myalgias  ? Repatha [Evolocumab] Other (See Comments)  ?  hyperglycemia  ? Simvastatin   ?  myalgias  ? Sulfa Antibiotics Nausea And Vomiting  ? Terbinafine Diarrhea  ? Tramadol Other (See Comments)  ?  Hallucinations ?  ? ? ?Social History  ? ?Socioeconomic History  ? Marital status: Married  ?  Spouse name: Not on file  ? Number of children: Not on file  ? Years of education: Not on file  ? Highest education level: Not on file  ?Occupational History  ? Not on file  ?Tobacco Use  ? Smoking status: Never  ? Smokeless tobacco: Never  ?Vaping Use  ? Vaping Use:  Never used  ?Substance and Sexual Activity  ? Alcohol use: No  ? Drug use: No  ? Sexual activity: Yes  ?Other Topics Concern  ? Not on file  ?Social History Narrative  ? Not on file  ? ?Social Determinants of Health  ? ?Financial Resource Strain: Not on file  ?Food Insecurity: Not on file  ?Transportation Needs: Not on file  ?Physical Activity: Not on file  ?Stress: Not on file  ?Social Connections: Not on file  ?Intimate Partner Violence: Not on file  ?  ? ?Review of Systems: ?General: negative for chills, fever, night sweats or weight changes.  ?Cardiovascular: negative for chest pain, dyspnea on exertion, edema, orthopnea, palpitations, paroxysmal nocturnal dyspnea  or shortness of breath ?Dermatological: negative for rash ?Respiratory: negative for cough or wheezing ?Urologic: negative for hematuria ?Abdominal: negative for nausea, vomiting, diarrhea, bright red blood per rectum, melena, or hematemesis ?Neurologic: negative for visual changes, syncope, or dizziness ?All other systems reviewed and are otherwise negative except as noted above. ? ? ? ?Blood pressure (!) 148/84, pulse 62, height 5\' 6"  (1.676 m), weight 141 lb (64 kg).  ?General appearance: alert and no distress ?Neck: no adenopathy, no JVD, supple, symmetrical, trachea midline, thyroid not enlarged, symmetric, no tenderness/mass/nodules, and soft right carotid bruit ?Lungs: clear to auscultation bilaterally ?Heart: regular rate and rhythm, S1, S2 normal, no murmur, click, rub or gallop ?Extremities: extremities normal, atraumatic, no cyanosis or edema ?Pulses: 2+ and symmetric ?Skin: Skin color, texture, turgor normal. No rashes or lesions ?Neurologic: Grossly normal ? ?EKG not performed today ? ?ASSESSMENT AND PLAN:  ? ?Occlusion and stenosis of carotid artery without mention of cerebral infarction ?History of mild bilateral ICA stenosis by duplex ultrasound in September 2022.  We will repeat on an annual basis. ? ?Essential hypertension ?History of  essential hypertension blood pressure measured today at 148/84.  He is on carvedilol and Entresto. ? ?Hyperlipidemia ?History of hyperlipidemia on Praluent with lipid profile performed 12/28/2020 revealin

## 2021-06-15 NOTE — Patient Instructions (Signed)
Medication Instructions:  ?Your physician recommends that you continue on your current medications as directed. Please refer to the Current Medication list given to you today. ? ?*If you need a refill on your cardiac medications before your next appointment, please call your pharmacy* ? ? ?Follow-Up: ?At [facility], you and your health needs are our priority.  As part of our continuing mission to provide you with exceptional heart care, we have created designated Provider Care Teams.  These Care Teams include your primary Cardiologist (physician) and Advanced Practice Providers (APPs -  Physician Assistants and Nurse Practitioners) who all work together to provide you with the care you need, when you need it. ? ?We recommend signing up for the patient portal called "MyChart".  Sign up information is provided on this After Visit Summary.  MyChart is used to connect with patients for Virtual Visits (Telemedicine).  Patients are able to view lab/test results, encounter notes, upcoming appointments, etc.  Non-urgent messages can be sent to your provider as well.   ?To learn more about what you can do with MyChart, go to [email or web address].   ? ?Your next appointment:   ?6 month(s) ? ?The format for your next appointment:   ?In Person ? ?Provider:   ?Jesse Cleaver, FNP, Angela Duke, [name], Callie Goodrich, [name], Jennifer, Lambert, [name], Kathryn Lawrence, DNP, ANP, Hao Meng, [name], or Emily Monge, NP     ? ? ?Then, Jonathan Berry, MD will plan to see you again in 12 month(s). ?

## 2021-06-15 NOTE — Assessment & Plan Note (Signed)
EF initially was 7020 to 25% range.  His most recent echo on guideline directed optimal medical therapy performed 06/03/2021 revealed an improvement after 35 to 40% with moderate elevation of his pulmonary artery pressures.  This represents an improvement compared to his previous echo.  He is clinically asymptomatic. ?

## 2021-06-15 NOTE — Assessment & Plan Note (Signed)
History of mild bilateral ICA stenosis by duplex ultrasound in September 2022.  We will repeat on an annual basis. ?

## 2021-06-15 NOTE — Assessment & Plan Note (Signed)
History of hyperlipidemia on Praluent with lipid profile performed 12/28/2020 revealing total cholesterol 124, LDL 44 and HDL 49. ?

## 2021-07-12 ENCOUNTER — Other Ambulatory Visit: Payer: Self-pay

## 2021-07-12 ENCOUNTER — Telehealth: Payer: Self-pay | Admitting: Cardiovascular Disease

## 2021-07-12 DIAGNOSIS — I10 Essential (primary) hypertension: Secondary | ICD-10-CM

## 2021-07-12 DIAGNOSIS — I5020 Unspecified systolic (congestive) heart failure: Secondary | ICD-10-CM

## 2021-07-12 MED ORDER — CARVEDILOL 6.25 MG PO TABS: 12.5000 mg | ORAL_TABLET | Freq: Two times a day (BID) | ORAL | 2 refills | Status: DC

## 2021-07-12 NOTE — Telephone Encounter (Signed)
Patient reports "blood pressure creeping up since March 137/75, April 140/77, May 144/77."  Today while on phone 192/96, P 65. Ten minutes later 186/106. He has not increased sodium or caffeine in diet. Denies chest discomfort/pain or headache. He stated he worries a lot. Dr. Herbie Baltimore (DOD) ordered an extra dose of Cardura (4 mg) for this morning, and to double his dose of Coreg to 12.5 mg each morning, starting today until seen in the clinic. Patient repeated these instructions back to me in his own words. Patient is to rest for one hour after medications then check BP. Also, an order for BP clinic with pharmD placed. Appointment with Harriet Pho, FNP made for 6/23. New prescription for coreg placed.

## 2021-07-12 NOTE — Telephone Encounter (Signed)
Pt c/o BP issue: STAT if pt c/o blurred vision, one-sided weakness or slurred speech  1. What are your last 5 BP readings? Systolic ranging 102'V-253'G   2. Are you having any other symptoms (ex. Dizziness, headache, blurred vision, passed out)? No   3. What is your BP issue? Hypertension since May. States his BP normally ranges 135/70. Patient states he will get readings together to discuss further when called back.

## 2021-07-13 NOTE — Telephone Encounter (Signed)
Agree 

## 2021-07-20 ENCOUNTER — Other Ambulatory Visit: Payer: Self-pay | Admitting: Cardiology

## 2021-07-20 ENCOUNTER — Other Ambulatory Visit (HOSPITAL_COMMUNITY): Payer: Self-pay | Admitting: *Deleted

## 2021-07-20 MED ORDER — PANTOPRAZOLE SODIUM 40 MG PO TBEC: 40.0000 mg | DELAYED_RELEASE_TABLET | Freq: Every day | ORAL | 1 refills | Status: DC

## 2021-07-27 ENCOUNTER — Ambulatory Visit: Payer: HMO | Admitting: Pharmacist Clinician (PhC)/ Clinical Pharmacy Specialist

## 2021-07-27 ENCOUNTER — Encounter: Payer: Self-pay | Admitting: Pharmacist Clinician (PhC)/ Clinical Pharmacy Specialist

## 2021-07-27 DIAGNOSIS — I10 Essential (primary) hypertension: Secondary | ICD-10-CM

## 2021-07-27 MED ORDER — DOXAZOSIN MESYLATE 4 MG PO TABS: 4.0000 mg | ORAL_TABLET | Freq: Two times a day (BID) | ORAL | 3 refills | Status: DC

## 2021-07-27 NOTE — Progress Notes (Signed)
07/27/2021 David Sellers Oct 12, 1940 [id number]   HPI:  David Sellers is a 81 y.o. male patient of Dr Allyson Sabal, with a PMH below who presents today for hypertension clinic evaluation.  Musical ear syndrome - Moravian dirge (not just in his head)  Home cuff   Past Medical History: Carotid stenosis Post L endarterectomy (2015)  CHF Echo 4/23 - EF at 35-40%, improved from 20-25% one year ago  CAD Cath 8/22 - 80% ostial LM, 80% prox RCA, 95% mid RCA, 95% 1st diagonal  hyperlipidemia 11/22 LDL 44 on alirocumab 75 mg     Blood Pressure Goal:  130/80  Current Medications: carvedilol 12.5 mg bid (just increased), doxazosin 4 mg qd, Entresto 97/103 mg bid, spironolactone 25 mg qd  Family Hx: father died from MI in his 27's (birth father) adopted; birth mother died 7 days after birth - kidney failure ; 1 sister - not in good health, but no heart disease;  pt adopted by mother sister-in law; 2 daughters, one with hypertension, DM; other was able to d/c BP meds with weight loss  Social Hx:  no, no, coffee regularly - 2 cups daily; weaning off diet Dr. Reino Kent  Diet: mix of home and out; does not care for green vegetables; occasional salad, corn, peas; mostly beef, chicken occasional pork; doesn't snack much - goldfish a handful or peanuts  Exercise: 30 min - 10 min tid walking  Home BP readings:  per phone app - average from 5-1 through today 146/78 (range 126-167/67/92)  Intolerances: multiple - see allergy list  Labs: 4/23:  Na 141, K 4.2, Glu 155, BUN 14, SCr 1.05, GFR 71   Wt Readings from Last 3 Encounters:  06/15/21 141 lb (64 kg)  04/30/21 140 lb 9.6 oz (63.8 kg)  01/27/21 134 lb 6.4 oz (61 kg)   BP Readings from Last 3 Encounters:  07/27/21 (!) 202/98  06/15/21 (!) 148/84  04/30/21 (!) 148/80   Pulse Readings from Last 3 Encounters:  06/15/21 62  04/30/21 64  01/27/21 71    Current Outpatient Medications  Medication Sig Dispense Refill   Acetaminophen (TYLENOL  ARTHRITIS PAIN PO) Take by mouth as needed.     Alirocumab (PRALUENT) 75 MG/ML SOAJ Inject 75 mg into the skin every 14 (fourteen) days. 2 mL 11   aspirin EC 81 MG tablet Take 81 mg by mouth every morning.     carvedilol (COREG) 6.25 MG tablet Take 2 tablets (12.5 mg total) by mouth 2 (two) times daily. 120 tablet 2   clopidogrel (PLAVIX) 75 MG tablet Take 1 tablet (75 mg total) by mouth daily. 30 tablet 11   doxazosin (CARDURA) 4 MG tablet Take 1 tablet (4 mg total) by mouth 2 (two) times daily. 180 tablet 3   Krill Oil 500 MG CAPS Take 500 mg by mouth at bedtime.     metFORMIN (GLUCOPHAGE-XR) 500 MG 24 hr tablet Take 1 tablet (500 mg total) by mouth 2 (two) times daily with breakfast and lunch.     ONETOUCH VERIO test strip 2 (two) times daily.     pantoprazole (PROTONIX) 40 MG tablet TAKE 1 TABLET BY MOUTH EVERY DAY 90 tablet 1   sacubitril-valsartan (ENTRESTO) 97-103 MG Take 1 tablet by mouth 2 (two) times daily. 60 tablet 3   spironolactone (ALDACTONE) 25 MG tablet Take 1 tablet (25 mg total) by mouth daily. 90 tablet 3   cetirizine (ZYRTEC) 10 MG tablet Take 10 mg by mouth  at bedtime. (Patient not taking: Reported on 07/27/2021)     loratadine (CLARITIN) 10 MG tablet Take 10 mg by mouth daily. (Patient not taking: Reported on 07/27/2021)     pantoprazole (PROTONIX) 40 MG tablet Take 1 tablet (40 mg total) by mouth daily. 90 tablet 1   No current facility-administered medications for this visit.    Allergies  Allergen Reactions   Albuterol Anaphylaxis    Per patient and daughter   Meloxicam Itching, Palpitations and Other (See Comments)    Headaches, Mood swings.   Morphine And Related Nausea And Vomiting   Statins Other (See Comments)    Myalgias. Severe arthritic response. Tolerates Lovastatin.    Cholestoff [Plant Sterols And Stanols] Other (See Comments)    Muscle aches   Nsaids Other (See Comments)    Aleve ,  Myalgia   Atorvastatin     Severe myalgias   Breo Ellipta  [Fluticasone Furoate-Vilanterol]     thrush   Cortisone Hypertension    Patient states cortisone injections he has received for his knees cause hypertension and hyperglycemia   Cortizone-10 Feminine Itch [Hydrocortisone]     "Feeling of heart attack"   Darvon [Propoxyphene] Other (See Comments)    hallucinate   Doxycycline Cough   Emetrol     Other reaction(s): Hallucinate   Glimepiride Itching   Hydrochlorothiazide     Other reaction(s): Low Blood Pressure   Indomethacin     Neck pain flare   Jardiance [Empagliflozin]     hallucinations   Naproxen     Other reaction(s): Increased Liver Count   Penicillins     *positive allergy test*   Pravastatin     myalgias   Repatha [Evolocumab] Other (See Comments)    hyperglycemia   Simvastatin     myalgias   Sulfa Antibiotics Nausea And Vomiting   Terbinafine Diarrhea   Tramadol Other (See Comments)    Hallucinations     Past Medical History:  Diagnosis Date   Arthritis    Asthma    only has flareup with smelly perfume    BPH (benign prostatic hyperplasia)    Carotid artery occlusion    right ICA 50% stenosis   Carotodynia    Cataract    immature to the right eye   Diabetes mellitus without complication (HCC)    borderline   GERD (gastroesophageal reflux disease)    takes Omeprazole daily   Headache(784.0)    r/t arthritis in neck    Hemorrhoids    History of bronchitis    last time 10-75yrs ago   History of colon polyps    Hyperlipidemia    no meds since Jan 1   Hypertension    takes Verapamil,Lisinopril,and Cardura daily   Joint pain    Legally blind in left eye, as defined in [place]    hit by a baseball   Low back pain    reason unknown   Pneumonia    3 times in a row   Prolapsed hemorrhoids    Urinary frequency    sees Dr.Tannenbaum    Blood pressure (!) 202/98.  Essential hypertension Patient with essential hypertension, overlapping with white coat hypertension.  Home cuff read within 10 points of  office reading today, so will use home readings as starting point for treatment.  Will have him increase doxazosin back to 4 mg twice daily.  Advised to watch for any episodes of orthostatic hypotension for the first week.  He should continue with home  BP monitoring 3-4 times per week (does not need daily monitoring) and return in 2 months for follow up.     Tommy Medal PharmD CPP [facility] [address] [place], [place] [address] [phone]

## 2021-07-27 NOTE — Assessment & Plan Note (Signed)
Patient with essential hypertension, overlapping with white coat hypertension.  Home cuff read within 10 points of office reading today, so will use home readings as starting point for treatment.  Will have him increase doxazosin back to 4 mg twice daily.  Advised to watch for any episodes of orthostatic hypotension for the first week.  He should continue with home BP monitoring 3-4 times per week (does not need daily monitoring) and return in 2 months for follow up.

## 2021-07-27 NOTE — Patient Instructions (Addendum)
Return for a a follow up appointment 09/28/21 at 11 am  Check your blood pressure at home 3-4 times each week and keep record of the readings.  Take your BP meds as follows:  Increase doxazosin back to twice daily (prescription sent to pharmacy)    Try stopping the claritin/zyrtec and see if you can get by with just the chlor-trimeton.    Questions or concerns - call Belenda Cruise at [phone]  Bring all of your meds, your BP cuff and your record of home blood pressures to your next appointment.  Exercise as you're able, try to walk approximately 30 minutes per day.  Keep salt intake to a minimum, especially watch canned and prepared boxed foods.  Eat more fresh fruits and vegetables and fewer canned items.  Avoid eating in fast food restaurants.    HOW TO TAKE YOUR BLOOD PRESSURE: Rest 5 minutes before taking your blood pressure.  Don't smoke or drink caffeinated beverages for at least 30 minutes before. Take your blood pressure before (not after) you eat. Sit comfortably with your back supported and both feet on the floor (don't cross your legs). Elevate your arm to heart level on a table or a desk. Use the proper sized cuff. It should fit smoothly and snugly around your bare upper arm. There should be enough room to slip a fingertip under the cuff. The bottom edge of the cuff should be 1 inch above the crease of the elbow. Ideally, take 3 measurements at one sitting and record the average.

## 2021-07-30 ENCOUNTER — Ambulatory Visit: Payer: HMO | Admitting: Adult Health

## 2021-08-04 ENCOUNTER — Other Ambulatory Visit (HOSPITAL_COMMUNITY): Payer: Self-pay

## 2021-08-04 MED ORDER — ENTRESTO 97-103 MG PO TABS: 1.0000 | ORAL_TABLET | Freq: Two times a day (BID) | ORAL | 1 refills | Status: DC

## 2021-08-09 ENCOUNTER — Other Ambulatory Visit: Payer: Self-pay | Admitting: Cardiology

## 2021-09-06 ENCOUNTER — Encounter (HOSPITAL_BASED_OUTPATIENT_CLINIC_OR_DEPARTMENT_OTHER): Payer: Self-pay | Admitting: Family

## 2021-09-06 ENCOUNTER — Ambulatory Visit (HOSPITAL_BASED_OUTPATIENT_CLINIC_OR_DEPARTMENT_OTHER): Payer: HMO | Admitting: Family

## 2021-09-06 ENCOUNTER — Telehealth: Payer: Self-pay | Admitting: Cardiovascular Disease

## 2021-09-06 VITALS — BP 182/98 | HR 64 | Ht 66.0 in | Wt 140.0 lb

## 2021-09-06 DIAGNOSIS — I10 Essential (primary) hypertension: Secondary | ICD-10-CM

## 2021-09-06 DIAGNOSIS — R002 Palpitations: Secondary | ICD-10-CM | POA: Diagnosis not present

## 2021-09-06 DIAGNOSIS — E785 Hyperlipidemia, unspecified: Secondary | ICD-10-CM

## 2021-09-06 DIAGNOSIS — I25118 Atherosclerotic heart disease of native coronary artery with other forms of angina pectoris: Secondary | ICD-10-CM | POA: Diagnosis not present

## 2021-09-06 DIAGNOSIS — I5020 Unspecified systolic (congestive) heart failure: Secondary | ICD-10-CM

## 2021-09-06 NOTE — Telephone Encounter (Signed)
Noted. Will address at clinic visit.   Marylene Masek S Idamay Hosein, NP  

## 2021-09-06 NOTE — Progress Notes (Signed)
Office Visit    Patient Name: David Sellers Date of Encounter: 09/06/2021  PCP:  Irena Reichmann, DO   [facility]  Cardiologist:  Nanetta Batty, MD  Advanced Practice Provider:  No care team member to display Electrophysiologist:  None      Chief Complaint    David Sellers is a 81 y.o. male presents today for chest pain   Past Medical History    Past Medical History:  Diagnosis Date   Arthritis    Asthma    only has flareup with smelly perfume    BPH (benign prostatic hyperplasia)    Carotid artery occlusion    right ICA 50% stenosis   Carotodynia    Cataract    immature to the right eye   Diabetes mellitus without complication (HCC)    borderline   GERD (gastroesophageal reflux disease)    takes Omeprazole daily   Headache(784.0)    r/t arthritis in neck    Hemorrhoids    History of bronchitis    last time 10-10yrs ago   History of colon polyps    Hyperlipidemia    no meds since Jan 1   Hypertension    takes Verapamil,Lisinopril,and Cardura daily   Joint pain    Legally blind in left eye, as defined in [place]    hit by a baseball   Low back pain    reason unknown   Pneumonia    3 times in a row   Prolapsed hemorrhoids    Urinary frequency    sees Dr.Tannenbaum   Past Surgical History:  Procedure Laterality Date   CAROTID ENDARTERECTOMY Left 05/08/13   COLONOSCOPY     EGD with dilitation     ENDARTERECTOMY Left 05/08/2013   Procedure: LEFT CAROTID ARTERY ENDARTERECTOMY WITH DACRON PATCH ANGIOPLASTY;  Surgeon: Sherren Kerns, MD;  Location: [facility] OR;  Service: Vascular;  Laterality: Left;   EYE SURGERY     HEMORRHOID SURGERY N/A 08/13/2019   Procedure: OPEN HEMORRHOIDECTOMY;  Surgeon: Darnell Level, MD;  Location: [facility];  Service: General;  Laterality: N/A;  LMA   HERNIA REPAIR     x 2   INGUINAL HERNIA REPAIR Right 11/09/2020   Procedure: OPEN REPAIR RIGHT INGUINAL HERNIA;  Surgeon: Darnell Level, MD;  Location: [facility]  OR;  Service: General;  Laterality: Right;   INSERTION OF MESH Right 11/09/2020   Procedure: INSERTION OF MESH;  Surgeon: Darnell Level, MD;  Location: [facility] OR;  Service: General;  Laterality: Right;   RETINAL DETACHMENT SURGERY     retinal laser surgery Left    plate placed   RIGHT/LEFT HEART CATH AND CORONARY ANGIOGRAPHY N/A 09/08/2020   Procedure: RIGHT/LEFT HEART CATH AND CORONARY ANGIOGRAPHY;  Surgeon: Laurey Morale, MD;  Location: [facility] INV[facility]IVE [facility];  Service: Cardiovascular;  Laterality: N/A;    Allergies  Allergies  Allergen Reactions   Albuterol Anaphylaxis    Per patient and daughter   Meloxicam Itching, Palpitations and Other (See Comments)    Headaches, Mood swings.   Morphine And Related Nausea And Vomiting   Statins Other (See Comments)    Myalgias. Severe arthritic response. Tolerates Lovastatin.    Cholestoff [Plant Sterols And Stanols] Other (See Comments)    Muscle aches   Nsaids Other (See Comments)    Aleve ,  Myalgia   Atorvastatin     Severe myalgias   Breo Ellipta [Fluticasone Furoate-Vilanterol]     thrush  Cortisone Hypertension    Patient states cortisone injections he has received for his knees cause hypertension and hyperglycemia   Cortizone-10 Feminine Itch [Hydrocortisone]     "Feeling of heart attack"   Darvon [Propoxyphene] Other (See Comments)    hallucinate   Doxycycline Cough   Emetrol     Other reaction(s): Hallucinate   Glimepiride Itching   Hydrochlorothiazide     Other reaction(s): Low Blood Pressure   Indomethacin     Neck pain flare   Jardiance [Empagliflozin]     hallucinations   Naproxen     Other reaction(s): Increased Liver Count   Penicillins     *positive allergy test*   Pravastatin     myalgias   Repatha [Evolocumab] Other (See Comments)    hyperglycemia   Simvastatin     myalgias   Sulfa Antibiotics Nausea And Vomiting   Terbinafine Diarrhea   Tramadol Other (See Comments)    Hallucinations     History of  Present Illness    David Sellers is a 81 y.o. 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 Family history of coronary disease.   Myoview 04/2013 for preop clearance low risk study. Echo 06/2020 LVEF 20-25% with subsequent evaluation by HF clinic with LM and three vessel disease. He declined CABG and PCI electing to pursue medical therapy. Cardiac MRI at the time did show viability. Echo 05/2021 EF 35-40%, moderate increase in pulmonary pressures. Prior sleep study negative for OSA.   Seen 07/27/21 by pharmacy team. Home cuff found to read within 10 points of office visit. Noted white coat hypertension. Doxazosin increased to 4mg  BID.   He called the office noting a "flash of possible palpitations across chest" and BP 173/87. He was scheduled for office visit. Tells me starting yesterday having episodes of a sensation across his lower chest but says "I hesitate to describe it as pain". It is in his upper abdominal area. It happens intermittently and is like a sensation of a flash above his abdomen. Has happened most often at rest. Not associated with shortness of breath, nausea, nor diaphoresis. Feels it goes along with his pulse. Overall has been feeling very well and excited for upcoming trip to visit his daughter in [place], [place]. 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 BP at home 120s-130s.Labs coming up Thursday with his PCP. Does note bowel irregularity and some loose stool.  EKGs/Labs/Other Studies Reviewed:   The following studies were reviewed today:  [facility] 09/08/20     Prox RCA lesion is 80% stenosed.   Mid RCA lesion is 95% stenosed.   Ost Cx lesion is 100% stenosed.   Ost LM lesion is 80% stenosed.   Mid LAD lesion is 60% stenosed.   1st Diag lesion is 95% stenosed.   Dist LAD lesion is 70% stenosed.   1. Low filling pressures.  2. Preserved  cardiac output.  3. Severe coronary disease with critical mid RCA stenosis, occluded ostial LCx with collaterals from PDA, and 80% left main stenosis with moderate LAD disease.    I will admit the patient for consultation for CABG.  This is complicated by ischemic cardiomyopathy, though he is well-compensated.   Cardiac MRI 09/2020 IMPRESSION: 1. Normal left ventricular size with wall motion abnormalities as noted above. LV EF 25-30% (images not ideal for quantification by Simpson's rule).   2.  Normal RV size  and systolic function.   3. There is significant myocardial viability. There is non-transmural scar only in the basal to mid inferolateral wall. May see significant LV functional improvement with full revascularization.   Carotid duplex 10/2020 Summary:  Right Carotid: Velocities in the right ICA are consistent with a 1-39%  stenosis.   Left Carotid: Velocities in the left ICA are consistent with a 1-39%  stenosis.               Patent CEA.   Vertebrals:  Left vertebral artery demonstrates antegrade flow. Right  vertebral              artery demonstrates bidirectional flow.  Subclavians: Right subclavian artery flow was disturbed. Normal flow               hemodynamics were seen in the left subclavian artery.   Summary:  Right Carotid: Velocities in the right ICA are consistent with a 1-39%  stenosis.   Left Carotid: Velocities in the left ICA are consistent with a 1-39%  stenosis.               Patent CEA.   Vertebrals:  Left vertebral artery demonstrates antegrade flow. Right  vertebral              artery demonstrates bidirectional flow.  Subclavians: Right subclavian artery flow was disturbed. Normal flow               hemodynamics were seen in the left subclavian artery.   Echo 05/2021  1. Left ventricular ejection fraction, by estimation, is 35 to 40%. Left  ventricular ejection fraction by 3D volume is 35 %. The left ventricle has  moderately decreased  function. The left ventricle has no regional wall  motion abnormalities. Left  ventricular diastolic parameters are consistent with Grade I diastolic  dysfunction (impaired relaxation). There is akinesis of the left  ventricular, entire inferolateral wall. The average left ventricular  global longitudinal strain is -12.4 %. The global   longitudinal strain is abnormal.   2. Right ventricular systolic function is normal. The right ventricular  size is normal. There is moderately elevated pulmonary artery systolic  pressure.   3. The mitral valve is normal in structure. Trivial mitral valve  regurgitation. No evidence of mitral stenosis.   4. The aortic valve is tricuspid. Aortic valve regurgitation is not  visualized. Aortic valve sclerosis/calcification is present, without any  evidence of aortic stenosis.   5. There is mild dilatation of the aortic root, measuring 40 mm.   6. The inferior vena cava is normal in size with greater than 50%  respiratory variability, suggesting right atrial pressure of 3 mmHg   7. Compared to echo dated 06/24/2020, LVF has improved.   EKG:  EKG is ordered today.  The ekg ordered today demonstrates NSR 63 bpm with LVH repolarization (stable TWI V6). No acute St/T wave changes.   Recent Labs: 09/15/2020: B Natriuretic Peptide 231.3 11/03/2020: Hemoglobin 14.0; Platelets 190 05/10/2021: BUN 14; Creatinine, Ser 1.05; Potassium 4.2; Sodium 141  Recent Lipid Panel    Component Value Date/Time   CHOL 124 12/28/2020 1007   TRIG 195 (H) 12/28/2020 1007   HDL 49 12/28/2020 1007   CHOLHDL 2.5 12/28/2020 1007   CHOLHDL 3.7 09/21/2020 0957   VLDL 33 09/21/2020 0957   LDLCALC 44 12/28/2020 1007     Home Medications   Current Meds  Medication Sig   Acetaminophen (TYLENOL ARTHRITIS PAIN PO) Take by mouth  as needed.   Alirocumab (PRALUENT) 75 MG/ML SOAJ Inject 75 mg into the skin every 14 (fourteen) days.   aspirin EC 81 MG tablet Take 81 mg by mouth every  morning.   carvedilol (COREG) 6.25 MG tablet Take 2 tablets (12.5 mg total) by mouth 2 (two) times daily.   clopidogrel (PLAVIX) 75 MG tablet TAKE 1 TABLET BY MOUTH EVERY DAY   doxazosin (CARDURA) 4 MG tablet Take 1 tablet (4 mg total) by mouth 2 (two) times daily.   Krill Oil 500 MG CAPS Take 500 mg by mouth at bedtime.   metFORMIN (GLUCOPHAGE-XR) 500 MG 24 hr tablet Take 1 tablet (500 mg total) by mouth 2 (two) times daily with breakfast and lunch.   ONETOUCH VERIO test strip 2 (two) times daily.   pantoprazole (PROTONIX) 40 MG tablet TAKE 1 TABLET BY MOUTH EVERY DAY   sacubitril-valsartan (ENTRESTO) 97-103 MG Take 1 tablet by mouth 2 (two) times daily. Please schedule an appointment for further refills   spironolactone (ALDACTONE) 25 MG tablet Take 1 tablet (25 mg total) by mouth daily.     Review of Systems      All other systems reviewed and are otherwise negative except as noted above.  Physical Exam    VS:  BP (!) 182/98   Pulse 64   Wt 140 lb (63.5 kg)   SpO2 98%   BMI 22.60 kg/m  , BMI Body mass index is 22.6 kg/m.  Wt Readings from Last 3 Encounters:  09/06/21 140 lb (63.5 kg)  06/15/21 141 lb (64 kg)  04/30/21 140 lb 9.6 oz (63.8 kg)     GEN: Well nourished, well developed, in no acute distress. HEENT: normal. Neck: Supple, no JVD, carotid bruits, or masses. Cardiac: RRR, no murmurs, rubs, or gallops. No clubbing, cyanosis, edema.  Radials/PT 2+ and equal bilaterally.  Respiratory:  Respirations regular and unlabored, clear to auscultation bilaterally. GI: Soft, nontender, nondistended. MS: No deformity or atrophy. Skin: Warm and dry, no rash. Neuro:  Strength and sensation are intact. Psych: Normal affect.  Assessment & Plan    Palpitations -notes sensation below his sternum moving across his left chest.  Associated with his heart beating.  Per his report not significant enough discomfort to describe his pain.  Occurs predominantly at rest.  Consider etiology  muscle spasm versus PVC.  EKG today no acute ST/T wave changes.  He has no exertional symptoms.  No indication for ischemic evaluation.  He has labs upcoming Thursday with his PCP and potassium will be rechecked at that time.  Offered 3-day ZIO but his symptoms are overall not bothersome he prefers to defer.  Carotid stenosis s/p left carotid endarterectomy - 10/2020 Duplex bilateral 1-39% stenosis. Due for duplex 10/2021. Continue ASA, Praluent.   HFrEF / ICM - Euvolemic and well compensated on exam.  Continue carvedilol, Entresto, spironolactone.  Prior intolerance to Jardiance with hallucinations. Marcelline Deist deferred due to multiple medication intolerances/allergies. Low sodium diet, fluid restriction <2L, and daily weights encouraged. Educated to contact our office for weight gain of 2 lbs overnight or 5 lbs in one week.   HLD, LDL goal <70 - Statin intolerance. On Praluent. 12/2020 LDL 44.   CAD - LHC 09/08/20 high grade tandem prox and mid RCA stenoses, occluded Cx, 80% ostial LM with moderate segmental proximal LAD disease. Poor CABG candidate, high risk PCI candidate. Elected for medical management.  GDMT includes aspirin, carvedilol.  HTN - BP much improved since Doxazosin increased to 4mg   BID. Known white coat hypertension. BP cuff at home found to be within 10 points of manual reading. Often 130s/80s at home. Continue current antihypertensive regimen.   Disposition: Follow up in 1 month(s) with pharmacy team as scheduled.   Signed, Alver Sorrow, NP 09/06/2021, 11:54 AM [facility]

## 2021-09-06 NOTE — Telephone Encounter (Signed)
  Pt c/o of Chest Pain: STAT if CP now or developed within 24 hours  1. Are you having CP right now? No   2. Are you experiencing any other symptoms (ex. SOB, nausea, vomiting, sweating)? None   3. How long have you been experiencing CP? This weekend   4. Is your CP continuous or coming and going? Coming and going   5. Have you taken Nitroglycerin? No  ?  Pt said, he is experiencing light shooting pain in his center of his chest, not constant but it does comes and goes, he said, he will sit for 30 mins and will be resolved. He doesn't want to go to ED due to long wait, he said, he is feeling fine but worried about the shooting pain

## 2021-09-06 NOTE — Telephone Encounter (Signed)
Patient reports having a sensation of a flash of possible palpitations across chest, more on left side of chest. BP usual 130s/70s, this morning, 173/87, P 61. Schedule with David Genera, NP for today.

## 2021-09-06 NOTE — Patient Instructions (Signed)
Medication Instructions:  Continue your current medications.   *If you need a refill on your cardiac medications before your next appointment, please call your pharmacy*   Lab Work: None ordered today.   Testing/Procedures: Your EKG today showed normal sinus rhythm.    Follow-Up: At [facility], you and your health needs are our priority.  As part of our continuing mission to provide you with exceptional heart care, we have created designated Provider Care Teams.  These Care Teams include your primary Cardiologist (physician) and Advanced Practice Providers (APPs -  Physician Assistants and Nurse Practitioners) who all work together to provide you with the care you need, when you need it.  We recommend signing up for the patient portal called "MyChart".  Sign up information is provided on this After Visit Summary.  MyChart is used to connect with patients for Virtual Visits (Telemedicine).  Patients are able to view lab/test results, encounter notes, upcoming appointments, etc.  Non-urgent messages can be sent to your provider as well.   To learn more about what you can do with MyChart, go to [email or web address].    Your next appointment:   As scheduled with the pharmacist.   Other Instructions  Your chest discomfort sounds like either PVC's (early beats in the bottom chamber of the heart) or a muscle spasm. Alver Sorrow, NPl will look at your lab work from Dr. Thomasena Edis office to check that your potassium level is normal. If you start having more sensation of heart palpitations, we can consider a ZIO monitor.   Heart Healthy Diet Recommendations: A low-salt diet is recommended. Meats should be grilled, baked, or boiled. Avoid fried foods. Focus on lean protein sources like fish or chicken with vegetables and fruits. The American Heart Association is a Chief Technology Officer!  American Heart Association Diet and Lifeystyle Recommendations   Exercise recommendations: The American Heart  Association recommends 150 minutes of moderate intensity exercise weekly. Try 30 minutes of moderate intensity exercise 4-5 times per week. This could include walking, jogging, or swimming.  Important Information About Sugar

## 2021-09-09 DIAGNOSIS — I779 Disorder of arteries and arterioles, unspecified: Secondary | ICD-10-CM | POA: Diagnosis not present

## 2021-09-09 DIAGNOSIS — E1159 Type 2 diabetes mellitus with other circulatory complications: Secondary | ICD-10-CM | POA: Diagnosis not present

## 2021-09-09 DIAGNOSIS — I5022 Chronic systolic (congestive) heart failure: Secondary | ICD-10-CM | POA: Diagnosis not present

## 2021-09-09 DIAGNOSIS — K219 Gastro-esophageal reflux disease without esophagitis: Secondary | ICD-10-CM | POA: Diagnosis not present

## 2021-09-09 DIAGNOSIS — I10 Essential (primary) hypertension: Secondary | ICD-10-CM | POA: Diagnosis not present

## 2021-09-10 ENCOUNTER — Encounter (HOSPITAL_BASED_OUTPATIENT_CLINIC_OR_DEPARTMENT_OTHER): Payer: Self-pay

## 2021-09-16 DIAGNOSIS — E1159 Type 2 diabetes mellitus with other circulatory complications: Secondary | ICD-10-CM | POA: Diagnosis not present

## 2021-09-16 DIAGNOSIS — I779 Disorder of arteries and arterioles, unspecified: Secondary | ICD-10-CM | POA: Diagnosis not present

## 2021-09-16 DIAGNOSIS — K219 Gastro-esophageal reflux disease without esophagitis: Secondary | ICD-10-CM | POA: Diagnosis not present

## 2021-09-16 DIAGNOSIS — I10 Essential (primary) hypertension: Secondary | ICD-10-CM | POA: Diagnosis not present

## 2021-09-16 DIAGNOSIS — E559 Vitamin D deficiency, unspecified: Secondary | ICD-10-CM | POA: Diagnosis not present

## 2021-09-16 DIAGNOSIS — Z9889 Other specified postprocedural states: Secondary | ICD-10-CM | POA: Diagnosis not present

## 2021-09-16 DIAGNOSIS — I5022 Chronic systolic (congestive) heart failure: Secondary | ICD-10-CM | POA: Diagnosis not present

## 2021-09-16 DIAGNOSIS — I429 Cardiomyopathy, unspecified: Secondary | ICD-10-CM | POA: Diagnosis not present

## 2021-09-16 DIAGNOSIS — N400 Enlarged prostate without lower urinary tract symptoms: Secondary | ICD-10-CM | POA: Diagnosis not present

## 2021-09-28 ENCOUNTER — Encounter: Payer: Self-pay | Admitting: Pharmacist Clinician (PhC)/ Clinical Pharmacy Specialist

## 2021-09-28 ENCOUNTER — Ambulatory Visit: Payer: HMO | Admitting: Pharmacist Clinician (PhC)/ Clinical Pharmacy Specialist

## 2021-09-28 DIAGNOSIS — I10 Essential (primary) hypertension: Secondary | ICD-10-CM

## 2021-09-28 MED ORDER — CARVEDILOL 12.5 MG PO TABS: 12.5000 mg | ORAL_TABLET | Freq: Two times a day (BID) | ORAL | 3 refills | Status: DC

## 2021-09-28 NOTE — Assessment & Plan Note (Signed)
Patient with essential hypertension with overriding white coat hypertension.  Home cuff previously verified and home average at 130/72.  No changes to medications at this time.  He will continue with regular home monitoring and no changes to medications at this time.   He is scheduled for follow up with APP in November.

## 2021-09-28 NOTE — Patient Instructions (Signed)
Return for a a follow up appointment with David Sellers in November  Check your blood pressure at home daily and keep record of the readings.  Take your BP meds as follows:  Continue with your current medications  Bring all of your meds, your BP cuff and your record of home blood pressures to your next appointment.  Exercise as you're able, try to walk approximately 30 minutes per day.  Keep salt intake to a minimum, especially watch canned and prepared boxed foods.  Eat more fresh fruits and vegetables and fewer canned items.  Avoid eating in fast food restaurants.    HOW TO TAKE YOUR BLOOD PRESSURE: Rest 5 minutes before taking your blood pressure.  Don't smoke or drink caffeinated beverages for at least 30 minutes before. Take your blood pressure before (not after) you eat. Sit comfortably with your back supported and both feet on the floor (don't cross your legs). Elevate your arm to heart level on a table or a desk. Use the proper sized cuff. It should fit smoothly and snugly around your bare upper arm. There should be enough room to slip a fingertip under the cuff. The bottom edge of the cuff should be 1 inch above the crease of the elbow. Ideally, take 3 measurements at one sitting and record the average

## 2021-09-28 NOTE — Progress Notes (Signed)
09/28/2021 David Sellers Aug 19, 1940 [id number]   HPI:  David Sellers is a 81 y.o. male patient of Dr David Sellers, with a PMH below who presents today for hypertension clinic evaluation.  I saw him first in June, at which time his BP was noted to be 202/98 - a mix of essential and white coat hypertension.  He had readings from home which averaged 146/78 and home cuff was found to be within 10 points of the office reading.  He also has multiple medication sensitivities, making it harder to find an effective medication.  Since then he was seen by David Sellers and was doing better since the doxazosin was increased.  No medication changes were made in regards to HF or hypertension at that time.   He returns today for follow up. He reports feeling better in the past month than he has in a long time.  He feels like his energy levels are good and he can get about just fine.  His wife argues that he tires easily while walking through the store, but he disputes this.    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 Reino Sellers - now off - has only had 1 since last visit  Diet: mix of home and out; does not care for green vegetables; occasional  salad, corn, peas; mostly beef, chicken occasional pork; doesn't snack much - goldfish a handful or peanuts  Exercise: 30 min - 10 min tid walking  Home BP readings:  home cuff was verified earlier in the office to within 10 points.   per phone app - 30 day average 130/72 (range 119-138/67/78) Previous visit - 45 day average 146/78 (range 126-167/67-92)  Intolerances:  Hctz - caused BP to drop too low  Has other cardiac medication intolerances, none specific to BP  Labs: 4/23:  Na 141, K 4.2, Glu 155, BUN 14, SCr 1.05, GFR 71   Wt Readings from Last 3 Encounters:  09/06/21 140 lb (63.5 kg)  06/15/21 141 lb (64 kg)  04/30/21 140 lb 9.6 oz (63.8 kg)   BP Readings from Last 3 Encounters:  09/28/21 (!) 200/96  09/06/21 (!) 182/98  07/27/21 (!) 202/98   Pulse Readings from Last 3 Encounters:  09/28/21 60  09/06/21 64  06/15/21 62    Current Outpatient Medications  Medication Sig Dispense Refill   Acetaminophen (TYLENOL ARTHRITIS PAIN PO) Take by mouth as needed.     Alirocumab (PRALUENT) 75 MG/ML SOAJ Inject 75 mg into the skin every 14 (fourteen) days. 2 mL 11   aspirin EC 81 MG tablet Take 81 mg by mouth every  morning.     carvedilol (COREG) 12.5 MG tablet Take 1 tablet (12.5 mg total) by mouth 2 (two) times daily. 180 tablet 3   Chlorpheniramine Maleate (CHLOR-TRIMETON ALLERGY PO) Take 2 mg by mouth 2 (two) times daily as needed.     clopidogrel (PLAVIX) 75 MG tablet TAKE 1 TABLET BY MOUTH EVERY DAY 90 tablet 3   doxazosin (CARDURA) 4 MG tablet Take 1 tablet (4 mg total) by mouth 2 (two) times daily. 180 tablet 3   Krill Oil 500 MG CAPS Take 500 mg by mouth at bedtime.     metFORMIN (GLUCOPHAGE-XR) 500 MG 24 hr tablet Take 1 tablet (500 mg total) by mouth 2 (two) times daily with breakfast and lunch.     ONETOUCH VERIO test strip 2 (two) times daily.     pantoprazole (PROTONIX) 40 MG tablet Take 1 tablet (40 mg total) by mouth daily. 90 tablet 1   sacubitril-valsartan  (ENTRESTO) 97-103 MG Take 1 tablet by mouth 2 (two) times daily. Please schedule an appointment for further refills 60 tablet 1   spironolactone (ALDACTONE) 25 MG tablet Take 1 tablet (25 mg total) by mouth daily. 90 tablet 3   No current facility-administered medications for this visit.    Allergies  Allergen Reactions   Albuterol Anaphylaxis    Per patient and daughter   Meloxicam Itching, Palpitations and Other (See Comments)    Headaches, Mood swings.   Morphine And Related Nausea And Vomiting   Statins Other (See Comments)    Myalgias. Severe arthritic response. Tolerates Lovastatin.    Cholestoff [Plant Sterols And Stanols] Other (See Comments)    Muscle aches   Nsaids Other (See Comments)    Aleve ,  Myalgia   Atorvastatin     Severe myalgias   Breo Ellipta [Fluticasone Furoate-Vilanterol]     thrush   Cortisone Hypertension    Patient states cortisone injections he has received for his knees cause hypertension and hyperglycemia   Cortizone-10 Feminine Itch [Hydrocortisone]     "Feeling of heart attack"   Darvon [Propoxyphene] Other (See Comments)    hallucinate   Doxycycline Cough   Emetrol     Other reaction(s): Hallucinate   Glimepiride Itching   Hydrochlorothiazide     Other reaction(s): Low Blood Pressure   Indomethacin     Neck pain flare   Jardiance [Empagliflozin]     hallucinations   Naproxen     Other reaction(s): Increased Liver Count   Penicillins     *positive allergy test*   Pravastatin     myalgias   Repatha [Evolocumab] Other (See Comments)    hyperglycemia   Simvastatin     myalgias   Sulfa Antibiotics Nausea And Vomiting   Terbinafine Diarrhea   Tramadol Other (See Comments)    Hallucinations     Past Medical History:  Diagnosis Date   Arthritis    Asthma    only has flareup with smelly perfume    BPH (benign prostatic hyperplasia)    Carotid artery occlusion    right ICA 50% stenosis   Carotodynia    Cataract    immature to  the right eye   Diabetes mellitus without complication (HCC)    borderline   GERD (gastroesophageal reflux disease)    takes Omeprazole daily   Headache(784.0)    r/t arthritis in neck    Hemorrhoids    History of bronchitis    last time 10-25yrs ago   History of colon polyps  Hyperlipidemia    no meds since Jan 1   Hypertension    takes Verapamil,Lisinopril,and Cardura daily   Joint pain    Legally blind in left eye, as defined in [place]    hit by a baseball   Low back pain    reason unknown   Pneumonia    3 times in a row   Prolapsed hemorrhoids    Urinary frequency    sees Dr.Tannenbaum    Blood pressure (!) 200/96, pulse 60.  Essential hypertension Patient with essential hypertension with overriding white coat hypertension.  Home cuff previously verified and home average at 130/72.  No changes to medications at this time.  He will continue with regular home monitoring and no changes to medications at this time.   He is scheduled for follow up with APP in November.     Phillips Hay PharmD CPP [facility] [address] [place], [place] [address] [phone]

## 2021-10-26 ENCOUNTER — Other Ambulatory Visit (HOSPITAL_COMMUNITY): Payer: Self-pay | Admitting: Pharmacist

## 2021-10-26 MED ORDER — ENTRESTO 97-103 MG PO TABS: 1.0000 | ORAL_TABLET | Freq: Two times a day (BID) | ORAL | 3 refills | Status: DC

## 2021-11-19 ENCOUNTER — Ambulatory Visit (HOSPITAL_COMMUNITY)
Admission: RE | Admit: 2021-11-19 | Payer: HMO | Source: Ambulatory Visit | Attending: Cardiovascular Disease | Admitting: Cardiovascular Disease

## 2021-11-22 ENCOUNTER — Ambulatory Visit (HOSPITAL_COMMUNITY)
Admission: RE | Admit: 2021-11-22 | Discharge: 2021-11-22 | Disposition: A | Discharge status: Home / Self Care | Payer: HMO | Source: Ambulatory Visit | Attending: Cardiovascular Disease | Admitting: Cardiovascular Disease

## 2021-11-22 DIAGNOSIS — I6523 Occlusion and stenosis of bilateral carotid arteries: Secondary | ICD-10-CM | POA: Insufficient documentation

## 2021-12-04 ENCOUNTER — Other Ambulatory Visit: Payer: Self-pay | Admitting: Cardiovascular Disease

## 2021-12-16 ENCOUNTER — Ambulatory Visit: Payer: HMO | Admitting: Nurse Practitioner

## 2021-12-21 ENCOUNTER — Other Ambulatory Visit: Payer: Self-pay | Admitting: Cardiovascular Disease

## 2021-12-28 ENCOUNTER — Other Ambulatory Visit (HOSPITAL_COMMUNITY): Payer: Self-pay | Admitting: Cardiovascular Disease

## 2021-12-29 ENCOUNTER — Ambulatory Visit: Payer: HMO | Attending: Nurse Practitioner | Admitting: Nurse Practitioner

## 2021-12-29 ENCOUNTER — Encounter: Payer: Self-pay | Admitting: Nurse Practitioner

## 2021-12-29 VITALS — BP 140/72 | HR 65 | Ht 66.0 in | Wt 141.2 lb

## 2021-12-29 DIAGNOSIS — I255 Ischemic cardiomyopathy: Secondary | ICD-10-CM | POA: Diagnosis not present

## 2021-12-29 DIAGNOSIS — I2510 Atherosclerotic heart disease of native coronary artery without angina pectoris: Secondary | ICD-10-CM | POA: Diagnosis not present

## 2021-12-29 DIAGNOSIS — E119 Type 2 diabetes mellitus without complications: Secondary | ICD-10-CM

## 2021-12-29 DIAGNOSIS — I779 Disorder of arteries and arterioles, unspecified: Secondary | ICD-10-CM

## 2021-12-29 DIAGNOSIS — I5022 Chronic systolic (congestive) heart failure: Secondary | ICD-10-CM

## 2021-12-29 DIAGNOSIS — R002 Palpitations: Secondary | ICD-10-CM | POA: Diagnosis not present

## 2021-12-29 DIAGNOSIS — I10 Essential (primary) hypertension: Secondary | ICD-10-CM

## 2021-12-29 DIAGNOSIS — E785 Hyperlipidemia, unspecified: Secondary | ICD-10-CM

## 2021-12-29 NOTE — Progress Notes (Signed)
Office Visit    Patient Name: David Sellers Date of Encounter: 12/29/2021  Primary Care Provider:  Irena Reichmannollins, Dana, DO Primary Cardiologist:  Nanetta BattyJonathan Berry, MD  Chief Complaint    81 year old male with a history of CAD, chronic systolic heart failure, ICM, palpitations, carotid artery stenosis s/p left CEA, hypertension, hyperlipidemia, type 2 diabetes, and musical ear syndrome who presents for follow-up related to CAD and heart failure.  Past Medical History    Past Medical History:  Diagnosis Date   Arthritis    Asthma    only has flareup with smelly perfume    BPH (benign prostatic hyperplasia)    Carotid artery occlusion    right ICA 50% stenosis   Carotodynia    Cataract    immature to the right eye   Diabetes mellitus without complication (HCC)    borderline   GERD (gastroesophageal reflux disease)    takes Omeprazole daily   Headache(784.0)    r/t arthritis in neck    Hemorrhoids    History of bronchitis    last time 10-249yrs ago   History of colon polyps    Hyperlipidemia    no meds since Jan 1   Hypertension    takes Verapamil,Lisinopril,and Cardura daily   Joint pain    Legally blind in left eye, as defined in [place]    hit by a baseball   Low back pain    reason unknown   Pneumonia    3 times in a row   Prolapsed hemorrhoids    Urinary frequency    sees Dr.Tannenbaum   Past Surgical History:  Procedure Laterality Date   CAROTID ENDARTERECTOMY Left 05/08/13   COLONOSCOPY     EGD with dilitation     ENDARTERECTOMY Left 05/08/2013   Procedure: LEFT CAROTID ARTERY ENDARTERECTOMY WITH DACRON PATCH ANGIOPLASTY;  Surgeon: Sherren Kernsharles E Fields, MD;  Location: [facility] OR;  Service: Vascular;  Laterality: Left;   EYE SURGERY     HEMORRHOID SURGERY N/A 08/13/2019   Procedure: OPEN HEMORRHOIDECTOMY;  Surgeon: Darnell LevelGerkin, Todd, MD;  Location: [facility];  Service: General;  Laterality: N/A;  LMA   HERNIA REPAIR     x 2   INGUINAL HERNIA REPAIR Right 11/09/2020    Procedure: OPEN REPAIR RIGHT INGUINAL HERNIA;  Surgeon: Darnell LevelGerkin, Todd, MD;  Location: [facility] OR;  Service: General;  Laterality: Right;   INSERTION OF MESH Right 11/09/2020   Procedure: INSERTION OF MESH;  Surgeon: Darnell LevelGerkin, Todd, MD;  Location: [facility] OR;  Service: General;  Laterality: Right;   RETINAL DETACHMENT SURGERY     retinal laser surgery Left    plate placed   RIGHT/LEFT HEART CATH AND CORONARY ANGIOGRAPHY N/A 09/08/2020   Procedure: RIGHT/LEFT HEART CATH AND CORONARY ANGIOGRAPHY;  Surgeon: Laurey MoraleMcLean, Dalton S, MD;  Location: [facility] IN[facility]IVE [facility];  Service: Cardiovascular;  Laterality: N/A;    Allergies  Allergies  Allergen Reactions   Albuterol Anaphylaxis    Per patient and daughter   Meloxicam Itching, Palpitations and Other (See Comments)    Headaches, Mood swings.   Morphine And Related Nausea And Vomiting   Statins Other (See Comments)    Myalgias. Severe arthritic response. Tolerates Lovastatin.    Cholestoff [Plant Sterols And Stanols] Other (See Comments)    Muscle aches   Nsaids Other (See Comments)    Aleve ,  Myalgia   Atorvastatin     Severe myalgias   Breo Ellipta [Fluticasone Furoate-Vilanterol]     thrush  Cortisone Hypertension    Patient states cortisone injections he has received for his knees cause hypertension and hyperglycemia   Cortizone-10 Feminine Itch [Hydrocortisone]     "Feeling of heart attack"   Darvon [Propoxyphene] Other (See Comments)    hallucinate   Doxycycline Cough   Emetrol     Other reaction(s): Hallucinate   Glimepiride Itching   Hydrochlorothiazide     Other reaction(s): Low Blood Pressure   Indomethacin     Neck pain flare   Jardiance [Empagliflozin]     hallucinations   Naproxen     Other reaction(s): Increased Liver Count   Penicillins     *positive allergy test*   Pravastatin     myalgias   Repatha [Evolocumab] Other (See Comments)    hyperglycemia   Simvastatin     myalgias   Sulfa Antibiotics Nausea And Vomiting    Terbinafine Diarrhea   Tramadol Other (See Comments)    Hallucinations     History of Present Illness    81 year old male with the above past medical history including CAD, chronic systolic heart failure, ICM, palpitations, carotid artery stenosis s/p left CEA, hypertension, hyperlipidemia, type 2 diabetes, and musical ear syndrome (he hears a [place] Dirge).  Prior Myoview in 2015 in the setting of preop clearance was low risk.  Cardiac catheterization in 09/2020 revealed high-grade tear of the proximal and mid RCA stenoses, occlusion of left circumflex artery, 80% ostial left main with moderate segmental proximal LAD disease.  He was deemed a poor candidate for CABG, high risk PCI candidate.  Cardiac MRI at the time did show viability, however, medical therapy was recommended.  He is intolerant to statins and is on Praluent.  He has a history of ICM, chronic systolic heart failure, intolerant to Jardiance/Farxiga.  Echocardiogram in 05/2021 showed EF 35 to 40%, moderate increase in pulmonary pressures.  Prior sleep study was negative for OSA.  He was last seen in the office on 09/06/2021 and noted symptoms of atypical chest discomfort, palpitations.  Reports suspicious for muscle spasm versus PVCs.  He declined 3-day Zio patch.  Carotid Dopplers in 11/2021 1 to 35% or ICA stenosis, 1 to 39% LICA stenosis s/p endarterectomy, right subclavian artery stenosis.  Follow-up study was recommended in 12 months.  He presents today for follow-up accompanied by his wife.  Since his last visit he has been stable from a cardiac standpoint. He denies symptoms concerning for angina, denies palpitations, dyspnea, edema, pnd, orthopnea, weight gain. He is awaiting approval for patient assistance for Entresto. Overall, he reports feeling.   Home Medications    Current Outpatient Medications  Medication Sig Dispense Refill   Acetaminophen (TYLENOL ARTHRITIS PAIN PO) Take by mouth as needed.     Alirocumab (PRALUENT)  75 MG/ML SOAJ INJECT 75 MG INTO THE SKIN EVERY 14 (FOURTEEN) DAYS. 6 mL 3   aspirin EC 81 MG tablet Take 81 mg by mouth every morning.     carvedilol (COREG) 12.5 MG tablet Take 1 tablet (12.5 mg total) by mouth 2 (two) times daily. 180 tablet 3   Chlorpheniramine Maleate (CHLOR-TRIMETON ALLERGY PO) Take 2 mg by mouth 2 (two) times daily as needed.     clopidogrel (PLAVIX) 75 MG tablet TAKE 1 TABLET BY MOUTH EVERY DAY 90 tablet 3   doxazosin (CARDURA) 4 MG tablet Take 1 tablet (4 mg total) by mouth 2 (two) times daily. 180 tablet 3   Krill Oil 500 MG CAPS Take 500 mg by mouth at  bedtime.     metFORMIN (GLUCOPHAGE-XR) 500 MG 24 hr tablet Take 1 tablet (500 mg total) by mouth 2 (two) times daily with breakfast and lunch.     ONETOUCH VERIO test strip 2 (two) times daily.     pantoprazole (PROTONIX) 40 MG tablet TAKE 1 TABLET BY MOUTH EVERY DAY 90 tablet 3   sacubitril-valsartan (ENTRESTO) 97-103 MG Take 1 tablet by mouth 2 (two) times daily. 180 tablet 3   spironolactone (ALDACTONE) 25 MG tablet Take 1 tablet (25 mg total) by mouth daily. 90 tablet 3   No current facility-administered medications for this visit.     Review of Systems    He denies chest pain, palpitations, dyspnea, pnd, orthopnea, n, v, dizziness, syncope, edema, weight gain, or early satiety. All other systems reviewed and are otherwise negative except as noted above.   Physical Exam    VS:  BP (!) 140/72   Pulse 65   Ht 5\' 6"  (1.676 m)   Wt 141 lb 3.2 oz (64 kg)   SpO2 98%   BMI 22.79 kg/m   GEN: Well nourished, well developed, in no acute distress. HEENT: normal. Neck: Supple, no JVD, carotid bruits, or masses. Cardiac: RRR, no murmurs, rubs, or gallops. No clubbing, cyanosis, edema.  Radials/DP/PT 2+ and equal bilaterally.  Respiratory:  Respirations regular and unlabored, clear to auscultation bilaterally. GI: Soft, nontender, nondistended, BS + x 4. MS: no deformity or atrophy. Skin: warm and dry, no  rash. Neuro:  Strength and sensation are intact. Psych: Normal affect.  Accessory Clinical Findings    ECG personally reviewed by me today - No EKG in office today.   Lab Results  Component Value Date   WBC 6.2 11/03/2020   HGB 14.0 11/03/2020   HCT 40.5 11/03/2020   MCV 97.6 11/03/2020   PLT 190 11/03/2020   Lab Results  Component Value Date   CREATININE 1.05 05/10/2021   BUN 14 05/10/2021   NA 141 05/10/2021   K 4.2 05/10/2021   CL 103 05/10/2021   CO2 28 05/10/2021   Lab Results  Component Value Date   ALT 27 05/06/2013   AST 21 05/06/2013   ALKPHOS 47 05/06/2013   BILITOT 0.5 05/06/2013   Lab Results  Component Value Date   CHOL 124 12/28/2020   HDL 49 12/28/2020   LDLCALC 44 12/28/2020   TRIG 195 (H) 12/28/2020   CHOLHDL 2.5 12/28/2020    No results found for: "HGBA1C"  Assessment & Plan    1. CAD: Cath  in 09/2020 revealed high-grade tear of the proximal and mid RCA stenoses, occlusion of left circumflex artery, 80% ostial left main with moderate segmental proximal LAD disease. poor candidate for CABG, high risk PCI candidate.  Cardiac MRI at the time did show viability, however, medical therapy was recommended. Stable with no anginal symptoms.  Continue aspirin, Plavix, carvedilol, spironolactone, Entresto, and Praluent.  2. Chronic systolic heart failure/ICM: Echo in 05/2021 showed EF 35 to 40%, moderate increase in pulmonary pressures. Euvolemic and well compensated on exam.  Of note, he is intolerant to SGLT2 inhibitors.  Continue current medications as above.  3. Palpitations: Previously declined monitor.  Denies any recent palpitations.  Stable.  4. Carotid artery disease: Carotid Dopplers in 11/2021 1 to 35% or ICA stenosis, 1 to 39% LICA stenosis s/p endarterectomy, right subclavian artery stenosis.  Repeat study recommended in 1 year.   5. Hypertension: BP well controlled. Continue current antihypertensive regimen.   6. Hyperlipidemia:  LDL was 80 in  09/2021. Monitored and managed per PCP.   7. Type 2 diabetes: A1c was 6.5 in 09/2021. Monitored and managed per PCP.   8. Disposition: Follow-up in 5-6 months with Dr. Allyson Sabal.   Joylene Grapes, NP 12/29/2021, 1:18 PM

## 2021-12-29 NOTE — Patient Instructions (Addendum)
Medication Instructions:  Your physician recommends that you continue on your current medications as directed. Please refer to the Current Medication list given to you today.   *If you need a refill on your cardiac medications before your next appointment, please call your pharmacy*   Lab Work: NONE ordered at this time of appointment   If you have labs (blood work) drawn today and your tests are completely normal, you will receive your results only by: MyChart Message (if you have MyChart) OR A paper copy in the mail If you have any lab test that is abnormal or we need to change your treatment, we will call you to review the results.   Testing/Procedures: NONE ordered at this time of appointment     Follow-Up: At [facility], you and your health needs are our priority.  As part of our continuing mission to provide you with exceptional heart care, we have created designated Provider Care Teams.  These Care Teams include your primary Cardiologist (physician) and Advanced Practice Providers (APPs -  Physician Assistants and Nurse Practitioners) who all work together to provide you with the care you need, when you need it.  We recommend signing up for the patient portal called "MyChart".  Sign up information is provided on this After Visit Summary.  MyChart is used to connect with patients for Virtual Visits (Telemedicine).  Patients are able to view lab/test results, encounter notes, upcoming appointments, etc.  Non-urgent messages can be sent to your provider as well.   To learn more about what you can do with MyChart, go to [email or web address].    Your next appointment:   5-6 month(s)  The format for your next appointment:   In Person  Provider:   Nanetta Batty, MD     Other Instructions [facility] Support Number [phone]-[phone] [phone]).   Important Information About Sugar

## 2022-01-24 ENCOUNTER — Other Ambulatory Visit: Payer: Self-pay | Admitting: Pharmacist Clinician (PhC)/ Clinical Pharmacy Specialist

## 2022-01-24 MED ORDER — ENTRESTO 97-103 MG PO TABS: 1.0000 | ORAL_TABLET | Freq: Two times a day (BID) | ORAL | 3 refills | Status: DC

## 2022-02-02 ENCOUNTER — Other Ambulatory Visit (HOSPITAL_COMMUNITY): Payer: Self-pay

## 2022-02-02 MED ORDER — SPIRONOLACTONE 25 MG PO TABS: 25.0000 mg | ORAL_TABLET | Freq: Every day | ORAL | 0 refills | Status: DC

## 2022-02-28 ENCOUNTER — Encounter: Payer: Self-pay | Admitting: Pharmacist Clinician (PhC)/ Clinical Pharmacy Specialist

## 2022-03-10 DIAGNOSIS — I429 Cardiomyopathy, unspecified: Secondary | ICD-10-CM | POA: Diagnosis not present

## 2022-03-10 DIAGNOSIS — K219 Gastro-esophageal reflux disease without esophagitis: Secondary | ICD-10-CM | POA: Diagnosis not present

## 2022-03-10 DIAGNOSIS — E559 Vitamin D deficiency, unspecified: Secondary | ICD-10-CM | POA: Diagnosis not present

## 2022-03-10 DIAGNOSIS — I5022 Chronic systolic (congestive) heart failure: Secondary | ICD-10-CM | POA: Diagnosis not present

## 2022-03-10 DIAGNOSIS — N400 Enlarged prostate without lower urinary tract symptoms: Secondary | ICD-10-CM | POA: Diagnosis not present

## 2022-03-10 DIAGNOSIS — E1159 Type 2 diabetes mellitus with other circulatory complications: Secondary | ICD-10-CM | POA: Diagnosis not present

## 2022-03-10 DIAGNOSIS — I10 Essential (primary) hypertension: Secondary | ICD-10-CM | POA: Diagnosis not present

## 2022-03-10 DIAGNOSIS — I779 Disorder of arteries and arterioles, unspecified: Secondary | ICD-10-CM | POA: Diagnosis not present

## 2022-03-10 DIAGNOSIS — Z9889 Other specified postprocedural states: Secondary | ICD-10-CM | POA: Diagnosis not present

## 2022-03-17 DIAGNOSIS — M5137 Other intervertebral disc degeneration, lumbosacral region: Secondary | ICD-10-CM | POA: Diagnosis not present

## 2022-03-17 DIAGNOSIS — I429 Cardiomyopathy, unspecified: Secondary | ICD-10-CM | POA: Diagnosis not present

## 2022-03-17 DIAGNOSIS — I5022 Chronic systolic (congestive) heart failure: Secondary | ICD-10-CM | POA: Diagnosis not present

## 2022-03-17 DIAGNOSIS — E559 Vitamin D deficiency, unspecified: Secondary | ICD-10-CM | POA: Diagnosis not present

## 2022-03-17 DIAGNOSIS — E1159 Type 2 diabetes mellitus with other circulatory complications: Secondary | ICD-10-CM | POA: Diagnosis not present

## 2022-03-17 DIAGNOSIS — Z79899 Other long term (current) drug therapy: Secondary | ICD-10-CM | POA: Diagnosis not present

## 2022-03-17 DIAGNOSIS — Z9889 Other specified postprocedural states: Secondary | ICD-10-CM | POA: Diagnosis not present

## 2022-03-17 DIAGNOSIS — Z0000 Encounter for general adult medical examination without abnormal findings: Secondary | ICD-10-CM | POA: Diagnosis not present

## 2022-03-27 ENCOUNTER — Emergency Department (HOSPITAL_COMMUNITY): Payer: PPO

## 2022-03-27 ENCOUNTER — Inpatient Hospital Stay (HOSPITAL_COMMUNITY)
Admission: EM | Admit: 2022-03-27 | Discharge: 2022-03-30 | DRG: 311 | Disposition: A | Discharge status: Home / Self Care | Payer: PPO | Attending: Cardiology | Admitting: Cardiology

## 2022-03-27 DIAGNOSIS — R262 Difficulty in walking, not elsewhere classified: Secondary | ICD-10-CM | POA: Diagnosis present

## 2022-03-27 DIAGNOSIS — Z66 Do not resuscitate: Secondary | ICD-10-CM | POA: Diagnosis present

## 2022-03-27 DIAGNOSIS — I249 Acute ischemic heart disease, unspecified: Secondary | ICD-10-CM | POA: Diagnosis present

## 2022-03-27 DIAGNOSIS — I160 Hypertensive urgency: Secondary | ICD-10-CM | POA: Diagnosis not present

## 2022-03-27 DIAGNOSIS — Z833 Family history of diabetes mellitus: Secondary | ICD-10-CM | POA: Diagnosis not present

## 2022-03-27 DIAGNOSIS — I071 Rheumatic tricuspid insufficiency: Secondary | ICD-10-CM | POA: Diagnosis present

## 2022-03-27 DIAGNOSIS — I5022 Chronic systolic (congestive) heart failure: Secondary | ICD-10-CM | POA: Diagnosis not present

## 2022-03-27 DIAGNOSIS — N400 Enlarged prostate without lower urinary tract symptoms: Secondary | ICD-10-CM | POA: Diagnosis present

## 2022-03-27 DIAGNOSIS — I5042 Chronic combined systolic (congestive) and diastolic (congestive) heart failure: Secondary | ICD-10-CM | POA: Diagnosis present

## 2022-03-27 DIAGNOSIS — K219 Gastro-esophageal reflux disease without esophagitis: Secondary | ICD-10-CM | POA: Diagnosis present

## 2022-03-27 DIAGNOSIS — I214 Non-ST elevation (NSTEMI) myocardial infarction: Secondary | ICD-10-CM | POA: Diagnosis present

## 2022-03-27 DIAGNOSIS — I10 Essential (primary) hypertension: Secondary | ICD-10-CM | POA: Diagnosis not present

## 2022-03-27 DIAGNOSIS — F32A Depression, unspecified: Secondary | ICD-10-CM | POA: Diagnosis not present

## 2022-03-27 DIAGNOSIS — I169 Hypertensive crisis, unspecified: Secondary | ICD-10-CM | POA: Diagnosis present

## 2022-03-27 DIAGNOSIS — I2489 Other forms of acute ischemic heart disease: Secondary | ICD-10-CM | POA: Diagnosis present

## 2022-03-27 DIAGNOSIS — J45909 Unspecified asthma, uncomplicated: Secondary | ICD-10-CM | POA: Diagnosis present

## 2022-03-27 DIAGNOSIS — I358 Other nonrheumatic aortic valve disorders: Secondary | ICD-10-CM | POA: Diagnosis present

## 2022-03-27 DIAGNOSIS — Z841 Family history of disorders of kidney and ureter: Secondary | ICD-10-CM | POA: Diagnosis not present

## 2022-03-27 DIAGNOSIS — Z7982 Long term (current) use of aspirin: Secondary | ICD-10-CM

## 2022-03-27 DIAGNOSIS — M79662 Pain in left lower leg: Secondary | ICD-10-CM | POA: Diagnosis present

## 2022-03-27 DIAGNOSIS — Z79899 Other long term (current) drug therapy: Secondary | ICD-10-CM | POA: Diagnosis not present

## 2022-03-27 DIAGNOSIS — I255 Ischemic cardiomyopathy: Secondary | ICD-10-CM | POA: Diagnosis present

## 2022-03-27 DIAGNOSIS — I2510 Atherosclerotic heart disease of native coronary artery without angina pectoris: Secondary | ICD-10-CM | POA: Diagnosis present

## 2022-03-27 DIAGNOSIS — I110 Hypertensive heart disease with heart failure: Secondary | ICD-10-CM | POA: Diagnosis present

## 2022-03-27 DIAGNOSIS — I77810 Thoracic aortic ectasia: Secondary | ICD-10-CM | POA: Diagnosis present

## 2022-03-27 DIAGNOSIS — E785 Hyperlipidemia, unspecified: Secondary | ICD-10-CM | POA: Diagnosis present

## 2022-03-27 DIAGNOSIS — Z86010 Personal history of colonic polyps: Secondary | ICD-10-CM | POA: Diagnosis not present

## 2022-03-27 DIAGNOSIS — J811 Chronic pulmonary edema: Secondary | ICD-10-CM | POA: Diagnosis not present

## 2022-03-27 DIAGNOSIS — Z885 Allergy status to narcotic agent status: Secondary | ICD-10-CM

## 2022-03-27 DIAGNOSIS — Z83438 Family history of other disorder of lipoprotein metabolism and other lipidemia: Secondary | ICD-10-CM

## 2022-03-27 DIAGNOSIS — I493 Ventricular premature depolarization: Secondary | ICD-10-CM | POA: Diagnosis present

## 2022-03-27 DIAGNOSIS — R52 Pain, unspecified: Secondary | ICD-10-CM | POA: Diagnosis not present

## 2022-03-27 DIAGNOSIS — E876 Hypokalemia: Secondary | ICD-10-CM | POA: Diagnosis present

## 2022-03-27 DIAGNOSIS — H548 Legal blindness, as defined in USA: Secondary | ICD-10-CM | POA: Diagnosis present

## 2022-03-27 DIAGNOSIS — Z8249 Family history of ischemic heart disease and other diseases of the circulatory system: Secondary | ICD-10-CM | POA: Diagnosis not present

## 2022-03-27 DIAGNOSIS — E118 Type 2 diabetes mellitus with unspecified complications: Secondary | ICD-10-CM | POA: Diagnosis present

## 2022-03-27 DIAGNOSIS — Z7902 Long term (current) use of antithrombotics/antiplatelets: Secondary | ICD-10-CM

## 2022-03-27 DIAGNOSIS — I5020 Unspecified systolic (congestive) heart failure: Secondary | ICD-10-CM | POA: Diagnosis present

## 2022-03-27 DIAGNOSIS — R079 Chest pain, unspecified: Secondary | ICD-10-CM | POA: Diagnosis not present

## 2022-03-27 DIAGNOSIS — Z882 Allergy status to sulfonamides status: Secondary | ICD-10-CM

## 2022-03-27 DIAGNOSIS — Z7984 Long term (current) use of oral hypoglycemic drugs: Secondary | ICD-10-CM | POA: Diagnosis not present

## 2022-03-27 DIAGNOSIS — Z880 Allergy status to penicillin: Secondary | ICD-10-CM

## 2022-03-27 DIAGNOSIS — Z888 Allergy status to other drugs, medicaments and biological substances status: Secondary | ICD-10-CM

## 2022-03-27 HISTORY — DX: Heart failure, unspecified: I50.9

## 2022-03-27 HISTORY — DX: Atherosclerotic heart disease of native coronary artery without angina pectoris: I25.10

## 2022-03-27 LAB — COMPREHENSIVE METABOLIC PANEL
ALT: 15 U/L (ref 0–44)
AST: 21 U/L (ref 15–41)
Albumin: 3.4 g/dL — ABNORMAL LOW (ref 3.5–5.0)
Alkaline Phosphatase: 32 U/L — ABNORMAL LOW (ref 38–126)
Anion gap: 14 (ref 5–15)
BUN: 13 mg/dL (ref 8–23)
CO2: 19 mmol/L — ABNORMAL LOW (ref 22–32)
Calcium: 8.6 mg/dL — ABNORMAL LOW (ref 8.9–10.3)
Chloride: 106 mmol/L (ref 98–111)
Creatinine, Ser: 1.04 mg/dL (ref 0.61–1.24)
GFR, Estimated: >60 mL/min (ref >60)
Glucose, Bld: 194 mg/dL — ABNORMAL HIGH (ref 70–99)
Potassium: 3.4 mmol/L — ABNORMAL LOW (ref 3.5–5.1)
Sodium: 139 mmol/L (ref 135–145)
Total Bilirubin: 0.9 mg/dL (ref 0.3–1.2)
Total Protein: 5.8 g/dL — ABNORMAL LOW (ref 6.5–8.1)

## 2022-03-27 LAB — CBC WITH DIFFERENTIAL/PLATELET
Abs Immature Granulocytes: 0.02 10*3/uL (ref 0.00–0.07)
Basophils Absolute: 0.1 10*3/uL (ref 0.0–0.1)
Basophils Relative: 1 %
Eosinophils Absolute: 0.2 10*3/uL (ref 0.0–0.5)
Eosinophils Relative: 4 %
HCT: 34.9 % — ABNORMAL LOW (ref 39.0–52.0)
Hemoglobin: 12.2 g/dL — ABNORMAL LOW (ref 13.0–17.0)
Immature Granulocytes: 0 %
Lymphocytes Relative: 13 %
Lymphs Abs: 0.7 10*3/uL (ref 0.7–4.0)
MCH: 33.9 pg (ref 26.0–34.0)
MCHC: 35 g/dL (ref 30.0–36.0)
MCV: 96.9 fL (ref 80.0–100.0)
Monocytes Absolute: 0.2 10*3/uL (ref 0.1–1.0)
Monocytes Relative: 5 %
Neutro Abs: 4 10*3/uL (ref 1.7–7.7)
Neutrophils Relative %: 77 %
Platelets: 169 10*3/uL (ref 150–400)
RBC: 3.6 MIL/uL — ABNORMAL LOW (ref 4.22–5.81)
RDW: 12.6 % (ref 11.5–15.5)
WBC: 5.2 10*3/uL (ref 4.0–10.5)
nRBC: 0 % (ref 0.0–0.2)

## 2022-03-27 LAB — TROPONIN I (HIGH SENSITIVITY)
Troponin I (High Sensitivity): 198 ng/L (ref <18)
Troponin I (High Sensitivity): 375 ng/L (ref <18)

## 2022-03-27 LAB — BRAIN NATRIURETIC PEPTIDE: B Natriuretic Peptide: 1638.1 pg/mL — ABNORMAL HIGH (ref 0.0–100.0)

## 2022-03-27 LAB — PROTIME-INR
INR: 1.1 (ref 0.8–1.2)
Prothrombin Time: 14.3 seconds (ref 11.4–15.2)

## 2022-03-27 MED ORDER — HEPARIN (PORCINE) 25000 UT/250ML-% IV SOLN
1150.0000 [IU]/h | INTRAVENOUS | Status: DC
  Administered 2022-03-27: 750 [IU]/h via INTRAVENOUS
  Administered 2022-03-28 – 2022-03-29 (×2): 1150 [IU]/h via INTRAVENOUS
  Filled 2022-03-27 (×3): qty 250

## 2022-03-27 MED ORDER — HEPARIN BOLUS VIA INFUSION
3500.0000 [IU] | Freq: Once | INTRAVENOUS | Status: AC
  Administered 2022-03-27: 3500 [IU] via INTRAVENOUS
  Filled 2022-03-27: qty 3500

## 2022-03-27 NOTE — Progress Notes (Signed)
Interventional Cardiology Note:  Patient met at bedside due to STEMI activation.  Patient with history of MV CAD (no h/o CABG or PCI), ICM EF 35-40% here with chest pain of a few hours duration.  Patient reports BP usually < 130 sys, over the last few days, has been quite elevated.  Here, patient with BP 190/90.  Endorses 2/10 CP.  EKG without STEs.  Will defer emergent coronary angiography at this time.  Discussed with ED attending.  Pursue intensive BP control at this time.

## 2022-03-27 NOTE — ED Triage Notes (Signed)
Patient [facility] for evaluation of chest pain that started at 1530 today. Pain in left side of chest and jaw. Patient received 324 mg ASA and 1x SL NTG from EMS PTA. 20g saline lock in left forearm.

## 2022-03-27 NOTE — ED Provider Notes (Signed)
[facility] Provider Note  CSN: [id number] Arrival date & time: 03/27/22 1627  Chief Complaint(s) Chest Pain  HPI David Sellers is a 82 y.o. male history of diabetes, hypertension, hyperlipidemia, CHF, coronary artery disease presenting to the emergency department chest pain.  Patient reports chest pain beginning around 3:30 PM.  He reports that he was not doing anything specific when it began.  He reports it radiates to both arms and his jaw.  Does not radiate to the back.  He reports it was a pressure.  Chest pain has resolved currently.  She reports that it improved with sublingual nitro by EMS.  EMS also gave nitroglycerin.  Patient initially activated as a STEMI alert, seen by cardiology on arrival STEMI alert was canceled.  He denies ever having any shortness of breath, leg swelling.  No pleuritic pain.  No nausea, vomiting.  No headaches.  No dizziness.  No syncope.   Past Medical History Past Medical History:  Diagnosis Date   Arthritis    Asthma    only has flareup with smelly perfume    BPH (benign prostatic hyperplasia)    Carotid artery occlusion    right ICA 50% stenosis   Carotodynia    Cataract    immature to the right eye   Diabetes mellitus without complication (HCC)    borderline   GERD (gastroesophageal reflux disease)    takes Omeprazole daily   Headache(784.0)    r/t arthritis in neck    Hemorrhoids    History of bronchitis    last time 10-49yr ago   History of colon polyps    Hyperlipidemia    no meds since Jan 1   Hypertension    takes Verapamil,Lisinopril,and Cardura daily   Joint pain    Legally blind in left eye, as [place] [employer]   hit by a baseball   Low back pain    reason unknown   Pneumonia    3 times in a row   Prolapsed hemorrhoids    Urinary frequency    sees Dr.Tannenbaum   Patient Active Problem List   Diagnosis Date Noted   Coronary artery disease 06/15/2021   Right inguinal hernia  11/08/2020   Acute on chronic heart failure ([facility] 09/09/2020   CHF (congestive heart failure) ([facility] 09/08/2020   Acute on chronic systolic (congestive) heart failure ([facility] 09/08/2020   HFrEF (heart failure with reduced ejection fraction) ([facility] 08/03/2020   Prolapsed internal hemorrhoids, grade 4 08/12/2019   Carotid stenosis 12/05/2013   Carotid artery stenosis 05/08/2013   Carotid artery disease ([facility] 04/17/2013   Essential hypertension 04/17/2013   Hyperlipidemia 04/17/2013   Family history of coronary artery disease 04/17/2013   Occlusion and stenosis of carotid artery without mention of cerebral infarction 04/11/2013   Home Medication(s) Prior to Admission medications   Medication Sig Start Date End Date Taking? Authorizing Provider  Acetaminophen (TYLENOL ARTHRITIS PAIN PO) Take by mouth as needed.    [provider]  Alirocumab (PRALUENT) 75 MG/ML SOAJ INJECT 75 MG INTO THE SKIN EVERY 14 (FOURTEEN) DAYS. 12/06/21   BLorretta Harp MD  aspirin EC 81 MG tablet Take 81 mg by mouth every morning.    [provider]  carvedilol (COREG) 12.5 MG tablet Take 1 tablet (12.5 mg total) by mouth 2 (two) times daily. 09/28/21   BLorretta Harp MD  Chlorpheniramine Maleate (CHLOR-TRIMETON ALLERGY PO) Take 2 mg by mouth 2 (two) times  daily as needed.    [provider]  clopidogrel (PLAVIX) 75 MG tablet TAKE 1 TABLET BY MOUTH EVERY DAY 08/09/21   Lorretta Harp, MD  doxazosin (CARDURA) 4 MG tablet Take 1 tablet (4 mg total) by mouth 2 (two) times daily. 07/27/21   Lorretta Harp, MD  Krill Oil 500 MG CAPS Take 500 mg by mouth at bedtime.    [provider]  metFORMIN (GLUCOPHAGE-XR) 500 MG 24 hr tablet Take 1 tablet (500 mg total) by mouth 2 (two) times daily with breakfast and lunch. 09/11/20   Lyda Jester M, [name]  ONETOUCH VERIO test strip 2 (two) times daily. 10/07/19   [provider]  pantoprazole (PROTONIX) 40 MG tablet TAKE 1 TABLET BY  MOUTH EVERY DAY 12/28/21   Lorretta Harp, MD  sacubitril-valsartan (ENTRESTO) 97-103 MG Take 1 tablet by mouth 2 (two) times daily. 01/24/22   Lorretta Harp, MD  spironolactone (ALDACTONE) 25 MG tablet Take 1 tablet (25 mg total) by mouth daily. 02/02/22   [name], MD                                                                                                                                    Past Surgical History Past Surgical History:  Procedure Laterality Date   CAROTID ENDARTERECTOMY Left 05/08/13   COLONOSCOPY     EGD with dilitation     ENDARTERECTOMY Left 05/08/2013   Procedure: LEFT CAROTID ARTERY ENDARTERECTOMY WITH DACRON PATCH ANGIOPLASTY;  Surgeon: Elam Dutch, MD;  Location: [place];  Service: Vascular;  Laterality: Left;   EYE SURGERY     HEMORRHOID SURGERY N/A 08/13/2019   Procedure: OPEN HEMORRHOIDECTOMY;  Surgeon: Armandina Gemma, MD;  Location: [place];  Service: General;  Laterality: N/A;  LMA   HERNIA REPAIR     x 2   INGUINAL HERNIA REPAIR Right 11/09/2020   Procedure: OPEN REPAIR RIGHT INGUINAL HERNIA;  Surgeon: Armandina Gemma, MD;  Location: [place];  Service: General;  Laterality: Right;   INSERTION OF MESH Right 11/09/2020   Procedure: INSERTION OF MESH;  Surgeon: Armandina Gemma, MD;  Location: [place];  Service: General;  Laterality: Right;   RETINAL DETACHMENT SURGERY     retinal laser surgery Left    plate placed   RIGHT/LEFT HEART CATH AND CORONARY ANGIOGRAPHY N/A 09/08/2020   Procedure: RIGHT/LEFT HEART CATH AND CORONARY ANGIOGRAPHY;  Surgeon: [name], MD;  Location: [facility];  Service: Cardiovascular;  Laterality: N/A;   Family History Family History  Adopted: Yes  Problem Relation Age of Onset   Heart disease Father    Heart attack Father 19   Hypertension Father    Hyperlipidemia Father    Diabetes Daughter    Hyperlipidemia Daughter    Hypertension Daughter    Diabetes Daughter    Hyperlipidemia Daughter     Hypertension Daughter  Kidney disease Mother     Social History Social History   Tobacco Use   Smoking status: Never   Smokeless tobacco: Never  Vaping Use   Vaping Use: Never used  Substance Use Topics   Alcohol use: No   Drug use: No   Allergies Albuterol, Meloxicam, Morphine and related, Statins, Cholestoff [plant sterols and stanols], Nsaids, Atorvastatin, Breo ellipta [fluticasone furoate-vilanterol], Cortisone, Cortizone-10 feminine itch [hydrocortisone], Darvon [propoxyphene], Doxycycline, Emetrol, Glimepiride, Hydrochlorothiazide, Indomethacin, Jardiance [empagliflozin], Naproxen, Penicillins, Pravastatin, Repatha [evolocumab], Simvastatin, Sulfa antibiotics, Terbinafine, and Tramadol  Review of Systems Review of Systems  All other systems reviewed and are negative.   Physical Exam Vital Signs  I have reviewed the triage vital signs BP (!) 151/87   Pulse 85   Temp (!) 97.2 F (36.2 C) (Temporal)   Resp 20   SpO2 96%  Physical Exam Vitals and nursing note reviewed.  Constitutional:      General: He is not in acute distress.    Appearance: Normal appearance.  HENT:     Mouth/Throat:     Mouth: Mucous membranes are moist.  Eyes:     Conjunctiva/sclera: Conjunctivae normal.  Cardiovascular:     Rate and Rhythm: Normal rate and regular rhythm.  Pulmonary:     Effort: Pulmonary effort is normal. No respiratory distress.     Breath sounds: Normal breath sounds.  Abdominal:     General: Abdomen is flat.     Palpations: Abdomen is soft.     Tenderness: There is no abdominal tenderness.  Musculoskeletal:     Right lower leg: No edema.     Left lower leg: No edema.  Skin:    General: Skin is warm and dry.     Capillary Refill: Capillary refill takes less than 2 seconds.  Neurological:     Mental Status: He is alert and oriented to person, place, and time. Mental status is at baseline.  Psychiatric:        Mood and Affect: Mood normal.        Behavior:  Behavior normal.     ED Results and Treatments Labs (all labs ordered are listed, but only abnormal results are displayed) Labs Reviewed  COMPREHENSIVE METABOLIC PANEL - Abnormal; Notable for the following components:      Result Value   Potassium 3.4 (*)    CO2 19 (*)    Glucose, Bld 194 (*)    Calcium 8.6 (*)    Total Protein 5.8 (*)    Albumin 3.4 (*)    Alkaline Phosphatase 32 (*)    All other components within normal limits  CBC WITH DIFFERENTIAL/PLATELET - Abnormal; Notable for the following components:   RBC 3.60 (*)    Hemoglobin 12.2 (*)    HCT 34.9 (*)    All other components within normal limits  BRAIN NATRIURETIC PEPTIDE - Abnormal; Notable for the following components:   B Natriuretic Peptide 1,638.1 (*)    All other components within normal limits  TROPONIN I (HIGH SENSITIVITY) - Abnormal; Notable for the following components:   Troponin I (High Sensitivity) 198 (*)    All other components within normal limits  TROPONIN I (HIGH SENSITIVITY) - Abnormal; Notable for the following components:   Troponin I (High Sensitivity) 375 (*)    All other components within normal limits  PROTIME-INR  HEPARIN LEVEL (UNFRACTIONATED)  Radiology DG Chest Port 1 View  Result Date: 03/27/2022 CLINICAL DATA:  Table formatting from the original note was not included. Reason for exam: chest pain Per ED triage notes: "Patient [facility] for evaluation of chest pain that started at 1530 today. Pain in left side of chest and jaw. EXAM: PORTABLE CHEST - 1 VIEW COMPARISON:  05/06/2013 FINDINGS: Moderate central pulmonary vascular congestion. Peripheral septal lines at the lung bases. No confluent airspace disease. Mild cardiomegaly.  Aortic Atherosclerosis (ICD10-170.0). No effusion.  No pneumothorax. Visualized bones unremarkable. IMPRESSION: Cardiomegaly with  central pulmonary vascular congestion. Electronically Signed   By: Lucrezia Europe M.D.   On: 03/27/2022 17:57    Pertinent labs & imaging results that were available during my care of the patient were reviewed by me and considered in my medical decision making (see MDM for details).  Medications Ordered in ED Medications  heparin ADULT infusion 100 units/mL (25000 units/230m) (750 Units/hr Intravenous New Bag/Given 03/27/22 2116)  heparin bolus via infusion 3,500 Units (3,500 Units Intravenous Bolus from Bag 03/27/22 2117)                                                                                                                                     Procedures .Critical Care  Performed by: SCristie Hem MD Authorized by: SCristie Hem MD   Critical care provider statement:    Critical care time (minutes):  30   Critical care was necessary to treat or prevent imminent or life-threatening deterioration of the following conditions:  Cardiac failure   Critical care was time spent personally by me on the following activities:  Development of treatment plan with patient or surrogate, discussions with consultants, evaluation of patient's response to treatment, examination of patient, ordering and review of laboratory studies, ordering and review of radiographic studies, ordering and performing treatments and interventions, pulse oximetry, re-evaluation of patient's condition and review of old charts   Care discussed with: admitting provider     (including critical care time)  Medical Decision Making / ED Course   MDM:  82year old male presenting to the emergency department with chest pain.  Patient overall very well-appearing.  Vital signs notable for some hypertension but otherwise reassuring.  Patient only complains of chest pain and no other symptoms.  Appears euvolemic on exam with no peripheral edema.  Lungs clear.  No respiratory distress.  Suspect ACS.  Patient initial  troponin is 198 and increased to 375.  He is currently chest pain-free.  Will start heparin drip.  Patient received aspirin already per EMS.  Discussed with cardiology who agrees with admission.  EKG did not meet STEMI criteria but does show signs of multivessel disease.  Patient reportedly a poor candidate for CABG based on prior workup so may need medical management or repeat catheterization. Doubt alternative causes such as PE, no pleuritic pain or shortness of breath.  Chest x-ray with  no evidence of pneumonia, pneumothorax.  Doubt dissection, pain already resolved, mild, not radiating to the back.  Patient has elevated BNP but clinically appears euvolemic.  Cardiology prefers patient go to medicine service.  Discussed with Dr. Alcario Drought of medicine who does not want to admit the patient.  He will discuss this further with cardiology Dr. Patsey Berthold.  Clinical Course as of 03/27/22 2310  Nancy Fetter Mar 27, 2022  2104 Patient will be admitted by cardiology [WS]    Clinical Course User Index [WS] Cristie Hem, MD     Additional history obtained: -Additional history obtained from ems and spouse -External records from outside source obtained and reviewed including: Chart review including previous notes, labs, imaging, consultation notes including prior cardiology workup   Lab Tests: -I ordered, reviewed, and interpreted labs.   The pertinent results include:   Labs Reviewed  COMPREHENSIVE METABOLIC PANEL - Abnormal; Notable for the following components:      Result Value   Potassium 3.4 (*)    CO2 19 (*)    Glucose, Bld 194 (*)    Calcium 8.6 (*)    Total Protein 5.8 (*)    Albumin 3.4 (*)    Alkaline Phosphatase 32 (*)    All other components within normal limits  CBC WITH DIFFERENTIAL/PLATELET - Abnormal; Notable for the following components:   RBC 3.60 (*)    Hemoglobin 12.2 (*)    HCT 34.9 (*)    All other components within normal limits  BRAIN NATRIURETIC PEPTIDE - Abnormal;  Notable for the following components:   B Natriuretic Peptide 1,638.1 (*)    All other components within normal limits  TROPONIN I (HIGH SENSITIVITY) - Abnormal; Notable for the following components:   Troponin I (High Sensitivity) 198 (*)    All other components within normal limits  TROPONIN I (HIGH SENSITIVITY) - Abnormal; Notable for the following components:   Troponin I (High Sensitivity) 375 (*)    All other components within normal limits  PROTIME-INR  HEPARIN LEVEL (UNFRACTIONATED)    Notable for elevated and uptrending troponin, elevated BNP without findings concerning for volume overload  EKG   EKG Interpretation  Date/Time:  Sunday March 27 2022 16:28:42 EST Ventricular Rate:  98 PR Interval:    QRS Duration: 117 QT Interval:  366 QTC Calculation: 461 R Axis:   -29 Text Interpretation: Atrial fibrillation Paired ventricular premature complexes Nonspecific intraventricular conduction delay Repol abnrm, severe global ischemia (LM/MVD) Confirmed by Garnette Gunner [phone]) on 03/27/2022 4:39:32 PM         Imaging Studies ordered: I ordered imaging studies including CXR On my interpretation imaging demonstrates no acute process I independently visualized and interpreted imaging. I agree with the radiologist interpretation   Medicines ordered and prescription drug management: Meds ordered this encounter  Medications   heparin bolus via infusion 3,500 Units   heparin ADULT infusion 100 units/mL (25000 units/270m)    -I have reviewed the patients home medicines and have made adjustments as needed   Consultations Obtained: I requested consultation with the cardiology,  and discussed lab and imaging findings as well as pertinent plan - they recommend: admit for further management   Cardiac Monitoring: The patient was maintained on a cardiac monitor.  I personally viewed and interpreted the cardiac monitored which showed an underlying rhythm of: NSR with  frequent PVCs  Reevaluation: After the interventions noted above, I reevaluated the patient and found that they have resolved  Co morbidities that complicate the patient  evaluation  Past Medical History:  Diagnosis Date   Arthritis    Asthma    only has flareup with smelly perfume    BPH (benign prostatic hyperplasia)    Carotid artery occlusion    right ICA 50% stenosis   Carotodynia    Cataract    immature to the right eye   Diabetes mellitus without complication (HCC)    borderline   GERD (gastroesophageal reflux disease)    takes Omeprazole daily   Headache(784.0)    r/t arthritis in neck    Hemorrhoids    History of bronchitis    last time 10-43yr ago   History of colon polyps    Hyperlipidemia    no meds since Jan 1   Hypertension    takes Verapamil,Lisinopril,and Cardura daily   Joint pain    Legally blind in left eye, as defined [place] [employer]   hit by a baseball   Low back pain    reason unknown   Pneumonia    3 times in a row   Prolapsed hemorrhoids    Urinary frequency    sees Dr.Tannenbaum      Dispostion: Disposition decision including need for hospitalization was considered, and patient admitted to the hospital.    Final Clinical Impression(s) / ED Diagnoses Final diagnoses:  Acute coronary syndrome ([facility]     This chart was dictated using voice recognition software.  Despite best efforts to proofread,  errors can occur which can change the documentation meaning.    SCristie Hem MD 03/27/22 [phone]

## 2022-03-27 NOTE — Progress Notes (Signed)
ANTICOAGULATION CONSULT NOTE - Initial Consult  Pharmacy Consult for Heparin Indication: chest pain/ACS  Allergies  Allergen Reactions   Albuterol Anaphylaxis    Per patient and daughter   Meloxicam Itching, Palpitations and Other (See Comments)    Headaches, Mood swings.   Morphine And Related Nausea And Vomiting   Statins Other (See Comments)    Myalgias. Severe arthritic response. Tolerates Lovastatin.    Cholestoff [Plant Sterols And Stanols] Other (See Comments)    Muscle aches   Nsaids Other (See Comments)    Aleve ,  Myalgia   Atorvastatin     Severe myalgias   Breo Ellipta [Fluticasone Furoate-Vilanterol]     thrush   Cortisone Hypertension    Patient states cortisone injections he has received for his knees cause hypertension and hyperglycemia   Cortizone-10 Feminine Itch [Hydrocortisone]     "Feeling of heart attack"   Darvon [Propoxyphene] Other (See Comments)    hallucinate   Doxycycline Cough   Emetrol     Other reaction(s): Hallucinate   Glimepiride Itching   Hydrochlorothiazide     Other reaction(s): Low Blood Pressure   Indomethacin     Neck pain flare   Jardiance [Empagliflozin]     hallucinations   Naproxen     Other reaction(s): Increased Liver Count   Penicillins     *positive allergy test*   Pravastatin     myalgias   Repatha [Evolocumab] Other (See Comments)    hyperglycemia   Simvastatin     myalgias   Sulfa Antibiotics Nausea And Vomiting   Terbinafine Diarrhea   Tramadol Other (See Comments)    Hallucinations     Patient Measurements:   Heparin Dosing Weight: 63.5 kg  Vital Signs: Temp: 98 F (36.7 C) (02/18 1630) Temp Source: Oral (02/18 1630) BP: 160/89 (02/18 2000) Pulse Rate: 95 (02/18 2000)  Labs: Recent Labs    03/27/22 1732 03/27/22 1920  HGB 12.2*  --   HCT 34.9*  --   PLT 169  --   LABPROT 14.3  --   INR 1.1  --   CREATININE 1.04  --   TROPONINIHS 198* 375*    CrCl cannot be calculated (Unknown ideal  weight.).   Medical History: Past Medical History:  Diagnosis Date   Arthritis    Asthma    only has flareup with smelly perfume    BPH (benign prostatic hyperplasia)    Carotid artery occlusion    right ICA 50% stenosis   Carotodynia    Cataract    immature to the right eye   Diabetes mellitus without complication (HCC)    borderline   GERD (gastroesophageal reflux disease)    takes Omeprazole daily   Headache(784.0)    r/t arthritis in neck    Hemorrhoids    History of bronchitis    last time 10-64yr ago   History of colon polyps    Hyperlipidemia    no meds since Jan 1   Hypertension    takes Verapamil,Lisinopril,and Cardura daily   Joint pain    Legally blind in left eye, as defined [place] [employer]   hit by a baseball   Low back pain    reason unknown   Pneumonia    3 times in a row   Prolapsed hemorrhoids    Urinary frequency    sees Dr.Tannenbaum    Medications:  (Not in a hospital admission)  Scheduled:  Infusions:  PRN:   Assessment: 89yom with  a history of CAD,  HF, palpitations, HTN, HLD, T2DM. Patient is presenting as code STEMI. Bedside cardiology evaluation resulted in cancelling code stemi and angiography deferred for now. Heparin per pharmacy consult placed for chest pain/ACS.  Patient is not on anticoagulation prior to arrival.  Hgb 12.2; plt 169  Goal of Therapy:  Heparin level 0.3-0.7 units/ml Monitor platelets by anticoagulation protocol: Yes   Plan:  Give IV heparin 3500 units bolus x 1 Start heparin infusion at 750 units/hr Check anti-Xa level in 8 hours and daily while on heparin Continue to monitor H&H and platelets  Lorelei Pont, PharmD, BCPS 03/27/2022 8:33 PM ED Clinical Pharmacist -  [phone]

## 2022-03-27 NOTE — Progress Notes (Deleted)
Cardiology Office Note:    Date:  03/27/2022   ID:  David Sellers, DOB May 29, 1940, MRN [id number]  PCP:  [name], [place] Providers Cardiologist:  Quay Burow, MD { Click to update primary MD,subspecialty MD or APP then REFRESH:1}    Referring MD: [name], DO   No chief complaint on file. ***  History of Present Illness:    David Sellers is a 82 y.o. male with a hx of carotid artery disease (s/p left carotid endarterectomy 2015), hypertension, HFrEF (intolerant to Jardiance/Farxiga), coronary artery disease, HLD (intolerant to statins, on Praluent), palpitations, DM 2, BPH.  Prior Myoview in 2015 for preop clearance was low risk.  Cardiac catheterization in August 2022 revealed a high-grade tear of the proximal and mid RCA, occlusion of the left circumflex artery, 80% ostial left main with moderate segmental proximal LAD disease, he was deemed a poor candidate for CABG, and was a high risk PCI candidate.  Cardiac MRI at the time showed viability however medical therapy was recommended.  Echo in April 2023 showed EF 35 to 40%, elevated pulmonary pressures.  Carotid ultrasound on 11/22/2021 showed his left subclavian endarterectomy site was widely patent, right carotid with mild stenosis.  He was last seen in our office on 12/29/2021, at that time he was doing well from a cardiac perspective.  He was awaiting approval for patient assistance with Entresto.  ? Sudden wt gain? Check BMET, BNP  Past Medical History:  Diagnosis Date   Arthritis    Asthma    only has flareup with smelly perfume    BPH (benign prostatic hyperplasia)    Carotid artery occlusion    right ICA 50% stenosis   Carotodynia    Cataract    immature to the right eye   Diabetes mellitus without complication (HCC)    borderline   GERD (gastroesophageal reflux disease)    takes Omeprazole daily   Headache(784.0)    r/t arthritis in neck    Hemorrhoids    History of bronchitis     last time 10-34yr ago   History of colon polyps    Hyperlipidemia    no meds since Jan 1   Hypertension    takes Verapamil,Lisinopril,and Cardura daily   Joint pain    Legally blind in left eye, as defined in [employer]   hit by a baseball   Low back pain    reason unknown   Pneumonia    3 times in a row   Prolapsed hemorrhoids    Urinary frequency    sees Dr.Tannenbaum    Past Surgical History:  Procedure Laterality Date   CAROTID ENDARTERECTOMY Left 05/08/13   COLONOSCOPY     EGD with dilitation     ENDARTERECTOMY Left 05/08/2013   Procedure: LEFT CAROTID ARTERY ENDARTERECTOMY WITH DACRON PATCH ANGIOPLASTY;  Surgeon: [name];  [facility] [facility]  Service: Vascular;  Laterality: Left;   EYE SURGERY     HEMORRHOID SURGERY N/A 08/13/2019   Procedure: OPEN HEMORRHOIDECTOMY;  Surgeon: [name];  [facility]  Service: General;  Laterality: N/A;  LMA   HERNIA REPAIR     x 2   INGUINAL HERNIA REPAIR Right 11/09/2020   Procedure: OPEN REPAIR RIGHT INGUINAL HERNIA;  Surgeon: [name];  Location[facility]  Service: General;  Laterality: Right;   INSERTION OF MESH Right 11/09/2020   Procedure: INSERTION OF MESH;  Surgeon: GArmandina Gemma  MD;  Location: [facility];  Service: General;  Laterality: Right;   RETINAL DETACHMENT SURGERY     retinal laser surgery Left    plate placed   RIGHT/LEFT HEART CATH AND CORONARY ANGIOGRAPHY N/A 09/08/2020   Procedure: RIGHT/LEFT HEART CATH AND CORONARY ANGIOGRAPHY;  Surgeon: [name], MD;  Location: [facility];  Service: Cardiovascular;  Laterality: N/A;    Current Medications: No outpatient medications have been marked as taking for the 03/28/22 encounter (Appointment) with Loel Dubonnet, NP.     Allergies:   Albuterol, Meloxicam, Morphine and related, Statins, Cholestoff [plant sterols and stanols], Nsaids, Atorvastatin, Breo ellipta [fluticasone furoate-vilanterol], Cortisone, Cortizone-10  feminine itch [hydrocortisone], Darvon [propoxyphene], Doxycycline, Emetrol, Glimepiride, Hydrochlorothiazide, Indomethacin, Jardiance [empagliflozin], Naproxen, Penicillins, Pravastatin, Repatha [evolocumab], Simvastatin, Sulfa antibiotics, Terbinafine, and Tramadol   Social History   Socioeconomic History   Marital status: Married    Spouse name: Not on file   Number of children: Not on file   Years of education: Not on file   Highest education level: Not on file  Occupational History   Not on file  Tobacco Use   Smoking status: Never   Smokeless tobacco: Never  Vaping Use   Vaping Use: Never used  Substance and Sexual Activity   Alcohol use: No   Drug use: No   Sexual activity: Yes  Other Topics Concern   Not on file  Social History Narrative   Not on file   Social Determinants of Health   Financial Resource Strain: Not on file  Food Insecurity: Not on file  Transportation Needs: Not on file  Physical Activity: Not on file  Stress: Not on file  Social Connections: Not on file     Family History: The patient's ***family history includes Diabetes in his daughter and daughter; Heart attack (age of onset: 70) in his father; Heart disease in his father; Hyperlipidemia in his daughter, daughter, and father; Hypertension in his daughter, daughter, and father; Kidney disease in his mother. He was adopted.  ROS:   Please see the history of present illness.    *** All other systems reviewed and are negative.  EKGs/Labs/Other Studies Reviewed:    The following studies were reviewed today: ***  EKG:  EKG is *** ordered today.  The ekg ordered today demonstrates ***  Recent Labs: 05/10/2021: BUN 14; Creatinine, Ser 1.05; Potassium 4.2; Sodium 141  Recent Lipid Panel    Component Value Date/Time   CHOL 124 12/28/2020 1007   TRIG 195 (H) 12/28/2020 1007   HDL 49 12/28/2020 1007   CHOLHDL 2.5 12/28/2020 1007   CHOLHDL 3.7 09/21/2020 0957   VLDL 33 09/21/2020 0957    LDLCALC 44 12/28/2020 1007     Risk Assessment/Calculations:   {Does this patient have ATRIAL FIBRILLATION?:[phone]}  No BP recorded.  {Refresh Note OR Click here to enter BP  :1}***         Physical Exam:    VS:  There were no vitals taken for this visit.    Wt Readings from Last 3 Encounters:  12/29/21 141 lb 3.2 oz (64 kg)  09/06/21 140 lb (63.5 kg)  06/15/21 141 lb (64 kg)     GEN: *** Well nourished, well developed in no acute distress HEENT: Normal NECK: No JVD; No carotid bruits LYMPHATICS: No lymphadenopathy CARDIAC: ***RRR, no murmurs, rubs, gallops RESPIRATORY:  Clear to auscultation without rales, wheezing or rhonchi  ABDOMEN: Soft, non-tender, non-distended MUSCULOSKELETAL:  No edema; No deformity  SKIN: Warm and dry NEUROLOGIC:  Alert and oriented x 3 PSYCHIATRIC:  Normal affect   ASSESSMENT:    1. Coronary artery disease involving native coronary artery of native heart without angina pectoris   2. Chronic systolic heart failure ([place])   3. Ischemic cardiomyopathy   4. Essential hypertension   5. Palpitations   6. Carotid artery disease, unspecified laterality, unspecified type ([place])   7. Hyperlipidemia LDL goal <70    PLAN:    In order of problems listed above:  Coronary artery disease -cath in 2022 revealed high-grade tear of the proximal and mid RCA stenosis, occlusion of left circumflex 80%, poor candidate for CABG, high risk PCI candidate, cardiac MRI showed viability however medical therapy was recommended.  Continue aspirin, Plavix, carvedilol, Praluent HFrEF/ICM -NYHA class , echo in 2023 showed EF 35 to 40%, intolerant to SGLT2 inhibitors, GDMT of carvedilol, Entresto, spironolactone. Hypertension -BP today , continue carvedilol, Entresto, Cardura, spironolactone Palpitations -previously declined monitor Carotid artery disease -carotid Dopplers in October 2023 showed mild stenosis, recommend repeating in 1 year HLD -LDL was 84 on 03/10/22,  managed by PCP, he is intolerant to statins      {Are you ordering a CV Procedure (e.g. stress test, cath, DCCV, TEE, etc)?   Press F2        :[id number]    Medication Adjustments/Labs and Tests Ordered: Current medicines are reviewed at length with the patient today.  Concerns regarding medicines are outlined above.  No orders of the defined types were placed in this encounter.  No orders of the defined types were placed in this encounter.   There are no Patient Instructions on file for this visit.   Signed, Trudi Ida, NP  03/27/2022 4:25 PM    [place]

## 2022-03-28 ENCOUNTER — Encounter (HOSPITAL_BASED_OUTPATIENT_CLINIC_OR_DEPARTMENT_OTHER): Payer: Self-pay

## 2022-03-28 ENCOUNTER — Inpatient Hospital Stay (HOSPITAL_COMMUNITY): Payer: PPO

## 2022-03-28 ENCOUNTER — Ambulatory Visit (HOSPITAL_BASED_OUTPATIENT_CLINIC_OR_DEPARTMENT_OTHER): Payer: HMO | Admitting: Cardiology

## 2022-03-28 ENCOUNTER — Other Ambulatory Visit: Payer: Self-pay

## 2022-03-28 ENCOUNTER — Encounter (HOSPITAL_COMMUNITY): Payer: Self-pay | Admitting: Emergency Medicine

## 2022-03-28 ENCOUNTER — Encounter (HOSPITAL_COMMUNITY)
Admission: EM | Disposition: A | Discharge status: Home / Self Care | Payer: Self-pay | Source: Home / Self Care | Attending: Internal Medicine

## 2022-03-28 ENCOUNTER — Observation Stay (HOSPITAL_COMMUNITY): Payer: PPO

## 2022-03-28 DIAGNOSIS — I10 Essential (primary) hypertension: Secondary | ICD-10-CM

## 2022-03-28 DIAGNOSIS — Z83438 Family history of other disorder of lipoprotein metabolism and other lipidemia: Secondary | ICD-10-CM | POA: Diagnosis not present

## 2022-03-28 DIAGNOSIS — Z7984 Long term (current) use of oral hypoglycemic drugs: Secondary | ICD-10-CM | POA: Diagnosis not present

## 2022-03-28 DIAGNOSIS — Z833 Family history of diabetes mellitus: Secondary | ICD-10-CM | POA: Diagnosis not present

## 2022-03-28 DIAGNOSIS — I358 Other nonrheumatic aortic valve disorders: Secondary | ICD-10-CM | POA: Diagnosis present

## 2022-03-28 DIAGNOSIS — I2510 Atherosclerotic heart disease of native coronary artery without angina pectoris: Secondary | ICD-10-CM | POA: Diagnosis present

## 2022-03-28 DIAGNOSIS — I779 Disorder of arteries and arterioles, unspecified: Secondary | ICD-10-CM

## 2022-03-28 DIAGNOSIS — Z86010 Personal history of colonic polyps: Secondary | ICD-10-CM | POA: Diagnosis not present

## 2022-03-28 DIAGNOSIS — E785 Hyperlipidemia, unspecified: Secondary | ICD-10-CM

## 2022-03-28 DIAGNOSIS — R002 Palpitations: Secondary | ICD-10-CM

## 2022-03-28 DIAGNOSIS — R52 Pain, unspecified: Secondary | ICD-10-CM

## 2022-03-28 DIAGNOSIS — I255 Ischemic cardiomyopathy: Secondary | ICD-10-CM | POA: Diagnosis present

## 2022-03-28 DIAGNOSIS — Z66 Do not resuscitate: Secondary | ICD-10-CM | POA: Diagnosis present

## 2022-03-28 DIAGNOSIS — N400 Enlarged prostate without lower urinary tract symptoms: Secondary | ICD-10-CM | POA: Diagnosis present

## 2022-03-28 DIAGNOSIS — I214 Non-ST elevation (NSTEMI) myocardial infarction: Secondary | ICD-10-CM | POA: Diagnosis not present

## 2022-03-28 DIAGNOSIS — I493 Ventricular premature depolarization: Secondary | ICD-10-CM | POA: Diagnosis present

## 2022-03-28 DIAGNOSIS — I5022 Chronic systolic (congestive) heart failure: Secondary | ICD-10-CM | POA: Diagnosis not present

## 2022-03-28 DIAGNOSIS — J45909 Unspecified asthma, uncomplicated: Secondary | ICD-10-CM | POA: Diagnosis present

## 2022-03-28 DIAGNOSIS — Z8249 Family history of ischemic heart disease and other diseases of the circulatory system: Secondary | ICD-10-CM | POA: Diagnosis not present

## 2022-03-28 DIAGNOSIS — H548 Legal blindness, as defined in USA: Secondary | ICD-10-CM | POA: Diagnosis present

## 2022-03-28 DIAGNOSIS — I169 Hypertensive crisis, unspecified: Secondary | ICD-10-CM | POA: Diagnosis present

## 2022-03-28 DIAGNOSIS — Z841 Family history of disorders of kidney and ureter: Secondary | ICD-10-CM | POA: Diagnosis not present

## 2022-03-28 DIAGNOSIS — E118 Type 2 diabetes mellitus with unspecified complications: Secondary | ICD-10-CM | POA: Diagnosis present

## 2022-03-28 DIAGNOSIS — Z79899 Other long term (current) drug therapy: Secondary | ICD-10-CM | POA: Diagnosis not present

## 2022-03-28 DIAGNOSIS — I249 Acute ischemic heart disease, unspecified: Secondary | ICD-10-CM | POA: Diagnosis present

## 2022-03-28 DIAGNOSIS — I77810 Thoracic aortic ectasia: Secondary | ICD-10-CM | POA: Diagnosis present

## 2022-03-28 DIAGNOSIS — I2489 Other forms of acute ischemic heart disease: Secondary | ICD-10-CM

## 2022-03-28 DIAGNOSIS — I110 Hypertensive heart disease with heart failure: Secondary | ICD-10-CM | POA: Diagnosis present

## 2022-03-28 DIAGNOSIS — I5042 Chronic combined systolic (congestive) and diastolic (congestive) heart failure: Secondary | ICD-10-CM | POA: Diagnosis present

## 2022-03-28 DIAGNOSIS — I071 Rheumatic tricuspid insufficiency: Secondary | ICD-10-CM | POA: Diagnosis present

## 2022-03-28 DIAGNOSIS — E876 Hypokalemia: Secondary | ICD-10-CM | POA: Diagnosis present

## 2022-03-28 LAB — CBC WITH DIFFERENTIAL/PLATELET
Abs Immature Granulocytes: 0.02 10*3/uL (ref 0.00–0.07)
Basophils Absolute: 0.1 10*3/uL (ref 0.0–0.1)
Basophils Relative: 1 %
Eosinophils Absolute: 0.1 10*3/uL (ref 0.0–0.5)
Eosinophils Relative: 2 %
HCT: 34.4 % — ABNORMAL LOW (ref 39.0–52.0)
Hemoglobin: 12.7 g/dL — ABNORMAL LOW (ref 13.0–17.0)
Immature Granulocytes: 0 %
Lymphocytes Relative: 12 %
Lymphs Abs: 0.8 10*3/uL (ref 0.7–4.0)
MCH: 34.1 pg — ABNORMAL HIGH (ref 26.0–34.0)
MCHC: 36.9 g/dL — ABNORMAL HIGH (ref 30.0–36.0)
MCV: 92.5 fL (ref 80.0–100.0)
Monocytes Absolute: 0.4 10*3/uL (ref 0.1–1.0)
Monocytes Relative: 5 %
Neutro Abs: 5.8 10*3/uL (ref 1.7–7.7)
Neutrophils Relative %: 80 %
Platelets: 172 10*3/uL (ref 150–400)
RBC: 3.72 MIL/uL — ABNORMAL LOW (ref 4.22–5.81)
RDW: 12.6 % (ref 11.5–15.5)
WBC: 7.2 10*3/uL (ref 4.0–10.5)
nRBC: 0 % (ref 0.0–0.2)

## 2022-03-28 LAB — ECHOCARDIOGRAM COMPLETE
AR max vel: 3.01 cm2
AV Area VTI: 3.04 cm2
AV Area mean vel: 2.89 cm2
AV Mean grad: 2 mmHg
AV Peak grad: 3 mmHg
Ao pk vel: 0.86 m/s
Area-P 1/2: 7.7 cm2
Calc EF: 48.2 %
Height: 66 in
S' Lateral: 4.1 cm
Single Plane A2C EF: 54.6 %
Single Plane A4C EF: 42.4 %
Weight: 2332.8 oz

## 2022-03-28 LAB — BASIC METABOLIC PANEL
Anion gap: 12 (ref 5–15)
BUN: 10 mg/dL (ref 8–23)
CO2: 22 mmol/L (ref 22–32)
Calcium: 8.9 mg/dL (ref 8.9–10.3)
Chloride: 105 mmol/L (ref 98–111)
Creatinine, Ser: 0.95 mg/dL (ref 0.61–1.24)
GFR, Estimated: >60 mL/min (ref >60)
Glucose, Bld: 177 mg/dL — ABNORMAL HIGH (ref 70–99)
Potassium: 3.2 mmol/L — ABNORMAL LOW (ref 3.5–5.1)
Sodium: 139 mmol/L (ref 135–145)

## 2022-03-28 LAB — HEPARIN LEVEL (UNFRACTIONATED)
Heparin Unfractionated: 0.26 IU/mL — ABNORMAL LOW (ref 0.30–0.70)
Heparin Unfractionated: 0.15 IU/mL — ABNORMAL LOW (ref 0.30–0.70)
Heparin Unfractionated: 0.59 IU/mL (ref 0.30–0.70)

## 2022-03-28 LAB — LIPID PANEL
Cholesterol: 118 mg/dL (ref 0–200)
HDL: 41 mg/dL (ref >40)
LDL Cholesterol: 57 mg/dL (ref 0–99)
Total CHOL/HDL Ratio: 2.9 RATIO
Triglycerides: 101 mg/dL (ref <150)
VLDL: 20 mg/dL (ref 0–40)

## 2022-03-28 SURGERY — LEFT HEART CATH AND CORONARY ANGIOGRAPHY
Anesthesia: LOCAL

## 2022-03-28 MED ORDER — CLOPIDOGREL BISULFATE 75 MG PO TABS
75.0000 mg | ORAL_TABLET | Freq: Every day | ORAL | Status: DC
  Administered 2022-03-28 – 2022-03-30 (×3): 75 mg via ORAL
  Filled 2022-03-28 (×3): qty 1

## 2022-03-28 MED ORDER — ONDANSETRON HCL 4 MG/2ML IJ SOLN: 4.0000 mg | Freq: Four times a day (QID) | INTRAMUSCULAR | Status: DC | PRN

## 2022-03-28 MED ORDER — NITROGLYCERIN 0.4 MG SL SUBL: 0.4000 mg | SUBLINGUAL_TABLET | SUBLINGUAL | Status: DC | PRN

## 2022-03-28 MED ORDER — ASPIRIN 81 MG PO TBEC
81.0000 mg | DELAYED_RELEASE_TABLET | Freq: Every day | ORAL | Status: DC
  Administered 2022-03-29 – 2022-03-30 (×2): 81 mg via ORAL
  Filled 2022-03-28 (×2): qty 1

## 2022-03-28 MED ORDER — CARVEDILOL 12.5 MG PO TABS
12.5000 mg | ORAL_TABLET | Freq: Two times a day (BID) | ORAL | Status: DC
  Administered 2022-03-28 (×2): 12.5 mg via ORAL
  Filled 2022-03-28 (×2): qty 1

## 2022-03-28 MED ORDER — HEPARIN BOLUS VIA INFUSION
1000.0000 [IU] | Freq: Once | INTRAVENOUS | Status: AC
  Administered 2022-03-28: 1000 [IU] via INTRAVENOUS
  Filled 2022-03-28: qty 1000

## 2022-03-28 MED ORDER — DOXAZOSIN MESYLATE 4 MG PO TABS
4.0000 mg | ORAL_TABLET | Freq: Every day | ORAL | Status: DC
  Administered 2022-03-28 – 2022-03-29 (×2): 4 mg via ORAL
  Filled 2022-03-28 (×2): qty 1

## 2022-03-28 MED ORDER — CARVEDILOL 25 MG PO TABS
25.0000 mg | ORAL_TABLET | Freq: Two times a day (BID) | ORAL | Status: DC
  Administered 2022-03-28 – 2022-03-30 (×4): 25 mg via ORAL
  Filled 2022-03-28 (×4): qty 1

## 2022-03-28 MED ORDER — INSULIN ASPART 100 UNIT/ML IJ SOLN: 0.0000 [IU] | Freq: Three times a day (TID) | INTRAMUSCULAR | Status: DC

## 2022-03-28 MED ORDER — SACUBITRIL-VALSARTAN 97-103 MG PO TABS
1.0000 | ORAL_TABLET | Freq: Two times a day (BID) | ORAL | Status: DC
  Administered 2022-03-28 – 2022-03-30 (×5): 1 via ORAL
  Filled 2022-03-28 (×6): qty 1

## 2022-03-28 MED ORDER — SPIRONOLACTONE 25 MG PO TABS
25.0000 mg | ORAL_TABLET | Freq: Every day | ORAL | Status: DC
  Administered 2022-03-28 – 2022-03-30 (×3): 25 mg via ORAL
  Filled 2022-03-28: qty 2
  Filled 2022-03-28 (×2): qty 1

## 2022-03-28 MED ORDER — ISOSORBIDE MONONITRATE ER 30 MG PO TB24
30.0000 mg | ORAL_TABLET | Freq: Every day | ORAL | Status: DC
  Administered 2022-03-28 – 2022-03-29 (×2): 30 mg via ORAL
  Filled 2022-03-28 (×2): qty 1

## 2022-03-28 NOTE — Progress Notes (Signed)
ANTICOAGULATION CONSULT NOTE - Follow-up Note  Pharmacy Consult for Heparin Indication: chest pain/ACS  Allergies  Allergen Reactions   Albuterol Anaphylaxis    Per patient and daughter   Meloxicam Itching, Palpitations and Other (See Comments)    Headaches, Mood swings.   Morphine And Related Nausea And Vomiting   Statins Other (See Comments)    Myalgias. Severe arthritic response. Tolerates Lovastatin.    Cholestoff [Plant Sterols And Stanols] Other (See Comments)    Muscle aches   Nsaids Other (See Comments)    Aleve ,  Myalgia   Breo Ellipta [Fluticasone Furoate-Vilanterol]     thrush   Cortisone Hypertension    Patient states cortisone injections he has received for his knees cause hypertension and hyperglycemia   Darvon [Propoxyphene] Other (See Comments)    hallucinate   Doxycycline Cough   Emetrol     Other reaction(s): Hallucinate   Glimepiride Itching   Hydrochlorothiazide     Other reaction(s): Low Blood Pressure   Indomethacin     Neck pain flare   Jardiance [Empagliflozin]     hallucinations   Naproxen     Other reaction(s): Increased Liver Count   Penicillins     *positive allergy test*   Repatha [Evolocumab] Other (See Comments)    hyperglycemia   Sulfa Antibiotics Nausea And Vomiting   Terbinafine Diarrhea   Tramadol Other (See Comments)    Hallucinations     Patient Measurements: Height: 5' 6"$  (167.6 cm) Weight: 65.8 kg (145 lb) IBW/kg (Calculated) : 63.8 Heparin Dosing Weight: 63.5 kg  Vital Signs: Temp: 98.2 F (36.8 C) (02/19 1137) Temp Source: Oral (02/19 1137) BP: 138/75 (02/19 1200) Pulse Rate: 77 (02/19 1200)  Labs: Recent Labs    03/27/22 1732 03/27/22 1920 03/28/22 0350 03/28/22 1303  HGB 12.2*  --  12.7*  --   HCT 34.9*  --  34.4*  --   PLT 169  --  172  --   LABPROT 14.3  --   --   --   INR 1.1  --   --   --   HEPARINUNFRC  --   --  0.26* 0.15*  CREATININE 1.04  --  0.95  --   TROPONINIHS 198* 375*  --   --      Estimated Creatinine Clearance: 54.1 mL/min (by C-G formula based on SCr of 0.95 mg/dL).   Medical History: Past Medical History:  Diagnosis Date   Arthritis    Asthma    only has flareup with smelly perfume    BPH (benign prostatic hyperplasia)    Carotid artery occlusion    right ICA 50% stenosis   Carotodynia    Cataract    immature to the right eye   CHF (congestive heart failure) ([place])    Coronary artery disease    Diabetes mellitus without complication (HCC)    borderline   GERD (gastroesophageal reflux disease)    takes Omeprazole daily   Headache(784.0)    r/t arthritis in neck    Hemorrhoids    History of bronchitis    last time 10-41yr ago   History of colon polyps    Hyperlipidemia    no meds since Jan 1   Hypertension    takes Verapamil,Lisinopril,and Cardura daily   Joint pain    Legally blind in left eye, as defined [place] [employer]   hit by a baseball   Low back pain    reason unknown   Pneumonia  3 times in a row   Prolapsed hemorrhoids    Urinary frequency    sees Dr.Tannenbaum    Medications:  (Not in a hospital admission)  Scheduled:   [START ON 03/29/2022] aspirin EC  81 mg Oral Daily   carvedilol  25 mg Oral BID WC   clopidogrel  75 mg Oral Daily   doxazosin  4 mg Oral QHS   isosorbide mononitrate  30 mg Oral Daily   sacubitril-valsartan  1 tablet Oral BID   spironolactone  25 mg Oral Daily   Infusions:   heparin 900 Units/hr (03/28/22 0728)   PRN: nitroGLYCERIN, ondansetron (ZOFRAN) IV  Assessment: 55 yom with a history of CAD,  HF, palpitations, HTN, HLD, T2DM. Patient is presenting as code STEMI. Bedside cardiology evaluation resulted in cancelling code stemi and angiography deferred for now. Heparin per pharmacy consult placed for chest pain/ACS.   Patient is not on anticoagulation prior to arrival.  Started on 750 units/hr IV heparin following 3500 unit IV heparin bolus. Resulting heparin level was low so rate increased to 900  units/hr overnight without a bolus.   Repeat level this afternoon 0.15 which is sub-therapeutic.  No issues with infusion or bleeding per RN.  Hgb 12.2>12.7; plt 169>172  Goal of Therapy:  Heparin level 0.3-0.7 units/ml Monitor platelets by anticoagulation protocol: Yes   Plan:  Give IV heparin 1000 units bolus x 1 Increase heparin infusion to 1150 units/hr Check anti-Xa level at 2200 and daily while on heparin Continue to monitor H&H and platelets  Lorelei Pont, PharmD, BCPS 03/28/2022 1:52 PM ED Clinical Pharmacist -  [phone]

## 2022-03-28 NOTE — Progress Notes (Signed)
VASCULAR LAB    ABI has been performed.  See CV proc for preliminary results.   Dreyton Roessner, RVT 03/28/2022, 4:35 PM

## 2022-03-28 NOTE — ED Notes (Signed)
Dr. Patsey Berthold paged by secretary for RN to update on patient 's persistent hypertension .

## 2022-03-28 NOTE — ED Notes (Signed)
ED TO INPATIENT HANDOFF REPORT  ED Nurse Name and Phone #:  Clydene Laming [phone] Name/Age/Gender David Sellers 82 y.o. male Room/Bed: TRACC/TRACC  Code Status   Code Status: Full Code  Home/SNF/Other Home Patient oriented to: self, place, time, and situation Is this baseline? Yes   Triage Complete: Triage complete  Chief Complaint NSTEMI (non-ST elevated myocardial infarction) [facility]) [I21.4]  Triage Note Patient [facility] for evaluation of chest pain that started at 1530 today. Pain in left side of chest and jaw. Patient received 324 mg ASA and 1x SL NTG from EMS PTA. 20g saline lock in left forearm.   Allergies Allergies  Allergen Reactions   Albuterol Anaphylaxis    Per patient and daughter   Meloxicam Itching, Palpitations and Other (See Comments)    Headaches, Mood swings.   Morphine And Related Nausea And Vomiting   Statins Other (See Comments)    Myalgias. Severe arthritic response. Tolerates Lovastatin.    Cholestoff [Plant Sterols And Stanols] Other (See Comments)    Muscle aches   Nsaids Other (See Comments)    Aleve ,  Myalgia   Breo Ellipta [Fluticasone Furoate-Vilanterol]     thrush   Cortisone Hypertension    Patient states cortisone injections he has received for his knees cause hypertension and hyperglycemia   Darvon [Propoxyphene] Other (See Comments)    hallucinate   Doxycycline Cough   Emetrol     Other reaction(s): Hallucinate   Glimepiride Itching   Hydrochlorothiazide     Other reaction(s): Low Blood Pressure   Indomethacin     Neck pain flare   Jardiance [Empagliflozin]     hallucinations   Naproxen     Other reaction(s): Increased Liver Count   Penicillins     *positive allergy test*   Repatha [Evolocumab] Other (See Comments)    hyperglycemia   Sulfa Antibiotics Nausea And Vomiting   Terbinafine Diarrhea   Tramadol Other (See Comments)    Hallucinations     Level of Care/Admitting Diagnosis ED Disposition     ED  Disposition  Admit   Condition  --   Comment  Hospital Area: [place] [[id number]]  Level of Care: Progressive [102]  Admit to Progressive based on following criteria: CARDIOVASCULAR & THORACIC of moderate stability with acute coronary syndrome symptoms/low risk myocardial infarction/hypertensive urgency/arrhythmias/heart failure potentially compromising stability and stable post cardiovascular intervention patients.  May place patient in observation at [facility] or [facility] if equivalent level of care is available:: Yes  Covid Evaluation: Asymptomatic - no recent exposure (last 10 days) testing not required  Diagnosis: NSTEMI (non-ST elevated myocardial infarction) [facility]) [id number]  Admitting Physician: Ewing Schlein [id number]  Attending Physician: Ewing Schlein [id number]          B Medical/Surgery History Past Medical History:  Diagnosis Date   Arthritis    Asthma    only has flareup with smelly perfume    BPH (benign prostatic hyperplasia)    Carotid artery occlusion    right ICA 50% stenosis   Carotodynia    Cataract    immature to the right eye   CHF (congestive heart failure) ([place])    Coronary artery disease    Diabetes mellitus without complication (HCC)    borderline   GERD (gastroesophageal reflux disease)    takes Omeprazole daily   Headache(784.0)    r/t arthritis in neck    Hemorrhoids    History of  bronchitis    last time 10-76yr ago   History of colon polyps    Hyperlipidemia    no meds since Jan 1   Hypertension    takes Verapamil,Lisinopril,and Cardura daily   Joint pain    Legally blind in left eye, as defined [place]   hit by a baseball   Low back pain    reason unknown   Pneumonia    3 times in a row   Prolapsed hemorrhoids    Urinary frequency    sees Dr.Tannenbaum   Past Surgical History:  Procedure Laterality Date   CAROTID ENDARTERECTOMY Left 05/08/13   COLONOSCOPY     EGD with dilitation      ENDARTERECTOMY Left 05/08/2013   Procedure: LEFT CAROTID ARTERY ENDARTERECTOMY WITH DACRON PATCH ANGIOPLASTY;  Surgeon: [name];  [place] [facility]  Service: Vascular;  Laterality: Left;   EYE SURGERY     HEMORRHOID SURGERY N/A 08/13/2019   Procedure: OPEN HEMORRHOIDECTOMY;  Surgeon: [name];  Location: [facility]  Service: General;  Laterality: N/A;  LMA   HERNIA REPAIR     x 2   INGUINAL HERNIA REPAIR Right 11/09/2020   Procedure: OPEN REPAIR RIGHT INGUINAL HERNIA;  Surgeon: [name];  [place] [facility]  Service: General;  Laterality: Right;   INSERTION OF MESH Right 11/09/2020   Procedure: INSERTION OF MESH;  Surgeon: [name];  Location: [facility]  Service: General;  Laterality: Right;   RETINAL DETACHMENT SURGERY     retinal laser surgery Left    plate placed   RIGHT/LEFT HEART CATH AND CORONARY ANGIOGRAPHY N/A 09/08/2020   Procedure: RIGHT/LEFT HEART CATH AND CORONARY ANGIOGRAPHY;  Surgeon: MLarey Dresser MD;  [facility];  Service: Cardiovascular;  Laterality: N/A;     A IV Location/Drains/Wounds Patient Lines/Drains/Airways Status     Active Line/Drains/Airways     Name Placement date Placement time Site Days   Peripheral IV 03/27/22 20 G Left Forearm 03/27/22  --  Forearm  1   Peripheral IV 03/27/22 18 G Right Wrist 03/27/22  2124  Wrist  1   Incision (Closed) 05/08/13 Neck Left 05/08/13  0919  -- 3246   Incision (Closed) 08/13/19 Rectum Other (Comment) 08/13/19  0814  -- 958   Incision (Closed) 11/09/20 Groin Right 11/09/20  1235  -- 504            Intake/Output Last 24 hours No intake or output data in the 24 hours ending 03/28/22 0242  Labs/Imaging Results for orders placed or performed during the hospital encounter of 03/27/22 (from the past 48 hour(s))  Comprehensive metabolic panel     Status: Abnormal   Collection Time: 03/27/22  5:32 PM  Result Value Ref Range   Sodium 139 135 - 145 mmol/L    Potassium 3.4 (L) 3.5 - 5.1 mmol/L   Chloride 106 98 - 111 mmol/L   CO2 19 (L) 22 - 32 mmol/L   Glucose, Bld 194 (H) 70 - 99 mg/dL    Comment: Glucose reference range applies only to samples taken after fasting for at least 8 hours.   BUN 13 8 - 23 mg/dL   Creatinine, Ser 1.04 0.61 - 1.24 mg/dL   Calcium 8.6 (L) 8.9 - 10.3 mg/dL   Total Protein 5.8 (L) 6.5 - 8.1 g/dL   Albumin 3.4 (L) 3.5 - 5.0 g/dL   AST 21 15 - 41 U/L  ALT 15 0 - 44 U/L   Alkaline Phosphatase 32 (L) 38 - 126 U/L   Total Bilirubin 0.9 0.3 - 1.2 mg/dL   GFR, Estimated >60 >60 mL/min    Comment: (NOTE) Calculated using the CKD-EPI Creatinine Equation (2021)    Anion gap 14 5 - 15    Comment: Performed at [facility] [address] [place], [place] [address]  CBC with Differential     Status: Abnormal   Collection Time: 03/27/22  5:32 PM  Result Value Ref Range   WBC 5.2 4.0 - 10.5 K/uL   RBC 3.60 (L) 4.22 - 5.81 MIL/uL   Hemoglobin 12.2 (L) 13.0 - 17.0 g/dL   HCT 34.9 (L) 39.0 - 52.0 %   MCV 96.9 80.0 - 100.0 fL   MCH 33.9 26.0 - 34.0 pg   MCHC 35.0 30.0 - 36.0 g/dL   RDW 12.6 11.5 - 15.5 %   Platelets 169 150 - 400 K/uL   nRBC 0.0 0.0 - 0.2 %   Neutrophils Relative % 77 %   Neutro Abs 4.0 1.7 - 7.7 K/uL   Lymphocytes Relative 13 %   Lymphs Abs 0.7 0.7 - 4.0 K/uL   Monocytes Relative 5 %   Monocytes Absolute 0.2 0.1 - 1.0 K/uL   Eosinophils Relative 4 %   Eosinophils Absolute 0.2 0.0 - 0.5 K/uL   Basophils Relative 1 %   Basophils Absolute 0.1 0.0 - 0.1 K/uL   Immature Granulocytes 0 %   Abs Immature Granulocytes 0.02 0.00 - 0.07 K/uL    Comment: Performed at [facility] [address], [place], [place] [address]  Troponin I (High Sensitivity)     Status: Abnormal   Collection Time: 03/27/22  5:32 PM  Result Value Ref Range   Troponin I (High Sensitivity) 198 (HH) <18 ng/L    Comment: CRITICAL RESULT CALLED TO, READ BACK BY AND VERIFIED WITH R,Cantrell Larouche RN @2023$  03/27/22  E,BENTON (NOTE) Elevated high sensitivity troponin I (hsTnI) values and significant  changes across serial measurements may suggest ACS but many other  chronic and acute conditions are known to elevate hsTnI results.  Refer to the "Links" section for chest pain algorithms and additional  guidance. Performed at [facility], [place] [address] [place], [place] [address]   Brain natriuretic peptide     Status: Abnormal   Collection Time: 03/27/22  5:32 PM  Result Value Ref Range   B Natriuretic Peptide 1,638.1 (H) 0.0 - 100.0 pg/mL    Comment: Performed at [facility] [address] [place], [place] [address]  Protime-INR     Status: None   Collection Time: 03/27/22  5:32 PM  Result Value Ref Range   Prothrombin Time 14.3 11.4 - 15.2 seconds   INR 1.1 0.8 - 1.2    Comment: (NOTE) INR goal varies based on device and disease states. Performed at [facility], [place] [address] [place], [place] [address]   Troponin I (High Sensitivity)     Status: Abnormal   Collection Time: 03/27/22  7:20 PM  Result Value Ref Range   Troponin I (High Sensitivity) 375 (HH) <18 ng/L    Comment: CRITICAL VALUE NOTED. VALUE IS CONSISTENT WITH PREVIOUSLY REPORTED/CALLED VALUE (NOTE) Elevated high sensitivity troponin I (hsTnI) values and significant  changes across serial measurements may suggest ACS but many other  chronic and acute conditions are known to elevate hsTnI results.  Refer to the "Links"  section for chest pain algorithms and additional  guidance. Performed at [facility], [place] [address], [place], [place] [address]    DG Chest Port 1 View  Result Date: 03/27/2022 CLINICAL DATA:  Table formatting from the original note was not included. Reason for exam: chest pain Per ED triage notes: "Patient [facility] for evaluation of chest pain that started at 1530 today. Pain in left side of chest and jaw. EXAM: PORTABLE CHEST - 1 VIEW COMPARISON:  05/06/2013 FINDINGS:  Moderate central pulmonary vascular congestion. Peripheral septal lines at the lung bases. No confluent airspace disease. Mild cardiomegaly.  Aortic Atherosclerosis (ICD10-170.0). No effusion.  No pneumothorax. Visualized bones unremarkable. IMPRESSION: Cardiomegaly with central pulmonary vascular congestion. Electronically Signed   By: Lucrezia Europe M.D.   On: 03/27/2022 17:57    Pending Labs Unresulted Labs (From admission, onward)     Start     Ordered   03/29/22 0500  Heparin level (unfractionated)  Daily,   R      03/27/22 2038   03/28/22 0630  Lipoprotein A (LPA)  [facility] Morning draw,   R        03/28/22 0224   03/28/22 [id number]  Basic metabolic panel  [facility] Morning draw,   R        03/28/22 0224   03/28/22 0630  CBC with Differential/Platelet  [facility] Morning draw,   R        03/28/22 0224   03/28/22 0630  Lipid panel  [facility] Morning draw,   R        03/28/22 0224   03/28/22 0400  Heparin level (unfractionated)  Once-Timed,   URGENT        03/27/22 2038            Vitals/Pain Today's Vitals   03/28/22 0031 03/28/22 0045 03/28/22 0215 03/28/22 0238  BP:  (!) 151/84 (!) 175/97   Pulse:  80 88   Resp:  18 13   Temp:      TempSrc:      SpO2:  95% 97%   PainSc: 0-No pain   0-No pain    Isolation Precautions No active isolations  Medications Medications  heparin ADULT infusion 100 units/mL (25000 units/232m) (750 Units/hr Intravenous New Bag/Given 03/27/22 2116)  aspirin EC tablet 81 mg (has no administration in time range)  nitroGLYCERIN (NITROSTAT) SL tablet 0.4 mg (has no administration in time range)  ondansetron (ZOFRAN) injection 4 mg (has no administration in time range)  heparin bolus via infusion 3,500 Units (3,500 Units Intravenous Bolus from Bag 03/27/22 2117)    Mobility walks     Focused Assessments     R Recommendations: See Admitting Provider Note  Report given to:   Additional Notes:  Left eye blind

## 2022-03-28 NOTE — H&P (Signed)
Cardiology Admission History and Physical   Patient ID: David Sellers MRN: [id number]; DOB: 1940/08/03   Admission date: 03/27/2022  PCP:  [name], [facility] Cardiologist:  Quay Burow, MD        Chief Complaint:  chest pain  Patient Profile:   David Sellers is a 82 y.o. male with CAD, chronic systolic heart failure, ICM, palpitations, carotid artery stenosis s/p left CEA, hypertension, hyperlipidemia, type 2 diabetes, and musical ear syndrome  who is being seen 03/28/2022 for the evaluation of chest pain.  History of Present Illness:   [name] has known history of multivessel CAD diagnosed in 2022. At that time it was determined that he had 80% stenosis of the oLM, CTO Lcx, mLAD 60%, dLAD 70% and 95% RCA. CT surgery was consulted and he was deemed too high risk for CABG. PCI was also being considered at the time but patient declined. Medical management was pursued.  Patient has been feeling shortness of breath over the past week. This correlates with his rise in BP readings at home as per wife. Yesterday he presented to the ER complaining of chest pain at rest, sudden onset, radiating to both arms and jaw. This improved with sublingual NTG.  Upon arrival to the ER his ECG showed NSR with PVCs, aVR elevation and diffuse ST depressions (more pronounced in leads V2-V6). BP was 164/100 mmHh, HR 53, RR 14. Troponins elevated from 198 -> 375. Normal renal function. Patient on ASA and plavix at home.  Past Medical History:  Diagnosis Date   Arthritis    Asthma    only has flareup with smelly perfume    BPH (benign prostatic hyperplasia)    Carotid artery occlusion    right ICA 50% stenosis   Carotodynia    Cataract    immature to the right eye   CHF (congestive heart failure) ([place])    Coronary artery disease    Diabetes mellitus without complication (HCC)    borderline   GERD (gastroesophageal reflux disease)    takes Omeprazole daily    Headache(784.0)    r/t arthritis in neck    Hemorrhoids    History of bronchitis    last time 10-17yr ago   History of colon polyps    Hyperlipidemia    no meds since Jan 1   Hypertension    takes Verapamil,Lisinopril,and Cardura daily   Joint pain    Legally blind in left eye, as defined in [place]   hit by a baseball   Low back pain    reason unknown   Pneumonia    3 times in a row   Prolapsed hemorrhoids    Urinary frequency    sees Dr.Tannenbaum    Past Surgical History:  Procedure Laterality Date   CAROTID ENDARTERECTOMY Left 05/08/13   COLONOSCOPY     EGD with dilitation     ENDARTERECTOMY Left 05/08/2013   Procedure: LEFT CAROTID ARTERY ENDARTERECTOMY WITH DACRON PATCH ANGIOPLASTY;  Surgeon: [name];  [place] [facility]  Service: Vascular;  Laterality: Left;   EYE SURGERY     HEMORRHOID SURGERY N/A 08/13/2019   Procedure: OPEN HEMORRHOIDECTOMY;  Surgeon: [name];  [place] [facility]  Service: General;  Laterality: N/A;  LMA   HERNIA REPAIR     x 2   INGUINAL HERNIA REPAIR Right 11/09/2020   Procedure: OPEN REPAIR RIGHT INGUINAL HERNIA;  Surgeon: GHarlow Asa  Sherren Mocha, MD;  Location: [facility];  Service: General;  Laterality: Right;   INSERTION OF MESH Right 11/09/2020   Procedure: INSERTION OF MESH;  Surgeon: Armandina Gemma, MD;  Location: [facility];  Service: General;  Laterality: Right;   RETINAL DETACHMENT SURGERY     retinal laser surgery Left    plate placed   RIGHT/LEFT HEART CATH AND CORONARY ANGIOGRAPHY N/A 09/08/2020   Procedure: RIGHT/LEFT HEART CATH AND CORONARY ANGIOGRAPHY;  Surgeon: [name], MD;  Location: [facility];  Service: Cardiovascular;  Laterality: N/A;     Medications Prior to Admission: Prior to Admission medications   Medication Sig Start Date End Date Taking? Authorizing Provider  Acetaminophen (TYLENOL ARTHRITIS PAIN PO) Take 1,300 mg by mouth in the morning and at bedtime.   Yes [provider]   Alirocumab (PRALUENT) 75 MG/ML SOAJ INJECT 75 MG INTO THE SKIN EVERY 14 (FOURTEEN) DAYS. Patient taking differently: Inject 75 mg into the skin every 14 (fourteen) days. Every other Saturday 12/06/21  Yes Lorretta Harp, MD  aspirin EC 81 MG tablet Take 81 mg by mouth every morning.   Yes [provider]  carvedilol (COREG) 12.5 MG tablet Take 1 tablet (12.5 mg total) by mouth 2 (two) times daily. 09/28/21  Yes Lorretta Harp, MD  CHLORPHENIRAMINE MALEATE ER PO Take 12 mg by mouth in the morning and at bedtime.   Yes [provider]  Cholecalciferol (VITAMIN D-3) 125 MCG (5000 UT) TABS Take 5,000 Units by mouth daily.   Yes [provider]  clopidogrel (PLAVIX) 75 MG tablet TAKE 1 TABLET BY MOUTH EVERY DAY Patient taking differently: Take 75 mg by mouth daily. 08/09/21  Yes Lorretta Harp, MD  doxazosin (CARDURA) 4 MG tablet Take 1 tablet (4 mg total) by mouth 2 (two) times daily. 07/27/21  Yes Lorretta Harp, MD  Krill Oil 500 MG CAPS Take 500 mg by mouth at bedtime.   Yes [provider]  metFORMIN (GLUCOPHAGE-XR) 500 MG 24 hr tablet Take 1 tablet (500 mg total) by mouth 2 (two) times daily with breakfast and lunch. 09/11/20  Yes [name], [name], [name]  pantoprazole (PROTONIX) 40 MG tablet TAKE 1 TABLET BY MOUTH EVERY DAY 12/28/21  Yes Lorretta Harp, MD  sacubitril-valsartan (ENTRESTO) 97-103 MG Take 1 tablet by mouth 2 (two) times daily. 01/24/22  Yes Lorretta Harp, MD  spironolactone (ALDACTONE) 25 MG tablet Take 1 tablet (25 mg total) by mouth daily. 02/02/22  Yes [name], MD  [facility] VERIO test strip 2 (two) times daily. 10/07/19   [provider]     Allergies:    Allergies  Allergen Reactions   Albuterol Anaphylaxis    Per patient and daughter   Meloxicam Itching, Palpitations and Other (See Comments)    Headaches, Mood swings.   Morphine And Related Nausea And Vomiting   Statins Other (See Comments)     Myalgias. Severe arthritic response. Tolerates Lovastatin.    Cholestoff [Plant Sterols And Stanols] Other (See Comments)    Muscle aches   Nsaids Other (See Comments)    Aleve ,  Myalgia   Breo Ellipta [Fluticasone Furoate-Vilanterol]     thrush   Cortisone Hypertension    Patient states cortisone injections he has received for his knees cause hypertension and hyperglycemia   Darvon [Propoxyphene] Other (See Comments)    hallucinate   Doxycycline Cough   Emetrol     Other reaction(s): Hallucinate  Glimepiride Itching   Hydrochlorothiazide     Other reaction(s): Low Blood Pressure   Indomethacin     Neck pain flare   Jardiance [Empagliflozin]     hallucinations   Naproxen     Other reaction(s): Increased Liver Count   Penicillins     *positive allergy test*   Repatha [Evolocumab] Other (See Comments)    hyperglycemia   Sulfa Antibiotics Nausea And Vomiting   Terbinafine Diarrhea   Tramadol Other (See Comments)    Hallucinations     Social History:   Social History   Socioeconomic History   Marital status: Married    Spouse name: Not on file   Number of children: Not on file   Years of education: Not on file   Highest education level: Not on file  Occupational History   Not on file  Tobacco Use   Smoking status: Never   Smokeless tobacco: Never  Vaping Use   Vaping Use: Never used  Substance and Sexual Activity   Alcohol use: No   Drug use: No   Sexual activity: Yes  Other Topics Concern   Not on file  Social History Narrative   Not on file   Social Determinants of Health   Financial Resource Strain: Not on file  Food Insecurity: Not on file  Transportation Needs: Not on file  Physical Activity: Not on file  Stress: Not on file  Social Connections: Not on file  Intimate Partner Violence: Not on file    Family History:   The patient's family history includes Diabetes in his daughter and daughter; Heart attack (age of onset: 71) in his father; Heart  disease in his father; Hyperlipidemia in his daughter, daughter, and father; Hypertension in his daughter, daughter, and father; Kidney disease in his mother. He was adopted.    ROS:  Please see the history of present illness.  All other ROS reviewed and negative.     Physical Exam/Data:   Vitals:   03/28/22 0215 03/28/22 0244 03/28/22 0245 03/28/22 0300  BP: (!) 175/97  (!) 170/110 (!) 168/104  Pulse: 88  93 66  Resp: 13  19 16  $ Temp:  (!) 96.8 F (36 C)    TempSrc:  Temporal    SpO2: 97%  95% 96%  Weight:    65.8 kg  Height:    5' 6"$  (1.676 m)   No intake or output data in the 24 hours ending 03/28/22 0315    03/28/2022    3:00 AM 12/29/2021    9:57 AM 09/06/2021   10:45 AM  Last 3 Weights  Weight (lbs) 145 lb 141 lb 3.2 oz 140 lb  Weight (kg) 65.772 kg 64.048 kg 63.504 kg     Body mass index is 23.4 kg/m.  General:  Well nourished, well developed, in no acute distress HEENT: normal Neck: no JVD Vascular: No carotid bruits; Distal pulses 2+ bilaterally   Cardiac:  normal S1, S2; RRR; no murmur  Lungs:  clear to auscultation bilaterally, no wheezing, rhonchi or rales  Abd: soft, nontender, no hepatomegaly  Ext: no edema Musculoskeletal:  No deformities, BUE and BLE strength normal and equal Skin: warm and dry  Neuro:  CNs 2-12 intact, no focal abnormalities noted Psych:  Normal affect    EKG:  sinus with PVCs  Relevant CV Studies:  Laboratory Data:  High Sensitivity Troponin:   Recent Labs  Lab 03/27/22 1732 03/27/22 1920  TROPONINIHS 198* 375*      Chemistry  Recent Labs  Lab 03/27/22 1732  NA 139  K 3.4*  CL 106  CO2 19*  GLUCOSE 194*  BUN 13  CREATININE 1.04  CALCIUM 8.6*  GFRNONAA >60  ANIONGAP 14    Recent Labs  Lab 03/27/22 1732  PROT 5.8*  ALBUMIN 3.4*  AST 21  ALT 15  ALKPHOS 32*  BILITOT 0.9   Lipids No results for input(s): "CHOL", "TRIG", "HDL", "LABVLDL", "LDLCALC", "CHOLHDL" in the last 168 hours. Hematology Recent Labs   Lab 03/27/22 1732  WBC 5.2  RBC 3.60*  HGB 12.2*  HCT 34.9*  MCV 96.9  MCH 33.9  MCHC 35.0  RDW 12.6  PLT 169   Thyroid No results for input(s): "TSH", "FREET4" in the last 168 hours. BNP Recent Labs  Lab 03/27/22 1732  BNP 1,638.1*    DDimer No results for input(s): "DDIMER" in the last 168 hours.   Radiology/Studies:  DG Chest Port 1 View  Result Date: 03/27/2022 CLINICAL DATA:  Table formatting from the original note was not included. Reason for exam: chest pain Per ED triage notes: "Patient [facility] for evaluation of chest pain that started at 1530 today. Pain in left side of chest and jaw. EXAM: PORTABLE CHEST - 1 VIEW COMPARISON:  05/06/2013 FINDINGS: Moderate central pulmonary vascular congestion. Peripheral septal lines at the lung bases. No confluent airspace disease. Mild cardiomegaly.  Aortic Atherosclerosis (ICD10-170.0). No effusion.  No pneumothorax. Visualized bones unremarkable. IMPRESSION: Cardiomegaly with central pulmonary vascular congestion. Electronically Signed   By: Lucrezia Europe M.D.   On: 03/27/2022 17:57     Assessment and Plan:    82 y.o. male with CAD, chronic systolic heart failure, ICM, palpitations, carotid artery stenosis s/p left CEA, hypertension, hyperlipidemia, type 2 diabetes admitted with hypertensive crisis, chest pain and elevated troponins.  NSTEMI vs type 2 MI from uncontrolled hypertension Hypertension Multivessel CAD (declined surgery and PCI in the past) Chronic systolic HF (EF 123XX123 by MRI), baseline NYHA II. Euvolemic DM  Plan: - I believe his current presentation is secondary to uncontrolled hypertension as opposed to a true type I myocardial infarction. However, he has known severe multivessel disease involving LM disease and as of now we will keep him on medical management for ACS with heparin drip and ASA - Could revisit PCI options as patient and wife believe there were no PCI options in the past  - Continue GDMT -  Keep NPO for now - Echo in the morning - Telemetry monitoring    Risk Assessment/Risk Scores:    TIMI Risk Score for Unstable Angina or Non-ST Elevation MI:   The patient's TIMI risk score is  , which indicates a  % risk of all cause mortality, new or recurrent myocardial infarction or need for urgent revascularization in the next 14 days.       Severity of Illness: The appropriate patient status for this patient is OBSERVATION. Observation status is judged to be reasonable and necessary in order to provide the required intensity of service to ensure the patient's safety. The patient's presenting symptoms, physical exam findings, and initial radiographic and laboratory data in the context of their medical condition is felt to place them at decreased risk for further clinical deterioration. Furthermore, it is anticipated that the patient will be medically stable for discharge from the hospital within 2 midnights of admission.    For questions or updates, please contact [place] Please consult [email or web address] for contact info under  Signed, Ewing Schlein, MD  03/28/2022 3:15 AM

## 2022-03-28 NOTE — Progress Notes (Signed)
Rounding Note    Patient Name: David Sellers Date of Encounter: 03/28/2022  [place] Cardiologist: Quay Burow, MD   Subjective   Patient currently denies any chest, jaw or arm pain. No dyspnea. Feels comfortable and walked to BR.  Inpatient Medications    Scheduled Meds:  [START ON 03/29/2022] aspirin EC  81 mg Oral Daily   carvedilol  25 mg Oral BID WC   clopidogrel  75 mg Oral Daily   isosorbide mononitrate  30 mg Oral Daily   sacubitril-valsartan  1 tablet Oral BID   spironolactone  25 mg Oral Daily   Continuous Infusions:  heparin 900 Units/hr (03/28/22 0728)   PRN Meds: nitroGLYCERIN, ondansetron (ZOFRAN) IV   Vital Signs    Vitals:   03/28/22 0630 03/28/22 0645 03/28/22 0718 03/28/22 0750  BP: (!) 163/89 (!) 161/98 (!) 163/95 (!) 142/87  Pulse: 83 82 82 95  Resp: 17 (!) 27 20 16  $ Temp:   97.9 F (36.6 C)   TempSrc:   Oral   SpO2: 99% 96% 98% 98%  Weight:      Height:       No intake or output data in the 24 hours ending 03/28/22 0819    03/28/2022    3:00 AM 12/29/2021    9:57 AM 09/06/2021   10:45 AM  Last 3 Weights  Weight (lbs) 145 lb 141 lb 3.2 oz 140 lb  Weight (kg) 65.772 kg 64.048 kg 63.504 kg      Telemetry    NSR with frequent PVCs - Personally Reviewed  ECG    NSR rate 89, frequent PVCs. Inferolateral ST depression. Mild ST elevation AVR - Personally Reviewed  Physical Exam   GEN: No acute distress.   Neck: No JVD, left  CEA scar Cardiac: RRR, no murmurs, rubs, or gallops. Femoral and PT tibial pulses 2+ bilateral. DP pulses weak. No bruits.  Respiratory: Clear to auscultation bilaterally. GI: Soft, nontender, non-distended  MS: No edema; No deformity. Neuro:  Nonfocal  Psych: Normal affect   Labs    High Sensitivity Troponin:   Recent Labs  Lab 03/27/22 1732 03/27/22 1920  TROPONINIHS 198* 375*     Chemistry Recent Labs  Lab 03/27/22 1732 03/28/22 0350  NA 139 139  K 3.4* 3.2*  CL 106 105  CO2  19* 22  GLUCOSE 194* 177*  BUN 13 10  CREATININE 1.04 0.95  CALCIUM 8.6* 8.9  PROT 5.8*  --   ALBUMIN 3.4*  --   AST 21  --   ALT 15  --   ALKPHOS 32*  --   BILITOT 0.9  --   GFRNONAA >60 >60  ANIONGAP 14 12    Lipids  Recent Labs  Lab 03/28/22 0350  CHOL 118  TRIG 101  HDL 41  LDLCALC 57  CHOLHDL 2.9    Hematology Recent Labs  Lab 03/27/22 1732 03/28/22 0350  WBC 5.2 7.2  RBC 3.60* 3.72*  HGB 12.2* 12.7*  HCT 34.9* 34.4*  MCV 96.9 92.5  MCH 33.9 34.1*  MCHC 35.0 36.9*  RDW 12.6 12.6  PLT 169 172   Thyroid No results for input(s): "TSH", "FREET4" in the last 168 hours.  BNP Recent Labs  Lab 03/27/22 1732  BNP 1,638.1*    DDimer No results for input(s): "DDIMER" in the last 168 hours.   Radiology    DG Chest Port 1 View  Result Date: 03/27/2022 CLINICAL DATA:  Table formatting from the  original note was not included. Reason for exam: chest pain Per ED triage notes: "Patient [facility] for evaluation of chest pain that started at 1530 today. Pain in left side of chest and jaw. EXAM: PORTABLE CHEST - 1 VIEW COMPARISON:  05/06/2013 FINDINGS: Moderate central pulmonary vascular congestion. Peripheral septal lines at the lung bases. No confluent airspace disease. Mild cardiomegaly.  Aortic Atherosclerosis (ICD10-170.0). No effusion.  No pneumothorax. Visualized bones unremarkable. IMPRESSION: Cardiomegaly with central pulmonary vascular congestion. Electronically Signed   By: Lucrezia Europe M.D.   On: 03/27/2022 17:57    Cardiac Studies   Myoview 07/10/20: Study Highlights    Nuclear stress EF: 25%. The left ventricular ejection fraction is severely decreased (<30%). There was no ST segment deviation noted during stress. No T wave inversion was noted during stress. Findings consistent with prior myocardial infarction. This is a high risk study.   1. There is a large (~15-20% of LV), moderate to severe, predominantly fixed perfusion defect present in the  basal to mid inferior/inferolateral segments. There is mild reversibility of the inferior segments. These segments are akinetic, so this favors prior infarction with mild peri-infarct ischemia. 2. Severely reduced LVEF, 25%.  3. Akinesis of the basal to mid inferior/inferolateral segments. Global hypokinesis in other segments.  4. This is a high risk study.    Cardiac cath 09/08/20:  RIGHT/LEFT HEART CATH AND CORONARY ANGIOGRAPHY   Conclusion      Prox RCA lesion is 80% stenosed.   Mid RCA lesion is 95% stenosed.   Ost Cx lesion is 100% stenosed.   Ost LM lesion is 80% stenosed.   Mid LAD lesion is 60% stenosed.   1st Diag lesion is 95% stenosed.   Dist LAD lesion is 70% stenosed.   1. Low filling pressures.  2. Preserved cardiac output.  3. Severe coronary disease with critical mid RCA stenosis, occluded ostial LCx with collaterals from PDA, and 80% left main stenosis with moderate LAD disease.    I will admit the patient for consultation for CABG.  This is complicated by ischemic cardiomyopathy, though he is well-compensated.   Diagnostic Dominance: Right  Interven Hemo Data  Flowsheet Row Most Recent Value  Fick Cardiac Output 4.81 L/min  Fick Cardiac Output Index 2.83 (L/min)/BSA  RA A Wave 0 mmHg  RA V Wave 0 mmHg  RA Mean -4 mmHg  RV Systolic Pressure 19 mmHg  RV Diastolic Pressure -4 mmHg  RV EDP 2 mmHg  PA Systolic Pressure 19 mmHg  PA Diastolic Pressure 1 mmHg  PA Mean 8 mmHg  PW A Wave 2 mmHg  PW V Wave 2 mmHg  PW Mean 1 mmHg  AO Systolic Pressure [id number] mmHg  AO Diastolic Pressure 51 mmHg  AO Mean 81 mmHg  QP/QS 1  TPVR Index 2.83 HRUI  TSVR Index 28.65 HRUI  TPVR/TSVR Ratio 0.1    Cardiac MRI 09/09/20: CLINICAL DATA:  Ischemic cardiomyopathy, assess viability.   EXAM: CARDIAC MRI   TECHNIQUE: The patient was scanned on a 1.5 Tesla GE magnet. A dedicated cardiac coil was used. Functional imaging was done using Fiesta sequences. 2,3, and 4 chamber  views were done to assess for RWMA's. Modified Simpson's rule using a short axis stack was used to calculate an ejection fraction on a dedicated work Conservation officer, nature. The patient received 8 cc of Gadavist. After 10 minutes inversion recovery sequences were used to assess for infiltration and scar tissue.   FINDINGS: Normal left ventricular  size and normal wall thickness. Basal to mid inferolateral akinesis, inferior hypokinesis, mid anterolateral hypokinesis. Images not optimal for EF quantification, estimated LV EF 25-30%. Normal right ventricular size and systolic function. Normal left and right atrial size. No significant mitral regurgitation or tricuspid regurgitation. Trileaflet aortic valve, no significant regurgitation or stenosis.   Delayed enhancement imaging:   25 - 49% wall thickness subendocardial late gadolinium enhancement in the basal to mid inferolateral wall.   IMPRESSION: 1. Normal left ventricular size with wall motion abnormalities as noted above. LV EF 25-30% (images not ideal for quantification by Simpson's rule).   2.  Normal RV size and systolic function.   3. There is significant myocardial viability. There is non-transmural scar only in the basal to mid inferolateral wall. May see significant LV functional improvement with full revascularization.   Dalton Mclean     Electronically Signed   By: Loralie Champagne M.D.   On: 09/09/2020 14:20    Echo 06/03/21: IMPRESSIONS     1. Left ventricular ejection fraction, by estimation, is 35 to 40%. Left  ventricular ejection fraction by 3D volume is 35 %. The left ventricle has  moderately decreased function. The left ventricle has no regional wall  motion abnormalities. Left  ventricular diastolic parameters are consistent with Grade I diastolic  dysfunction (impaired relaxation). There is akinesis of the left  ventricular, entire inferolateral wall. The average left ventricular  global  longitudinal strain is -12.4 %. The global   longitudinal strain is abnormal.   2. Right ventricular systolic function is normal. The right ventricular  size is normal. There is moderately elevated pulmonary artery systolic  pressure.   3. The mitral valve is normal in structure. Trivial mitral valve  regurgitation. No evidence of mitral stenosis.   4. The aortic valve is tricuspid. Aortic valve regurgitation is not  visualized. Aortic valve sclerosis/calcification is present, without any  evidence of aortic stenosis.   5. There is mild dilatation of the aortic root, measuring 40 mm.   6. The inferior vena cava is normal in size with greater than 50%  respiratory variability, suggesting right atrial pressure of 3 mmHg   7. Compared to echo dated 06/24/2020, LVF has improved.   Carotid US 11/22/21: IMPRESSIONS     1. Left ventricular ejection fraction, by estimation, is 35 to 40%. Left  ventricular ejection fraction by 3D volume is 35 %. The left ventricle has  moderately decreased function. The left ventricle has no regional wall  motion abnormalities. Left  ventricular diastolic parameters are consistent with Grade I diastolic  dysfunction (impaired relaxation). There is akinesis of the left  ventricular, entire inferolateral wall. The average left ventricular  global longitudinal strain is -12.4 %. The global   longitudinal strain is abnormal.   2. Right ventricular systolic function is normal. The right ventricular  size is normal. There is moderately elevated pulmonary artery systolic  pressure.   3. The mitral valve is normal in structure. Trivial mitral valve  regurgitation. No evidence of mitral stenosis.   4. The aortic valve is tricuspid. Aortic valve regurgitation is not  visualized. Aortic valve sclerosis/calcification is present, without any  evidence of aortic stenosis.   5. There is mild dilatation of the aortic root, measuring 40 mm.   6. The inferior vena cava is  normal in size with greater than 50%  respiratory variability, suggesting right atrial pressure of 3 mmHg   7. Compared to echo dated 06/24/2020, LVF has improved.  Patient Profile     82 y.o. male  with CAD, chronic systolic heart failure, ICM, palpitations, carotid artery stenosis s/p left CEA, hypertension, hyperlipidemia, type 2 diabetes, and musical ear syndrome  who is being seen 03/28/2022 for the evaluation of chest pain.   Assessment & Plan    NSTEMI. Patient has known severe CAD by cath in August 2022 with 80% ostial left main, moderate mid LAD, occluded ostial LCx and severe tandem lesions in the proximal and mid RCA. Vessels are severely calcified. Patient seen in 2022 by CT surgery. Patient opted for medical therapy. He is still active as a Theme park manager. Walks 10 minutes on flat without problems. 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 Recent onset symptoms of SOB, jaw, bilateral arm and chest pain. Severely hypertensive. Symptoms may be due to demand ischemia in setting of hypertension versus progression of his CAD. Currently he is comfortable and BP improved. We discussed his known CAD. He is aware and desires to continue medical therapy without any invasive evaluation. He has a standing DNR in place and states he is ready if it is his time. Will update Echo. Plan to increase Coreg and add Imdur. Will continue DAPT with ASA and Plavix. Continue IV heparin for 48 hours.  HTN. Continue aldactone, Entresto. Increase Coreg and add Imdur. Will follow. Continue cardura.  Hypokalemia. Replete Chronic combined systolic/diastolic CHF. Last EF 34-40%. Will update. Volume status looks good. HLD on Praluent DM on metformin. Will hold for now and monitor.  7.   Left calf pain will check LE arterial dopplers.       For questions or updates, please contact [place] Please consult [email or web address] for contact info under         Signed, Pau Banh Martinique, MD  03/28/2022, 8:19 AM

## 2022-03-28 NOTE — Progress Notes (Signed)
ANTICOAGULATION CONSULT NOTE - Follow Up Consult  Pharmacy Consult for heparin Indication: NSTEMI  Labs: Recent Labs    03/27/22 1732 03/27/22 1920 03/28/22 0350 03/28/22 1303 03/28/22 2227  HGB 12.2*  --  12.7*  --   --   HCT 34.9*  --  34.4*  --   --   PLT 169  --  172  --   --   LABPROT 14.3  --   --   --   --   INR 1.1  --   --   --   --   HEPARINUNFRC  --   --  0.26* 0.15* 0.59  CREATININE 1.04  --  0.95  --   --   TROPONINIHS 198* 375*  --   --   --     Assessment/Plan:  82yo male therapeutic on heparin after rate change. Will continue infusion at current rate of 1150 units/hr and confirm stable with am labs.   Wynona Neat, PharmD, BCPS  03/28/2022,11:13 PM

## 2022-03-28 NOTE — ED Notes (Signed)
Admitting MD at bedside.

## 2022-03-28 NOTE — ED Notes (Signed)
ED TO INPATIENT HANDOFF REPORT  ED Nurse Name and Phone #: Andee Poles [phone]  S Name/Age/Gender David Sellers 82 y.o. male Room/Bed: 008C/008C  Code Status   Code Status: DNR  Home/SNF/Other Home Patient oriented to: self, place, time, and situation Is this baseline? Yes   Triage Complete: Triage complete  Chief Complaint NSTEMI (non-ST elevated myocardial infarction) [facility]) [I21.4]  Triage Note Patient [facility] for evaluation of chest pain that started at 1530 today. Pain in left side of chest and jaw. Patient received 324 mg ASA and 1x SL NTG from EMS PTA. 20g saline lock in left forearm.   Allergies Allergies  Allergen Reactions   Albuterol Anaphylaxis    Per patient and daughter   Meloxicam Itching, Palpitations and Other (See Comments)    Headaches, Mood swings.   Morphine And Related Nausea And Vomiting   Statins Other (See Comments)    Myalgias. Severe arthritic response. Tolerates Lovastatin.    Cholestoff [Plant Sterols And Stanols] Other (See Comments)    Muscle aches   Nsaids Other (See Comments)    Aleve ,  Myalgia   Breo Ellipta [Fluticasone Furoate-Vilanterol]     thrush   Cortisone Hypertension    Patient states cortisone injections he has received for his knees cause hypertension and hyperglycemia   Darvon [Propoxyphene] Other (See Comments)    hallucinate   Doxycycline Cough   Emetrol     Other reaction(s): Hallucinate   Glimepiride Itching   Hydrochlorothiazide     Other reaction(s): Low Blood Pressure   Indomethacin     Neck pain flare   Jardiance [Empagliflozin]     hallucinations   Naproxen     Other reaction(s): Increased Liver Count   Penicillins     *positive allergy test*   Repatha [Evolocumab] Other (See Comments)    hyperglycemia   Sulfa Antibiotics Nausea And Vomiting   Terbinafine Diarrhea   Tramadol Other (See Comments)    Hallucinations     Level of Care/Admitting Diagnosis ED Disposition     ED Disposition   Admit   Condition  --   Comment  Hospital Area: [facility] [100100]  Level of Care: Progressive [102]  Admit to Progressive based on following criteria: CARDIOVASCULAR & THORACIC of moderate stability with acute coronary syndrome symptoms/low risk myocardial infarction/hypertensive urgency/arrhythmias/heart failure potentially compromising stability and stable post cardiovascular intervention patients.  May admit patient to [facility] or [facility] if equivalent level of care is available:: No  Covid Evaluation: Asymptomatic - no recent exposure (last 10 days) testing not required  Diagnosis: NSTEMI (non-ST elevated myocardial infarction) [facility]) [id number]  Admitting Physician: Ewing Schlein [id number]  Attending Physician: Ewing Schlein [id number]  Certification:: I certify this patient will need inpatient services for at least 2 midnights  Estimated Length of Stay: 2          B Medical/Surgery History Past Medical History:  Diagnosis Date   Arthritis    Asthma    only has flareup with smelly perfume    BPH (benign prostatic hyperplasia)    Carotid artery occlusion    right ICA 50% stenosis   Carotodynia    Cataract    immature to the right eye   CHF (congestive heart failure) ([place])    Coronary artery disease    Diabetes mellitus without complication (HCC)    borderline   GERD (gastroesophageal reflux disease)    takes Omeprazole daily   Headache(784.0)  r/t arthritis in neck    Hemorrhoids    History of bronchitis    last time 10-7yr ago   History of colon polyps    Hyperlipidemia    no meds since Jan 1   Hypertension    takes Verapamil,Lisinopril,and Cardura daily   Joint pain    Legally blind in left eye, as defined in [employer]   hit by a baseball   Low back pain    reason unknown   Pneumonia    3 times in a row   Prolapsed hemorrhoids    Urinary frequency    sees Dr.Tannenbaum   Past Surgical History:   Procedure Laterality Date   CAROTID ENDARTERECTOMY Left 05/08/13   COLONOSCOPY     EGD with dilitation     ENDARTERECTOMY Left 05/08/2013   Procedure: LEFT CAROTID ARTERY ENDARTERECTOMY WITH DACRON PATCH ANGIOPLASTY;  Surgeon: [name];  [facility]: [facility]  Service: Vascular;  Laterality: Left;   EYE SURGERY     HEMORRHOID SURGERY N/A 08/13/2019   Procedure: OPEN HEMORRHOIDECTOMY;  Surgeon: [name];  [place] [facility]  Service: General;  Laterality: N/A;  LMA   HERNIA REPAIR     x 2   INGUINAL HERNIA REPAIR Right 11/09/2020   Procedure: OPEN REPAIR RIGHT INGUINAL HERNIA;  Surgeon: [name];  [place] [facility]  Service: General;  Laterality: Right;   INSERTION OF MESH Right 11/09/2020   Procedure: INSERTION OF MESH;  Surgeon: [name];  [place] [facility]  Service: General;  Laterality: Right;   RETINAL DETACHMENT SURGERY     retinal laser surgery Left    plate placed   RIGHT/LEFT HEART CATH AND CORONARY ANGIOGRAPHY N/A 09/08/2020   Procedure: RIGHT/LEFT HEART CATH AND CORONARY ANGIOGRAPHY;  Surgeon: MLarey Dresser MD;  [facility];  Service: Cardiovascular;  Laterality: N/A;     A IV Location/Drains/Wounds Patient Lines/Drains/Airways Status     Active Line/Drains/Airways     Name Placement date Placement time Site Days   Peripheral IV 03/27/22 20 G Left Forearm 03/27/22  --  Forearm  1   Peripheral IV 03/27/22 18 G Right Wrist 03/27/22  2124  Wrist  1   Incision (Closed) 05/08/13 Neck Left 05/08/13  0919  -- 3246   Incision (Closed) 08/13/19 Rectum Other (Comment) 08/13/19  0814  -- 958   Incision (Closed) 11/09/20 Groin Right 11/09/20  1235  -- 504            Intake/Output Last 24 hours No intake or output data in the 24 hours ending 03/28/22 1151  Labs/Imaging Results for orders placed or performed during the hospital encounter of 03/27/22 (from the past 48 hour(s))  Comprehensive metabolic panel      Status: Abnormal   Collection Time: 03/27/22  5:32 PM  Result Value Ref Range   Sodium 139 135 - 145 mmol/L   Potassium 3.4 (L) 3.5 - 5.1 mmol/L   Chloride 106 98 - 111 mmol/L   CO2 19 (L) 22 - 32 mmol/L   Glucose, Bld 194 (H) 70 - 99 mg/dL    Comment: Glucose reference range applies only to samples taken after fasting for at least 8 hours.   BUN 13 8 - 23 mg/dL   Creatinine, Ser 1.04 0.61 - 1.24 mg/dL   Calcium 8.6 (L) 8.9 - 10.3 mg/dL   Total Protein 5.8 (L) 6.5 - 8.1 g/dL   Albumin 3.4 (L) 3.5 -  5.0 g/dL   AST 21 15 - 41 U/L   ALT 15 0 - 44 U/L   Alkaline Phosphatase 32 (L) 38 - 126 U/L   Total Bilirubin 0.9 0.3 - 1.2 mg/dL   GFR, Estimated >60 >60 mL/min    Comment: (NOTE) Calculated using the CKD-EPI Creatinine Equation (2021)    Anion gap 14 5 - 15    Comment: Performed at [facility] [address], [place], [place] [address]  CBC with Differential     Status: Abnormal   Collection Time: 03/27/22  5:32 PM  Result Value Ref Range   WBC 5.2 4.0 - 10.5 K/uL   RBC 3.60 (L) 4.22 - 5.81 MIL/uL   Hemoglobin 12.2 (L) 13.0 - 17.0 g/dL   HCT 34.9 (L) 39.0 - 52.0 %   MCV 96.9 80.0 - 100.0 fL   MCH 33.9 26.0 - 34.0 pg   MCHC 35.0 30.0 - 36.0 g/dL   RDW 12.6 11.5 - 15.5 %   Platelets 169 150 - 400 K/uL   nRBC 0.0 0.0 - 0.2 %   Neutrophils Relative % 77 %   Neutro Abs 4.0 1.7 - 7.7 K/uL   Lymphocytes Relative 13 %   Lymphs Abs 0.7 0.7 - 4.0 K/uL   Monocytes Relative 5 %   Monocytes Absolute 0.2 0.1 - 1.0 K/uL   Eosinophils Relative 4 %   Eosinophils Absolute 0.2 0.0 - 0.5 K/uL   Basophils Relative 1 %   Basophils Absolute 0.1 0.0 - 0.1 K/uL   Immature Granulocytes 0 %   Abs Immature Granulocytes 0.02 0.00 - 0.07 K/uL    Comment: Performed at [facility] [address], [place], [place] [address]  Troponin I (High Sensitivity)     Status: Abnormal   Collection Time: 03/27/22  5:32 PM  Result Value Ref Range   Troponin I (High Sensitivity) 198 (HH) <18 ng/L     Comment: CRITICAL RESULT CALLED TO, READ BACK BY AND VERIFIED WITH R,SANGALANG RN @2023$  03/27/22 E,BENTON (NOTE) Elevated high sensitivity troponin I (hsTnI) values and significant  changes across serial measurements may suggest ACS but many other  chronic and acute conditions are known to elevate hsTnI results.  Refer to the "Links" section for chest pain algorithms and additional  guidance. Performed at [facility], [place] [address] [place], [place] [address]   Brain natriuretic peptide     Status: Abnormal   Collection Time: 03/27/22  5:32 PM  Result Value Ref Range   B Natriuretic Peptide 1,638.1 (H) 0.0 - 100.0 pg/mL    Comment: Performed at [facility] [address] [place], [place] [address]  Protime-INR     Status: None   Collection Time: 03/27/22  5:32 PM  Result Value Ref Range   Prothrombin Time 14.3 11.4 - 15.2 seconds   INR 1.1 0.8 - 1.2    Comment: (NOTE) INR goal varies based on device and disease states. Performed at [facility], [place] [address], [place], [place] [address]   Troponin I (High Sensitivity)     Status: Abnormal   Collection Time: 03/27/22  7:20 PM  Result Value Ref Range   Troponin I (High Sensitivity) 375 (HH) <18 ng/L    Comment: CRITICAL VALUE NOTED. VALUE IS CONSISTENT WITH PREVIOUSLY REPORTED/CALLED VALUE (NOTE) Elevated high sensitivity troponin I (hsTnI) values and significant  changes across serial measurements may suggest ACS but many other  chronic and acute  conditions are known to elevate hsTnI results.  Refer to the "Links" section for chest pain algorithms and additional  guidance. Performed at [facility], [place] [address], [place], [place] [address]   Heparin level (unfractionated)     Status: Abnormal   Collection Time: 03/28/22  3:50 AM  Result Value Ref Range   Heparin Unfractionated 0.26 (L) 0.30 - 0.70 IU/mL    Comment: (NOTE) The clinical reportable range upper limit is being lowered to  >1.10 to align with the FDA approved guidance for the current laboratory assay.  If heparin results are below expected values, and patient dosage has  been confirmed, suggest follow up testing of antithrombin III levels. Performed at [facility], [place] [address] [place], [place] [id number]   Basic metabolic panel     Status: Abnormal   Collection Time: 03/28/22  3:50 AM  Result Value Ref Range   Sodium 139 135 - 145 mmol/L   Potassium 3.2 (L) 3.5 - 5.1 mmol/L   Chloride 105 98 - 111 mmol/L   CO2 22 22 - 32 mmol/L   Glucose, Bld 177 (H) 70 - 99 mg/dL    Comment: Glucose reference range applies only to samples taken after fasting for at least 8 hours.   BUN 10 8 - 23 mg/dL   Creatinine, Ser 0.95 0.61 - 1.24 mg/dL   Calcium 8.9 8.9 - 10.3 mg/dL   GFR, Estimated >60 >60 mL/min    Comment: (NOTE) Calculated using the CKD-EPI Creatinine Equation (2021)    Anion gap 12 5 - 15    Comment: Performed at [facility] [address], [place], [place] [address]  CBC with Differential/Platelet     Status: Abnormal   Collection Time: 03/28/22  3:50 AM  Result Value Ref Range   WBC 7.2 4.0 - 10.5 K/uL   RBC 3.72 (L) 4.22 - 5.81 MIL/uL   Hemoglobin 12.7 (L) 13.0 - 17.0 g/dL   HCT 34.4 (L) 39.0 - 52.0 %   MCV 92.5 80.0 - 100.0 fL   MCH 34.1 (H) 26.0 - 34.0 pg   MCHC 36.9 (H) 30.0 - 36.0 g/dL   RDW 12.6 11.5 - 15.5 %   Platelets 172 150 - 400 K/uL   nRBC 0.0 0.0 - 0.2 %   Neutrophils Relative % 80 %   Neutro Abs 5.8 1.7 - 7.7 K/uL   Lymphocytes Relative 12 %   Lymphs Abs 0.8 0.7 - 4.0 K/uL   Monocytes Relative 5 %   Monocytes Absolute 0.4 0.1 - 1.0 K/uL   Eosinophils Relative 2 %   Eosinophils Absolute 0.1 0.0 - 0.5 K/uL   Basophils Relative 1 %   Basophils Absolute 0.1 0.0 - 0.1 K/uL   Immature Granulocytes 0 %   Abs Immature Granulocytes 0.02 0.00 - 0.07 K/uL    Comment: Performed at [facility], [address] [place], [place] [address]  Lipid panel      Status: None   Collection Time: 03/28/22  3:50 AM  Result Value Ref Range   Cholesterol 118 0 - 200 mg/dL   Triglycerides 101 <150 mg/dL   HDL 41 >40 mg/dL   Total CHOL/HDL Ratio 2.9 RATIO   VLDL 20 0 - 40 mg/dL   LDL Cholesterol 57 0 - 99 mg/dL    Comment:        Total Cholesterol/HDL:CHD Risk Coronary Heart Disease Risk Table  Men   Women  1/2 Average Risk   3.4   3.3  Average Risk       5.0   4.4  2 X Average Risk   9.6   7.1  3 X Average Risk  23.4   11.0        Use the calculated Patient Ratio above and the CHD Risk Table to determine the patient's CHD Risk.        ATP III CLASSIFICATION (LDL):  <100     mg/dL   Optimal  100-129  mg/dL   Near or Above                    Optimal  130-159  mg/dL   Borderline  160-189  mg/dL   High  >190     mg/dL   Very High Performed at [facility] [address] [place], [place] [address]    DG Chest Port 1 View  Result Date: 03/27/2022 CLINICAL DATA:  Table formatting from the original note was not included. Reason for exam: chest pain Per ED triage notes: "Patient [facility] for evaluation of chest pain that started at 1530 today. Pain in left side of chest and jaw. EXAM: PORTABLE CHEST - 1 VIEW COMPARISON:  05/06/2013 FINDINGS: Moderate central pulmonary vascular congestion. Peripheral septal lines at the lung bases. No confluent airspace disease. Mild cardiomegaly.  Aortic Atherosclerosis (ICD10-170.0). No effusion.  No pneumothorax. Visualized bones unremarkable. IMPRESSION: Cardiomegaly with central pulmonary vascular congestion. Electronically Signed   By: Lucrezia Europe M.D.   On: 03/27/2022 17:57    Pending Labs Unresulted Labs (From admission, onward)     Start     Ordered   03/29/22 0500  Heparin level (unfractionated)  Daily,   R      03/27/22 2038   03/29/22 0500  CBC  Daily,   R      03/28/22 0824   03/29/22 0500  Magnesium  Tomorrow morning,   R        03/28/22 0824   03/28/22 1300   Heparin level (unfractionated)  Once-Timed,   TIMED        03/28/22 0507   03/28/22 0500  Lipoprotein A (LPA)  Once,   R        03/28/22 0500            Vitals/Pain Today's Vitals   03/28/22 0750 03/28/22 0815 03/28/22 0845 03/28/22 1137  BP: (!) 142/87 (!) 166/90 (!) 170/79   Pulse: 95 86 90   Resp: 16 15 (!) 21   Temp:    98.2 F (36.8 C)  TempSrc:    Oral  SpO2: 98% 98% 96%   Weight:      Height:      PainSc:        Isolation Precautions No active isolations  Medications Medications  heparin ADULT infusion 100 units/mL (25000 units/287m) (900 Units/hr Intravenous Rate/Dose Verify 03/28/22 0728)  aspirin EC tablet 81 mg (has no administration in time range)  nitroGLYCERIN (NITROSTAT) SL tablet 0.4 mg (has no administration in time range)  ondansetron (ZOFRAN) injection 4 mg (has no administration in time range)  sacubitril-valsartan (ENTRESTO) 97-103 mg per tablet (1 tablet Oral Given 03/28/22 0314)  spironolactone (ALDACTONE) tablet 25 mg (25 mg Oral Given 03/28/22 1013)  clopidogrel (PLAVIX) tablet 75 mg (75 mg Oral Given 03/28/22 1013)  carvedilol (COREG) tablet 25 mg (has no administration in time range)  isosorbide mononitrate (  IMDUR) 24 hr tablet 30 mg (30 mg Oral Given 03/28/22 1013)  doxazosin (CARDURA) tablet 4 mg (has no administration in time range)  heparin bolus via infusion 3,500 Units (3,500 Units Intravenous Bolus from Bag 03/27/22 2117)    Mobility walks     Focused Assessments    R Recommendations: See Admitting Provider Note  Report given to:   Additional Notes:

## 2022-03-28 NOTE — Progress Notes (Signed)
ANTICOAGULATION CONSULT NOTE - Follow Up Consult  Pharmacy Consult for heparin Indication: chest pain/ACS  Labs: Recent Labs    03/27/22 1732 03/27/22 1920 03/28/22 0350  HGB 12.2*  --  12.7*  HCT 34.9*  --  34.4*  PLT 169  --  172  LABPROT 14.3  --   --   INR 1.1  --   --   HEPARINUNFRC  --   --  0.26*  CREATININE 1.04  --  0.95  TROPONINIHS 198* 375*  --     Assessment: 82yo male subtherapeutic on heparin with initial dosing for ACS; no infusion issues or signs of bleeding per RN.  Goal of Therapy:  Heparin level 0.3-0.7 units/ml   Plan:  Will increase heparin infusion by 2 units/kg/hr to 1100 units/hr and check level in 8 hours.    Wynona Neat, PharmD, BCPS  03/28/2022,5:07 AM

## 2022-03-29 DIAGNOSIS — I214 Non-ST elevation (NSTEMI) myocardial infarction: Secondary | ICD-10-CM

## 2022-03-29 LAB — CBC
HCT: 29.8 % — ABNORMAL LOW (ref 39.0–52.0)
Hemoglobin: 10.9 g/dL — ABNORMAL LOW (ref 13.0–17.0)
MCH: 34.1 pg — ABNORMAL HIGH (ref 26.0–34.0)
MCHC: 36.6 g/dL — ABNORMAL HIGH (ref 30.0–36.0)
MCV: 93.1 fL (ref 80.0–100.0)
Platelets: 163 10*3/uL (ref 150–400)
RBC: 3.2 MIL/uL — ABNORMAL LOW (ref 4.22–5.81)
RDW: 12.8 % (ref 11.5–15.5)
WBC: 5.2 10*3/uL (ref 4.0–10.5)
nRBC: 0 % (ref 0.0–0.2)

## 2022-03-29 LAB — LIPOPROTEIN A (LPA): Lipoprotein (a): 186.6 nmol/L — ABNORMAL HIGH (ref <75.0)

## 2022-03-29 LAB — GLUCOSE, CAPILLARY
Glucose-Capillary: 209 mg/dL — ABNORMAL HIGH (ref 70–99)
Glucose-Capillary: 158 mg/dL — ABNORMAL HIGH (ref 70–99)
Glucose-Capillary: 156 mg/dL — ABNORMAL HIGH (ref 70–99)
Glucose-Capillary: 147 mg/dL — ABNORMAL HIGH (ref 70–99)

## 2022-03-29 LAB — MAGNESIUM: Magnesium: 1.7 mg/dL (ref 1.7–2.4)

## 2022-03-29 LAB — VAS US ABI WITH/WO TBI
Left ABI: 1.15
Right ABI: 1.2

## 2022-03-29 LAB — HEPARIN LEVEL (UNFRACTIONATED): Heparin Unfractionated: 0.53 IU/mL (ref 0.30–0.70)

## 2022-03-29 MED ORDER — ISOSORBIDE MONONITRATE ER 60 MG PO TB24
60.0000 mg | ORAL_TABLET | Freq: Every day | ORAL | Status: DC
  Administered 2022-03-30: 60 mg via ORAL
  Filled 2022-03-29: qty 1

## 2022-03-29 MED ORDER — ISOSORBIDE MONONITRATE ER 30 MG PO TB24
30.0000 mg | ORAL_TABLET | Freq: Once | ORAL | Status: AC
  Administered 2022-03-29: 30 mg via ORAL
  Filled 2022-03-29: qty 1

## 2022-03-29 MED ORDER — POTASSIUM CHLORIDE CRYS ER 20 MEQ PO TBCR
40.0000 meq | EXTENDED_RELEASE_TABLET | Freq: Once | ORAL | Status: AC
  Administered 2022-03-29: 40 meq via ORAL
  Filled 2022-03-29: qty 2

## 2022-03-29 MED ORDER — MAGNESIUM SULFATE 2 GM/50ML IV SOLN
2.0000 g | Freq: Once | INTRAVENOUS | Status: AC
  Administered 2022-03-29: 2 g via INTRAVENOUS
  Filled 2022-03-29: qty 50

## 2022-03-29 NOTE — Progress Notes (Signed)
ANTICOAGULATION CONSULT NOTE - Follow-up Note  Pharmacy Consult for Heparin Indication: chest pain/ACS  Allergies  Allergen Reactions   Albuterol Anaphylaxis    Per patient and daughter   Meloxicam Itching, Palpitations and Other (See Comments)    Headaches, Mood swings.   Morphine And Related Nausea And Vomiting   Statins Other (See Comments)    Myalgias. Severe arthritic response. Tolerates Lovastatin.    Cholestoff [Plant Sterols And Stanols] Other (See Comments)    Muscle aches   Nsaids Other (See Comments)    Aleve ,  Myalgia   Breo Ellipta [Fluticasone Furoate-Vilanterol]     thrush   Cortisone Hypertension    Patient states cortisone injections he has received for his knees cause hypertension and hyperglycemia   Darvon [Propoxyphene] Other (See Comments)    hallucinate   Doxycycline Cough   Emetrol     Other reaction(s): Hallucinate   Glimepiride Itching   Hydrochlorothiazide     Other reaction(s): Low Blood Pressure   Indomethacin     Neck pain flare   Jardiance [Empagliflozin]     hallucinations   Naproxen     Other reaction(s): Increased Liver Count   Penicillins     *positive allergy test*   Repatha [Evolocumab] Other (See Comments)    hyperglycemia   Sulfa Antibiotics Nausea And Vomiting   Terbinafine Diarrhea   Tramadol Other (See Comments)    Hallucinations     Patient Measurements: Height: 5' 6"$  (167.6 cm) Weight: 66.1 kg (145 lb 12.8 oz) IBW/kg (Calculated) : 63.8 Heparin Dosing Weight: 63.5 kg  Vital Signs: Temp: 98 F (36.7 C) (02/20 0820) Temp Source: Oral (02/20 0820) BP: 148/89 (02/20 0834) Pulse Rate: 78 (02/20 0834)  Labs: Recent Labs    03/27/22 1732 03/27/22 1732 03/27/22 1920 03/28/22 0350 03/28/22 1303 03/28/22 2227 03/29/22 0228  HGB 12.2*  --   --  12.7*  --   --  10.9*  HCT 34.9*  --   --  34.4*  --   --  29.8*  PLT 169  --   --  172  --   --  163  LABPROT 14.3  --   --   --   --   --   --   INR 1.1  --   --   --    --   --   --   HEPARINUNFRC  --    < >  --  0.26* 0.15* 0.59 0.53  CREATININE 1.04  --   --  0.95  --   --   --   TROPONINIHS 198*  --  375*  --   --   --   --    < > = values in this interval not displayed.     Estimated Creatinine Clearance: 54.1 mL/min (by C-G formula based on SCr of 0.95 mg/dL).   Medical History: Past Medical History:  Diagnosis Date   Arthritis    Asthma    only has flareup with smelly perfume    BPH (benign prostatic hyperplasia)    Carotid artery occlusion    right ICA 50% stenosis   Carotodynia    Cataract    immature to the right eye   CHF (congestive heart failure) ([place])    Coronary artery disease    Diabetes mellitus without complication (HCC)    borderline   GERD (gastroesophageal reflux disease)    takes Omeprazole daily   Headache(784.0)    r/t arthritis in neck  Hemorrhoids    History of bronchitis    last time 10-40yr ago   History of colon polyps    Hyperlipidemia    no meds since Jan 1   Hypertension    takes Verapamil,Lisinopril,and Cardura daily   Joint pain    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   03/27/2022   Alirocumab (PRALUENT) 75 MG/ML SOAJ INJECT 75 MG INTO THE SKIN EVERY 14 (FOURTEEN) DAYS. (Patient taking differently: Inject 75 mg into the skin every 14 (fourteen) days. Every other Saturday) 6 mL 3 03/26/2022   aspirin EC 81 MG tablet Take 81 mg by mouth every morning.   03/27/2022 at am   carvedilol (COREG) 12.5 MG tablet Take 1 tablet (12.5 mg total) by mouth 2 (two) times daily. 180 tablet 3 03/27/2022 at 0630   CHLORPHENIRAMINE MALEATE ER PO Take 12 mg by mouth in  the morning and at bedtime.   03/27/2022 at am   Cholecalciferol (VITAMIN D-3) 125 MCG (5000 UT) TABS Take 5,000 Units by mouth daily.   03/26/2022 at am   clopidogrel (PLAVIX) 75 MG tablet TAKE 1 TABLET BY MOUTH EVERY DAY (Patient taking differently: Take 75 mg by mouth daily.) 90 tablet 3 03/27/2022 at 0630   doxazosin (CARDURA) 4 MG tablet Take 1 tablet (4 mg total) by mouth 2 (two) times daily. 180 tablet 3 03/27/2022 at 0630   Krill Oil 500 MG CAPS Take 500 mg by mouth at bedtime.   03/26/2022 at pm   metFORMIN (GLUCOPHAGE-XR) 500 MG 24 hr tablet Take 1 tablet (500 mg total) by mouth 2 (two) times daily with breakfast and lunch.   03/27/2022 at am   pantoprazole (PROTONIX) 40 MG tablet TAKE 1 TABLET BY MOUTH EVERY DAY 90 tablet 3 03/27/2022 at am   sacubitril-valsartan (ENTRESTO) 97-103 MG Take 1 tablet by mouth 2 (two) times daily. 180 tablet 3 03/27/2022 at 1630   spironolactone (ALDACTONE) 25 MG tablet Take 1 tablet (25 mg total) by mouth daily. 90 tablet 0 03/27/2022 at am   [facility] test strip 2 (two) times daily.       Scheduled:   aspirin EC  81 mg Oral Daily   carvedilol  25 mg Oral BID WC   clopidogrel  75 mg Oral Daily   doxazosin  4 mg Oral QHS   insulin aspart  0-6 Units Subcutaneous TID WC   isosorbide mononitrate  30 mg Oral Once   [START ON 03/30/2022] isosorbide mononitrate  60 mg Oral Daily   potassium chloride  40 mEq Oral Once   sacubitril-valsartan  1 tablet Oral BID   spironolactone  25 mg Oral Daily   Infusions:   heparin 1,150 Units/hr (03/29/22 0405)   magnesium sulfate bolus IVPB     PRN: nitroGLYCERIN, ondansetron (ZOFRAN) IV  Assessment: 89yom with a history of CAD,  HF, palpitations, HTN, HLD, T2DM. Patient is presenting as code STEMI. Bedside cardiology evaluation resulted in cancelling code stemi and angiography deferred for now. Heparin per pharmacy consult placed for  chest pain/ACS.   Patient is not on anticoagulation prior to arrival.  IV heparin  1150 uts/hr with heparin level 0.5 at goal. No bleeding noted.  Hgb fell slightly with lab draws - will watch closely pltc stable 160s  Goal of Therapy:  Heparin level 0.3-0.7 units/ml Monitor platelets by anticoagulation protocol: Yes   Plan:  Continue  heparin infusion 1150 units/hr Check anti-Xa level daily while on heparin Continue to monitor H&H and platelets    Bonnita Nasuti Pharm.D. CPP, BCPS Clinical Pharmacist [phone] 03/29/2022 10:20 AM

## 2022-03-29 NOTE — Care Management (Signed)
  Transition of Care [facility]) Screening Note   Patient Details  Name: David Sellers Date of Birth: 05-07-1940   Transition of [facility]) CM/SW Contact:    Bethena Roys, RN Phone Number: 03/29/2022, 3:05 PM    Transition of Care Department [facility]) has reviewed the patient and no TOC needs have been identified at this time. Patient presented for chest pain. We will continue to monitor patient advancement through interdisciplinary progression rounds. If new patient transition needs arise, please place a TOC consult.

## 2022-03-29 NOTE — Progress Notes (Signed)
Rounding Note    Patient Name: TRYGG HOPF Date of Encounter: 03/29/2022  [place] Cardiologist: Quay Burow, MD   Subjective   Patient currently denies any chest, jaw or arm pain. No dyspnea.   Inpatient Medications    Scheduled Meds:  aspirin EC  81 mg Oral Daily   carvedilol  25 mg Oral BID WC   clopidogrel  75 mg Oral Daily   doxazosin  4 mg Oral QHS   insulin aspart  0-6 Units Subcutaneous TID WC   isosorbide mononitrate  30 mg Oral Daily   sacubitril-valsartan  1 tablet Oral BID   spironolactone  25 mg Oral Daily   Continuous Infusions:  heparin 1,150 Units/hr (03/29/22 0405)   PRN Meds: nitroGLYCERIN, ondansetron (ZOFRAN) IV   Vital Signs    Vitals:   03/28/22 1942 03/29/22 0634 03/29/22 0820 03/29/22 0834  BP: (!) 144/84 (!) 155/83 (!) 148/89 (!) 148/89  Pulse: 74 73 69 78  Resp: 18 18 17   $ Temp: 97.7 F (36.5 C) (!) 97.5 F (36.4 C) 98 F (36.7 C)   TempSrc: Oral Oral Oral   SpO2: 99% 98%    Weight:      Height:        Intake/Output Summary (Last 24 hours) at 03/29/2022 1017 Last data filed at 03/29/2022 0405 Gross per 24 hour  Intake 344.32 ml  Output --  Net 344.32 ml      03/28/2022    3:13 PM 03/28/2022    3:00 AM 12/29/2021    9:57 AM  Last 3 Weights  Weight (lbs) 145 lb 12.8 oz 145 lb 141 lb 3.2 oz  Weight (kg) 66.134 kg 65.772 kg 64.048 kg      Telemetry    NSR with frequent PVCs - Personally Reviewed  ECG    NSR rate 89, frequent PVCs. Inferolateral ST depression. Mild ST elevation AVR - Personally Reviewed  Physical Exam   GEN: No acute distress.   Neck: No JVD, left  CEA scar Cardiac: RRR, no murmurs, rubs, or gallops. Femoral and PT tibial pulses 2+ bilateral. DP pulses weak. No bruits.  Respiratory: Clear to auscultation bilaterally. GI: Soft, nontender, non-distended  MS: No edema; No deformity. Neuro:  Nonfocal  Psych: Normal affect   Labs    High Sensitivity Troponin:   Recent Labs  Lab  03/27/22 1732 03/27/22 1920  TROPONINIHS 198* 375*      Chemistry Recent Labs  Lab 03/27/22 1732 03/28/22 0350 03/29/22 0228  NA 139 139  --   K 3.4* 3.2*  --   CL 106 105  --   CO2 19* 22  --   GLUCOSE 194* 177*  --   BUN 13 10  --   CREATININE 1.04 0.95  --   CALCIUM 8.6* 8.9  --   MG  --   --  1.7  PROT 5.8*  --   --   ALBUMIN 3.4*  --   --   AST 21  --   --   ALT 15  --   --   ALKPHOS 32*  --   --   BILITOT 0.9  --   --   GFRNONAA >60 >60  --   ANIONGAP 14 12  --      Lipids  Recent Labs  Lab 03/28/22 0350  CHOL 118  TRIG 101  HDL 41  LDLCALC 57  CHOLHDL 2.9     Hematology Recent Labs  Lab  03/27/22 1732 03/28/22 0350 03/29/22 0228  WBC 5.2 7.2 5.2  RBC 3.60* 3.72* 3.20*  HGB 12.2* 12.7* 10.9*  HCT 34.9* 34.4* 29.8*  MCV 96.9 92.5 93.1  MCH 33.9 34.1* 34.1*  MCHC 35.0 36.9* 36.6*  RDW 12.6 12.6 12.8  PLT 169 172 163    Thyroid No results for input(s): "TSH", "FREET4" in the last 168 hours.  BNP Recent Labs  Lab 03/27/22 1732  BNP 1,638.1*     DDimer No results for input(s): "DDIMER" in the last 168 hours.   Radiology    VAS [place] ABI WITH/WO TBI  Result Date: 03/29/2022  LOWER EXTREMITY DOPPLER STUDY Patient Name:  BRAYDYN DELAROCHA  Date of Exam:   03/28/2022 Medical Rec #: [id number]      Accession #:    [id number] Date of Birth: Jul 15, 1940      Patient Gender: M Patient Age:   38 years Exam Location:  [facility] Procedure:      VAS [place] ABI WITH/WO TBI Referring Phys: Collier Salina [place] --------------------------------------------------------------------------------  Indications: Pain High Risk Factors: Hypertension, hyperlipidemia, Diabetes.  Vascular Interventions: Carotid artery disease status post left CEA. Comparison Study: No prior study Performing Technologist: Sharion Dove RVS  Examination Guidelines: A complete evaluation includes at minimum, Doppler waveform signals and systolic blood pressure reading at the level of bilateral  brachial, anterior tibial, and posterior tibial arteries, when vessel segments are accessible. Bilateral testing is considered an integral part of a complete examination. Photoelectric Plethysmograph (PPG) waveforms and toe systolic pressure readings are included as required and additional duplex testing as needed. Limited examinations for reoccurring indications may be performed as noted.  ABI Findings: +---------+------------------+-----+-----------+--------+ Right    Rt Pressure (mmHg)IndexWaveform   Comment  +---------+------------------+-----+-----------+--------+ Brachial 151                    triphasic           +---------+------------------+-----+-----------+--------+ PTA      185               1.21 multiphasic         +---------+------------------+-----+-----------+--------+ DP       177               1.16 multiphasic         +---------+------------------+-----+-----------+--------+ Great Toe171               1.12                     +---------+------------------+-----+-----------+--------+ +---------+------------------+-----+-----------+-------+ Left     Lt Pressure (mmHg)IndexWaveform   Comment +---------+------------------+-----+-----------+-------+ Brachial 153                    triphasic          +---------+------------------+-----+-----------+-------+ PTA      170               1.11 multiphasic        +---------+------------------+-----+-----------+-------+ DP       176               1.15 multiphasic        +---------+------------------+-----+-----------+-------+ Great Toe139               0.91                    +---------+------------------+-----+-----------+-------+ +-------+-----------+-----------+------------+------------+ ABI/TBIToday's ABIToday's TBIPrevious ABIPrevious TBI +-------+-----------+-----------+------------+------------+ Right  1.2        1.12                                 +-------+-----------+-----------+------------+------------+  Left   1.15       0.91                                +-------+-----------+-----------+------------+------------+  Summary: Right: Resting right ankle-brachial index is within normal range. The right toe-brachial index is normal. Left: Resting left ankle-brachial index is within normal range. The left toe-brachial index is normal. *See table(s) above for measurements and observations.  Electronically signed by Deitra Mayo MD on 03/29/2022 at 8:10:23 AM.    Final    ECHOCARDIOGRAM COMPLETE  Result Date: 03/28/2022    ECHOCARDIOGRAM REPORT   Patient Name:   PADEN HAYDEN Date of Exam: 03/28/2022 Medical Rec #:  [id number]     Height:       66.0 in Accession #:    [id number]    Weight:       145.8 lb Date of Birth:  [birth year]     BSA:          1.748 m Patient Age:    60 years      BP:           128/70 mmHg Patient Gender: M             HR:           82 bpm. Exam Location:  Inpatient Procedure: 2D Echo, Cardiac Doppler, Color Doppler and 3D Echo Indications:    Acute ischemic heart disease, unspecified I24.9  History:        Patient has prior history of Echocardiogram examinations, most                 recent 06/03/2021. CHF, CAD, Carotid Disease and PAD; Risk                 Factors:Non-Smoker, Hypertension, Diabetes and Dyslipidemia.  Sonographer:    Wilkie Aye RVT RCS Referring Phys: [id number] Jessamy Torosyan M [place] IMPRESSIONS  1. Left ventricular ejection fraction, by estimation, is 35 to 40%. The left ventricle has moderately decreased function. The left ventricle demonstrates regional wall motion abnormalities (see scoring diagram/findings for description). There is mild concentric left ventricular hypertrophy. Left ventricular diastolic parameters are consistent with Grade I diastolic dysfunction (impaired relaxation). There is akinesis of the left ventricular, basal-mid lateral wall and inferolateral wall.  2. Right ventricular systolic function is  normal. The right ventricular size is normal.  3. Left atrial size was mildly dilated.  4. The mitral valve is normal in structure. Trivial mitral valve regurgitation. No evidence of mitral stenosis.  5. The aortic valve is tricuspid. There is mild calcification of the aortic valve. Aortic valve regurgitation is not visualized. No aortic stenosis is present.  6. Aortic dilatation noted. There is moderate dilatation of the aortic root, measuring 44 mm.  7. The inferior vena cava is normal in size with greater than 50% respiratory variability, suggesting right atrial pressure of 3 mmHg. FINDINGS  Left Ventricle: Left ventricular ejection fraction, by estimation, is 35 to 40%. The left ventricle has moderately decreased function. The left ventricle demonstrates regional wall motion abnormalities. The left ventricular internal cavity size was normal in size. There is mild concentric left ventricular hypertrophy. Left ventricular diastolic parameters are consistent with Grade I diastolic dysfunction (impaired relaxation). Right Ventricle: The right ventricular size is normal. No increase in right ventricular wall thickness. Right ventricular systolic function is normal. Left Atrium: Left atrial size was mildly dilated. Right Atrium: Right  atrial size was not well visualized. Pericardium: There is no evidence of pericardial effusion. Mitral Valve: The mitral valve is normal in structure. Trivial mitral valve regurgitation. No evidence of mitral valve stenosis. Tricuspid Valve: The tricuspid valve is normal in structure. Tricuspid valve regurgitation is mild . No evidence of tricuspid stenosis. Aortic Valve: The aortic valve is tricuspid. There is mild calcification of the aortic valve. Aortic valve regurgitation is not visualized. No aortic stenosis is present. Aortic valve mean gradient measures 2.0 mmHg. Aortic valve peak gradient measures 3.0 mmHg. Aortic valve area, by VTI measures 3.04 cm. Pulmonic Valve: The  pulmonic valve was normal in structure. Pulmonic valve regurgitation is not visualized. No evidence of pulmonic stenosis. Aorta: Aortic dilatation noted. There is moderate dilatation of the aortic root, measuring 44 mm. Venous: The inferior vena cava is normal in size with greater than 50% respiratory variability, suggesting right atrial pressure of 3 mmHg. IAS/Shunts: No atrial level shunt detected by color flow Doppler.  LEFT VENTRICLE PLAX 2D LVIDd:         5.20 cm LVIDs:         4.10 cm LV PW:         1.30 cm LV IVS:        1.30 cm LVOT diam:     2.10 cm      3D Volume EF: LV SV:         52           3D EF:        37 % LV SV Index:   30           LV EDV:       187 ml LVOT Area:     3.46 cm     LV ESV:       118 ml                             LV SV:        69 ml  LV Volumes (MOD) LV vol d, MOD A2C: 131.0 ml LV vol d, MOD A4C: 137.0 ml LV vol s, MOD A2C: 59.5 ml LV vol s, MOD A4C: 78.9 ml LV SV MOD A2C:     71.5 ml LV SV MOD A4C:     137.0 ml LV SV MOD BP:      65.1 ml RIGHT VENTRICLE             IVC RV Basal diam:  3.60 cm     IVC diam: 1.70 cm RV S prime:     15.90 cm/s TAPSE (M-mode): 2.3 cm LEFT ATRIUM             Index        RIGHT ATRIUM           Index LA diam:        4.40 cm 2.52 cm/m   RA Area:     13.90 cm LA Vol (A2C):   81.6 ml 46.67 ml/m  RA Volume:   32.10 ml  18.36 ml/m LA Vol (A4C):   51.1 ml 29.23 ml/m LA Biplane Vol: 65.0 ml 37.18 ml/m  AORTIC VALVE                    PULMONIC VALVE AV Area (Vmax):    3.01 cm     PV Vmax:       0.75 m/s AV Area (  Vmean):   2.89 cm     PV Peak grad:  2.3 mmHg AV Area (VTI):     3.04 cm AV Vmax:           86.05 cm/s AV Vmean:          65.450 cm/s AV VTI:            0.171 m AV Peak Grad:      3.0 mmHg AV Mean Grad:      2.0 mmHg LVOT Vmax:         74.87 cm/s LVOT Vmean:        54.667 cm/s LVOT VTI:          0.150 m LVOT/AV VTI ratio: 0.88  AORTA Ao Root diam: 4.40 cm Ao Arch diam: 3.3 cm MITRAL VALVE                TRICUSPID VALVE MV Area (PHT): 7.70 cm      TR Peak grad:   56.0 mmHg MV Decel Time: 99 msec      TR Vmax:        374.00 cm/s MV E velocity: 51.77 cm/s MV A velocity: 105.50 cm/s  SHUNTS MV E/A ratio:  0.49         Systemic VTI:  0.15 m                             Systemic Diam: 2.10 cm Glori Bickers MD Electronically signed by Glori Bickers MD Signature Date/Time: 03/28/2022/4:41:27 PM    Final    DG Chest Port 1 View  Result Date: 03/27/2022 CLINICAL DATA:  Table formatting from the original note was not included. Reason for exam: chest pain Per ED triage notes: "Patient [facility] for evaluation of chest pain that started at 1530 today. Pain in left side of chest and jaw. EXAM: PORTABLE CHEST - 1 VIEW COMPARISON:  05/06/2013 FINDINGS: Moderate central pulmonary vascular congestion. Peripheral septal lines at the lung bases. No confluent airspace disease. Mild cardiomegaly.  Aortic Atherosclerosis (ICD10-170.0). No effusion.  No pneumothorax. Visualized bones unremarkable. IMPRESSION: Cardiomegaly with central pulmonary vascular congestion. Electronically Signed   By: Lucrezia Europe M.D.   On: 03/27/2022 17:57    Cardiac Studies   Myoview 07/10/20: Study Highlights    Nuclear stress EF: 25%. The left ventricular ejection fraction is severely decreased (<30%). There was no ST segment deviation noted during stress. No T wave inversion was noted during stress. Findings consistent with prior myocardial infarction. This is a high risk study.   1. There is a large (~15-20% of LV), moderate to severe, predominantly fixed perfusion defect present in the basal to mid inferior/inferolateral segments. There is mild reversibility of the inferior segments. These segments are akinetic, so this favors prior infarction with mild peri-infarct ischemia. 2. Severely reduced LVEF, 25%.  3. Akinesis of the basal to mid inferior/inferolateral segments. Global hypokinesis in other segments.  4. This is a high risk study.    Cardiac cath 09/08/20:   RIGHT/LEFT HEART CATH AND CORONARY ANGIOGRAPHY   Conclusion      Prox RCA lesion is 80% stenosed.   Mid RCA lesion is 95% stenosed.   Ost Cx lesion is 100% stenosed.   Ost LM lesion is 80% stenosed.   Mid LAD lesion is 60% stenosed.   1st Diag lesion is 95% stenosed.   Dist LAD lesion is 70% stenosed.   1. Low  filling pressures.  2. Preserved cardiac output.  3. Severe coronary disease with critical mid RCA stenosis, occluded ostial LCx with collaterals from PDA, and 80% left main stenosis with moderate LAD disease.    I will admit the patient for consultation for CABG.  This is complicated by ischemic cardiomyopathy, though he is well-compensated.   Diagnostic Dominance: Right  Interven Hemo Data  Flowsheet Row Most Recent Value  Fick Cardiac Output 4.81 L/min  Fick Cardiac Output Index 2.83 (L/min)/BSA  RA A Wave 0 mmHg  RA V Wave 0 mmHg  RA Mean -4 mmHg  RV Systolic Pressure 19 mmHg  RV Diastolic Pressure -4 mmHg  RV EDP 2 mmHg  PA Systolic Pressure 19 mmHg  PA Diastolic Pressure 1 mmHg  PA Mean 8 mmHg  PW A Wave 2 mmHg  PW V Wave 2 mmHg  PW Mean 1 mmHg  AO Systolic Pressure [id number] mmHg  AO Diastolic Pressure 51 mmHg  AO Mean 81 mmHg  QP/QS 1  TPVR Index 2.83 HRUI  TSVR Index 28.65 HRUI  TPVR/TSVR Ratio 0.1    Cardiac MRI 09/09/20: CLINICAL DATA:  Ischemic cardiomyopathy, assess viability.   EXAM: CARDIAC MRI   TECHNIQUE: The patient was scanned on a 1.5 Tesla GE magnet. A dedicated cardiac coil was used. Functional imaging was done using Fiesta sequences. 2,3, and 4 chamber views were done to assess for RWMA's. Modified Simpson's rule using a short axis stack was used to calculate an ejection fraction on a dedicated work Conservation officer, nature. The patient received 8 cc of Gadavist. After 10 minutes inversion recovery sequences were used to assess for infiltration and scar tissue.   FINDINGS: Normal left ventricular size and normal wall  thickness. Basal to mid inferolateral akinesis, inferior hypokinesis, mid anterolateral hypokinesis. Images not optimal for EF quantification, estimated LV EF 25-30%. Normal right ventricular size and systolic function. Normal left and right atrial size. No significant mitral regurgitation or tricuspid regurgitation. Trileaflet aortic valve, no significant regurgitation or stenosis.   Delayed enhancement imaging:   25 - 49% wall thickness subendocardial late gadolinium enhancement in the basal to mid inferolateral wall.   IMPRESSION: 1. Normal left ventricular size with wall motion abnormalities as noted above. LV EF 25-30% (images not ideal for quantification by Simpson's rule).   2.  Normal RV size and systolic function.   3. There is significant myocardial viability. There is non-transmural scar only in the basal to mid inferolateral wall. May see significant LV functional improvement with full revascularization.   Dalton Mclean     Electronically Signed   By: Loralie Champagne M.D.   On: 09/09/2020 14:20    Echo 06/03/21: IMPRESSIONS     1. Left ventricular ejection fraction, by estimation, is 35 to 40%. Left  ventricular ejection fraction by 3D volume is 35 %. The left ventricle has  moderately decreased function. The left ventricle has no regional wall  motion abnormalities. Left  ventricular diastolic parameters are consistent with Grade I diastolic  dysfunction (impaired relaxation). There is akinesis of the left  ventricular, entire inferolateral wall. The average left ventricular  global longitudinal strain is -12.4 %. The global   longitudinal strain is abnormal.   2. Right ventricular systolic function is normal. The right ventricular  size is normal. There is moderately elevated pulmonary artery systolic  pressure.   3. The mitral valve is normal in structure. Trivial mitral valve  regurgitation. No evidence of mitral stenosis.   4. The  aortic valve is  tricuspid. Aortic valve regurgitation is not  visualized. Aortic valve sclerosis/calcification is present, without any  evidence of aortic stenosis.   5. There is mild dilatation of the aortic root, measuring 40 mm.   6. The inferior vena cava is normal in size with greater than 50%  respiratory variability, suggesting right atrial pressure of 3 mmHg   7. Compared to echo dated 06/24/2020, LVF has improved.   Carotid US 11/22/21: IMPRESSIONS     1. Left ventricular ejection fraction, by estimation, is 35 to 40%. Left  ventricular ejection fraction by 3D volume is 35 %. The left ventricle has  moderately decreased function. The left ventricle has no regional wall  motion abnormalities. Left  ventricular diastolic parameters are consistent with Grade I diastolic  dysfunction (impaired relaxation). There is akinesis of the left  ventricular, entire inferolateral wall. The average left ventricular  global longitudinal strain is -12.4 %. The global   longitudinal strain is abnormal.   2. Right ventricular systolic function is normal. The right ventricular  size is normal. There is moderately elevated pulmonary artery systolic  pressure.   3. The mitral valve is normal in structure. Trivial mitral valve  regurgitation. No evidence of mitral stenosis.   4. The aortic valve is tricuspid. Aortic valve regurgitation is not  visualized. Aortic valve sclerosis/calcification is present, without any  evidence of aortic stenosis.   5. There is mild dilatation of the aortic root, measuring 40 mm.   6. The inferior vena cava is normal in size with greater than 50%  respiratory variability, suggesting right atrial pressure of 3 mmHg   7. Compared to echo dated 06/24/2020, LVF has improved.   LE arterial dopplers: 03/28/22: Summary:  Right: Resting right ankle-brachial index is within normal range. The  right toe-brachial index is normal.   Left: Resting left ankle-brachial index is within normal  range. The left  toe-brachial index is normal.    *See table(s) above for measurements and observations.      Electronically signed by Deitra Mayo MD on 03/29/2022 at 8:10:23   Echo 03/28/22: IMPRESSIONS     1. Left ventricular ejection fraction, by estimation, is 35 to 40%. The  left ventricle has moderately decreased function. The left ventricle  demonstrates regional wall motion abnormalities (see scoring  diagram/findings for description). There is mild  concentric left ventricular hypertrophy. Left ventricular diastolic  parameters are consistent with Grade I diastolic dysfunction (impaired  relaxation). There is akinesis of the left ventricular, basal-mid lateral  wall and inferolateral wall.   2. Right ventricular systolic function is normal. The right ventricular  size is normal.   3. Left atrial size was mildly dilated.   4. The mitral valve is normal in structure. Trivial mitral valve  regurgitation. No evidence of mitral stenosis.   5. The aortic valve is tricuspid. There is mild calcification of the  aortic valve. Aortic valve regurgitation is not visualized. No aortic  stenosis is present.   6. Aortic dilatation noted. There is moderate dilatation of the aortic  root, measuring 44 mm.   7. The inferior vena cava is normal in size with greater than 50%  respiratory variability, suggesting right atrial pressure of 3 mmHg.    Patient Profile     82 y.o. male  with CAD, chronic systolic heart failure, ICM, palpitations, carotid artery stenosis s/p left CEA, hypertension, hyperlipidemia, type 2 diabetes, and musical ear syndrome  who is being seen 03/28/2022 for the  evaluation of chest pain.   Assessment & Plan    NSTEMI. Patient has known severe CAD by cath in August 2022 with 80% ostial left main, moderate mid LAD, occluded ostial LCx and severe tandem lesions in the proximal and mid RCA. Vessels are severely calcified. Patient seen in 2022 by CT surgery.  Patient opted for medical therapy. He is still active as a Theme park manager. Walks 10 minutes on flat without problems. 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 Recent onset symptoms of SOB, jaw, bilateral arm and chest pain. Severely hypertensive. Symptoms may be due to demand ischemia in setting of hypertension versus progression of his CAD. Currently he is comfortable and BP improved. We discussed his known CAD. He is aware and desires to continue medical therapy without any invasive evaluation. He has a standing DNR in place and states he is ready if it is his time. Echo is unchanged with EF 35-40%. Plan to increase Coreg and  Imdur added. Will continue DAPT with ASA and Plavix. Continue IV heparin for 48 hours. Will increase Imdur today HTN. Continue aldactone, Entresto. Increase Coreg and  Imdur. Will follow. Continue cardura.  Hypokalemia. Replete Chronic combined systolic/diastolic CHF. Last EF 34-40%. Stable  Volume status looks good. HLD on Praluent DM on metformin. Will hold for now and monitor.  7.   Left calf pain LE arterial dopplers looked OK.       For questions or updates, please contact [place] Please consult [email or web address] for contact info under        Signed, Selden Noteboom [place], MD  03/29/2022, 10:17 AM

## 2022-03-30 DIAGNOSIS — E118 Type 2 diabetes mellitus with unspecified complications: Secondary | ICD-10-CM | POA: Insufficient documentation

## 2022-03-30 DIAGNOSIS — I214 Non-ST elevation (NSTEMI) myocardial infarction: Secondary | ICD-10-CM | POA: Diagnosis not present

## 2022-03-30 DIAGNOSIS — I5022 Chronic systolic (congestive) heart failure: Secondary | ICD-10-CM

## 2022-03-30 LAB — BASIC METABOLIC PANEL
Anion gap: 8 (ref 5–15)
BUN: 12 mg/dL (ref 8–23)
CO2: 23 mmol/L (ref 22–32)
Calcium: 8.6 mg/dL — ABNORMAL LOW (ref 8.9–10.3)
Chloride: 106 mmol/L (ref 98–111)
Creatinine, Ser: 1.06 mg/dL (ref 0.61–1.24)
GFR, Estimated: >60 mL/min (ref >60)
Glucose, Bld: 193 mg/dL — ABNORMAL HIGH (ref 70–99)
Potassium: 3.6 mmol/L (ref 3.5–5.1)
Sodium: 137 mmol/L (ref 135–145)

## 2022-03-30 LAB — CBC
HCT: 29.2 % — ABNORMAL LOW (ref 39.0–52.0)
Hemoglobin: 10.1 g/dL — ABNORMAL LOW (ref 13.0–17.0)
MCH: 33.2 pg (ref 26.0–34.0)
MCHC: 34.6 g/dL (ref 30.0–36.0)
MCV: 96.1 fL (ref 80.0–100.0)
Platelets: 158 10*3/uL (ref 150–400)
RBC: 3.04 MIL/uL — ABNORMAL LOW (ref 4.22–5.81)
RDW: 13.1 % (ref 11.5–15.5)
WBC: 3.9 10*3/uL — ABNORMAL LOW (ref 4.0–10.5)
nRBC: 0 % (ref 0.0–0.2)

## 2022-03-30 LAB — GLUCOSE, CAPILLARY: Glucose-Capillary: 198 mg/dL — ABNORMAL HIGH (ref 70–99)

## 2022-03-30 LAB — MAGNESIUM: Magnesium: 2.2 mg/dL (ref 1.7–2.4)

## 2022-03-30 LAB — HEPARIN LEVEL (UNFRACTIONATED): Heparin Unfractionated: 0.53 IU/mL (ref 0.30–0.70)

## 2022-03-30 MED ORDER — DOXAZOSIN MESYLATE 8 MG PO TABS
8.0000 mg | ORAL_TABLET | Freq: Every day | ORAL | Status: DC
  Filled 2022-03-30: qty 1

## 2022-03-30 MED ORDER — DOXAZOSIN MESYLATE 8 MG PO TABS: 8.0000 mg | ORAL_TABLET | Freq: Every day | ORAL | 0 refills | Status: DC

## 2022-03-30 MED ORDER — NITROGLYCERIN 0.4 MG SL SUBL: 0.4000 mg | SUBLINGUAL_TABLET | SUBLINGUAL | 2 refills | Status: DC | PRN

## 2022-03-30 MED ORDER — CARVEDILOL 25 MG PO TABS: 25.0000 mg | ORAL_TABLET | Freq: Two times a day (BID) | ORAL | 1 refills | Status: DC

## 2022-03-30 MED ORDER — POTASSIUM CHLORIDE CRYS ER 20 MEQ PO TBCR
40.0000 meq | EXTENDED_RELEASE_TABLET | Freq: Once | ORAL | Status: AC
  Administered 2022-03-30: 40 meq via ORAL
  Filled 2022-03-30: qty 2

## 2022-03-30 MED ORDER — ISOSORBIDE MONONITRATE ER 60 MG PO TB24: 60.0000 mg | ORAL_TABLET | Freq: Every day | ORAL | 1 refills | Status: DC

## 2022-03-30 NOTE — Progress Notes (Signed)
Rounding Note    Patient Name: David Sellers Date of Encounter: 03/30/2022  [place] Cardiologist: Quay Burow, MD   Subjective   Feeling well this morning. No chest pain. Hopeful to DC home.   Inpatient Medications    Scheduled Meds:  aspirin EC  81 mg Oral Daily   carvedilol  25 mg Oral BID WC   clopidogrel  75 mg Oral Daily   doxazosin  4 mg Oral QHS   insulin aspart  0-6 Units Subcutaneous TID WC   isosorbide mononitrate  60 mg Oral Daily   sacubitril-valsartan  1 tablet Oral BID   spironolactone  25 mg Oral Daily   Continuous Infusions:  heparin 1,150 Units/hr (03/29/22 2200)   PRN Meds: nitroGLYCERIN, ondansetron (ZOFRAN) IV   Vital Signs    Vitals:   03/29/22 1147 03/29/22 1854 03/29/22 2039 03/30/22 0500  BP: (!) 150/86 131/80 (!) 151/78 (!) 142/84  Pulse: 60 68 64 78  Resp: 17  18 18  $ Temp: 98.2 F (36.8 C)  98.1 F (36.7 C) 97.6 F (36.4 C)  TempSrc: Oral  Oral Oral  SpO2:   97% 99%  Weight:      Height:        Intake/Output Summary (Last 24 hours) at 03/30/2022 0812 Last data filed at 03/29/2022 2200 Gross per 24 hour  Intake 240.01 ml  Output --  Net 240.01 ml      03/28/2022    3:13 PM 03/28/2022    3:00 AM 12/29/2021    9:57 AM  Last 3 Weights  Weight (lbs) 145 lb 12.8 oz 145 lb 141 lb 3.2 oz  Weight (kg) 66.134 kg 65.772 kg 64.048 kg      Telemetry    Sinus Rhythm - Personally Reviewed  ECG    No new tracing this morning  Physical Exam   GEN: No acute distress.   Neck: No JVD Cardiac: RRR, no murmurs, rubs, or gallops.  Respiratory: Clear to auscultation bilaterally. GI: Soft, nontender, non-distended  MS: No edema; No deformity. Neuro:  Nonfocal  Psych: Normal affect   Labs    High Sensitivity Troponin:   Recent Labs  Lab 03/27/22 1732 03/27/22 1920  TROPONINIHS 198* 375*     Chemistry Recent Labs  Lab 03/27/22 1732 03/28/22 0350 03/29/22 0228 03/30/22 0321  NA 139 139  --  137  K 3.4*  3.2*  --  3.6  CL 106 105  --  106  CO2 19* 22  --  23  GLUCOSE 194* 177*  --  193*  BUN 13 10  --  12  CREATININE 1.04 0.95  --  1.06  CALCIUM 8.6* 8.9  --  8.6*  MG  --   --  1.7 2.2  PROT 5.8*  --   --   --   ALBUMIN 3.4*  --   --   --   AST 21  --   --   --   ALT 15  --   --   --   ALKPHOS 32*  --   --   --   BILITOT 0.9  --   --   --   GFRNONAA >60 >60  --  >60  ANIONGAP 14 12  --  8    Lipids  Recent Labs  Lab 03/28/22 0350  CHOL 118  TRIG 101  HDL 41  LDLCALC 57  CHOLHDL 2.9    Hematology Recent Labs  Lab 03/28/22 0350  03/29/22 0228 03/30/22 0321  WBC 7.2 5.2 3.9*  RBC 3.72* 3.20* 3.04*  HGB 12.7* 10.9* 10.1*  HCT 34.4* 29.8* 29.2*  MCV 92.5 93.1 96.1  MCH 34.1* 34.1* 33.2  MCHC 36.9* 36.6* 34.6  RDW 12.6 12.8 13.1  PLT 172 163 158   Thyroid No results for input(s): "TSH", "FREET4" in the last 168 hours.  BNP Recent Labs  Lab 03/27/22 1732  BNP 1,638.1*    DDimer No results for input(s): "DDIMER" in the last 168 hours.   Radiology    VAS [place] ABI WITH/WO TBI  Result Date: 03/29/2022  LOWER EXTREMITY DOPPLER STUDY Patient Name:  David Sellers  Date of Exam:   03/28/2022 Medical Rec #: [id number]      Accession #:    [id number] Date of Birth: [birth year]      Patient Gender: M Patient Age:   51 years Exam Location:  [facility] Procedure:      VAS [place] ABI WITH/WO TBI Referring Phys: Collier Salina [place] --------------------------------------------------------------------------------  Indications: Pain High Risk Factors: Hypertension, hyperlipidemia, Diabetes.  Vascular Interventions: Carotid artery disease status post left CEA. Comparison Study: No prior study Performing Technologist: Sharion Dove RVS  Examination Guidelines: A complete evaluation includes at minimum, Doppler waveform signals and systolic blood pressure reading at the level of bilateral brachial, anterior tibial, and posterior tibial arteries, when vessel segments are accessible. Bilateral  testing is considered an integral part of a complete examination. Photoelectric Plethysmograph (PPG) waveforms and toe systolic pressure readings are included as required and additional duplex testing as needed. Limited examinations for reoccurring indications may be performed as noted.  ABI Findings: +---------+------------------+-----+-----------+--------+ Right    Rt Pressure (mmHg)IndexWaveform   Comment  +---------+------------------+-----+-----------+--------+ Brachial 151                    triphasic           +---------+------------------+-----+-----------+--------+ PTA      185               1.21 multiphasic         +---------+------------------+-----+-----------+--------+ DP       177               1.16 multiphasic         +---------+------------------+-----+-----------+--------+ Great Toe171               1.12                     +---------+------------------+-----+-----------+--------+ +---------+------------------+-----+-----------+-------+ Left     Lt Pressure (mmHg)IndexWaveform   Comment +---------+------------------+-----+-----------+-------+ Brachial 153                    triphasic          +---------+------------------+-----+-----------+-------+ PTA      170               1.11 multiphasic        +---------+------------------+-----+-----------+-------+ DP       176               1.15 multiphasic        +---------+------------------+-----+-----------+-------+ Great Toe139               0.91                    +---------+------------------+-----+-----------+-------+ +-------+-----------+-----------+------------+------------+ ABI/TBIToday's ABIToday's TBIPrevious ABIPrevious TBI +-------+-----------+-----------+------------+------------+ Right  1.2        1.12                                +-------+-----------+-----------+------------+------------+  Left   1.15       0.91                                 +-------+-----------+-----------+------------+------------+  Summary: Right: Resting right ankle-brachial index is within normal range. The right toe-brachial index is normal. Left: Resting left ankle-brachial index is within normal range. The left toe-brachial index is normal. *See table(s) above for measurements and observations.  Electronically signed by Deitra Mayo MD on 03/29/2022 at 8:10:23 AM.    Final    ECHOCARDIOGRAM COMPLETE  Result Date: 03/28/2022    ECHOCARDIOGRAM REPORT   Patient Name:   David Sellers Date of Exam: 03/28/2022 Medical Rec #:  [id number]     Height:       66.0 in Accession #:    [id number]    Weight:       145.8 lb Date of Birth:  Dec 10, 1940     BSA:          1.748 m Patient Age:    23 years      BP:           128/70 mmHg Patient Gender: M             HR:           82 bpm. Exam Location:  Inpatient Procedure: 2D Echo, Cardiac Doppler, Color Doppler and 3D Echo Indications:    Acute ischemic heart disease, unspecified I24.9  History:        Patient has prior history of Echocardiogram examinations, most                 recent 06/03/2021. CHF, CAD, Carotid Disease and PAD; Risk                 Factors:Non-Smoker, Hypertension, Diabetes and Dyslipidemia.  Sonographer:    Wilkie Aye RVT RCS Referring Phys: [id number] PETER M [place] IMPRESSIONS  1. Left ventricular ejection fraction, by estimation, is 35 to 40%. The left ventricle has moderately decreased function. The left ventricle demonstrates regional wall motion abnormalities (see scoring diagram/findings for description). There is mild concentric left ventricular hypertrophy. Left ventricular diastolic parameters are consistent with Grade I diastolic dysfunction (impaired relaxation). There is akinesis of the left ventricular, basal-mid lateral wall and inferolateral wall.  2. Right ventricular systolic function is normal. The right ventricular size is normal.  3. Left atrial size was mildly dilated.  4. The mitral valve is normal in  structure. Trivial mitral valve regurgitation. No evidence of mitral stenosis.  5. The aortic valve is tricuspid. There is mild calcification of the aortic valve. Aortic valve regurgitation is not visualized. No aortic stenosis is present.  6. Aortic dilatation noted. There is moderate dilatation of the aortic root, measuring 44 mm.  7. The inferior vena cava is normal in size with greater than 50% respiratory variability, suggesting right atrial pressure of 3 mmHg. FINDINGS  Left Ventricle: Left ventricular ejection fraction, by estimation, is 35 to 40%. The left ventricle has moderately decreased function. The left ventricle demonstrates regional wall motion abnormalities. The left ventricular internal cavity size was normal in size. There is mild concentric left ventricular hypertrophy. Left ventricular diastolic parameters are consistent with Grade I diastolic dysfunction (impaired relaxation). Right Ventricle: The right ventricular size is normal. No increase in right ventricular wall thickness. Right ventricular systolic function is normal. Left Atrium: Left atrial size was mildly dilated. Right Atrium:  Right atrial size was not well visualized. Pericardium: There is no evidence of pericardial effusion. Mitral Valve: The mitral valve is normal in structure. Trivial mitral valve regurgitation. No evidence of mitral valve stenosis. Tricuspid Valve: The tricuspid valve is normal in structure. Tricuspid valve regurgitation is mild . No evidence of tricuspid stenosis. Aortic Valve: The aortic valve is tricuspid. There is mild calcification of the aortic valve. Aortic valve regurgitation is not visualized. No aortic stenosis is present. Aortic valve mean gradient measures 2.0 mmHg. Aortic valve peak gradient measures 3.0 mmHg. Aortic valve area, by VTI measures 3.04 cm. Pulmonic Valve: The pulmonic valve was normal in structure. Pulmonic valve regurgitation is not visualized. No evidence of pulmonic stenosis.  Aorta: Aortic dilatation noted. There is moderate dilatation of the aortic root, measuring 44 mm. Venous: The inferior vena cava is normal in size with greater than 50% respiratory variability, suggesting right atrial pressure of 3 mmHg. IAS/Shunts: No atrial level shunt detected by color flow Doppler.  LEFT VENTRICLE PLAX 2D LVIDd:         5.20 cm LVIDs:         4.10 cm LV PW:         1.30 cm LV IVS:        1.30 cm LVOT diam:     2.10 cm      3D Volume EF: LV SV:         52           3D EF:        37 % LV SV Index:   30           LV EDV:       187 ml LVOT Area:     3.46 cm     LV ESV:       118 ml                             LV SV:        69 ml  LV Volumes (MOD) LV vol d, MOD A2C: 131.0 ml LV vol d, MOD A4C: 137.0 ml LV vol s, MOD A2C: 59.5 ml LV vol s, MOD A4C: 78.9 ml LV SV MOD A2C:     71.5 ml LV SV MOD A4C:     137.0 ml LV SV MOD BP:      65.1 ml RIGHT VENTRICLE             IVC RV Basal diam:  3.60 cm     IVC diam: 1.70 cm RV S prime:     15.90 cm/s TAPSE (M-mode): 2.3 cm LEFT ATRIUM             Index        RIGHT ATRIUM           Index LA diam:        4.40 cm 2.52 cm/m   RA Area:     13.90 cm LA Vol (A2C):   81.6 ml 46.67 ml/m  RA Volume:   32.10 ml  18.36 ml/m LA Vol (A4C):   51.1 ml 29.23 ml/m LA Biplane Vol: 65.0 ml 37.18 ml/m  AORTIC VALVE                    PULMONIC VALVE AV Area (Vmax):    3.01 cm     PV Vmax:       0.75 m/s AV  Area (Vmean):   2.89 cm     PV Peak grad:  2.3 mmHg AV Area (VTI):     3.04 cm AV Vmax:           86.05 cm/s AV Vmean:          65.450 cm/s AV VTI:            0.171 m AV Peak Grad:      3.0 mmHg AV Mean Grad:      2.0 mmHg LVOT Vmax:         74.87 cm/s LVOT Vmean:        54.667 cm/s LVOT VTI:          0.150 m LVOT/AV VTI ratio: 0.88  AORTA Ao Root diam: 4.40 cm Ao Arch diam: 3.3 cm MITRAL VALVE                TRICUSPID VALVE MV Area (PHT): 7.70 cm     TR Peak grad:   56.0 mmHg MV Decel Time: 99 msec      TR Vmax:        374.00 cm/s MV E velocity: 51.77 cm/s MV A  velocity: 105.50 cm/s  SHUNTS MV E/A ratio:  0.49         Systemic VTI:  0.15 m                             Systemic Diam: 2.10 cm Glori Bickers MD Electronically signed by Glori Bickers MD Signature Date/Time: 03/28/2022/4:41:27 PM    Final     Cardiac Studies   Echo: 03/28/2022  IMPRESSIONS     1. Left ventricular ejection fraction, by estimation, is 35 to 40%. The  left ventricle has moderately decreased function. The left ventricle  demonstrates regional wall motion abnormalities (see scoring  diagram/findings for description). There is mild  concentric left ventricular hypertrophy. Left ventricular diastolic  parameters are consistent with Grade I diastolic dysfunction (impaired  relaxation). There is akinesis of the left ventricular, basal-mid lateral  wall and inferolateral wall.   2. Right ventricular systolic function is normal. The right ventricular  size is normal.   3. Left atrial size was mildly dilated.   4. The mitral valve is normal in structure. Trivial mitral valve  regurgitation. No evidence of mitral stenosis.   5. The aortic valve is tricuspid. There is mild calcification of the  aortic valve. Aortic valve regurgitation is not visualized. No aortic  stenosis is present.   6. Aortic dilatation noted. There is moderate dilatation of the aortic  root, measuring 44 mm.   7. The inferior vena cava is normal in size with greater than 50%  respiratory variability, suggesting right atrial pressure of 3 mmHg.   FINDINGS   Left Ventricle: Left ventricular ejection fraction, by estimation, is 35  to 40%. The left ventricle has moderately decreased function. The left  ventricle demonstrates regional wall motion abnormalities. The left  ventricular internal cavity size was  normal in size. There is mild concentric left ventricular hypertrophy.  Left ventricular diastolic parameters are consistent with Grade I  diastolic dysfunction (impaired relaxation).   Right  Ventricle: The right ventricular size is normal. No increase in  right ventricular wall thickness. Right ventricular systolic function is  normal.   Left Atrium: Left atrial size was mildly dilated.   Right Atrium: Right atrial size was not well visualized.   Pericardium: There is no  evidence of pericardial effusion.   Mitral Valve: The mitral valve is normal in structure. Trivial mitral  valve regurgitation. No evidence of mitral valve stenosis.   Tricuspid Valve: The tricuspid valve is normal in structure. Tricuspid  valve regurgitation is mild . No evidence of tricuspid stenosis.   Aortic Valve: The aortic valve is tricuspid. There is mild calcification  of the aortic valve. Aortic valve regurgitation is not visualized. No  aortic stenosis is present. Aortic valve mean gradient measures 2.0 mmHg.  Aortic valve peak gradient measures  3.0 mmHg. Aortic valve area, by VTI measures 3.04 cm.   Pulmonic Valve: The pulmonic valve was normal in structure. Pulmonic valve  regurgitation is not visualized. No evidence of pulmonic stenosis.   Aorta: Aortic dilatation noted. There is moderate dilatation of the aortic  root, measuring 44 mm.   Venous: The inferior vena cava is normal in size with greater than 50%  respiratory variability, suggesting right atrial pressure of 3 mmHg.   IAS/Shunts: No atrial level shunt detected by color flow Doppler.    Patient Profile     82 y.o. male  with CAD, chronic systolic heart failure, ICM, palpitations, carotid artery stenosis s/p left CEA, hypertension, hyperlipidemia, type 2 diabetes, and musical ear syndrome  who is being seen 03/28/2022 for the evaluation of chest pain.   Assessment & Plan    NSTEMI -- Known severe CAD by cath 09/2020 with 80% distal left main, moderate mid LAD, occluded ostial circumflex and severe tandem lesions in the proximal/mid RCA.  He was seen by CT surgery and opted for medical therapy at that time. hsTn peaked at  375.  On presentation to the ED he was severely hypertensive.  It was felt symptoms could be due to demand ischemia in the setting of severe hypertension versus progression of his CAD.  Chest pain since admission with improved BP.  He continues to opt for medical therapy without invasive evaluation.  He was treated with IV heparin for 48 hours. Stop heparin, and ambulate this morning. -- Continue DAPT with aspirin/Plavix, Coreg 25 mg twice daily, Imdur 60 mg daily, Entresto 97-103 twice daily, spiro 25 mg daily  HTN -- Improved -- Continue Coreg 25 mg twice daily, Entresto 97-103 twice daily, spironolactone 25 mg daily, Cardura, Imdur  HLD -- On Praluent  HFrEF ICM -- Known LVEF of 35 to 40%, remains volume stable -- GDMT: Coreg, Entresto, spironolactone, intolerant to SGLT2i  DM -- SSI while inpatient, metformin held   For questions or updates, please contact [place] Please consult [email or web address] for contact info under        Signed, Reino Bellis, NP  03/30/2022, 8:12 AM

## 2022-03-30 NOTE — Discharge Summary (Signed)
Discharge Summary    Patient ID: David Sellers MRN: [id number]; DOB: [birth year]  Admit date: 03/27/2022 Discharge date: 03/30/2022  PCP:  [name], [place] [facility] Cardiologist:  Quay Burow, MD      Discharge Diagnoses    Principal Problem:   NSTEMI (non-ST elevated myocardial infarction) [facility]) Active Problems:   Essential hypertension   Hyperlipidemia   HFrEF (heart failure with reduced ejection fraction) ([place])   Type 2 diabetes mellitus with complication, without long-term current use of insulin ([place])  Diagnostic Studies/Procedures    Echo: 03/28/2022   IMPRESSIONS     1. Left ventricular ejection fraction, by estimation, is 35 to 40%. The  left ventricle has moderately decreased function. The left ventricle  demonstrates regional wall motion abnormalities (see scoring  diagram/findings for description). There is mild  concentric left ventricular hypertrophy. Left ventricular diastolic  parameters are consistent with Grade I diastolic dysfunction (impaired  relaxation). There is akinesis of the left ventricular, basal-mid lateral  wall and inferolateral wall.   2. Right ventricular systolic function is normal. The right ventricular  size is normal.   3. Left atrial size was mildly dilated.   4. The mitral valve is normal in structure. Trivial mitral valve  regurgitation. No evidence of mitral stenosis.   5. The aortic valve is tricuspid. There is mild calcification of the  aortic valve. Aortic valve regurgitation is not visualized. No aortic  stenosis is present.   6. Aortic dilatation noted. There is moderate dilatation of the aortic  root, measuring 44 mm.   7. The inferior vena cava is normal in size with greater than 50%  respiratory variability, suggesting right atrial pressure of 3 mmHg.   FINDINGS   Left Ventricle: Left ventricular ejection fraction, by estimation, is 35  to 40%. The left ventricle has moderately decreased  function. The left  ventricle demonstrates regional wall motion abnormalities. The left  ventricular internal cavity size was  normal in size. There is mild concentric left ventricular hypertrophy.  Left ventricular diastolic parameters are consistent with Grade I  diastolic dysfunction (impaired relaxation).   Right Ventricle: The right ventricular size is normal. No increase in  right ventricular wall thickness. Right ventricular systolic function is  normal.   Left Atrium: Left atrial size was mildly dilated.   Right Atrium: Right atrial size was not well visualized.   Pericardium: There is no evidence of pericardial effusion.   Mitral Valve: The mitral valve is normal in structure. Trivial mitral  valve regurgitation. No evidence of mitral valve stenosis.   Tricuspid Valve: The tricuspid valve is normal in structure. Tricuspid  valve regurgitation is mild . No evidence of tricuspid stenosis.   Aortic Valve: The aortic valve is tricuspid. There is mild calcification  of the aortic valve. Aortic valve regurgitation is not visualized. No  aortic stenosis is present. Aortic valve mean gradient measures 2.0 mmHg.  Aortic valve peak gradient measures  3.0 mmHg. Aortic valve area, by VTI measures 3.04 cm.   Pulmonic Valve: The pulmonic valve was normal in structure. Pulmonic valve  regurgitation is not visualized. No evidence of pulmonic stenosis.   Aorta: Aortic dilatation noted. There is moderate dilatation of the aortic  root, measuring 44 mm.   Venous: The inferior vena cava is normal in size with greater than 50%  respiratory variability, suggesting right atrial pressure of 3 mmHg.   IAS/Shunts: No atrial level shunt detected by color flow Doppler.  _____________  History of Present Illness     David Sellers is a 82 y.o. male with  CAD, chronic systolic heart failure, ICM, palpitations, carotid artery stenosis s/p left CEA, hypertension, hyperlipidemia, type 2 diabetes,  and musical ear syndrome  who was seen 03/28/2022 for the evaluation of chest pain.   David Sellers has known history of multivessel CAD diagnosed in 2022. At that time it was determined that he had 80% stenosis of the oLM, CTO Lcx, mLAD 60%, dLAD 70% and 95% RCA. CT surgery was consulted and he was deemed too high risk for CABG. PCI was also being considered at the time but patient declined. Medical management was pursued.   Patient has been feeling shortness of breath over the past week. This correlates with his rise in BP readings at home as per wife. Yesterday he presented to the ER complaining of chest pain at rest, sudden onset, radiating to both arms and jaw. This improved with sublingual NTG.  Upon arrival to the ER his ECG showed NSR with PVCs, aVR elevation and diffuse ST depressions (more pronounced in leads V2-V6). BP was 164/100 mmHh, HR 53, RR 14. Troponins elevated from 198 -> 375. Normal renal function. Patient on ASA and plavix at home.  Hospital Course     NSTEMI -- Known severe CAD by cath 09/2020 with 80% distal left main, moderate mid LAD, occluded ostial circumflex and severe tandem lesions in the proximal/mid RCA.  He was seen by CT surgery and opted for medical therapy at that time. hsTn peaked at 375.  On presentation to the ED he was severely hypertensive.  It was felt symptoms could be due to demand ischemia in the setting of severe hypertension versus progression of his CAD.  No chest pain since admission with improved BP.  He continues to opt for medical therapy without invasive evaluation. He was treated with IV heparin for 48 hours. -- Continue DAPT with aspirin/Plavix, Coreg 25 mg twice daily, Imdur 60 mg daily, Entresto 97-103 twice daily, spiro 25 mg daily   HTN -- Improved -- Continue Coreg 25 mg twice daily, Entresto 97-103 twice daily, spironolactone 25 mg daily, Cardura (increased to 1m daily), Imdur   HLD -- On Praluent   HFrEF ICM -- Known LVEF of 35 to  40%, remains volume stable -- GDMT: Coreg, Entresto, spironolactone, intolerant to SGLT2i   DM -- SSI while inpatient -- metformin resumed at discharge    Did the patient have an acute coronary syndrome (MI, NSTEMI, STEMI, etc) this admission?:  No.   The elevated Troponin was due to the acute medical illness (demand ischemia).       The patient will be scheduled for a TOC follow up appointment in 10-14 days.  [facility] the office where the patient should be seen for follow up.  _____________  Discharge Vitals Blood pressure (!) 160/80, pulse 74, temperature 97.6 F (36.4 C), temperature source Oral, resp. rate 18, height 5' 6"$  (1.676 m), weight 66.1 kg, SpO2 99 %.  Filed Weights   03/28/22 0300 03/28/22 1513  Weight: 65.8 kg 66.1 kg    Labs & Radiologic Studies    CBC Recent Labs    03/27/22 1732 03/28/22 0350 03/29/22 0228 03/30/22 0321  WBC 5.2 7.2 5.2 3.9*  NEUTROABS 4.0 5.8  --   --   HGB 12.2* 12.7* 10.9* 10.1*  HCT 34.9* 34.4* 29.8* 29.2*  MCV 96.9 92.5 93.1  96.1  PLT 169 172 163 0000000   Basic Metabolic Panel Recent Labs    03/28/22 0350 03/29/22 0228 03/30/22 0321  NA 139  --  137  K 3.2*  --  3.6  CL 105  --  106  CO2 22  --  23  GLUCOSE 177*  --  193*  BUN 10  --  12  CREATININE 0.95  --  1.06  CALCIUM 8.9  --  8.6*  MG  --  1.7 2.2   Liver Function Tests Recent Labs    03/27/22 1732  AST 21  ALT 15  ALKPHOS 32*  BILITOT 0.9  PROT 5.8*  ALBUMIN 3.4*   No results for input(s): "LIPASE", "AMYLASE" in the last 72 hours. High Sensitivity Troponin:   Recent Labs  Lab 03/27/22 1732 03/27/22 1920  TROPONINIHS 198* 375*    BNP Invalid input(s): "POCBNP" D-Dimer No results for input(s): "DDIMER" in the last 72 hours. Hemoglobin A1C No results for input(s): "HGBA1C" in the last 72 hours. Fasting Lipid Panel Recent Labs    03/28/22 0350  CHOL 118  HDL 41  LDLCALC 57  TRIG 101   CHOLHDL 2.9   Thyroid Function Tests No results for input(s): "TSH", "T4TOTAL", "T3FREE", "THYROIDAB" in the last 72 hours.  Invalid input(s): "FREET3" _____________  VAS [place] ABI WITH/WO TBI  Result Date: 03/29/2022  LOWER EXTREMITY DOPPLER STUDY Patient Name:  David Sellers  Date of Exam:   03/28/2022 Medical Rec #: [id number]      Accession #:    [id number] Date of Birth: [birth year]      Patient Gender: M Patient Age:   45 years Exam Location:  [facility] Procedure:      VAS [place] ABI WITH/WO TBI Referring Phys: Collier Salina [place] --------------------------------------------------------------------------------  Indications: Pain High Risk Factors: Hypertension, hyperlipidemia, Diabetes.  Vascular Interventions: Carotid artery disease status post left CEA. Comparison Study: No prior study Performing Technologist: Sharion Dove RVS  Examination Guidelines: A complete evaluation includes at minimum, Doppler waveform signals and systolic blood pressure reading at the level of bilateral brachial, anterior tibial, and posterior tibial arteries, when vessel segments are accessible. Bilateral testing is considered an integral part of a complete examination. Photoelectric Plethysmograph (PPG) waveforms and toe systolic pressure readings are included as required and additional duplex testing as needed. Limited examinations for reoccurring indications may be performed as noted.  ABI Findings: +---------+------------------+-----+-----------+--------+ Right    Rt Pressure (mmHg)IndexWaveform   Comment  +---------+------------------+-----+-----------+--------+ Brachial 151                    triphasic           +---------+------------------+-----+-----------+--------+ PTA      185               1.21 multiphasic         +---------+------------------+-----+-----------+--------+ DP       177               1.16 multiphasic         +---------+------------------+-----+-----------+--------+ Great  Toe171               1.12                     +---------+------------------+-----+-----------+--------+ +---------+------------------+-----+-----------+-------+ Left     Lt Pressure (mmHg)IndexWaveform   Comment +---------+------------------+-----+-----------+-------+ Brachial 153                    triphasic          +---------+------------------+-----+-----------+-------+  PTA      170               1.11 multiphasic        +---------+------------------+-----+-----------+-------+ DP       176               1.15 multiphasic        +---------+------------------+-----+-----------+-------+ Great Toe139               0.91                    +---------+------------------+-----+-----------+-------+ +-------+-----------+-----------+------------+------------+ ABI/TBIToday's ABIToday's TBIPrevious ABIPrevious TBI +-------+-----------+-----------+------------+------------+ Right  1.2        1.12                                +-------+-----------+-----------+------------+------------+ Left   1.15       0.91                                +-------+-----------+-----------+------------+------------+  Summary: Right: Resting right ankle-brachial index is within normal range. The right toe-brachial index is normal. Left: Resting left ankle-brachial index is within normal range. The left toe-brachial index is normal. *See table(s) above for measurements and observations.  Electronically signed by Deitra Mayo MD on 03/29/2022 at 8:10:23 AM.    Final    ECHOCARDIOGRAM COMPLETE  Result Date: 03/28/2022    ECHOCARDIOGRAM REPORT   Patient Name:   David Sellers Date of Exam: 03/28/2022 Medical Rec #:  [id number]     Height:       66.0 in Accession #:    [id number]    Weight:       145.8 lb Date of Birth:  1940-04-12     BSA:          1.748 m Patient Age:    86 years      BP:           128/70 mmHg Patient Gender: M             HR:           82 bpm. Exam Location:  Inpatient  Procedure: 2D Echo, Cardiac Doppler, Color Doppler and 3D Echo Indications:    Acute ischemic heart disease, unspecified I24.9  History:        Patient has prior history of Echocardiogram examinations, most                 recent 06/03/2021. CHF, CAD, Carotid Disease and PAD; Risk                 Factors:Non-Smoker, Hypertension, Diabetes and Dyslipidemia.  Sonographer:    Wilkie Aye RVT RCS Referring Phys: [id number] PETER M [place] IMPRESSIONS  1. Left ventricular ejection fraction, by estimation, is 35 to 40%. The left ventricle has moderately decreased function. The left ventricle demonstrates regional wall motion abnormalities (see scoring diagram/findings for description). There is mild concentric left ventricular hypertrophy. Left ventricular diastolic parameters are consistent with Grade I diastolic dysfunction (impaired relaxation). There is akinesis of the left ventricular, basal-mid lateral wall and inferolateral wall.  2. Right ventricular systolic function is normal. The right ventricular size is normal.  3. Left atrial size was mildly dilated.  4. The mitral valve is normal in structure. Trivial mitral valve regurgitation. No evidence of mitral stenosis.  5. The aortic valve is  tricuspid. There is mild calcification of the aortic valve. Aortic valve regurgitation is not visualized. No aortic stenosis is present.  6. Aortic dilatation noted. There is moderate dilatation of the aortic root, measuring 44 mm.  7. The inferior vena cava is normal in size with greater than 50% respiratory variability, suggesting right atrial pressure of 3 mmHg. FINDINGS  Left Ventricle: Left ventricular ejection fraction, by estimation, is 35 to 40%. The left ventricle has moderately decreased function. The left ventricle demonstrates regional wall motion abnormalities. The left ventricular internal cavity size was normal in size. There is mild concentric left ventricular hypertrophy. Left ventricular diastolic parameters are  consistent with Grade I diastolic dysfunction (impaired relaxation). Right Ventricle: The right ventricular size is normal. No increase in right ventricular wall thickness. Right ventricular systolic function is normal. Left Atrium: Left atrial size was mildly dilated. Right Atrium: Right atrial size was not well visualized. Pericardium: There is no evidence of pericardial effusion. Mitral Valve: The mitral valve is normal in structure. Trivial mitral valve regurgitation. No evidence of mitral valve stenosis. Tricuspid Valve: The tricuspid valve is normal in structure. Tricuspid valve regurgitation is mild . No evidence of tricuspid stenosis. Aortic Valve: The aortic valve is tricuspid. There is mild calcification of the aortic valve. Aortic valve regurgitation is not visualized. No aortic stenosis is present. Aortic valve mean gradient measures 2.0 mmHg. Aortic valve peak gradient measures 3.0 mmHg. Aortic valve area, by VTI measures 3.04 cm. Pulmonic Valve: The pulmonic valve was normal in structure. Pulmonic valve regurgitation is not visualized. No evidence of pulmonic stenosis. Aorta: Aortic dilatation noted. There is moderate dilatation of the aortic root, measuring 44 mm. Venous: The inferior vena cava is normal in size with greater than 50% respiratory variability, suggesting right atrial pressure of 3 mmHg. IAS/Shunts: No atrial level shunt detected by color flow Doppler.  LEFT VENTRICLE PLAX 2D LVIDd:         5.20 cm LVIDs:         4.10 cm LV PW:         1.30 cm LV IVS:        1.30 cm LVOT diam:     2.10 cm      3D Volume EF: LV SV:         52           3D EF:        37 % LV SV Index:   30           LV EDV:       187 ml LVOT Area:     3.46 cm     LV ESV:       118 ml                             LV SV:        69 ml  LV Volumes (MOD) LV vol d, MOD A2C: 131.0 ml LV vol d, MOD A4C: 137.0 ml LV vol s, MOD A2C: 59.5 ml LV vol s, MOD A4C: 78.9 ml LV SV MOD A2C:     71.5 ml LV SV MOD A4C:     137.0 ml LV SV MOD  BP:      65.1 ml RIGHT VENTRICLE             IVC RV Basal diam:  3.60 cm     IVC diam: 1.70 cm RV S prime:  15.90 cm/s TAPSE (M-mode): 2.3 cm LEFT ATRIUM             Index        RIGHT ATRIUM           Index LA diam:        4.40 cm 2.52 cm/m   RA Area:     13.90 cm LA Vol (A2C):   81.6 ml 46.67 ml/m  RA Volume:   32.10 ml  18.36 ml/m LA Vol (A4C):   51.1 ml 29.23 ml/m LA Biplane Vol: 65.0 ml 37.18 ml/m  AORTIC VALVE                    PULMONIC VALVE AV Area (Vmax):    3.01 cm     PV Vmax:       0.75 m/s AV Area (Vmean):   2.89 cm     PV Peak grad:  2.3 mmHg AV Area (VTI):     3.04 cm AV Vmax:           86.05 cm/s AV Vmean:          65.450 cm/s AV VTI:            0.171 m AV Peak Grad:      3.0 mmHg AV Mean Grad:      2.0 mmHg LVOT Vmax:         74.87 cm/s LVOT Vmean:        54.667 cm/s LVOT VTI:          0.150 m LVOT/AV VTI ratio: 0.88  AORTA Ao Root diam: 4.40 cm Ao Arch diam: 3.3 cm MITRAL VALVE                TRICUSPID VALVE MV Area (PHT): 7.70 cm     TR Peak grad:   56.0 mmHg MV Decel Time: 99 msec      TR Vmax:        374.00 cm/s MV E velocity: 51.77 cm/s MV A velocity: 105.50 cm/s  SHUNTS MV E/A ratio:  0.49         Systemic VTI:  0.15 m                             Systemic Diam: 2.10 cm Glori Bickers MD Electronically signed by Glori Bickers MD Signature Date/Time: 03/28/2022/4:41:27 PM    Final    DG Chest Port 1 View  Result Date: 03/27/2022 CLINICAL DATA:  Table formatting from the original note was not included. Reason for exam: chest pain Per ED triage notes: "Patient [facility] for evaluation of chest pain that started at 1530 today. Pain in left side of chest and jaw. EXAM: PORTABLE CHEST - 1 VIEW COMPARISON:  05/06/2013 FINDINGS: Moderate central pulmonary vascular congestion. Peripheral septal lines at the lung bases. No confluent airspace disease. Mild cardiomegaly.  Aortic Atherosclerosis (ICD10-170.0). No effusion.  No pneumothorax. Visualized bones unremarkable.  IMPRESSION: Cardiomegaly with central pulmonary vascular congestion. Electronically Signed   By: Lucrezia Europe M.D.   On: 03/27/2022 17:57   Disposition   Pt is being discharged home today in good condition.  Follow-up Plans & Appointments     Follow-up Information     Lenna Sciara, NP Follow up on 04/08/2022.   Specialties: Cardiology, Family Medicine Why: at 2:20pm for your follow up appt with Dr. Rachel BoRaquel Sarna NP Contact information: [address]  [place] [address] [phone]                   Discharge Medications   Allergies as of 03/30/2022       Reactions   Albuterol Anaphylaxis   Per patient and daughter   Meloxicam Itching, Palpitations, Other (See Comments)   Headaches, Mood swings.   Morphine And Related Nausea And Vomiting   Statins Other (See Comments)   Myalgias. Severe arthritic response. Tolerates Lovastatin.    Cholestoff [plant Sterols And Stanols] Other (See Comments)   Muscle aches   Nsaids Other (See Comments)   Aleve ,  Myalgia   Breo Ellipta [fluticasone Furoate-vilanterol]    thrush   Cortisone Hypertension   Patient states cortisone injections he has received for his knees cause hypertension and hyperglycemia   Darvon [propoxyphene] Other (See Comments)   hallucinate   Doxycycline Cough   Emetrol    Other reaction(s): Hallucinate   Glimepiride Itching   Hydrochlorothiazide    Other reaction(s): Low Blood Pressure   Indomethacin    Neck pain flare   Jardiance [empagliflozin]    hallucinations   Naproxen    Other reaction(s): Increased Liver Count   Penicillins    *positive allergy test*   Repatha [evolocumab] Other (See Comments)   hyperglycemia   Sulfa Antibiotics Nausea And Vomiting   Terbinafine Diarrhea   Tramadol Other (See Comments)   Hallucinations        Medication List     TAKE these medications    aspirin EC 81 MG tablet Take 81 mg by mouth every morning.   carvedilol 25 MG  tablet Commonly known as: COREG Take 1 tablet (25 mg total) by mouth 2 (two) times daily with a meal. What changed:  medication strength how much to take when to take this   CHLORPHENIRAMINE MALEATE ER PO Take 12 mg by mouth in the morning and at bedtime.   clopidogrel 75 MG tablet Commonly known as: PLAVIX TAKE 1 TABLET BY MOUTH EVERY DAY   doxazosin 8 MG tablet Commonly known as: CARDURA Take 1 tablet (8 mg total) by mouth at bedtime. What changed:  medication strength how much to take when to take this   Entresto 97-103 MG Generic drug: sacubitril-valsartan Take 1 tablet by mouth 2 (two) times daily.   isosorbide mononitrate 60 MG 24 hr tablet Commonly known as: IMDUR Take 1 tablet (60 mg total) by mouth daily. Start taking on: March 31, 2022   Krill Oil 500 MG Caps Take 500 mg by mouth at bedtime.   metFORMIN 500 MG 24 hr tablet Commonly known as: GLUCOPHAGE-XR Take 1 tablet (500 mg total) by mouth 2 (two) times daily with breakfast and lunch.   nitroGLYCERIN 0.4 MG SL tablet Commonly known as: NITROSTAT Place 1 tablet (0.4 mg total) under the tongue every 5 (five) minutes x 3 doses as needed for chest pain.   OneTouch Verio test strip Generic drug: glucose blood 2 (two) times daily.   pantoprazole 40 MG tablet Commonly known as: PROTONIX TAKE 1 TABLET BY MOUTH EVERY DAY   Praluent 75 MG/ML Soaj Generic drug: Alirocumab INJECT 75 MG INTO THE SKIN EVERY 14 (FOURTEEN) DAYS. What changed: additional instructions   spironolactone 25 MG tablet Commonly known as: ALDACTONE Take 1 tablet (25 mg total) by mouth daily.   TYLENOL ARTHRITIS PAIN PO Take 1,300 mg by mouth in the morning and at bedtime.   Vitamin D-3 125 MCG (5000 UT) Tabs Take  5,000 Units by mouth daily.         Outstanding Labs/Studies   BMET at follow up appt  Duration of Discharge Encounter   Greater than 30 minutes including physician time.  Signed, Reino Bellis,  NP 03/30/2022, 10:39 AM

## 2022-03-30 NOTE — Plan of Care (Signed)

## 2022-03-30 NOTE — Progress Notes (Signed)
ANTICOAGULATION CONSULT NOTE - Follow-up Note  Pharmacy Consult for Heparin Indication: chest pain/ACS  Allergies  Allergen Reactions   Albuterol Anaphylaxis    Per patient and daughter   Meloxicam Itching, Palpitations and Other (See Comments)    Headaches, Mood swings.   Morphine And Related Nausea And Vomiting   Statins Other (See Comments)    Myalgias. Severe arthritic response. Tolerates Lovastatin.    Cholestoff [Plant Sterols And Stanols] Other (See Comments)    Muscle aches   Nsaids Other (See Comments)    Aleve ,  Myalgia   Breo Ellipta [Fluticasone Furoate-Vilanterol]     thrush   Cortisone Hypertension    Patient states cortisone injections he has received for his knees cause hypertension and hyperglycemia   Darvon [Propoxyphene] Other (See Comments)    hallucinate   Doxycycline Cough   Emetrol     Other reaction(s): Hallucinate   Glimepiride Itching   Hydrochlorothiazide     Other reaction(s): Low Blood Pressure   Indomethacin     Neck pain flare   Jardiance [Empagliflozin]     hallucinations   Naproxen     Other reaction(s): Increased Liver Count   Penicillins     *positive allergy test*   Repatha [Evolocumab] Other (See Comments)    hyperglycemia   Sulfa Antibiotics Nausea And Vomiting   Terbinafine Diarrhea   Tramadol Other (See Comments)    Hallucinations     Patient Measurements: Height: 5' 6"$  (167.6 cm) Weight: 66.1 kg (145 lb 12.8 oz) IBW/kg (Calculated) : 63.8 Heparin Dosing Weight: 63.5 kg  Vital Signs: Temp: 97.6 F (36.4 C) (02/21 0500) Temp Source: Oral (02/21 0500) BP: 142/84 (02/21 0500) Pulse Rate: 78 (02/21 0500)  Labs: Recent Labs    03/27/22 1732 03/27/22 1920 03/28/22 0350 03/28/22 1303 03/28/22 2227 03/29/22 0228 03/30/22 0321 03/30/22 0410  HGB 12.2*  --  12.7*  --   --  10.9* 10.1*  --   HCT 34.9*  --  34.4*  --   --  29.8* 29.2*  --   PLT 169  --  172  --   --  163 158  --   LABPROT 14.3  --   --   --   --    --   --   --   INR 1.1  --   --   --   --   --   --   --   HEPARINUNFRC  --   --  0.26*   < > 0.59 0.53  --  0.53  CREATININE 1.04  --  0.95  --   --   --  1.06  --   TROPONINIHS 198* 375*  --   --   --   --   --   --    < > = values in this interval not displayed.     Estimated Creatinine Clearance: 48.5 mL/min (by C-G formula based on SCr of 1.06 mg/dL).   Medical History: Past Medical History:  Diagnosis Date   Arthritis    Asthma    only has flareup with smelly perfume    BPH (benign prostatic hyperplasia)    Carotid artery occlusion    right ICA 50% stenosis   Carotodynia    Cataract    immature to the right eye   CHF (congestive heart failure) ([place])    Coronary artery disease    Diabetes mellitus without complication ([place])    borderline   GERD (gastroesophageal  reflux disease)    takes Omeprazole daily   Headache(784.0)    r/t arthritis in neck    Hemorrhoids    History of bronchitis    last time 10-59yr ago   History of colon polyps    Hyperlipidemia    no meds since Jan 1   Hypertension    takes Verapamil,Lisinopril,and Cardura daily   Joint pain    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   03/27/2022   Alirocumab (PRALUENT) 75 MG/ML SOAJ INJECT 75 MG INTO THE SKIN EVERY 14 (FOURTEEN) DAYS. (Patient taking differently: Inject 75 mg into the skin every 14 (fourteen) days. Every other Saturday) 6 mL 3 03/26/2022   aspirin EC 81 MG tablet Take 81 mg by mouth every morning.   03/27/2022 at am   carvedilol (COREG) 12.5 MG tablet Take 1 tablet (12.5 mg total) by mouth 2 (two) times daily. 180 tablet 3 03/27/2022 at 0630    CHLORPHENIRAMINE MALEATE ER PO Take 12 mg by mouth in the morning and at bedtime.   03/27/2022 at am   Cholecalciferol (VITAMIN D-3) 125 MCG (5000 UT) TABS Take 5,000 Units by mouth daily.   03/26/2022 at am   clopidogrel (PLAVIX) 75 MG tablet TAKE 1 TABLET BY MOUTH EVERY DAY (Patient taking differently: Take 75 mg by mouth daily.) 90 tablet 3 03/27/2022 at 0630   doxazosin (CARDURA) 4 MG tablet Take 1 tablet (4 mg total) by mouth 2 (two) times daily. 180 tablet 3 03/27/2022 at 0630   Krill Oil 500 MG CAPS Take 500 mg by mouth at bedtime.   03/26/2022 at pm   metFORMIN (GLUCOPHAGE-XR) 500 MG 24 hr tablet Take 1 tablet (500 mg total) by mouth 2 (two) times daily with breakfast and lunch.   03/27/2022 at am   pantoprazole (PROTONIX) 40 MG tablet TAKE 1 TABLET BY MOUTH EVERY DAY 90 tablet 3 03/27/2022 at am   sacubitril-valsartan (ENTRESTO) 97-103 MG Take 1 tablet by mouth 2 (two) times daily. 180 tablet 3 03/27/2022 at 1630   spironolactone (ALDACTONE) 25 MG tablet Take 1 tablet (25 mg total) by mouth daily. 90 tablet 0 03/27/2022 at am   [facility] test strip 2 (two) times daily.       Scheduled:   aspirin EC  81 mg Oral Daily   carvedilol  25 mg Oral BID WC   clopidogrel  75 mg Oral Daily   doxazosin  4 mg Oral QHS   insulin aspart  0-6 Units Subcutaneous TID WC   isosorbide mononitrate  60 mg Oral Daily   sacubitril-valsartan  1 tablet Oral BID   spironolactone  25 mg Oral Daily   Infusions:   heparin 1,150 Units/hr (03/29/22 2200)   PRN: nitroGLYCERIN, ondansetron (ZOFRAN) IV  Assessment: 89yom with a history of CAD,  HF, palpitations, HTN, HLD, T2DM. Patient is presenting as code STEMI. Bedside cardiology evaluation resulted in cancelling code STEMI and angiography deferred for now. Heparin per pharmacy consult placed for chest pain/ACS.   Patient is not on  anticoagulation prior to arrival.  Heparin level came back therapeutic at 0.53, on 1150 units/hr. Hgb 10.1, plt 158. No s/sx of  bleeding or infusion issues.   Goal of Therapy:  Heparin level 0.3-0.7 units/ml Monitor platelets by anticoagulation protocol: Yes   Plan:  Given has been 48 hours on heparin, will discontinue at this time  Antonietta Jewel, PharmD, [place] Pharmacist  Phone: [phone] 03/30/2022 8:14 AM  Please check AMION for all [facility] phone numbers After 10:00 PM, call [place] [phone]

## 2022-04-08 ENCOUNTER — Ambulatory Visit: Payer: PPO | Attending: Cardiology | Admitting: Cardiology

## 2022-04-08 ENCOUNTER — Encounter: Payer: Self-pay | Admitting: Cardiology

## 2022-04-08 ENCOUNTER — Ambulatory Visit: Payer: PPO | Admitting: Nurse Practitioner

## 2022-04-08 VITALS — BP 136/64 | HR 54 | Ht 66.0 in | Wt 146.8 lb

## 2022-04-08 DIAGNOSIS — I6523 Occlusion and stenosis of bilateral carotid arteries: Secondary | ICD-10-CM | POA: Diagnosis not present

## 2022-04-08 DIAGNOSIS — E118 Type 2 diabetes mellitus with unspecified complications: Secondary | ICD-10-CM | POA: Diagnosis not present

## 2022-04-08 DIAGNOSIS — R002 Palpitations: Secondary | ICD-10-CM

## 2022-04-08 DIAGNOSIS — I2510 Atherosclerotic heart disease of native coronary artery without angina pectoris: Secondary | ICD-10-CM

## 2022-04-08 DIAGNOSIS — E785 Hyperlipidemia, unspecified: Secondary | ICD-10-CM

## 2022-04-08 NOTE — Progress Notes (Signed)
Cardiology Office Note   Date:  04/08/2022   ID:  Sellers, David 09-28-1940, MRN [id number]  PCP:  [name], DO  Cardiologist:   Quay Burow, MD   Chief Complaint  Patient presents with   Chest Pain      History of Present Illness: David Sellers is a 82 y.o. male with the past medical history including CAD, chronic systolic heart failure, ICM, palpitations, carotid artery stenosis s/p left CEA, hypertension, hyperlipidemia, type 2 diabetes, and musical ear syndrome.   Prior Myoview in 2015 in the setting of preop clearance was low risk.  Cardiac catheterization in 09/2020 revealed high-grade tear of the proximal and mid RCA stenoses, occlusion of left circumflex artery, 80% ostial left main with moderate segmental proximal LAD disease.  He was deemed a poor candidate for CABG, high risk PCI candidate.  Cardiac MRI at the time did show viability, however, medical therapy was recommended.  He is intolerant to statins and is on Praluent.  He has a history of ICM, chronic systolic heart failure, intolerant to Jardiance/Farxiga.  Echocardiogram in 05/2021 showed EF 35 to 40%, moderate increase in pulmonary pressures.  Prior sleep study was negative for OSA.  He was last seen in the office on 09/06/2021 and noted symptoms of atypical chest discomfort, palpitations.  Reports suspicious for muscle spasm versus PVCs.  He declined 3-day Zio patch.  Carotid Dopplers in 11/2021 1 to 35% or ICA stenosis, 1 to [id number] LICA stenosis s/p endarterectomy, right subclavian artery stenosis.  Follow-up study was recommended in 12 months.  He presents today for follow-up as an add.  He was in the emergency room with chest discomfort.  This was felt to be unstable angina.  He had some subtle ST depression more pronounced than previous EKGs.  He was hypertensive.  His troponins did go to 375.  He was decided to manage him conservatively in part because he had nonrevascularizable vessels a couple of years ago on  cath and in part because he preferred this.  His Coreg was increased.  It looks like his Cardura was increased.  Imdur was added.  Prior to this his blood pressure been running high.  He is actually been controlled and actually was in the 0000000 systolic today.  He did not feel lightheaded.  He has not had any new shortness of breath, PND or orthopnea.  He has had none of the chest discomfort that he was having.  He has had no weight gain or edema.  Of note I did review his hospital records.  He did have an echo in the hospital his EF was 35 to 40%.  He does have aortic dilatation of 44 mm.  There is mild calcification of the aortic valve.   Past Medical History:  Diagnosis Date   Arthritis    Asthma    only has flareup with smelly perfume    BPH (benign prostatic hyperplasia)    Carotid artery occlusion    right ICA 50% stenosis   Carotodynia    Cataract    immature to the right eye   CHF (congestive heart failure) ([place])    Coronary artery disease    Diabetes mellitus without complication ([facility])    borderline   GERD (gastroesophageal reflux disease)    takes Omeprazole daily   Headache(784.0)    r/t arthritis in neck    Hemorrhoids    History of bronchitis    last time 10-30yr ago  History of colon polyps    Hyperlipidemia    no meds since Jan 1   Hypertension    takes Verapamil,Lisinopril,and Cardura daily   Joint pain    Legally blind in left eye, as defined in [place]    hit by a baseball   Low back pain    reason unknown   Pneumonia    3 times in a row   Prolapsed hemorrhoids    Urinary frequency    sees Dr.Tannenbaum    Past Surgical History:  Procedure Laterality Date   CAROTID ENDARTERECTOMY Left 05/08/13   COLONOSCOPY     EGD with dilitation     ENDARTERECTOMY Left 05/08/2013   Procedure: LEFT CAROTID ARTERY ENDARTERECTOMY WITH DACRON PATCH ANGIOPLASTY;  Surgeon: Elam Dutch, MD;  Location: [facility];  Service: Vascular;  Laterality: Left;   EYE SURGERY      HEMORRHOID SURGERY N/A 08/13/2019   Procedure: OPEN HEMORRHOIDECTOMY;  Surgeon: Armandina Gemma, MD;  Location: [facility];  Service: General;  Laterality: N/A;  LMA   HERNIA REPAIR     x 2   INGUINAL HERNIA REPAIR Right 11/09/2020   Procedure: OPEN REPAIR RIGHT INGUINAL HERNIA;  Surgeon: Armandina Gemma, MD;  Location: [facility];  Service: General;  Laterality: Right;   INSERTION OF MESH Right 11/09/2020   Procedure: INSERTION OF MESH;  Surgeon: Armandina Gemma, MD;  Location: [facility];  Service: General;  Laterality: Right;   RETINAL DETACHMENT SURGERY     retinal laser surgery Left    plate placed   RIGHT/LEFT HEART CATH AND CORONARY ANGIOGRAPHY N/A 09/08/2020   Procedure: RIGHT/LEFT HEART CATH AND CORONARY ANGIOGRAPHY;  Surgeon: [name], MD;  Location: [facility];  Service: Cardiovascular;  Laterality: N/A;     Current Outpatient Medications  Medication Sig Dispense Refill   Acetaminophen (TYLENOL ARTHRITIS PAIN PO) Take 650 mg by mouth as needed.     Alirocumab (PRALUENT) 75 MG/ML SOAJ INJECT 75 MG INTO THE SKIN EVERY 14 (FOURTEEN) DAYS. (Patient taking differently: Inject 75 mg into the skin every 14 (fourteen) days. Every other Saturday) 6 mL 3   aspirin EC 81 MG tablet Take 81 mg by mouth every morning.     Carboxymethylcellulose Sodium (EYE DROPS OP) Apply to eye as needed (Dry eyes). Thera tears     carvedilol (COREG) 25 MG tablet Take 1 tablet (25 mg total) by mouth 2 (two) times daily with a meal. 180 tablet 1   cetirizine (ZYRTEC) 10 MG tablet Take 10 mg by mouth 2 (two) times daily.     Chlorpheniramine Maleate (CHLOR-TRIMETON ALLERGY PO) Take by mouth as needed (Allergies).     Cholecalciferol (VITAMIN D-3) 125 MCG (5000 UT) TABS Take 5,000 Units by mouth daily.     clopidogrel (PLAVIX) 75 MG tablet TAKE 1 TABLET BY MOUTH EVERY DAY (Patient taking differently: Take 75 mg by mouth daily.) 90 tablet 3   doxazosin (CARDURA) 4 MG tablet Take 4 mg by mouth daily.      isosorbide mononitrate (IMDUR) 60 MG 24 hr tablet Take 1 tablet (60 mg total) by mouth daily. 90 tablet 1   Krill Oil 500 MG CAPS Take 500 mg by mouth at bedtime.     metFORMIN (GLUCOPHAGE-XR) 500 MG 24 hr tablet Take 1 tablet (500 mg total) by mouth 2 (two) times daily with breakfast and lunch.     nitroGLYCERIN (NITROSTAT) 0.4 MG SL tablet Place 1 tablet (0.4 mg total) under  the tongue every 5 (five) minutes x 3 doses as needed for chest pain. 25 tablet 2   ONETOUCH VERIO test strip 2 (two) times daily.     pantoprazole (PROTONIX) 40 MG tablet TAKE 1 TABLET BY MOUTH EVERY DAY 90 tablet 3   sacubitril-valsartan (ENTRESTO) 97-103 MG Take 1 tablet by mouth 2 (two) times daily. 180 tablet 3   spironolactone (ALDACTONE) 25 MG tablet Take 1 tablet (25 mg total) by mouth daily. 90 tablet 0   No current facility-administered medications for this visit.    Allergies:   Albuterol, Meloxicam, Morphine and related, Statins, Cholestoff [plant sterols and stanols], Nsaids, Breo ellipta [fluticasone furoate-vilanterol], Cortisone, Darvon [propoxyphene], Doxycycline, Emetrol, Glimepiride, Hydrochlorothiazide, Indomethacin, Jardiance [empagliflozin], Naproxen, Penicillins, Repatha [evolocumab], Sulfa antibiotics, Terbinafine, and Tramadol    ROS:  Please see the history of present illness.   Otherwise, review of systems are positive for none.   All other systems are reviewed and negative.    PHYSICAL EXAM: VS:  BP 136/64   Pulse (!) 54   Ht '5\' 6"'$  ([id number] m)   Wt 146 lb 12.8 oz (66.6 kg)   SpO2 96%   BMI 23.69 kg/m  , BMI Body mass index is 23.69 kg/m. GENERAL:  Well appearing NECK:  No jugular venous distention, waveform within normal limits, carotid upstroke brisk and symmetric, right carotid bruits, no thyromegaly LUNGS:  Clear to auscultation bilaterally CHEST:  Unremarkable HEART:  PMI not displaced or sustained,S1 and S2 within normal limits, no S3, no S4, no clicks, no rubs, no murmurs ABD:   Flat, positive bowel sounds normal in frequency in pitch, no bruits, no rebound, no guarding, no midline pulsatile mass, no hepatomegaly, no splenomegaly EXT:  2 plus pulses throughout, no edema, no cyanosis no clubbin    EKG:  EKG is not ordered today. The ekg ordered 07/10/2022 demonstrates sinus rhythm, rate 65, leftward axis, no acute ST-T wave changes.   Recent Labs: 03/27/2022: ALT 15; B Natriuretic Peptide 1,638.1 03/30/2022: BUN 12; Creatinine, Ser 1.06; Hemoglobin 10.1; Magnesium 2.2; Platelets 158; Potassium 3.6; Sodium 137    Lipid Panel    Component Value Date/Time   CHOL 118 03/28/2022 0350   CHOL 124 12/28/2020 1007   TRIG 101 03/28/2022 0350   HDL 41 03/28/2022 0350   HDL 49 12/28/2020 1007   CHOLHDL 2.9 03/28/2022 0350   VLDL 20 03/28/2022 0350   LDLCALC 57 03/28/2022 0350   LDLCALC 44 12/28/2020 1007      Wt Readings from Last 3 Encounters:  04/08/22 146 lb 12.8 oz (66.6 kg)  03/28/22 145 lb 12.8 oz (66.1 kg)  12/29/21 141 lb 3.2 oz (64 kg)      Other studies Reviewed: Additional studies/ records that were reviewed today include: Hospital records. Review of the above records demonstrates:  Please see elsewhere in the note.     ASSESSMENT AND PLAN:  CAD:   He is doing better with the medical management as listed.  Blood pressure is running low but he seems to be tolerating this.  No change in therapy.  Chronic systolic heart failure/ICM:   He seems to be euvolemic.  No change in the therapy.  He has been intolerant of SGLT2 inhibitors.   Carotid artery disease: He has this routinely followed by Dr. Alvester Chou.    Hypertension: His blood pressure is actually running low.  For now he seems to be tolerating the med changes but we might have to back off on the meds if  his blood pressure continues to run low when he becomes symptomatic.  I invited him to let us know about that.  Hyperlipidemia: LDL was 57.  No change in therapy.    Type 2 diabetes: A1c was 6.5.  Management per [name], DO    Disposition: Follow-up with Dr. Gwenlyn Found.    Current medicines are reviewed at length with the patient today.  The patient does not have concerns regarding medicines.  The following changes have been made:  no change  Labs/ tests ordered today include: None No orders of the defined types were placed in this encounter.    Disposition:   FU with as above.     Signed, Minus Breeding, MD  04/08/2022 12:28 PM    [facility]

## 2022-04-08 NOTE — Patient Instructions (Signed)
    Follow-Up: At [facility], you and your health needs are our priority.  As part of our continuing mission to provide you with exceptional heart care, we have created designated Provider Care Teams.  These Care Teams include your primary Cardiologist (physician) and Advanced Practice Providers (APPs -  Physician Assistants and Nurse Practitioners) who all work together to provide you with the care you need, when you need it.  We recommend signing up for the patient portal called "MyChart".  Sign up information is provided on this After Visit Summary.  MyChart is used to connect with patients for Virtual Visits (Telemedicine).  Patients are able to view lab/test results, encounter notes, upcoming appointments, etc.  Non-urgent messages can be sent to your provider as well.   To learn more about what you can do with MyChart, go to [email or web address].    Your next appointment:    As scheduled

## 2022-04-30 ENCOUNTER — Other Ambulatory Visit (HOSPITAL_COMMUNITY): Payer: Self-pay | Admitting: Cardiology

## 2022-05-31 ENCOUNTER — Ambulatory Visit: Payer: PPO | Attending: Cardiovascular Disease | Admitting: Cardiovascular Disease

## 2022-05-31 ENCOUNTER — Encounter: Payer: Self-pay | Admitting: Cardiovascular Disease

## 2022-05-31 VITALS — BP 184/92 | HR 59 | Ht 66.0 in | Wt 145.4 lb

## 2022-05-31 DIAGNOSIS — I10 Essential (primary) hypertension: Secondary | ICD-10-CM

## 2022-05-31 DIAGNOSIS — I2510 Atherosclerotic heart disease of native coronary artery without angina pectoris: Secondary | ICD-10-CM

## 2022-05-31 DIAGNOSIS — I5020 Unspecified systolic (congestive) heart failure: Secondary | ICD-10-CM

## 2022-05-31 NOTE — Assessment & Plan Note (Signed)
History of hyperlipidemia on Praluent with lipid profile performed 03/28/2022 revealing a total cholesterol 118, LDL 57 and HDL 41.

## 2022-05-31 NOTE — Progress Notes (Signed)
05/31/2022 [name]   1940/08/25  [id number]  Primary Physician Irena Reichmann, DO Primary Cardiologist: Runell Gess MD Nicholes Calam[place], [place]  HPI:  [name] is a 82 y.o.   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 Fabienne Bruns for cardiovascular evaluation and preoperative screening prior to elective left carotid endarterectomy.  He is accompanied by his wife David Sellers today.  I last saw him in the office 06/15/2021.Marland Kitchen His cardiovascular factor profile is positive for hypertension, dyslipidemia, and family history of heart disease with a father who died of a myocardial infarction in his 43s. He has never had a heart attack or stroke. He denies chest pain or shortness of breath.  He did have pain in his left neck and had incidentally found moderate left internal carotid artery stenosis. A Myoview stress test performed for preoperative clearance 04/23/13 was entirely normal. He underwent uncomplicated left carotid endarterectomy by Dr. Darrick Penna on 05/08/13 with excellent operative result although the patient does have some left facial numbness as result. SinceI saw him a year ago he has remained stable.   He is been under a lot of stress lately with his wife having kidney infection him having upper respiratory tract infection although he checks his blood pressure at home and it runs in the 135/70 range.     He has been diagnosed with diabetes and is seeing an endocrinologist.  He is placed on a diet is lost 15 pounds.    He has taken 3 Covid shots and developed pneumonia after these.  He has had a chronic cough for months now followed by pulmonologist.  A chest CT performed 03/28/2020 showed small bilateral pleural effusions, three-vessel coronary atherosclerosis with widespread moderate patchy regions of groundglass opacity and consolidation involving all lung lobes.  A 2D echocardiogram performed 06/24/2020 revealed severe LV  dysfunction with an EF in the 20 to 25% range.  I did refer him to Dr. Shirlee Latch  who ultimately performed right left heart cath on him 09/08/2020 revealing left main/three-vessel disease with low filling pressures.  He was evaluated for CABG and PCI and he decided only to pursue medical therapy.  He is completely asymptomatic.  Does have a right inguinal hernia which he is symptomatic from and wishes to have this surgically corrected by Dr. Gerrit Friends.  He is at high risk for this given his LV dysfunction and unrevascularized three-vessel disease.  Since I saw him in the office a year ago he was hospitalized 03/27/2022 with chest pain/non-STEMI with a troponin that went up to 375.  His EF has remained depressed at 35 to 40% range on GDMT.  He currently denies chest pain or shortness of breath.  He has complaint of bilateral calf discomfort although his ABIs are normal.     Current Meds  Medication Sig   Acetaminophen (TYLENOL ARTHRITIS PAIN PO) Take 650 mg by mouth as needed.   Alirocumab (PRALUENT) 75 MG/ML SOAJ INJECT 75 MG INTO THE SKIN EVERY 14 (FOURTEEN) DAYS. (Patient taking differently: Inject 75 mg into the skin every 14 (fourteen) days. Every other Saturday)   aspirin EC 81 MG tablet Take 81 mg by mouth every morning.   Carboxymethylcellulose Sodium (EYE DROPS OP) Apply to eye as needed (Dry eyes). Thera tears   carvedilol (COREG) 25 MG tablet Take 1 tablet (25 mg total) by mouth 2 (two) times daily with a meal.   Chlorpheniramine Maleate (CHLOR-TRIMETON  ALLERGY PO) Take by mouth as needed (Allergies).   Cholecalciferol (VITAMIN D-3) 125 MCG (5000 UT) TABS Take 5,000 Units by mouth daily.   clopidogrel (PLAVIX) 75 MG tablet TAKE 1 TABLET BY MOUTH EVERY DAY (Patient taking differently: Take 75 mg by mouth daily.)   doxazosin (CARDURA) 4 MG tablet Take 4 mg by mouth daily.   isosorbide mononitrate (IMDUR) 60 MG 24 hr tablet Take 1 tablet (60 mg total) by mouth daily.   Krill Oil 500 MG CAPS Take 500  mg by mouth at bedtime.   metFORMIN (GLUCOPHAGE-XR) 500 MG 24 hr tablet Take 1 tablet (500 mg total) by mouth 2 (two) times daily with breakfast and lunch.   nitroGLYCERIN (NITROSTAT) 0.4 MG SL tablet Place 1 tablet (0.4 mg total) under the tongue every 5 (five) minutes x 3 doses as needed for chest pain.   ONETOUCH VERIO test strip 2 (two) times daily.   pantoprazole (PROTONIX) 40 MG tablet TAKE 1 TABLET BY MOUTH EVERY DAY   sacubitril-valsartan (ENTRESTO) 97-103 MG Take 1 tablet by mouth 2 (two) times daily.   spironolactone (ALDACTONE) 25 MG tablet TAKE 1 TABLET (25 MG TOTAL) BY MOUTH DAILY.     Allergies  Allergen Reactions   Albuterol Anaphylaxis    Per patient and daughter   Meloxicam Itching, Palpitations and Other (See Comments)    Headaches, Mood swings.   Morphine And Related Nausea And Vomiting   Statins Other (See Comments)    Myalgias. Severe arthritic response. Tolerates Lovastatin.    Cholestoff [Plant Sterols And Stanols] Other (See Comments)    Muscle aches   Nsaids Other (See Comments)    Aleve ,  Myalgia   Breo Ellipta [Fluticasone Furoate-Vilanterol]     thrush   Cortisone Hypertension    Patient states cortisone injections he has received for his knees cause hypertension and hyperglycemia   Darvon [Propoxyphene] Other (See Comments)    hallucinate   Doxycycline Cough   Emetrol     Other reaction(s): Hallucinate   Glimepiride Itching   Hydrochlorothiazide     Other reaction(s): Low Blood Pressure   Indomethacin     Neck pain flare   Jardiance [Empagliflozin]     hallucinations   Naproxen     Other reaction(s): Increased Liver Count   Penicillins     *positive allergy test*   Repatha [Evolocumab] Other (See Comments)    hyperglycemia   Sulfa Antibiotics Nausea And Vomiting   Terbinafine Diarrhea   Tramadol Other (See Comments)    Hallucinations     Social History   Socioeconomic History   Marital status: Married    Spouse name: Not on file    Number of children: Not on file   Years of education: Not on file   Highest education level: Not on file  Occupational History   Not on file  Tobacco Use   Smoking status: Never   Smokeless tobacco: Never  Vaping Use   Vaping Use: Never used  Substance and Sexual Activity   Alcohol use: No   Drug use: No   Sexual activity: Yes  Other Topics Concern   Not on file  Social History Narrative   Not on file   Social Determinants of Health   Financial Resource Strain: Not on file  Food Insecurity: No Food Insecurity (03/28/2022)   Hunger Vital Sign    Worried About Running Out of Food in the Last Year: Never true    Ran Out of Food in  the Last Year: Never true  Transportation Needs: No Transportation Needs (03/28/2022)   PRAPARE - Administrator, Civil Service (Medical): No    Lack of Transportation (Non-Medical): No  Physical Activity: Not on file  Stress: Not on file  Social Connections: Not on file  Intimate Partner Violence: Not At Risk (03/28/2022)   Humiliation, Afraid, Rape, and Kick questionnaire    Fear of Current or Ex-Partner: No    Emotionally Abused: No    Physically Abused: No    Sexually Abused: No     Review of Systems: General: negative for chills, fever, night sweats or weight changes.  Cardiovascular: negative for chest pain, dyspnea on exertion, edema, orthopnea, palpitations, paroxysmal nocturnal dyspnea or shortness of breath Dermatological: negative for rash Respiratory: negative for cough or wheezing Urologic: negative for hematuria Abdominal: negative for nausea, vomiting, diarrhea, bright red blood per rectum, melena, or hematemesis Neurologic: negative for visual changes, syncope, or dizziness All other systems reviewed and are otherwise negative except as noted above.    Blood pressure (!) 184/92, pulse (!) 59, height 5\' 6"  (1.676 m), weight 145 lb 6.4 oz (66 kg), SpO2 99 %.  General appearance: alert and no distress Neck: no  adenopathy, no carotid bruit, no JVD, supple, symmetrical, trachea midline, and thyroid not enlarged, symmetric, no tenderness/mass/nodules Lungs: clear to auscultation bilaterally Heart: regular rate and rhythm, S1, S2 normal, no murmur, click, rub or gallop Extremities: extremities normal, atraumatic, no cyanosis or edema Pulses: 2+ and symmetric Skin: Skin color, texture, turgor normal. No rashes or lesions Neurologic: Grossly normal  EKG not performed today  ASSESSMENT AND PLAN:   Occlusion and stenosis of carotid artery without mention of cerebral infarction History of carotid artery disease status post uncomplicated left carotid endarterectomy by Dr. Steva Ready with excellent result.  We have been following him by duplex ultrasounds which have remained widely patent.  Essential hypertension History of essential hypertension with a whitecoat component.  His blood pressure today is 184/92 although his average blood pressure at home is 126/71.  He is on carvedilol, spironolactone and Entresto.  Hyperlipidemia History of hyperlipidemia on Praluent with lipid profile performed 03/28/2022 revealing a total cholesterol 118, LDL 57 and HDL 41.  HFrEF (heart failure with reduced ejection fraction) (HCC) History of ischemic cardiomyopathy with an EF by recent 2D echo performed 03/28/2022 of 35 to 40% which has remained stable on GDMT.  He denies symptoms of heart failure.  Coronary artery disease History of CAD status post cardiac catheterization performed by Dr. Shirlee Latch 09/08/2020 revealing left main/three-vessel disease.  He was turned down for CABG by the CTS and did not wish to pursue percutaneous intervention but rather medical therapy.  He was recently admitted with a non-STEMI 03/27/2022.  His troponins went up to 375.  He was treated with heparin for 48 hours.  He has had no further symptoms.     Runell Gess MD FACP,FACC,FAHA, [facility] 05/31/2022 11:12 AM

## 2022-05-31 NOTE — Assessment & Plan Note (Signed)
History of essential hypertension with a whitecoat component.  His blood pressure today is 184/92 although his average blood pressure at home is 126/71.  He is on carvedilol, spironolactone and Entresto.

## 2022-05-31 NOTE — Patient Instructions (Signed)
Medication Instructions:  Your physician recommends that you continue on your current medications as directed. Please refer to the Current Medication list given to you today.  *If you need a refill on your cardiac medications before your next appointment, please call your pharmacy*  Testing/Procedures: Your physician has requested that you have a carotid duplex. This test is an ultrasound of the carotid arteries in your neck. It looks at blood flow through these arteries that supply the brain with blood. Allow one hour for this exam. There are no restrictions or special instructions. This will take place at [address] [facility], [address]. To do in October.   Follow-Up: At [facility], you and your health needs are our priority.  As part of our continuing mission to provide you with exceptional heart care, we have created designated Provider Care Teams.  These Care Teams include your primary Cardiologist (physician) and Advanced Practice Providers (APPs -  Physician Assistants and Nurse Practitioners) who all work together to provide you with the care you need, when you need it.  We recommend signing up for the patient portal called "MyChart".  Sign up information is provided on this After Visit Summary.  MyChart is used to connect with patients for Virtual Visits (Telemedicine).  Patients are able to view lab/test results, encounter notes, upcoming appointments, etc.  Non-urgent messages can be sent to your provider as well.   To learn more about what you can do with MyChart, go to [email or web address].    Your next appointment:   6 month(s)  Provider:   Micah Flesher, [name], Marjie Skiff, [name], Juanda Crumble, [name], Joni Reining, DNP, ANP, Azalee Course, [name], Bernadene Person, NP, or Carlos Levering, NP      Then, Nanetta Batty, MD will plan to see you again in 12 month(s).

## 2022-05-31 NOTE — Assessment & Plan Note (Signed)
History of carotid artery disease status post uncomplicated left carotid endarterectomy by Dr. Steva Ready with excellent result.  We have been following him by duplex ultrasounds which have remained widely patent.

## 2022-05-31 NOTE — Assessment & Plan Note (Signed)
History of ischemic cardiomyopathy with an EF by recent 2D echo performed 03/28/2022 of 35 to 40% which has remained stable on GDMT.  He denies symptoms of heart failure.

## 2022-05-31 NOTE — Assessment & Plan Note (Signed)
History of CAD status post cardiac catheterization performed by Dr. Shirlee Latch 09/08/2020 revealing left main/three-vessel disease.  He was turned down for CABG by the CTS and did not wish to pursue percutaneous intervention but rather medical therapy.  He was recently admitted with a non-STEMI 03/27/2022.  His troponins went up to 375.  He was treated with heparin for 48 hours.  He has had no further symptoms.

## 2022-06-15 ENCOUNTER — Telehealth: Payer: Self-pay | Admitting: Cardiovascular Disease

## 2022-06-15 MED ORDER — DOXAZOSIN MESYLATE 8 MG PO TABS: 8.0000 mg | ORAL_TABLET | Freq: Every day | ORAL | 3 refills | Status: DC

## 2022-06-15 NOTE — Telephone Encounter (Signed)
Runell Gess, MD  You4 minutes ago (5:16 PM)    If that's what he was [facility] on you can update med list and fill script   Called pt and told that we will update dosage and prescription sent to his pharmacy. He verbalized understanding.

## 2022-06-15 NOTE — Telephone Encounter (Signed)
Pt c/o medication issue:  1. Name of Medication: doxazosin (CARDURA) 4 MG tablet   2. How are you currently taking this medication (dosage and times per day)?    3. Are you having a reaction (difficulty breathing--STAT)? no  4. What is your medication issue? Patient states medication should be 8 mg instead of 4mg . Stated that it was change when he went into the hospital. Please advise

## 2022-06-18 ENCOUNTER — Other Ambulatory Visit: Payer: Self-pay | Admitting: Cardiovascular Disease

## 2022-06-22 ENCOUNTER — Other Ambulatory Visit (HOSPITAL_COMMUNITY): Payer: Self-pay | Admitting: Cardiology

## 2022-06-23 DIAGNOSIS — J309 Allergic rhinitis, unspecified: Secondary | ICD-10-CM | POA: Diagnosis not present

## 2022-06-23 DIAGNOSIS — R0981 Nasal congestion: Secondary | ICD-10-CM | POA: Diagnosis not present

## 2022-06-23 DIAGNOSIS — R051 Acute cough: Secondary | ICD-10-CM | POA: Diagnosis not present

## 2022-07-28 ENCOUNTER — Other Ambulatory Visit: Payer: Self-pay | Admitting: Pharmacist Clinician (PhC)/ Clinical Pharmacy Specialist

## 2022-07-28 MED ORDER — CARVEDILOL 25 MG PO TABS: 25.0000 mg | ORAL_TABLET | Freq: Two times a day (BID) | ORAL | 3 refills | Status: DC

## 2022-08-08 DIAGNOSIS — H1789 Other corneal scars and opacities: Secondary | ICD-10-CM | POA: Diagnosis not present

## 2022-08-08 DIAGNOSIS — H25011 Cortical age-related cataract, right eye: Secondary | ICD-10-CM | POA: Diagnosis not present

## 2022-08-08 DIAGNOSIS — E119 Type 2 diabetes mellitus without complications: Secondary | ICD-10-CM | POA: Diagnosis not present

## 2022-08-08 DIAGNOSIS — H2511 Age-related nuclear cataract, right eye: Secondary | ICD-10-CM | POA: Diagnosis not present

## 2022-08-08 DIAGNOSIS — H5211 Myopia, right eye: Secondary | ICD-10-CM | POA: Diagnosis not present

## 2022-09-03 ENCOUNTER — Other Ambulatory Visit: Payer: Self-pay | Admitting: Cardiology

## 2022-09-13 DIAGNOSIS — E559 Vitamin D deficiency, unspecified: Secondary | ICD-10-CM | POA: Diagnosis not present

## 2022-09-13 DIAGNOSIS — E1159 Type 2 diabetes mellitus with other circulatory complications: Secondary | ICD-10-CM | POA: Diagnosis not present

## 2022-09-13 DIAGNOSIS — Z79899 Other long term (current) drug therapy: Secondary | ICD-10-CM | POA: Diagnosis not present

## 2022-09-13 DIAGNOSIS — I5022 Chronic systolic (congestive) heart failure: Secondary | ICD-10-CM | POA: Diagnosis not present

## 2022-09-20 DIAGNOSIS — E559 Vitamin D deficiency, unspecified: Secondary | ICD-10-CM | POA: Diagnosis not present

## 2022-09-20 DIAGNOSIS — I429 Cardiomyopathy, unspecified: Secondary | ICD-10-CM | POA: Diagnosis not present

## 2022-09-20 DIAGNOSIS — Z79899 Other long term (current) drug therapy: Secondary | ICD-10-CM | POA: Diagnosis not present

## 2022-09-20 DIAGNOSIS — I5022 Chronic systolic (congestive) heart failure: Secondary | ICD-10-CM | POA: Diagnosis not present

## 2022-09-20 DIAGNOSIS — E538 Deficiency of other specified B group vitamins: Secondary | ICD-10-CM | POA: Diagnosis not present

## 2022-09-20 DIAGNOSIS — E1159 Type 2 diabetes mellitus with other circulatory complications: Secondary | ICD-10-CM | POA: Diagnosis not present

## 2022-09-20 DIAGNOSIS — R946 Abnormal results of thyroid function studies: Secondary | ICD-10-CM | POA: Diagnosis not present

## 2022-09-20 DIAGNOSIS — E785 Hyperlipidemia, unspecified: Secondary | ICD-10-CM | POA: Diagnosis not present

## 2022-10-17 ENCOUNTER — Encounter: Payer: Self-pay | Admitting: Cardiovascular Disease

## 2022-10-19 ENCOUNTER — Other Ambulatory Visit: Payer: Self-pay | Admitting: *Deleted

## 2022-10-19 ENCOUNTER — Other Ambulatory Visit (HOSPITAL_COMMUNITY): Payer: Self-pay | Admitting: Cardiology

## 2022-10-19 MED ORDER — NITROGLYCERIN 0.4 MG SL SUBL: 0.4000 mg | SUBLINGUAL_TABLET | SUBLINGUAL | 2 refills | Status: DC | PRN

## 2022-10-21 ENCOUNTER — Other Ambulatory Visit: Payer: Self-pay

## 2022-10-21 MED ORDER — NITROGLYCERIN 0.4 MG SL SUBL: 0.4000 mg | SUBLINGUAL_TABLET | SUBLINGUAL | 2 refills | Status: DC | PRN

## 2022-11-01 ENCOUNTER — Other Ambulatory Visit: Payer: Self-pay | Admitting: Cardiovascular Disease

## 2022-11-15 ENCOUNTER — Other Ambulatory Visit: Payer: Self-pay | Admitting: Cardiovascular Disease

## 2022-11-15 DIAGNOSIS — I2510 Atherosclerotic heart disease of native coronary artery without angina pectoris: Secondary | ICD-10-CM

## 2022-11-15 DIAGNOSIS — I6523 Occlusion and stenosis of bilateral carotid arteries: Secondary | ICD-10-CM

## 2022-11-15 DIAGNOSIS — I10 Essential (primary) hypertension: Secondary | ICD-10-CM

## 2022-11-15 DIAGNOSIS — I5020 Unspecified systolic (congestive) heart failure: Secondary | ICD-10-CM

## 2022-11-15 DIAGNOSIS — Z9889 Other specified postprocedural states: Secondary | ICD-10-CM

## 2022-11-23 ENCOUNTER — Ambulatory Visit (HOSPITAL_COMMUNITY)
Admission: RE | Admit: 2022-11-23 | Discharge: 2022-11-23 | Disposition: A | Discharge status: Home / Self Care | Payer: PPO | Source: Ambulatory Visit | Attending: Cardiovascular Disease | Admitting: Cardiovascular Disease

## 2022-11-23 DIAGNOSIS — I6523 Occlusion and stenosis of bilateral carotid arteries: Secondary | ICD-10-CM | POA: Diagnosis not present

## 2022-11-23 DIAGNOSIS — Z9889 Other specified postprocedural states: Secondary | ICD-10-CM | POA: Diagnosis not present

## 2022-11-28 ENCOUNTER — Encounter: Payer: Self-pay | Admitting: Nurse Practitioner

## 2022-11-28 ENCOUNTER — Ambulatory Visit: Payer: PPO | Attending: Nurse Practitioner | Admitting: Nurse Practitioner

## 2022-11-28 VITALS — BP 122/80 | HR 55 | Ht 66.0 in | Wt 147.0 lb

## 2022-11-28 DIAGNOSIS — I2510 Atherosclerotic heart disease of native coronary artery without angina pectoris: Secondary | ICD-10-CM | POA: Diagnosis not present

## 2022-11-28 DIAGNOSIS — I255 Ischemic cardiomyopathy: Secondary | ICD-10-CM | POA: Diagnosis not present

## 2022-11-28 DIAGNOSIS — I77810 Thoracic aortic ectasia: Secondary | ICD-10-CM | POA: Diagnosis not present

## 2022-11-28 DIAGNOSIS — I5020 Unspecified systolic (congestive) heart failure: Secondary | ICD-10-CM | POA: Diagnosis not present

## 2022-11-28 DIAGNOSIS — I779 Disorder of arteries and arterioles, unspecified: Secondary | ICD-10-CM | POA: Diagnosis not present

## 2022-11-28 DIAGNOSIS — E785 Hyperlipidemia, unspecified: Secondary | ICD-10-CM

## 2022-11-28 DIAGNOSIS — I10 Essential (primary) hypertension: Secondary | ICD-10-CM | POA: Diagnosis not present

## 2022-11-28 DIAGNOSIS — E119 Type 2 diabetes mellitus without complications: Secondary | ICD-10-CM

## 2022-11-28 DIAGNOSIS — R002 Palpitations: Secondary | ICD-10-CM | POA: Diagnosis not present

## 2022-11-28 NOTE — Progress Notes (Unsigned)
Office Visit    Patient Name: David Sellers Date of Encounter: 11/28/2022  Primary Care Provider:  Irena Reichmann, DO Primary Cardiologist:  Nanetta Batty, MD  Chief Complaint    82 year old male with a history of CAD, chronic systolic heart failure, ICM, palpitations, carotid artery stenosis s/p left CEA, hypertension, hyperlipidemia, type 2 diabetes, and musical ear syndrome who presents for follow-up related to CAD and heart failure.   Past Medical History    Past Medical History:  Diagnosis Date   Arthritis    Asthma    only has flareup with smelly perfume    BPH (benign prostatic hyperplasia)    Carotid artery occlusion    right ICA 50% stenosis   Carotodynia    Cataract    immature to the right eye   CHF (congestive heart failure) (HCC)    Coronary artery disease    Diabetes mellitus without complication (HCC)    borderline   GERD (gastroesophageal reflux disease)    takes Omeprazole daily   Headache(784.0)    r/t arthritis in neck    Hemorrhoids    History of bronchitis    last time 10-65yrs ago   History of colon polyps    Hyperlipidemia    no meds since Jan 1   Hypertension    takes Verapamil,Lisinopril,and Cardura daily   Joint pain    Legally blind in left eye, as defined in [place]    hit by a baseball   Low back pain    reason unknown   Pneumonia    3 times in a row   Prolapsed hemorrhoids    Urinary frequency    sees Dr.Tannenbaum   Past Surgical History:  Procedure Laterality Date   CAROTID ENDARTERECTOMY Left 05/08/13   COLONOSCOPY     EGD with dilitation     ENDARTERECTOMY Left 05/08/2013   Procedure: LEFT CAROTID ARTERY ENDARTERECTOMY WITH DACRON PATCH ANGIOPLASTY;  Surgeon: Sherren Kerns, MD;  Location: [facility] OR;  Service: Vascular;  Laterality: Left;   EYE SURGERY     HEMORRHOID SURGERY N/A 08/13/2019   Procedure: OPEN HEMORRHOIDECTOMY;  Surgeon: Darnell Level, MD;  Location: [facility];  Service: General;  Laterality: N/A;  LMA    HERNIA REPAIR     x 2   INGUINAL HERNIA REPAIR Right 11/09/2020   Procedure: OPEN REPAIR RIGHT INGUINAL HERNIA;  Surgeon: Darnell Level, MD;  Location: [facility] OR;  Service: General;  Laterality: Right;   INSERTION OF MESH Right 11/09/2020   Procedure: INSERTION OF MESH;  Surgeon: Darnell Level, MD;  Location: [facility];  Service: General;  Laterality: Right;   RETINAL DETACHMENT SURGERY     retinal laser surgery Left    plate placed   RIGHT/LEFT HEART CATH AND CORONARY ANGIOGRAPHY N/A 09/08/2020   Procedure: RIGHT/LEFT HEART CATH AND CORONARY ANGIOGRAPHY;  Surgeon: Laurey Morale, MD;  Location: [facility];  Service: Cardiovascular;  Laterality: N/A;    Allergies  Allergies  Allergen Reactions   Albuterol Anaphylaxis    Per patient and daughter   Meloxicam Itching, Palpitations and Other (See Comments)    Headaches, Mood swings.   Morphine And Codeine Nausea And Vomiting   Statins Other (See Comments)    Myalgias. Severe arthritic response. Tolerates Lovastatin.    Cholestoff [Plant Sterols And Stanols] Other (See Comments)    Muscle aches   Nsaids Other (See Comments)    Aleve ,  Myalgia   Breo Ellipta [Fluticasone Furoate-Vilanterol]  thrush   Cortisone Hypertension    Patient states cortisone injections he has received for his knees cause hypertension and hyperglycemia   Darvon [Propoxyphene] Other (See Comments)    hallucinate   Doxycycline Cough   Emetrol     Other reaction(s): Hallucinate   Glimepiride Itching   Hydrochlorothiazide     Other reaction(s): Low Blood Pressure   Indomethacin     Neck pain flare   Jardiance [Empagliflozin]     hallucinations   Naproxen     Other reaction(s): Increased Liver Count   Penicillins     *positive allergy test*   Repatha [Evolocumab] Other (See Comments)    hyperglycemia   Sulfa Antibiotics Nausea And Vomiting   Terbinafine Diarrhea   Tramadol Other (See Comments)    Hallucinations      Labs/Other Studies  Reviewed    The following studies were reviewed today:  Cardiac Studies & Procedures   CARDIAC CATHETERIZATION  CARDIAC CATHETERIZATION 09/08/2020  Narrative   Prox RCA lesion is 80% stenosed.   Mid RCA lesion is 95% stenosed.   Ost Cx lesion is 100% stenosed.   Ost LM lesion is 80% stenosed.   Mid LAD lesion is 60% stenosed.   1st Diag lesion is 95% stenosed.   Dist LAD lesion is 70% stenosed.  1. Low filling pressures. 2. Preserved cardiac output. 3. Severe coronary disease with critical mid RCA stenosis, occluded ostial LCx with collaterals from PDA, and 80% left main stenosis with moderate LAD disease.  I will admit the patient for consultation for CABG.  This is complicated by ischemic cardiomyopathy, though he is well-compensated.  Findings Coronary Findings Diagnostic  Dominance: Right  Left Main Ost LM lesion is 80% stenosed.  Left Anterior Descending Mid LAD lesion is 60% stenosed. Dist LAD lesion is 70% stenosed.  First Diagonal Branch 1st Diag lesion is 95% stenosed.  Left Circumflex Collaterals Dist Cx filled by collaterals from RPDA.  Ost Cx lesion is 100% stenosed.  Right Coronary Artery Prox RCA lesion is 80% stenosed. Mid RCA lesion is 95% stenosed.  Intervention  No interventions have been documented.   STRESS TESTS  MYOCARDIAL PERFUSION IMAGING 07/10/2020  Narrative  Nuclear stress EF: 25%.  The left ventricular ejection fraction is severely decreased (<30%).  There was no ST segment deviation noted during stress.  No T wave inversion was noted during stress.  Findings consistent with prior myocardial infarction.  This is a high risk study.  1. There is a large (~15-20% of LV), moderate to severe, predominantly fixed perfusion defect present in the basal to mid inferior/inferolateral segments. There is mild reversibility of the inferior segments. These segments are akinetic, so this favors prior infarction with mild peri-infarct  ischemia. 2. Severely reduced LVEF, 25%. 3. Akinesis of the basal to mid inferior/inferolateral segments. Global hypokinesis in other segments. 4. This is a high risk study.   ECHOCARDIOGRAM  ECHOCARDIOGRAM COMPLETE 03/28/2022  Narrative ECHOCARDIOGRAM REPORT    Patient Name:   David Sellers Date of Exam: 03/28/2022 Medical Rec #:  [id number]     Height:       66.0 in Accession #:    [id number]    Weight:       145.8 lb Date of Birth:  Apr 25, 1940     BSA:          1.748 m Patient Age:    81 years      BP:  128/70 mmHg Patient Gender: M             HR:           82 bpm. Exam Location:  Inpatient  Procedure: 2D Echo, Cardiac Doppler, Color Doppler and 3D Echo  Indications:    Acute ischemic heart disease, unspecified I24.9  History:        Patient has prior history of Echocardiogram examinations, most recent 06/03/2021. CHF, CAD, Carotid Disease and PAD; Risk Factors:Non-Smoker, Hypertension, Diabetes and Dyslipidemia.  Sonographer:    Dondra Prader RVT RCS Referring Phys: [phone] PETER M [place]  IMPRESSIONS   1. Left ventricular ejection fraction, by estimation, is 35 to 40%. The left ventricle has moderately decreased function. The left ventricle demonstrates regional wall motion abnormalities (see scoring diagram/findings for description). There is mild concentric left ventricular hypertrophy. Left ventricular diastolic parameters are consistent with Grade I diastolic dysfunction (impaired relaxation). There is akinesis of the left ventricular, basal-mid lateral wall and inferolateral wall. 2. Right ventricular systolic function is normal. The right ventricular size is normal. 3. Left atrial size was mildly dilated. 4. The mitral valve is normal in structure. Trivial mitral valve regurgitation. No evidence of mitral stenosis. 5. The aortic valve is tricuspid. There is mild calcification of the aortic valve. Aortic valve regurgitation is not visualized. No aortic stenosis is  present. 6. Aortic dilatation noted. There is moderate dilatation of the aortic root, measuring 44 mm. 7. The inferior vena cava is normal in size with greater than 50% respiratory variability, suggesting right atrial pressure of 3 mmHg.  FINDINGS Left Ventricle: Left ventricular ejection fraction, by estimation, is 35 to 40%. The left ventricle has moderately decreased function. The left ventricle demonstrates regional wall motion abnormalities. The left ventricular internal cavity size was normal in size. There is mild concentric left ventricular hypertrophy. Left ventricular diastolic parameters are consistent with Grade I diastolic dysfunction (impaired relaxation).  Right Ventricle: The right ventricular size is normal. No increase in right ventricular wall thickness. Right ventricular systolic function is normal.  Left Atrium: Left atrial size was mildly dilated.  Right Atrium: Right atrial size was not well visualized.  Pericardium: There is no evidence of pericardial effusion.  Mitral Valve: The mitral valve is normal in structure. Trivial mitral valve regurgitation. No evidence of mitral valve stenosis.  Tricuspid Valve: The tricuspid valve is normal in structure. Tricuspid valve regurgitation is mild . No evidence of tricuspid stenosis.  Aortic Valve: The aortic valve is tricuspid. There is mild calcification of the aortic valve. Aortic valve regurgitation is not visualized. No aortic stenosis is present. Aortic valve mean gradient measures 2.0 mmHg. Aortic valve peak gradient measures 3.0 mmHg. Aortic valve area, by VTI measures 3.04 cm.  Pulmonic Valve: The pulmonic valve was normal in structure. Pulmonic valve regurgitation is not visualized. No evidence of pulmonic stenosis.  Aorta: Aortic dilatation noted. There is moderate dilatation of the aortic root, measuring 44 mm.  Venous: The inferior vena cava is normal in size with greater than 50% respiratory variability,  suggesting right atrial pressure of 3 mmHg.  IAS/Shunts: No atrial level shunt detected by color flow Doppler.   LEFT VENTRICLE PLAX 2D LVIDd:         5.20 cm LVIDs:         4.10 cm LV PW:         1.30 cm LV IVS:        1.30 cm LVOT diam:     2.10 cm  3D Volume EF: LV SV:         52           3D EF:        37 % LV SV Index:   30           LV EDV:       187 ml LVOT Area:     3.46 cm     LV ESV:       118 ml LV SV:        69 ml  LV Volumes (MOD) LV vol d, MOD A2C: 131.0 ml LV vol d, MOD A4C: 137.0 ml LV vol s, MOD A2C: 59.5 ml LV vol s, MOD A4C: 78.9 ml LV SV MOD A2C:     71.5 ml LV SV MOD A4C:     137.0 ml LV SV MOD BP:      65.1 ml  RIGHT VENTRICLE             IVC RV Basal diam:  3.60 cm     IVC diam: 1.70 cm RV S prime:     15.90 cm/s TAPSE (M-mode): 2.3 cm  LEFT ATRIUM             Index        RIGHT ATRIUM           Index LA diam:        4.40 cm 2.52 cm/m   RA Area:     13.90 cm LA Vol (A2C):   81.6 ml 46.67 ml/m  RA Volume:   32.10 ml  18.36 ml/m LA Vol (A4C):   51.1 ml 29.23 ml/m LA Biplane Vol: 65.0 ml 37.18 ml/m AORTIC VALVE                    PULMONIC VALVE AV Area (Vmax):    3.01 cm     PV Vmax:       0.75 m/s AV Area (Vmean):   2.89 cm     PV Peak grad:  2.3 mmHg AV Area (VTI):     3.04 cm AV Vmax:           86.05 cm/s AV Vmean:          65.450 cm/s AV VTI:            0.171 m AV Peak Grad:      3.0 mmHg AV Mean Grad:      2.0 mmHg LVOT Vmax:         74.87 cm/s LVOT Vmean:        54.667 cm/s LVOT VTI:          0.150 m LVOT/AV VTI ratio: 0.88  AORTA Ao Root diam: 4.40 cm Ao Arch diam: 3.3 cm  MITRAL VALVE                TRICUSPID VALVE MV Area (PHT): 7.70 cm     TR Peak grad:   56.0 mmHg MV Decel Time: 99 msec      TR Vmax:        374.00 cm/s MV E velocity: 51.77 cm/s MV A velocity: 105.50 cm/s  SHUNTS MV E/A ratio:  0.49         Systemic VTI:  0.15 m Systemic Diam: 2.10 cm  Arvilla Meres MD Electronically signed by Arvilla Meres MD Signature Date/Time: 03/28/2022/4:41:27 PM    Final      CARDIAC MRI  MR CARDIAC MORPHOLOGY  W WO CONTRAST 09/09/2020  Narrative CLINICAL DATA:  Ischemic cardiomyopathy, assess viability.  EXAM: CARDIAC MRI  TECHNIQUE: The patient was scanned on a 1.5 Tesla GE magnet. A dedicated cardiac coil was used. Functional imaging was done using Fiesta sequences. 2,3, and 4 chamber views were done to assess for RWMA's. Modified Simpson's rule using a short axis stack was used to calculate an ejection fraction on a dedicated work [employer]. The patient received 8 cc of Gadavist. After 10 minutes inversion recovery sequences were used to assess for infiltration and scar tissue.  FINDINGS: Normal left ventricular size and normal wall thickness. Basal to mid inferolateral akinesis, inferior hypokinesis, mid anterolateral hypokinesis. Images not optimal for EF quantification, estimated LV EF 25-30%. Normal right ventricular size and systolic function. Normal left and right atrial size. No significant mitral regurgitation or tricuspid regurgitation. Trileaflet aortic valve, no significant regurgitation or stenosis.  Delayed enhancement imaging:  25 - 49% wall thickness subendocardial late gadolinium enhancement in the basal to mid inferolateral wall.  IMPRESSION: 1. Normal left ventricular size with wall motion abnormalities as noted above. LV EF 25-30% (images not ideal for quantification by Simpson's rule).  2.  Normal RV size and systolic function.  3. There is significant myocardial viability. There is non-transmural scar only in the basal to mid inferolateral wall. May see significant LV functional improvement with full revascularization.  Dalton Mclean   Electronically Signed By: Marca Ancona M.D. On: 09/09/2020 14:20        Recent Labs: 03/27/2022: ALT 15; B Natriuretic Peptide 1,638.1 03/30/2022: BUN 12; Creatinine, Ser 1.06;  Hemoglobin 10.1; Magnesium 2.2; Platelets 158; Potassium 3.6; Sodium 137  Recent Lipid Panel    Component Value Date/Time   CHOL 118 03/28/2022 0350   CHOL 124 12/28/2020 1007   TRIG 101 03/28/2022 0350   HDL 41 03/28/2022 0350   HDL 49 12/28/2020 1007   CHOLHDL 2.9 03/28/2022 0350   VLDL 20 03/28/2022 0350   LDLCALC 57 03/28/2022 0350   LDLCALC 44 12/28/2020 1007    History of Present Illness    82 year old male with the above past medical history including CAD, chronic systolic heart failure, ICM, palpitations, carotid artery stenosis s/p left CEA, hypertension, hyperlipidemia, type 2 diabetes, and musical ear syndrome (he hears a [employer]).   Prior Myoview in 2015 in the setting of preop clearance was low risk.  Cardiac catheterization in 09/2020 revealed high-grade tear of the proximal and mid RCA stenoses, occlusion of left circumflex artery, 80% ostial left main with moderate segmental proximal LAD disease.  He was deemed a poor candidate for CABG, high risk PCI candidate. Cardiac MRI at the time did show viability, medical therapy was recommended.  He is intolerant to statins and is on Praluent.  He has a history of ICM, chronic systolic heart failure, intolerant to Jardiance/Farxiga.  Echocardiogram in 05/2021 showed EF 35 to 40%, moderate increase in pulmonary pressures.  Prior sleep study was negative for OSA.  He was hospitalized in February 2024 in the setting of NSTEMI.  He was treated with heparin for 48 hours, he denied any further symptoms. Echocardiogram at the time showed EF 35 to 40%, moderately decreased LV function, no RWMA, mild concentric LVH, G1 DD, normal RV, moderate dilation of the aortic root measuring 44 mm.  ABIs in 03/2022 were normal.  He was last in the office on 05/31/2022 and was stable from a cardiac standpoint.  He denied symptoms concerning for angina.  Carotid Dopplers in 11/2022 showed 1 to 39% R ICA stenosis, 1 to 39% LICA stenosis, patent endarterectomy  site, right subclavian artery stenosis.  Follow-up study was recommended in 12 months.   He presents today for follow-up accompanied by his wife.  Since his last visit he has been stable from a cardiac standpoint.  He denies symptoms concerning for angina, denies palpitations, dyspnea, edema, PND, orthopnea, weight gain.  Overall, he reports feeling well.  Home Medications    Current Outpatient Medications  Medication Sig Dispense Refill   Acetaminophen (TYLENOL ARTHRITIS PAIN PO) Take 650 mg by mouth as needed.     Alirocumab (PRALUENT) 75 MG/ML SOAJ INJECT SUBCUTANEOUSLY EVERY 14 DAYS 6 mL 3   aspirin EC 81 MG tablet Take 81 mg by mouth every morning.     Carboxymethylcellulose Sodium (EYE DROPS OP) Apply to eye as needed (Dry eyes). Thera tears     carvedilol (COREG) 25 MG tablet Take 1 tablet (25 mg total) by mouth 2 (two) times daily with a meal. 180 tablet 3   cetirizine (ZYRTEC) 10 MG tablet Take 10 mg by mouth 2 (two) times daily.     Chlorpheniramine Maleate (CHLOR-TRIMETON ALLERGY PO) Take by mouth as needed (Allergies).     Cholecalciferol (VITAMIN D-3) 125 MCG (5000 UT) TABS Take 5,000 Units by mouth daily.     clopidogrel (PLAVIX) 75 MG tablet TAKE 1 TABLET BY MOUTH EVERY DAY 90 tablet 3   doxazosin (CARDURA) 8 MG tablet Take 1 tablet (8 mg total) by mouth daily. 90 tablet 3   isosorbide mononitrate (IMDUR) 60 MG 24 hr tablet TAKE 1 TABLET BY MOUTH EVERY DAY 90 tablet 0   Krill Oil 500 MG CAPS Take 500 mg by mouth at bedtime.     metFORMIN (GLUCOPHAGE-XR) 500 MG 24 hr tablet Take 1 tablet (500 mg total) by mouth 2 (two) times daily with breakfast and lunch.     nitroGLYCERIN (NITROSTAT) 0.4 MG SL tablet Place 1 tablet (0.4 mg total) under the tongue every 5 (five) minutes x 3 doses as needed for chest pain. 75 tablet 2   ONETOUCH VERIO test strip 2 (two) times daily.     pantoprazole (PROTONIX) 40 MG tablet TAKE 1 TABLET BY MOUTH EVERY DAY 90 tablet 3   sacubitril-valsartan  (ENTRESTO) 97-103 MG Take 1 tablet by mouth 2 (two) times daily. 180 tablet 3   spironolactone (ALDACTONE) 25 MG tablet TAKE 1 TABLET (25 MG TOTAL) BY MOUTH DAILY. 90 tablet 0   No current facility-administered medications for this visit.     Review of Systems    He denies chest pain, palpitations, dyspnea, pnd, orthopnea, n, v, dizziness, syncope, edema, weight gain, or early satiety. All other systems reviewed and are otherwise negative except as noted above.   Physical Exam    VS:  BP 122/80   Pulse (!) 55   Ht 5\' 6"  (1.676 m)   Wt 147 lb (66.7 kg)   SpO2 99%   BMI 23.73 kg/m   GEN: Well nourished, well developed, in no acute distress. HEENT: normal. Neck: Supple, no JVD, carotid bruits, or masses. Cardiac: RRR, no murmurs, rubs, or gallops. No clubbing, cyanosis, edema.  Radials/DP/PT 2+ and equal bilaterally.  Respiratory:  Respirations regular and unlabored, clear to auscultation bilaterally. GI: Soft, nontender, nondistended, BS + x 4. MS: no deformity or atrophy. Skin: warm and dry, no rash. Neuro:  Strength and sensation are intact. Psych: Normal affect.  Accessory Clinical  Findings    ECG personally reviewed by me today -    - no EKG in office today.    Lab Results  Component Value Date   WBC 3.9 (L) 03/30/2022   HGB 10.1 (L) 03/30/2022   HCT 29.2 (L) 03/30/2022   MCV 96.1 03/30/2022   PLT 158 03/30/2022   Lab Results  Component Value Date   CREATININE 1.06 03/30/2022   BUN 12 03/30/2022   NA 137 03/30/2022   K 3.6 03/30/2022   CL 106 03/30/2022   CO2 23 03/30/2022   Lab Results  Component Value Date   ALT 15 03/27/2022   AST 21 03/27/2022   ALKPHOS 32 (L) 03/27/2022   BILITOT 0.9 03/27/2022   Lab Results  Component Value Date   CHOL 118 03/28/2022   HDL 41 03/28/2022   LDLCALC 57 03/28/2022   TRIG 101 03/28/2022   CHOLHDL 2.9 03/28/2022    No results found for: "HGBA1C"  Assessment & Plan    1. CAD: Cath in 09/2020 revealed high-grade  tear of the proximal and mid RCA stenoses, occlusion of left circumflex artery, 80% ostial left main with moderate segmental proximal LAD disease. Poor candidate for CABG, high risk PCI candidate.  Cardiac MRI at the time did show viability, however, medical therapy was recommended. S/p NSTEMI in 03/2022, continued medical therapy.  Stable with no anginal symptoms.  Continue aspirin, Plavix, carvedilol, spironolactone, Entresto, doxazosin, Imdur, and Praluent.   2. Chronic systolic heart failure/ICM: Echo in 03/2022 showed EF 35 to 40%, moderately decreased LV function, no RWMA, mild concentric LVH, G1 DD, normal RV, moderate dilation of the aortic root measuring 44 mm.  Euvolemic and well compensated on exam.  Of note, he is intolerant to SGLT2 inhibitors.  Continue current medications as above.   3. Aortic root dilation: Noted on most recent echo, measuring 44 mm.  Consider CT chest/aorta versus repeat echo in 03/2022.    4. Palpitations: Previously declined monitor.  Denies any recent palpitations.  Stable.   5. Carotid artery disease: Carotid Dopplers in 11/2022 showed 1 to 39% R ICA stenosis, 1 to 39% LICA stenosis, patent endarterectomy site, right subclavian artery stenosis. Repeat study recommended in 1 year.    6. Hypertension: BP well controlled. Continue current antihypertensive regimen.    7. Hyperlipidemia: LDL was 57 in 03/2022. Monitored and managed per PCP.  Continue Praulent.    8. Type 2 diabetes: A1c was 6.5 in 09/2022. Monitored and managed per PCP.    9. Disposition: Follow-up in 6 months with Dr. Allyson Sabal.       Joylene Grapes, NP 11/28/2022, 10:36 AM

## 2022-11-28 NOTE — Patient Instructions (Signed)
Medication Instructions:  Your physician recommends that you continue on your current medications as directed. Please refer to the Current Medication list given to you today.  *If you need a refill on your cardiac medications before your next appointment, please call your pharmacy*   Lab Work: NONE ordered at this time of appointment    Testing/Procedures: NONE ordered at this time of appointment     Follow-Up: At [facility], you and your health needs are our priority.  As part of our continuing mission to provide you with exceptional heart care, we have created designated Provider Care Teams.  These Care Teams include your primary Cardiologist (physician) and Advanced Practice Providers (APPs -  Physician Assistants and Nurse Practitioners) who all work together to provide you with the care you need, when you need it.  We recommend signing up for the patient portal called "MyChart".  Sign up information is provided on this After Visit Summary.  MyChart is used to connect with patients for Virtual Visits (Telemedicine).  Patients are able to view lab/test results, encounter notes, upcoming appointments, etc.  Non-urgent messages can be sent to your provider as well.   To learn more about what you can do with MyChart, go to [email or web address].    Your next appointment:   6 month(s)  Provider:   Nanetta Batty, MD

## 2022-11-29 ENCOUNTER — Encounter: Payer: Self-pay | Admitting: Nurse Practitioner

## 2022-12-03 ENCOUNTER — Other Ambulatory Visit: Payer: Self-pay | Admitting: Cardiology

## 2022-12-05 DIAGNOSIS — X32XXXD Exposure to sunlight, subsequent encounter: Secondary | ICD-10-CM | POA: Diagnosis not present

## 2022-12-05 DIAGNOSIS — L570 Actinic keratosis: Secondary | ICD-10-CM | POA: Diagnosis not present

## 2022-12-26 ENCOUNTER — Other Ambulatory Visit (HOSPITAL_BASED_OUTPATIENT_CLINIC_OR_DEPARTMENT_OTHER): Payer: Self-pay | Admitting: Cardiovascular Disease

## 2022-12-26 DIAGNOSIS — I6529 Occlusion and stenosis of unspecified carotid artery: Secondary | ICD-10-CM

## 2023-01-25 ENCOUNTER — Other Ambulatory Visit: Payer: Self-pay | Admitting: Pharmacist Clinician (PhC)/ Clinical Pharmacy Specialist

## 2023-01-25 MED ORDER — ENTRESTO 97-103 MG PO TABS: 1.0000 | ORAL_TABLET | Freq: Two times a day (BID) | ORAL | 1 refills | Status: DC

## 2023-01-25 NOTE — Telephone Encounter (Signed)
Patient has not heard back on continuation of Entresto patient assistance.  Was not allowed to refill last week when he called.  [facility] day [facility] and he is re-faxing his portion of the assistance paperwork to [employer].

## 2023-02-05 ENCOUNTER — Other Ambulatory Visit (HOSPITAL_COMMUNITY): Payer: Self-pay | Admitting: Cardiovascular Disease

## 2023-02-06 ENCOUNTER — Other Ambulatory Visit: Payer: Self-pay | Admitting: Pharmacist Clinician (PhC)/ Clinical Pharmacy Specialist

## 2023-02-06 MED ORDER — SPIRONOLACTONE 25 MG PO TABS: 25.0000 mg | ORAL_TABLET | Freq: Every day | ORAL | 3 refills | Status: DC

## 2023-02-06 NOTE — Telephone Encounter (Signed)
Renewed [employer] for another year.  ID      [id number] BIN          [id number] PCN    [id number] GRP    [id number]  Expires 01/29/2024  Also refilled spironolactone per patient request

## 2023-02-13 DIAGNOSIS — H903 Sensorineural hearing loss, bilateral: Secondary | ICD-10-CM | POA: Diagnosis not present

## 2023-03-13 DIAGNOSIS — R946 Abnormal results of thyroid function studies: Secondary | ICD-10-CM | POA: Diagnosis not present

## 2023-03-13 DIAGNOSIS — E559 Vitamin D deficiency, unspecified: Secondary | ICD-10-CM | POA: Diagnosis not present

## 2023-03-13 DIAGNOSIS — Z79899 Other long term (current) drug therapy: Secondary | ICD-10-CM | POA: Diagnosis not present

## 2023-03-13 DIAGNOSIS — E538 Deficiency of other specified B group vitamins: Secondary | ICD-10-CM | POA: Diagnosis not present

## 2023-03-13 DIAGNOSIS — E1159 Type 2 diabetes mellitus with other circulatory complications: Secondary | ICD-10-CM | POA: Diagnosis not present

## 2023-03-13 DIAGNOSIS — Z125 Encounter for screening for malignant neoplasm of prostate: Secondary | ICD-10-CM | POA: Diagnosis not present

## 2023-03-13 DIAGNOSIS — I429 Cardiomyopathy, unspecified: Secondary | ICD-10-CM | POA: Diagnosis not present

## 2023-03-13 DIAGNOSIS — E785 Hyperlipidemia, unspecified: Secondary | ICD-10-CM | POA: Diagnosis not present

## 2023-03-20 DIAGNOSIS — E1159 Type 2 diabetes mellitus with other circulatory complications: Secondary | ICD-10-CM | POA: Diagnosis not present

## 2023-03-20 DIAGNOSIS — E785 Hyperlipidemia, unspecified: Secondary | ICD-10-CM | POA: Diagnosis not present

## 2023-03-20 DIAGNOSIS — Z79899 Other long term (current) drug therapy: Secondary | ICD-10-CM | POA: Diagnosis not present

## 2023-03-20 DIAGNOSIS — E559 Vitamin D deficiency, unspecified: Secondary | ICD-10-CM | POA: Diagnosis not present

## 2023-03-20 DIAGNOSIS — I5022 Chronic systolic (congestive) heart failure: Secondary | ICD-10-CM | POA: Diagnosis not present

## 2023-03-20 DIAGNOSIS — Z9889 Other specified postprocedural states: Secondary | ICD-10-CM | POA: Diagnosis not present

## 2023-03-20 DIAGNOSIS — I429 Cardiomyopathy, unspecified: Secondary | ICD-10-CM | POA: Diagnosis not present

## 2023-03-20 DIAGNOSIS — Z0000 Encounter for general adult medical examination without abnormal findings: Secondary | ICD-10-CM | POA: Diagnosis not present

## 2023-05-29 ENCOUNTER — Ambulatory Visit: Payer: PPO | Attending: Cardiovascular Disease | Admitting: Cardiovascular Disease

## 2023-05-29 ENCOUNTER — Encounter: Payer: Self-pay | Admitting: Cardiovascular Disease

## 2023-05-29 VITALS — BP 175/82 | HR 66 | Resp 16 | Ht 66.0 in | Wt 148.4 lb

## 2023-05-29 DIAGNOSIS — R002 Palpitations: Secondary | ICD-10-CM | POA: Diagnosis not present

## 2023-05-29 DIAGNOSIS — I10 Essential (primary) hypertension: Secondary | ICD-10-CM | POA: Diagnosis not present

## 2023-05-29 DIAGNOSIS — I2510 Atherosclerotic heart disease of native coronary artery without angina pectoris: Secondary | ICD-10-CM

## 2023-05-29 DIAGNOSIS — E782 Mixed hyperlipidemia: Secondary | ICD-10-CM | POA: Diagnosis not present

## 2023-05-29 DIAGNOSIS — I5020 Unspecified systolic (congestive) heart failure: Secondary | ICD-10-CM

## 2023-05-29 NOTE — Patient Instructions (Addendum)
 Medication Instructions:  Your physician recommends that you continue on your current medications as directed. Please refer to the Current Medication list given to you today.  *If you need a refill on your cardiac medications before your next appointment, please call your pharmacy*   Testing/Procedures: Your physician has requested that you have a carotid duplex. This test is an ultrasound of the carotid arteries in your neck. It looks at blood flow through these arteries that supply the brain with blood. Allow one hour for this exam. There are no restrictions or special instructions. This will take place at [address] [facility]. 4th Floor **To do in October**  Please note: We ask at that you not bring children with you during ultrasound (echo/ vascular) testing. Due to room size and safety concerns, children are not allowed in the ultrasound rooms during exams. Our front office staff cannot provide observation of children in our lobby area while testing is being conducted. An adult accompanying a patient to their appointment will only be allowed in the ultrasound room at the discretion of the ultrasound technician under special circumstances. We apologize for any inconvenience.    Follow-Up: At [facility], you and your health needs are our priority.  As part of our continuing mission to provide you with exceptional heart care, our providers are all part of one team.  This team includes your primary Cardiologist (physician) and Advanced Practice Providers or APPs (Physician Assistants and Nurse Practitioners) who all work together to provide you with the care you need, when you need it.  Your next appointment:   6 month(s)  Provider:   Marlana Silvan, NP       Then, Lauro Portal, MD will plan to see you again in 12 month(s).    We recommend signing up for the patient portal called "MyChart".  Sign up information is provided on this After Visit Summary.  MyChart is used to connect with  patients for Virtual Visits (Telemedicine).  Patients are able to view lab/test results, encounter notes, upcoming appointments, etc.  Non-urgent messages can be sent to your provider as well.   To learn more about what you can do with MyChart, go to [email or web address].   Other Instructions       1st Floor: - Lobby - Registration  - Pharmacy  - Lab - Cafe  2nd Floor: - PV Lab - Diagnostic Testing (echo, CT, nuclear med)  3rd Floor: - Vacant  4th Floor: - TCTS (cardiothoracic surgery) - AFib Clinic - Structural Heart Clinic - Vascular Surgery  - Vascular Ultrasound  5th Floor: - Heart[facility] Cardiology (general and EP) - Clinical Pharmacy for coumadin, hypertension, lipid, weight-loss medications, and med management appointments    Valet parking services will be available as well.

## 2023-05-29 NOTE — Assessment & Plan Note (Addendum)
 History of essential hypertension with blood pressure measured today at 175/82.  He says he has "whitecoat hypertension.  He showed me a blood pressure log at home which shows his blood pressure within the normal range.  He is on carvedilol , and Entresto .

## 2023-05-29 NOTE — Progress Notes (Signed)
 05/29/2023 [name]   [birth year]  [id number]  Primary Physician Pete Brand, DO Primary Cardiologist: Avanell Leigh MD Bennye Bravo, [place]  HPI:  David Sellers is a 83 y.o.   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 Stacia Dynes for cardiovascular evaluation and preoperative screening prior to elective left carotid endarterectomy.  He is accompanied by his wife Carles Cheadle today.  I last saw him in the office 05/31/22.Aaron Aas His cardiovascular factor profile is positive for hypertension, dyslipidemia, and family history of heart disease with a father who died of a myocardial infarction in his 107s. He has never had a heart attack or stroke. He denies chest pain or shortness of breath.  He did have pain in his left neck and had incidentally found moderate left internal carotid artery stenosis. A Myoview  stress test performed for preoperative clearance 04/23/13 was entirely normal. He underwent uncomplicated left carotid endarterectomy by Dr. Nolene Baumgarten on 05/08/13 with excellent operative result although the patient does have some left facial numbness as result. SinceI saw him a year ago he has remained stable.   He is been under a lot of stress lately with his wife having kidney infection him having upper respiratory tract infection although he checks his blood pressure at home and it runs in the 135/70 range.     He has been diagnosed with diabetes and is seeing an endocrinologist.  He is placed on a diet is lost 15 pounds.    He has taken 3 Covid shots and developed pneumonia after these.  He has had a chronic cough for months now followed by pulmonologist.  A chest CT performed 03/28/2020 showed small bilateral pleural effusions, three-vessel coronary atherosclerosis with widespread moderate patchy regions of groundglass opacity and consolidation involving all lung lobes.  A 2D echocardiogram performed 06/24/2020 revealed severe LV  dysfunction with an EF in the 20 to 25% range.  I did refer him to Dr. Mitzie Anda  who ultimately performed right left heart cath on him 09/08/2020 revealing left main/three-vessel disease with low filling pressures.  He was evaluated for CABG and PCI and he decided only to pursue medical therapy.  He is completely asymptomatic.  Does have a right inguinal hernia which he is symptomatic from and wishes to have this surgically corrected by Dr. Sofia Dimaggio.  He is at high risk for this given his LV dysfunction and unrevascularized three-vessel disease.  He was hospitalized 03/27/2022 with chest pain/non-STEMI with a troponin that went up to 375.  His EF has remained depressed at 35 to 40% range on GDMT.  Since I saw him a year ago he is remained stable.  He denies chest pain or shortness of breath.  His carotid Dopplers performed 11/23/2022 revealed a widely patent endarterectomy site.  Current Meds  Medication Sig   Acetaminophen  (TYLENOL  ARTHRITIS PAIN PO) Take 650 mg by mouth as needed.   Alirocumab  (PRALUENT ) 75 MG/ML SOAJ INJECT 1ML SUBCUTANEOUSLY EVERY 14 DAYS   aspirin  EC 81 MG tablet Take 81 mg by mouth every morning.   Carboxymethylcellulose Sodium (EYE DROPS OP) Apply to eye as needed (Dry eyes). Thera tears   carvedilol  (COREG ) 25 MG tablet Take 1 tablet (25 mg total) by mouth 2 (two) times daily with a meal.   cetirizine (ZYRTEC) 10 MG tablet Take 10 mg by mouth 2 (two) times daily.   Chlorpheniramine Maleate (CHLOR-TRIMETON ALLERGY PO) Take by mouth as needed (  Allergies).   Cholecalciferol (VITAMIN D-3) 125 MCG (5000 UT) TABS Take 5,000 Units by mouth daily.   clopidogrel  (PLAVIX ) 75 MG tablet TAKE 1 TABLET BY MOUTH EVERY DAY   doxazosin  (CARDURA ) 8 MG tablet Take 1 tablet (8 mg total) by mouth daily.   isosorbide  mononitrate (IMDUR ) 60 MG 24 hr tablet TAKE 1 TABLET BY MOUTH EVERY DAY   Krill Oil 500 MG CAPS Take 500 mg by mouth at bedtime.   metFORMIN  (GLUCOPHAGE -XR) 500 MG 24 hr tablet Take 1  tablet (500 mg total) by mouth 2 (two) times daily with breakfast and lunch.   nitroGLYCERIN  (NITROSTAT ) 0.4 MG SL tablet Place 1 tablet (0.4 mg total) under the tongue every 5 (five) minutes x 3 doses as needed for chest pain.   ONETOUCH VERIO test strip 2 (two) times daily.   pantoprazole  (PROTONIX ) 40 MG tablet TAKE 1 TABLET BY MOUTH EVERY DAY   sacubitril -valsartan  (ENTRESTO ) 97-103 MG Take 1 tablet by mouth 2 (two) times daily.   spironolactone  (ALDACTONE ) 25 MG tablet Take 1 tablet (25 mg total) by mouth daily.     Allergies  Allergen Reactions   Albuterol  Anaphylaxis    Per patient and daughter   Meloxicam Itching, Palpitations and Other (See Comments)    Headaches, Mood swings.   Morphine  And Codeine Nausea And Vomiting   Statins Other (See Comments)    Myalgias. Severe arthritic response. Tolerates Lovastatin.    Cholestoff [Plant Sterols And Stanols] Other (See Comments)    Muscle aches   Nsaids Other (See Comments)    Aleve ,  Myalgia   Breo Ellipta  [Fluticasone Furoate-Vilanterol]     thrush   Cortisone Hypertension    Patient states cortisone injections he has received for his knees cause hypertension and hyperglycemia   Darvon [Propoxyphene] Other (See Comments)    hallucinate   Doxycycline Cough   Emetrol     Other reaction(s): Hallucinate   Glimepiride Itching   Hydrochlorothiazide     Other reaction(s): Low Blood Pressure   Indomethacin     Neck pain flare   Jardiance  [Empagliflozin ]     hallucinations   Naproxen     Other reaction(s): Increased Liver Count   Penicillins     *positive allergy test*   Repatha  [Evolocumab ] Other (See Comments)    hyperglycemia   Sulfa Antibiotics Nausea And Vomiting   Terbinafine Diarrhea   Tramadol  Other (See Comments)    Hallucinations     Social History   Socioeconomic History   Marital status: Married    Spouse name: Not on file   Number of children: Not on file   Years of education: Not on file   Highest  education level: Not on file  Occupational History   Not on file  Tobacco Use   Smoking status: Never   Smokeless tobacco: Never  Vaping Use   Vaping status: Never Used  Substance and Sexual Activity   Alcohol  use: No   Drug use: No   Sexual activity: Yes  Other Topics Concern   Not on file  Social History Narrative   Not on file   Social Drivers of Health   Financial Resource Strain: Not on file  Food Insecurity: No Food Insecurity (03/28/2022)   Hunger Vital Sign    Worried About Running Out of Food in the Last Year: Never true    Ran Out of Food in the Last Year: Never true  Transportation Needs: No Transportation Needs (03/28/2022)   PRAPARE -  Administrator, Civil Service (Medical): No    Lack of Transportation (Non-Medical): No  Physical Activity: Not on file  Stress: Not on file  Social Connections: Not on file  Intimate Partner Violence: Not At Risk (03/28/2022)   Humiliation, Afraid, Rape, and Kick questionnaire    Fear of Current or Ex-Partner: No    Emotionally Abused: No    Physically Abused: No    Sexually Abused: No     Review of Systems: General: negative for chills, fever, night sweats or weight changes.  Cardiovascular: negative for chest pain, dyspnea on exertion, edema, orthopnea, palpitations, paroxysmal nocturnal dyspnea or shortness of breath Dermatological: negative for rash Respiratory: negative for cough or wheezing Urologic: negative for hematuria Abdominal: negative for nausea, vomiting, diarrhea, bright red blood per rectum, melena, or hematemesis Neurologic: negative for visual changes, syncope, or dizziness All other systems reviewed and are otherwise negative except as noted above.    Blood pressure (!) 175/82, pulse 66, resp. rate 16, height 5\' 6"  (1.676 m), weight 148 lb 6.4 oz (67.3 kg), SpO2 96%.  General appearance: alert and no distress Neck: no adenopathy, no JVD, supple, symmetrical, trachea midline, thyroid  not  enlarged, symmetric, no tenderness/mass/nodules, and right carotid bruit Lungs: clear to auscultation bilaterally Heart: regular rate and rhythm, S1, S2 normal, no murmur, click, rub or gallop Extremities: extremities normal, atraumatic, no cyanosis or edema Pulses: 2+ and symmetric Skin: Skin color, texture, turgor normal. No rashes or lesions Neurologic: Grossly normal  EKG EKG Interpretation Date/Time:  Monday May 29 2023 13:23:06 EDT Ventricular Rate:  67 PR Interval:  138 QRS Duration:  112 QT Interval:  408 QTC Calculation: 431 R Axis:   -37  Text Interpretation: Normal sinus rhythm Left axis deviation Left ventricular hypertrophy with repolarization abnormality ( R in aVL , Cornell product ) When compared with ECG of 29-Mar-2022 16:22, ST no longer depressed in Lateral leads T wave inversion less evident in Lateral leads Confirmed by Lauro Portal [phone]) on 05/29/2023 1:37:04 PM    ASSESSMENT AND PLAN:   Occlusion and stenosis of carotid artery without mention of cerebral infarction History of carotid artery disease status post uncomplicated left carotid endarterectomy performed by Dr. Nolene Baumgarten 05/08/13.  His most recent carotid Dopplers performed 11/23/2022 showed no evidence of ICA stenosis.  He does have a right carotid bruit.  His Dopplers will be repeated on an annual basis.  Essential hypertension History of essential hypertension with blood pressure measured today at 175/82.  He says he has "whitecoat hypertension.  He showed me a blood pressure log at home which shows his blood pressure within the normal range.  He is on carvedilol , and Entresto .  Hyperlipidemia History of hyperlipidemia on Praluent  with lipid profile performed 03/13/2023 revealing a total cholesterol 133, LDL 66 and HDL 41.  HFrEF (heart failure with reduced ejection fraction) (HCC) History of heart failure with reduced EF.  His most recent echo performed 03/28/2022 revealed EF of 35 to 40% with segmental  wall motion abnormalities.  He also had an aortic root that measured 44 mm.  This will be repeated.     Avanell Leigh MD FACP,FACC,FAHA, [facility] 05/29/2023 1:51 PM

## 2023-05-29 NOTE — Assessment & Plan Note (Signed)
 History of hyperlipidemia on Praluent  with lipid profile performed 03/13/2023 revealing a total cholesterol 133, LDL 66 and HDL 41.

## 2023-05-29 NOTE — Assessment & Plan Note (Signed)
 History of carotid artery disease status post uncomplicated left carotid endarterectomy performed by Dr. Nolene Baumgarten 05/08/13.  His most recent carotid Dopplers performed 11/23/2022 showed no evidence of ICA stenosis.  He does have a right carotid bruit.  His Dopplers will be repeated on an annual basis.

## 2023-05-29 NOTE — Assessment & Plan Note (Signed)
 History of heart failure with reduced EF.  His most recent echo performed 03/28/2022 revealed EF of 35 to 40% with segmental wall motion abnormalities.  He also had an aortic root that measured 44 mm.  This will be repeated.

## 2023-05-31 ENCOUNTER — Other Ambulatory Visit: Payer: Self-pay | Admitting: Cardiovascular Disease

## 2023-06-09 ENCOUNTER — Other Ambulatory Visit: Payer: Self-pay | Admitting: Cardiovascular Disease

## 2023-07-10 ENCOUNTER — Encounter (HOSPITAL_BASED_OUTPATIENT_CLINIC_OR_DEPARTMENT_OTHER): Payer: Self-pay

## 2023-07-10 ENCOUNTER — Ambulatory Visit (HOSPITAL_BASED_OUTPATIENT_CLINIC_OR_DEPARTMENT_OTHER): Admission: EM | Admit: 2023-07-10 | Discharge: 2023-07-10 | Disposition: A | Discharge status: Home / Self Care

## 2023-07-10 DIAGNOSIS — M79605 Pain in left leg: Secondary | ICD-10-CM | POA: Diagnosis not present

## 2023-07-10 NOTE — ED Provider Notes (Signed)
 [facility]    CSN: [id number] Arrival date & time: 07/10/23  1325      History   Chief Complaint Chief Complaint  Patient presents with   lower leg pain   seasonal allergies    HPI David Sellers is a 83 y.o. male.   Patient is an 83 year old male presents today with left lower leg pain.  This has been ongoing for the last couple of weeks.  Waxes and wanes.  Worse sometimes with sitting and standing.  This is not affecting his ambulation.  He does report that the leg gets weak at times.  Describes pain as sharp and stabbing at times and sometimes achy.  He did have a stabbing pain in the left lower back area and at the same time had the pain in his leg in 1 instance.  Noticed the pain was worse at church after sitting in a wooden stool.  There is no swelling to the leg or changes in color or temperature.  He is used some over-the-counter thermal rub without any change. No falls or injuries.     Past Medical History:  Diagnosis Date   Arthritis    Asthma    only has flareup with smelly perfume    BPH (benign prostatic hyperplasia)    Carotid artery occlusion    right ICA 50% stenosis   Carotodynia    Cataract    immature to the right eye   CHF (congestive heart failure) (HCC)    Coronary artery disease    Diabetes mellitus without complication (HCC)    borderline   GERD (gastroesophageal reflux disease)    takes Omeprazole daily   Headache(784.0)    r/t arthritis in neck    Hemorrhoids    History of bronchitis    last time 10-50yrs ago   History of colon polyps    Hyperlipidemia    no meds since Jan 1   Hypertension    takes Verapamil ,Lisinopril ,and Cardura  daily   Joint pain    Legally blind in left eye, as defined in [place]     hit by a baseball   Low back pain    reason unknown   Pneumonia    3 times in a row   Prolapsed hemorrhoids    Urinary frequency    sees Dr.Tannenbaum    Patient Active Problem List   Diagnosis Date Noted   Type 2  diabetes mellitus with complication, without long-term current use of insulin  (HCC) 03/30/2022   NSTEMI (non-ST elevated myocardial infarction) (HCC) 03/28/2022   Coronary artery disease 06/15/2021   Right inguinal hernia 11/08/2020   Acute on chronic heart failure (HCC) 09/09/2020   CHF (congestive heart failure) (HCC) 09/08/2020   Acute on chronic systolic (congestive) heart failure (HCC) 09/08/2020   HFrEF (heart failure with reduced ejection fraction) (HCC) 08/03/2020   Prolapsed internal hemorrhoids, grade 4 08/12/2019   Carotid stenosis 12/05/2013   Carotid artery stenosis 05/08/2013   Carotid artery disease (HCC) 04/17/2013   Essential hypertension 04/17/2013   Hyperlipidemia 04/17/2013   Family history of coronary artery disease 04/17/2013   Occlusion and stenosis of carotid artery without mention of cerebral infarction 04/11/2013    Past Surgical History:  Procedure Laterality Date   CAROTID ENDARTERECTOMY Left 05/08/13   COLONOSCOPY     EGD with dilitation     ENDARTERECTOMY Left 05/08/2013   Procedure: LEFT CAROTID ARTERY ENDARTERECTOMY WITH DACRON PATCH ANGIOPLASTY;  Surgeon: Richrd Char, MD;  Location: [facility] OR;  Service: Vascular;  Laterality: Left;   EYE SURGERY     HEMORRHOID SURGERY N/A 08/13/2019   Procedure: OPEN HEMORRHOIDECTOMY;  Surgeon: Oralee Billow, MD;  Location: [facility];  Service: General;  Laterality: N/A;  LMA   HERNIA REPAIR     x 2   INGUINAL HERNIA REPAIR Right 11/09/2020   Procedure: OPEN REPAIR RIGHT INGUINAL HERNIA;  Surgeon: Oralee Billow, MD;  Location: [facility] OR;  Service: General;  Laterality: Right;   INSERTION OF MESH Right 11/09/2020   Procedure: INSERTION OF MESH;  Surgeon: Oralee Billow, MD;  Location: [facility] OR;  Service: General;  Laterality: Right;   RETINAL DETACHMENT SURGERY     retinal laser surgery Left    plate placed   RIGHT/LEFT HEART CATH AND CORONARY ANGIOGRAPHY N/A 09/08/2020   Procedure: RIGHT/LEFT HEART CATH AND CORONARY  ANGIOGRAPHY;  Surgeon: Darlis Eisenmenger, MD;  Location: [facility] INVASIVE CV LAB;  Service: Cardiovascular;  Laterality: N/A;       Home Medications    Prior to Admission medications   Medication Sig Start Date End Date Taking? Authorizing Provider  Acetaminophen  (TYLENOL  ARTHRITIS PAIN PO) Take 650 mg by mouth as needed.    [provider]  Alirocumab  (PRALUENT ) 75 MG/ML SOAJ INJECT 1ML SUBCUTANEOUSLY EVERY 14 DAYS 11/01/22   Avanell Leigh, MD  aspirin  EC 81 MG tablet Take 81 mg by mouth every morning.    [provider]  Carboxymethylcellulose Sodium (EYE DROPS OP) Apply to eye as needed (Dry eyes). Thera tears    [provider]  carvedilol  (COREG ) 25 MG tablet Take 1 tablet (25 mg total) by mouth 2 (two) times daily with a meal. 07/28/22   Avanell Leigh, MD  cetirizine (ZYRTEC) 10 MG tablet Take 10 mg by mouth 2 (two) times daily. 05/18/21   [provider]  Chlorpheniramine Maleate (CHLOR-TRIMETON ALLERGY PO) Take by mouth as needed (Allergies).    [provider]  Cholecalciferol (VITAMIN D-3) 125 MCG (5000 UT) TABS Take 5,000 Units by mouth daily.    [provider]  clopidogrel  (PLAVIX ) 75 MG tablet TAKE 1 TABLET BY MOUTH EVERY DAY 06/09/23   Avanell Leigh, MD  doxazosin  (CARDURA ) 8 MG tablet TAKE 1 TABLET BY MOUTH EVERY DAY 06/01/23   Avanell Leigh, MD  isosorbide  mononitrate (IMDUR ) 60 MG 24 hr tablet TAKE 1 TABLET BY MOUTH EVERY DAY 12/05/22   Avanell Leigh, MD  Krill Oil 500 MG CAPS Take 500 mg by mouth at bedtime.    [provider]  metFORMIN  (GLUCOPHAGE -XR) 500 MG 24 hr tablet Take 1 tablet (500 mg total) by mouth 2 (two) times daily with breakfast and lunch. 09/11/20   Ruddy Corral M, [name]  nitroGLYCERIN  (NITROSTAT ) 0.4 MG SL tablet Place 1 tablet (0.4 mg total) under the tongue every 5 (five) minutes x 3 doses as needed for chest pain. 10/21/22   Avanell Leigh, MD  [facility] VERIO test strip 2 (two)  times daily. 10/07/19   [provider]  pantoprazole  (PROTONIX ) 40 MG tablet TAKE 1 TABLET BY MOUTH EVERY DAY 02/06/23   Avanell Leigh, MD  sacubitril -valsartan  (ENTRESTO ) 97-103 MG Take 1 tablet by mouth 2 (two) times daily. 01/25/23   Avanell Leigh, MD  spironolactone  (ALDACTONE ) 25 MG tablet Take 1 tablet (25 mg total) by mouth daily. 02/06/23   Avanell Leigh, MD    Family History Family History  Adopted: Yes  Problem Relation Age of Onset  Heart disease Father    Heart attack Father 56   Hypertension Father    Hyperlipidemia Father    Diabetes Daughter    Hyperlipidemia Daughter    Hypertension Daughter    Diabetes Daughter    Hyperlipidemia Daughter    Hypertension Daughter    Kidney disease Mother     Social History Social History   Tobacco Use   Smoking status: Never   Smokeless tobacco: Never  Vaping Use   Vaping status: Never Used  Substance Use Topics   Alcohol  use: No   Drug use: No     Allergies   Albuterol , Meloxicam, Morphine  and codeine, Statins, Cholestoff [plant sterols and stanols], Nsaids, Breo ellipta  [fluticasone furoate-vilanterol], Cortisone, Darvon [propoxyphene], Doxycycline, Emetrol, Glimepiride, Hydrochlorothiazide, Indomethacin, Jardiance  [empagliflozin ], Naproxen, Penicillins, Repatha  [evolocumab ], Sulfa antibiotics, Terbinafine, and Tramadol    Review of Systems Review of Systems See HPI  Physical Exam Triage Vital Signs ED Triage Vitals  Encounter Vitals Group     BP 07/10/23 1338 (!) 186/79     Systolic BP Percentile --      Diastolic BP Percentile --      Pulse Rate 07/10/23 1338 72     Resp 07/10/23 1338 20     Temp 07/10/23 1338 97.6 F (36.4 C)     Temp Source 07/10/23 1338 Oral     SpO2 07/10/23 1338 97 %     Weight --      Height --      Head Circumference --      Peak Flow --      Pain Score 07/10/23 1339 1     Pain Loc --      Pain Education --      Exclude from Growth Chart --    No data  found.  Updated Vital Signs BP (!) 186/79 (BP Location: Left Arm)   Pulse 72   Temp 97.6 F (36.4 C) (Oral)   Resp 20   SpO2 97%   Visual Acuity Right Eye Distance:   Left Eye Distance:   Bilateral Distance:    Right Eye Near:   Left Eye Near:    Bilateral Near:     Physical Exam Constitutional:      Appearance: Normal appearance.  Pulmonary:     Effort: Pulmonary effort is normal.  Musculoskeletal:        General: No swelling, tenderness, deformity or signs of injury. Normal range of motion.     Right lower leg: No edema.     Left lower leg: No edema.     Comments: Left lower extremity appears normal.  There is no tenderness with palpation of the entire lower extremity.  There is no swelling. 2+ pedal pulse with sensation and color of extremity. Varicose veins present  Skin:    Findings: No bruising or erythema.  Neurological:     Mental Status: He is alert.  Psychiatric:        Mood and Affect: Mood normal.      UC Treatments / Results  Labs (all labs ordered are listed, but only abnormal results are displayed) Labs Reviewed - No data to display  EKG   Radiology No results found.  Procedures Procedures (including critical care time)  Medications Ordered in UC Medications - No data to display  Initial Impression / Assessment and Plan / UC Course  I have reviewed the triage vital signs and the nursing notes.  Pertinent labs & imaging results that were available during my  care of the patient were reviewed by me and considered in my medical decision making (see chart for details).     LLE pain-I believe that the pain in his extremity could be originating from the sciatic nerve.  There is no concerns on exam for DVT, thrombophlebitis or infection.  He has also had some pains in the left lower back area at times.  Worse with prolonged sitting and standing. He has only done topical rubs to treat which have not helped.  He has a lot of allergies to  medications to include NSAIDs and steroids.  He can take Tylenol .  Recommended trying to take Tylenol  for the pain.  If this is site nerve he needs to try doing some stretching and alternate heat and ice.  Recommended if this problem persist that he will need to follow-up with his primary care for further management.  Final Clinical Impressions(s) / UC Diagnoses   Final diagnoses:  Pain of left lower extremity     Discharge Instructions      I believe that the leg pain is caused by the sciatic nerve.  I have given you some information about this and things that can help to include stretching, heat/ice.  I did not see ibuprofen on you allergy list. You can take 400 mg to 600 mg of this every 12 hours or so for pain and inflammation. Try this for the next few days and see if this helps. You can also do tylenol  for pain if unable to tolerate the ibuprofen  If this problem continues I would see your primary care doctor.   ED Prescriptions   None    PDMP not reviewed this encounter.   Landa Pine, FNP 07/10/23 1534

## 2023-07-10 NOTE — Discharge Instructions (Addendum)
 I believe that the leg pain is caused by the sciatic nerve.  I have given you some information about this and things that can help to include stretching, heat/ice.  I did not see ibuprofen on you allergy list. You can take 400 mg to 600 mg of this every 12 hours or so for pain and inflammation. Try this for the next few days and see if this helps. You can also do tylenol  for pain if unable to tolerate the ibuprofen  If this problem continues I would see your primary care doctor.

## 2023-07-10 NOTE — ED Triage Notes (Signed)
 Left lower leg pain x 2-3 weeks without injury. States sometimes when sitting in recliner, feels a sharp pain from left hip down to left calf. No swelling. No area of redness. Also reporting cough which he believes is due to environmental allergies.

## 2023-07-13 DIAGNOSIS — M4726 Other spondylosis with radiculopathy, lumbar region: Secondary | ICD-10-CM | POA: Diagnosis not present

## 2023-07-13 DIAGNOSIS — M9903 Segmental and somatic dysfunction of lumbar region: Secondary | ICD-10-CM | POA: Diagnosis not present

## 2023-07-13 DIAGNOSIS — M9904 Segmental and somatic dysfunction of sacral region: Secondary | ICD-10-CM | POA: Diagnosis not present

## 2023-07-13 DIAGNOSIS — M461 Sacroiliitis, not elsewhere classified: Secondary | ICD-10-CM | POA: Diagnosis not present

## 2023-07-13 DIAGNOSIS — M7918 Myalgia, other site: Secondary | ICD-10-CM | POA: Diagnosis not present

## 2023-07-13 DIAGNOSIS — M9902 Segmental and somatic dysfunction of thoracic region: Secondary | ICD-10-CM | POA: Diagnosis not present

## 2023-07-13 DIAGNOSIS — M9905 Segmental and somatic dysfunction of pelvic region: Secondary | ICD-10-CM | POA: Diagnosis not present

## 2023-07-17 DIAGNOSIS — M9904 Segmental and somatic dysfunction of sacral region: Secondary | ICD-10-CM | POA: Diagnosis not present

## 2023-07-17 DIAGNOSIS — M461 Sacroiliitis, not elsewhere classified: Secondary | ICD-10-CM | POA: Diagnosis not present

## 2023-07-17 DIAGNOSIS — M9905 Segmental and somatic dysfunction of pelvic region: Secondary | ICD-10-CM | POA: Diagnosis not present

## 2023-07-17 DIAGNOSIS — M9903 Segmental and somatic dysfunction of lumbar region: Secondary | ICD-10-CM | POA: Diagnosis not present

## 2023-07-17 DIAGNOSIS — M9902 Segmental and somatic dysfunction of thoracic region: Secondary | ICD-10-CM | POA: Diagnosis not present

## 2023-07-17 DIAGNOSIS — M4726 Other spondylosis with radiculopathy, lumbar region: Secondary | ICD-10-CM | POA: Diagnosis not present

## 2023-07-17 DIAGNOSIS — M7918 Myalgia, other site: Secondary | ICD-10-CM | POA: Diagnosis not present

## 2023-07-19 DIAGNOSIS — M461 Sacroiliitis, not elsewhere classified: Secondary | ICD-10-CM | POA: Diagnosis not present

## 2023-07-19 DIAGNOSIS — M9905 Segmental and somatic dysfunction of pelvic region: Secondary | ICD-10-CM | POA: Diagnosis not present

## 2023-07-19 DIAGNOSIS — M7918 Myalgia, other site: Secondary | ICD-10-CM | POA: Diagnosis not present

## 2023-07-19 DIAGNOSIS — M4726 Other spondylosis with radiculopathy, lumbar region: Secondary | ICD-10-CM | POA: Diagnosis not present

## 2023-07-19 DIAGNOSIS — M9903 Segmental and somatic dysfunction of lumbar region: Secondary | ICD-10-CM | POA: Diagnosis not present

## 2023-07-19 DIAGNOSIS — M9902 Segmental and somatic dysfunction of thoracic region: Secondary | ICD-10-CM | POA: Diagnosis not present

## 2023-07-19 DIAGNOSIS — M9904 Segmental and somatic dysfunction of sacral region: Secondary | ICD-10-CM | POA: Diagnosis not present

## 2023-07-21 DIAGNOSIS — M9902 Segmental and somatic dysfunction of thoracic region: Secondary | ICD-10-CM | POA: Diagnosis not present

## 2023-07-21 DIAGNOSIS — M7918 Myalgia, other site: Secondary | ICD-10-CM | POA: Diagnosis not present

## 2023-07-21 DIAGNOSIS — N481 Balanitis: Secondary | ICD-10-CM | POA: Diagnosis not present

## 2023-07-21 DIAGNOSIS — M4726 Other spondylosis with radiculopathy, lumbar region: Secondary | ICD-10-CM | POA: Diagnosis not present

## 2023-07-21 DIAGNOSIS — M9903 Segmental and somatic dysfunction of lumbar region: Secondary | ICD-10-CM | POA: Diagnosis not present

## 2023-07-21 DIAGNOSIS — M461 Sacroiliitis, not elsewhere classified: Secondary | ICD-10-CM | POA: Diagnosis not present

## 2023-07-21 DIAGNOSIS — M9905 Segmental and somatic dysfunction of pelvic region: Secondary | ICD-10-CM | POA: Diagnosis not present

## 2023-07-21 DIAGNOSIS — N433 Hydrocele, unspecified: Secondary | ICD-10-CM | POA: Diagnosis not present

## 2023-07-21 DIAGNOSIS — M9904 Segmental and somatic dysfunction of sacral region: Secondary | ICD-10-CM | POA: Diagnosis not present

## 2023-07-24 DIAGNOSIS — M9902 Segmental and somatic dysfunction of thoracic region: Secondary | ICD-10-CM | POA: Diagnosis not present

## 2023-07-24 DIAGNOSIS — M9903 Segmental and somatic dysfunction of lumbar region: Secondary | ICD-10-CM | POA: Diagnosis not present

## 2023-07-24 DIAGNOSIS — M9905 Segmental and somatic dysfunction of pelvic region: Secondary | ICD-10-CM | POA: Diagnosis not present

## 2023-07-24 DIAGNOSIS — M4726 Other spondylosis with radiculopathy, lumbar region: Secondary | ICD-10-CM | POA: Diagnosis not present

## 2023-07-24 DIAGNOSIS — M9904 Segmental and somatic dysfunction of sacral region: Secondary | ICD-10-CM | POA: Diagnosis not present

## 2023-07-24 DIAGNOSIS — M461 Sacroiliitis, not elsewhere classified: Secondary | ICD-10-CM | POA: Diagnosis not present

## 2023-07-24 DIAGNOSIS — M7918 Myalgia, other site: Secondary | ICD-10-CM | POA: Diagnosis not present

## 2023-07-26 DIAGNOSIS — M9902 Segmental and somatic dysfunction of thoracic region: Secondary | ICD-10-CM | POA: Diagnosis not present

## 2023-07-26 DIAGNOSIS — M9903 Segmental and somatic dysfunction of lumbar region: Secondary | ICD-10-CM | POA: Diagnosis not present

## 2023-07-26 DIAGNOSIS — M9904 Segmental and somatic dysfunction of sacral region: Secondary | ICD-10-CM | POA: Diagnosis not present

## 2023-07-26 DIAGNOSIS — M9905 Segmental and somatic dysfunction of pelvic region: Secondary | ICD-10-CM | POA: Diagnosis not present

## 2023-07-26 DIAGNOSIS — M7918 Myalgia, other site: Secondary | ICD-10-CM | POA: Diagnosis not present

## 2023-07-26 DIAGNOSIS — M4726 Other spondylosis with radiculopathy, lumbar region: Secondary | ICD-10-CM | POA: Diagnosis not present

## 2023-07-26 DIAGNOSIS — M461 Sacroiliitis, not elsewhere classified: Secondary | ICD-10-CM | POA: Diagnosis not present

## 2023-07-31 DIAGNOSIS — M9902 Segmental and somatic dysfunction of thoracic region: Secondary | ICD-10-CM | POA: Diagnosis not present

## 2023-07-31 DIAGNOSIS — M7918 Myalgia, other site: Secondary | ICD-10-CM | POA: Diagnosis not present

## 2023-07-31 DIAGNOSIS — M9904 Segmental and somatic dysfunction of sacral region: Secondary | ICD-10-CM | POA: Diagnosis not present

## 2023-07-31 DIAGNOSIS — M4726 Other spondylosis with radiculopathy, lumbar region: Secondary | ICD-10-CM | POA: Diagnosis not present

## 2023-07-31 DIAGNOSIS — M9903 Segmental and somatic dysfunction of lumbar region: Secondary | ICD-10-CM | POA: Diagnosis not present

## 2023-07-31 DIAGNOSIS — M461 Sacroiliitis, not elsewhere classified: Secondary | ICD-10-CM | POA: Diagnosis not present

## 2023-07-31 DIAGNOSIS — M9905 Segmental and somatic dysfunction of pelvic region: Secondary | ICD-10-CM | POA: Diagnosis not present

## 2023-08-04 DIAGNOSIS — M4726 Other spondylosis with radiculopathy, lumbar region: Secondary | ICD-10-CM | POA: Diagnosis not present

## 2023-08-04 DIAGNOSIS — M9904 Segmental and somatic dysfunction of sacral region: Secondary | ICD-10-CM | POA: Diagnosis not present

## 2023-08-04 DIAGNOSIS — M461 Sacroiliitis, not elsewhere classified: Secondary | ICD-10-CM | POA: Diagnosis not present

## 2023-08-04 DIAGNOSIS — M9905 Segmental and somatic dysfunction of pelvic region: Secondary | ICD-10-CM | POA: Diagnosis not present

## 2023-08-04 DIAGNOSIS — M9903 Segmental and somatic dysfunction of lumbar region: Secondary | ICD-10-CM | POA: Diagnosis not present

## 2023-08-04 DIAGNOSIS — M7918 Myalgia, other site: Secondary | ICD-10-CM | POA: Diagnosis not present

## 2023-08-04 DIAGNOSIS — M9902 Segmental and somatic dysfunction of thoracic region: Secondary | ICD-10-CM | POA: Diagnosis not present

## 2023-08-07 DIAGNOSIS — M9904 Segmental and somatic dysfunction of sacral region: Secondary | ICD-10-CM | POA: Diagnosis not present

## 2023-08-07 DIAGNOSIS — M9905 Segmental and somatic dysfunction of pelvic region: Secondary | ICD-10-CM | POA: Diagnosis not present

## 2023-08-07 DIAGNOSIS — M7918 Myalgia, other site: Secondary | ICD-10-CM | POA: Diagnosis not present

## 2023-08-07 DIAGNOSIS — M9903 Segmental and somatic dysfunction of lumbar region: Secondary | ICD-10-CM | POA: Diagnosis not present

## 2023-08-07 DIAGNOSIS — M461 Sacroiliitis, not elsewhere classified: Secondary | ICD-10-CM | POA: Diagnosis not present

## 2023-08-07 DIAGNOSIS — M4726 Other spondylosis with radiculopathy, lumbar region: Secondary | ICD-10-CM | POA: Diagnosis not present

## 2023-08-07 DIAGNOSIS — M9902 Segmental and somatic dysfunction of thoracic region: Secondary | ICD-10-CM | POA: Diagnosis not present

## 2023-08-08 DIAGNOSIS — R102 Pelvic and perineal pain: Secondary | ICD-10-CM | POA: Diagnosis not present

## 2023-08-08 DIAGNOSIS — Z9889 Other specified postprocedural states: Secondary | ICD-10-CM | POA: Diagnosis not present

## 2023-08-08 DIAGNOSIS — Z8719 Personal history of other diseases of the digestive system: Secondary | ICD-10-CM | POA: Diagnosis not present

## 2023-08-09 DIAGNOSIS — M461 Sacroiliitis, not elsewhere classified: Secondary | ICD-10-CM | POA: Diagnosis not present

## 2023-08-09 DIAGNOSIS — M9905 Segmental and somatic dysfunction of pelvic region: Secondary | ICD-10-CM | POA: Diagnosis not present

## 2023-08-09 DIAGNOSIS — M7918 Myalgia, other site: Secondary | ICD-10-CM | POA: Diagnosis not present

## 2023-08-09 DIAGNOSIS — M9904 Segmental and somatic dysfunction of sacral region: Secondary | ICD-10-CM | POA: Diagnosis not present

## 2023-08-09 DIAGNOSIS — M9903 Segmental and somatic dysfunction of lumbar region: Secondary | ICD-10-CM | POA: Diagnosis not present

## 2023-08-09 DIAGNOSIS — M4726 Other spondylosis with radiculopathy, lumbar region: Secondary | ICD-10-CM | POA: Diagnosis not present

## 2023-08-09 DIAGNOSIS — M9902 Segmental and somatic dysfunction of thoracic region: Secondary | ICD-10-CM | POA: Diagnosis not present

## 2023-08-14 DIAGNOSIS — M9905 Segmental and somatic dysfunction of pelvic region: Secondary | ICD-10-CM | POA: Diagnosis not present

## 2023-08-14 DIAGNOSIS — E119 Type 2 diabetes mellitus without complications: Secondary | ICD-10-CM | POA: Diagnosis not present

## 2023-08-14 DIAGNOSIS — H25011 Cortical age-related cataract, right eye: Secondary | ICD-10-CM | POA: Diagnosis not present

## 2023-08-14 DIAGNOSIS — H18422 Band keratopathy, left eye: Secondary | ICD-10-CM | POA: Diagnosis not present

## 2023-08-14 DIAGNOSIS — M4726 Other spondylosis with radiculopathy, lumbar region: Secondary | ICD-10-CM | POA: Diagnosis not present

## 2023-08-14 DIAGNOSIS — M9902 Segmental and somatic dysfunction of thoracic region: Secondary | ICD-10-CM | POA: Diagnosis not present

## 2023-08-14 DIAGNOSIS — H2511 Age-related nuclear cataract, right eye: Secondary | ICD-10-CM | POA: Diagnosis not present

## 2023-08-14 DIAGNOSIS — M7918 Myalgia, other site: Secondary | ICD-10-CM | POA: Diagnosis not present

## 2023-08-14 DIAGNOSIS — M461 Sacroiliitis, not elsewhere classified: Secondary | ICD-10-CM | POA: Diagnosis not present

## 2023-08-14 DIAGNOSIS — M9904 Segmental and somatic dysfunction of sacral region: Secondary | ICD-10-CM | POA: Diagnosis not present

## 2023-08-14 DIAGNOSIS — M9903 Segmental and somatic dysfunction of lumbar region: Secondary | ICD-10-CM | POA: Diagnosis not present

## 2023-08-14 DIAGNOSIS — H5211 Myopia, right eye: Secondary | ICD-10-CM | POA: Diagnosis not present

## 2023-08-16 DIAGNOSIS — M9905 Segmental and somatic dysfunction of pelvic region: Secondary | ICD-10-CM | POA: Diagnosis not present

## 2023-08-16 DIAGNOSIS — M9903 Segmental and somatic dysfunction of lumbar region: Secondary | ICD-10-CM | POA: Diagnosis not present

## 2023-08-16 DIAGNOSIS — M461 Sacroiliitis, not elsewhere classified: Secondary | ICD-10-CM | POA: Diagnosis not present

## 2023-08-16 DIAGNOSIS — M9904 Segmental and somatic dysfunction of sacral region: Secondary | ICD-10-CM | POA: Diagnosis not present

## 2023-08-16 DIAGNOSIS — M9902 Segmental and somatic dysfunction of thoracic region: Secondary | ICD-10-CM | POA: Diagnosis not present

## 2023-08-16 DIAGNOSIS — M7918 Myalgia, other site: Secondary | ICD-10-CM | POA: Diagnosis not present

## 2023-08-16 DIAGNOSIS — M4726 Other spondylosis with radiculopathy, lumbar region: Secondary | ICD-10-CM | POA: Diagnosis not present

## 2023-08-21 DIAGNOSIS — M9902 Segmental and somatic dysfunction of thoracic region: Secondary | ICD-10-CM | POA: Diagnosis not present

## 2023-08-21 DIAGNOSIS — M4726 Other spondylosis with radiculopathy, lumbar region: Secondary | ICD-10-CM | POA: Diagnosis not present

## 2023-08-21 DIAGNOSIS — M9904 Segmental and somatic dysfunction of sacral region: Secondary | ICD-10-CM | POA: Diagnosis not present

## 2023-08-21 DIAGNOSIS — M461 Sacroiliitis, not elsewhere classified: Secondary | ICD-10-CM | POA: Diagnosis not present

## 2023-08-21 DIAGNOSIS — M7918 Myalgia, other site: Secondary | ICD-10-CM | POA: Diagnosis not present

## 2023-08-21 DIAGNOSIS — M9905 Segmental and somatic dysfunction of pelvic region: Secondary | ICD-10-CM | POA: Diagnosis not present

## 2023-08-21 DIAGNOSIS — M9903 Segmental and somatic dysfunction of lumbar region: Secondary | ICD-10-CM | POA: Diagnosis not present

## 2023-08-22 ENCOUNTER — Telehealth: Payer: Self-pay | Admitting: Cardiovascular Disease

## 2023-08-22 NOTE — Telephone Encounter (Signed)
 Called ems last night- was in bed, resting when pain started- he took a ntg and 4 chewable aspirin - bp was 182/80 Took about an hour before his pain went away- but all went away last night.   He has several different problems going on at the same time.   Bp this morning was 110/60- no active chest pain at this time, no jaw or arm pain. No shortness of breath, no nausea or dizziness.  Given ER precautions and told if 3 doses of NTG does not relieve chest/arm/jaw pain- needs to go to the ER.  The patient and daughter verbalized understanding. Have appt tomorrow 08/23/23 with Dr Court.

## 2023-08-22 NOTE — Telephone Encounter (Signed)
 Pt c/o of Chest Pain: STAT if active (IN THIS MOMENT) CP, including tightness, pressure, jaw pain, shoulder/upper arm/back pain, SOB, nausea, and vomiting.  1. Are you having CP right now (tightness, pressure, or discomfort)? No   2. Are you experiencing any other symptoms (ex. SOB, nausea, vomiting, sweating)? Left arm, jaw pain yesterday. They called EMS and his BP was 182/80 last night. EMS did EKG which was normal. Pt not having any symptoms today.   3. How long have you been experiencing CP? Only last night   4. Is your CP continuous or coming and going? Coming and going   5. Have you taken Nitroglycerin ? Yes   6. If CP returns before callback, please consider calling 911. ?

## 2023-08-22 NOTE — Telephone Encounter (Signed)
 Called and asked if they could move the appt up to 0900 due to scheduling issues. She said they would be able to come at 0900 in the morning for an appointment. Appt changes

## 2023-08-23 ENCOUNTER — Encounter: Payer: Self-pay | Admitting: Cardiovascular Disease

## 2023-08-23 ENCOUNTER — Ambulatory Visit: Attending: Cardiology | Admitting: Cardiovascular Disease

## 2023-08-23 ENCOUNTER — Ambulatory Visit: Admitting: Cardiovascular Disease

## 2023-08-23 VITALS — BP 182/80 | HR 66 | Ht 66.0 in | Wt 147.8 lb

## 2023-08-23 DIAGNOSIS — I2510 Atherosclerotic heart disease of native coronary artery without angina pectoris: Secondary | ICD-10-CM | POA: Diagnosis not present

## 2023-08-23 DIAGNOSIS — I5020 Unspecified systolic (congestive) heart failure: Secondary | ICD-10-CM | POA: Diagnosis not present

## 2023-08-23 DIAGNOSIS — E782 Mixed hyperlipidemia: Secondary | ICD-10-CM | POA: Diagnosis not present

## 2023-08-23 DIAGNOSIS — R002 Palpitations: Secondary | ICD-10-CM

## 2023-08-23 DIAGNOSIS — R059 Cough, unspecified: Secondary | ICD-10-CM | POA: Diagnosis not present

## 2023-08-23 DIAGNOSIS — I10 Essential (primary) hypertension: Secondary | ICD-10-CM | POA: Diagnosis not present

## 2023-08-23 NOTE — Assessment & Plan Note (Signed)
 History of left carotid endarterectomy performed by Dr. Ledon with Doppler studies performed 11/23/2022 revealing a widely patent endarterectomy site.  This will be repeated on an annual basis.

## 2023-08-23 NOTE — Assessment & Plan Note (Signed)
 History of hyperlipidemia not on statin therapy but on Praluent  with lipid profile performed 2 thought/3/25 revealing total cholesterol 133, LDL 66 and HDL 41.

## 2023-08-23 NOTE — Patient Instructions (Signed)
 Medication Instructions:  Your physician recommends that you continue on your current medications as directed. Please refer to the Current Medication list given to you today.  *If you need a refill on your cardiac medications before your next appointment, please call your pharmacy*    Follow-Up: At [facility], you and your health needs are our priority.  As part of our continuing mission to provide you with exceptional heart care, our providers are all part of one team.  This team includes your primary Cardiologist (physician) and Advanced Practice Providers or APPs (Physician Assistants and Nurse Practitioners) who all work together to provide you with the care you need, when you need it.  Your next appointment:   3 month(s)  Provider:   Jon Hails, [name], Callie Goodrich, [name], Hao Meng, [name], Damien Braver, NP, or Katlyn West, NP         Then, Dorn Lesches, MD will plan to see you again in 12 month(s).     We recommend signing up for the patient portal called MyChart.  Sign up information is provided on this After Visit Summary.  MyChart is used to connect with patients for Virtual Visits (Telemedicine).  Patients are able to view lab/test results, encounter notes, upcoming appointments, etc.  Non-urgent messages can be sent to your provider as well.   To learn more about what you can do with MyChart, go to [email or web address].   Other Instructions Dr. Lesches has requested that you schedule an appointment with one of our clinical pharmacists for a blood pressure check appointment within the next 6-8 weeks.  If you monitor your blood pressure (BP) at home, please bring your BP cuff and your BP readings with you to this appointment  HOW TO TAKE YOUR BLOOD PRESSURE: Rest 5 minutes before taking your blood pressure. Don't smoke or drink caffeinated beverages for at least 30 minutes before. Take your blood pressure before (not after) you eat. Sit comfortably with your  back supported and both feet on the floor (don't cross your legs). Elevate your arm to heart level on a table or a desk. Use the proper sized cuff. It should fit smoothly and snugly around your bare upper arm. There should be enough room to slip a fingertip under the cuff. The bottom edge of the cuff should be 1 inch above the crease of the elbow. Ideally, take 3 measurements at one sitting and record the average.

## 2023-08-23 NOTE — Assessment & Plan Note (Signed)
 History of CAD status post right left heart cath by Dr. Rolan 09/09/2020 revealing left main/three-vessel disease disease and severe LV dysfunction.  CABG was recommended.  The patient declined intervention and opted for medical therapy.  He denies chest pain but has had some left upper extremity pain which does not sound anginal.

## 2023-08-23 NOTE — Assessment & Plan Note (Signed)
 History of ischemic cardiomyopathy with an EF in the 35 to 40% range by 2D echo performed 03/28/2022 on GDMT.  He did have grade 1 diastolic dysfunction as well.

## 2023-08-23 NOTE — Progress Notes (Signed)
 08/23/2023 David Sellers   [birth year]  [id number]  Primary Physician Gerome Brunet, DO Primary Cardiologist: David JINNY Lesches MD David Sellers, David Sellers  HPI:  David Sellers is a 83 y.o.    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 Carlin Sellers for cardiovascular evaluation and preoperative screening prior to elective left carotid endarterectomy.  He is accompanied by his wife David Sellers today.  I last saw him in the office 05/29/23.[name] His cardiovascular factor profile is positive for hypertension, dyslipidemia, and family history of heart disease with a father who died of a myocardial infarction in his 67s. He has never had a heart attack or stroke. He denies chest pain or shortness of breath.  He did have pain in his left neck and had incidentally found moderate left internal carotid artery stenosis. A Myoview  stress test performed for preoperative clearance 04/23/13 was entirely normal. He underwent uncomplicated left carotid endarterectomy by Dr. Haddock on 05/08/13 with excellent operative result although the patient does have some left facial numbness as result. SinceI saw him a year ago he has remained stable.   He is been under a lot of stress lately with his wife having kidney infection him having upper respiratory tract infection although he checks his blood pressure at home and it runs in the 135/70 range.     He has been diagnosed with diabetes and is seeing an endocrinologist.  He is placed on a diet is lost 15 pounds.    He has taken 3 Covid shots and developed pneumonia after these.  He has had a chronic cough for months now followed by pulmonologist.  A chest CT performed 03/28/2020 showed small bilateral pleural effusions, three-vessel coronary atherosclerosis with widespread moderate patchy regions of groundglass opacity and consolidation involving all lung lobes.  A 2D echocardiogram performed 06/24/2020 revealed severe LV  dysfunction with an EF in the 20 to 25% range.  I did refer him to David Sellers  who ultimately performed right left heart cath on him 09/08/2020 revealing left main/three-vessel disease with low filling pressures.  He was evaluated for CABG and PCI and he decided only to pursue medical therapy.  He is completely asymptomatic.  Does have a right inguinal hernia which he is symptomatic from and wishes to have this surgically corrected by David Sellers.  He is at high risk for this given his LV dysfunction and unrevascularized three-vessel disease.  He was hospitalized 03/27/2022 with chest pain/non-STEMI with a troponin that went up to 375.  His EF has remained depressed at 35 to 40% range on GDMT.   Since I saw him in the office 3 months ago he has developed a sinus condition and a postnasal cough.  This is improved by lying on his left side however this has caused some left arm pain.  He also has some left foot pain.  I suspect his arm pain is radicular versus orthopedic.  It does not sound anginal or ischemically mediated.  His blood pressure also has been elevated possibly related to the pain.   Current Meds  Medication Sig   Acetaminophen  (TYLENOL  ARTHRITIS PAIN PO) Take 650 mg by mouth as needed.   Alirocumab  (PRALUENT ) 75 MG/ML SOAJ INJECT 1ML SUBCUTANEOUSLY EVERY 14 DAYS   aspirin  EC 81 MG tablet Take 81 mg by mouth every morning.   Carboxymethylcellulose Sodium (EYE DROPS OP) Apply to eye as needed (Dry eyes). Thera tears  carvedilol  (COREG ) 25 MG tablet Take 1 tablet (25 mg total) by mouth 2 (two) times daily with a meal.   cetirizine (ZYRTEC) 10 MG tablet Take 10 mg by mouth 2 (two) times daily.   Chlorpheniramine Maleate (CHLOR-TRIMETON ALLERGY PO) Take by mouth as needed (Allergies).   Cholecalciferol (VITAMIN D-3) 125 MCG (5000 UT) TABS Take 5,000 Units by mouth daily.   clopidogrel  (PLAVIX ) 75 MG tablet TAKE 1 TABLET BY MOUTH EVERY DAY   doxazosin  (CARDURA ) 8 MG tablet TAKE 1 TABLET BY MOUTH  EVERY DAY   isosorbide  mononitrate (IMDUR ) 60 MG 24 hr tablet TAKE 1 TABLET BY MOUTH EVERY DAY   Krill Oil 500 MG CAPS Take 500 mg by mouth at bedtime.   metFORMIN  (GLUCOPHAGE -XR) 500 MG 24 hr tablet Take 1 tablet (500 mg total) by mouth 2 (two) times daily with breakfast and lunch.   nitroGLYCERIN  (NITROSTAT ) 0.4 MG SL tablet Place 1 tablet (0.4 mg total) under the tongue every 5 (five) minutes x 3 doses as needed for chest pain.   ONETOUCH VERIO test strip 2 (two) times daily.   pantoprazole  (PROTONIX ) 40 MG tablet TAKE 1 TABLET BY MOUTH EVERY DAY   sacubitril -valsartan  (ENTRESTO ) 97-103 MG Take 1 tablet by mouth 2 (two) times daily.   spironolactone  (ALDACTONE ) 25 MG tablet Take 1 tablet (25 mg total) by mouth daily.     Allergies  Allergen Reactions   Albuterol  Anaphylaxis    Per patient and daughter   Meloxicam Itching, Palpitations and Other (See Comments)    Headaches, Mood swings.   Morphine  And Codeine Nausea And Vomiting   Statins Other (See Comments)    Myalgias. Severe arthritic response. Tolerates Lovastatin.    Cholestoff [Plant Sterols And Stanols] Other (See Comments)    Muscle aches   Nsaids Other (See Comments)    Aleve ,  Myalgia   Breo Ellipta  [Fluticasone Furoate-Vilanterol]     thrush   Cortisone Hypertension    Patient states cortisone injections he has received for his knees cause hypertension and hyperglycemia   Darvon [Propoxyphene] Other (See Comments)    hallucinate   Doxycycline Cough   Emetrol     Other reaction(s): Hallucinate   Glimepiride Itching   Hydrochlorothiazide     Other reaction(s): Low Blood Pressure   Indomethacin     Neck pain flare   Jardiance  [Empagliflozin ]     hallucinations   Naproxen     Other reaction(s): Increased Liver Count   Penicillins     *positive allergy test*   Repatha  [Evolocumab ] Other (See Comments)    hyperglycemia   Sulfa Antibiotics Nausea And Vomiting   Terbinafine Diarrhea   Tramadol  Other (See  Comments)    Hallucinations     Social History   Socioeconomic History   Marital status: Married    Spouse name: Not on file   Number of children: Not on file   Years of education: Not on file   Highest education level: Not on file  Occupational History   Not on file  Tobacco Use   Smoking status: Never   Smokeless tobacco: Never  Vaping Use   Vaping status: Never Used  Substance and Sexual Activity   Alcohol  use: No   Drug use: No   Sexual activity: Yes  Other Topics Concern   Not on file  Social History Narrative   Not on file   Social Drivers of Health   Financial Resource Strain: Not on file  Food Insecurity: No  Food Insecurity (03/28/2022)   Hunger Vital Sign    Worried About Running Out of Food in the Last Year: Never true    Ran Out of Food in the Last Year: Never true  Transportation Needs: No Transportation Needs (03/28/2022)   PRAPARE - Administrator, Civil Service (Medical): No    Lack of Transportation (Non-Medical): No  Physical Activity: Not on file  Stress: Not on file  Social Connections: Not on file  Intimate Partner Violence: Not At Risk (03/28/2022)   Humiliation, Afraid, Rape, and Kick questionnaire    Fear of Current or Ex-Partner: No    Emotionally Abused: No    Physically Abused: No    Sexually Abused: No     Review of Systems: General: negative for chills, fever, night sweats or weight changes.  Cardiovascular: negative for chest pain, dyspnea on exertion, edema, orthopnea, palpitations, paroxysmal nocturnal dyspnea or shortness of breath Dermatological: negative for rash Respiratory: negative for cough or wheezing Urologic: negative for hematuria Abdominal: negative for nausea, vomiting, diarrhea, bright red blood per rectum, melena, or hematemesis Neurologic: negative for visual changes, syncope, or dizziness All other systems reviewed and are otherwise negative except as noted above.    Blood pressure (!) 182/80, pulse  66, height 5' 6 (1.676 m), weight 147 lb 12.8 oz (67 kg), SpO2 97%.  General appearance: alert and no distress Neck: no adenopathy, no JVD, supple, symmetrical, trachea midline, thyroid  not enlarged, symmetric, no tenderness/mass/nodules, and right carotid bruit Lungs: clear to auscultation bilaterally Heart: regular rate and rhythm, S1, S2 normal, no murmur, click, rub or gallop Extremities: extremities normal, atraumatic, no cyanosis or edema Pulses: 2+ and symmetric Skin: Skin color, texture, turgor normal. No rashes or lesions Neurologic: Grossly normal  EKG EKG Interpretation Date/Time:  Wednesday August 23 2023 08:57:48 EDT Ventricular Rate:  66 PR Interval:  150 QRS Duration:  112 QT Interval:  410 QTC Calculation: 429 R Axis:   -24  Text Interpretation: Normal sinus rhythm Moderate voltage criteria for LVH, may be normal variant ( R in aVL , Cornell product ) ST & T wave abnormality, consider inferior ischemia When compared with ECG of 29-May-2023 13:23, T wave inversion now evident in Inferior leads T wave inversion more evident in Lateral leads Confirmed by Court Carrier [phone]) on 08/23/2023 9:12:07 AM    ASSESSMENT AND PLAN:   Occlusion and stenosis of carotid artery without mention of cerebral infarction History of left carotid endarterectomy performed by Dr. Ledon with Doppler studies performed 11/23/2022 revealing a widely patent endarterectomy site.  This will be repeated on an annual basis.  Essential hypertension History of essential hypertension her blood pressure measured today at 182/80.  He does take his blood pressure at home and it has been as high this morning as well.  He is on carvedilol  and Entresto .  It is possible this is related to pain from his left arm.  The etiology of which is still unclear.  I have asked him to keep a 30-day blood pressure log and come back to see a Pharm.D. in 4 to 6 weeks.  He will see an APP back after  that.  Hyperlipidemia History of hyperlipidemia not on statin therapy but on Praluent  with lipid profile performed 2 thought/3/25 revealing total cholesterol 133, LDL 66 and HDL 41.  HFrEF (heart failure with reduced ejection fraction) (HCC) History of ischemic cardiomyopathy with an EF in the 35 to 40% range by 2D echo performed 03/28/2022 on GDMT.  He did have grade 1 diastolic dysfunction as well.  Coronary artery disease History of CAD status post right left heart cath by David Sellers 09/09/2020 revealing left main/three-vessel disease disease and severe LV dysfunction.  CABG was recommended.  The patient declined intervention and opted for medical therapy.  He denies chest pain but has had some left upper extremity pain which does not sound anginal.     David DOROTHA Lesches MD [facility], [facility] 08/23/2023 9:17 AM

## 2023-08-23 NOTE — Assessment & Plan Note (Signed)
 History of essential hypertension her blood pressure measured today at 182/80.  He does take his blood pressure at home and it has been as high this morning as well.  He is on carvedilol  and Entresto .  It is possible this is related to pain from his left arm.  The etiology of which is still unclear.  I have asked him to keep a 30-day blood pressure log and come back to see a Pharm.D. in 4 to 6 weeks.  He will see an APP back after that.

## 2023-08-25 DIAGNOSIS — M9905 Segmental and somatic dysfunction of pelvic region: Secondary | ICD-10-CM | POA: Diagnosis not present

## 2023-08-25 DIAGNOSIS — M461 Sacroiliitis, not elsewhere classified: Secondary | ICD-10-CM | POA: Diagnosis not present

## 2023-08-25 DIAGNOSIS — M9902 Segmental and somatic dysfunction of thoracic region: Secondary | ICD-10-CM | POA: Diagnosis not present

## 2023-08-25 DIAGNOSIS — M9903 Segmental and somatic dysfunction of lumbar region: Secondary | ICD-10-CM | POA: Diagnosis not present

## 2023-08-25 DIAGNOSIS — M9904 Segmental and somatic dysfunction of sacral region: Secondary | ICD-10-CM | POA: Diagnosis not present

## 2023-08-25 DIAGNOSIS — M7918 Myalgia, other site: Secondary | ICD-10-CM | POA: Diagnosis not present

## 2023-08-25 DIAGNOSIS — M4726 Other spondylosis with radiculopathy, lumbar region: Secondary | ICD-10-CM | POA: Diagnosis not present

## 2023-08-28 DIAGNOSIS — M7918 Myalgia, other site: Secondary | ICD-10-CM | POA: Diagnosis not present

## 2023-08-28 DIAGNOSIS — M4726 Other spondylosis with radiculopathy, lumbar region: Secondary | ICD-10-CM | POA: Diagnosis not present

## 2023-08-28 DIAGNOSIS — M461 Sacroiliitis, not elsewhere classified: Secondary | ICD-10-CM | POA: Diagnosis not present

## 2023-08-28 DIAGNOSIS — M9904 Segmental and somatic dysfunction of sacral region: Secondary | ICD-10-CM | POA: Diagnosis not present

## 2023-08-28 DIAGNOSIS — M9903 Segmental and somatic dysfunction of lumbar region: Secondary | ICD-10-CM | POA: Diagnosis not present

## 2023-08-28 DIAGNOSIS — M9902 Segmental and somatic dysfunction of thoracic region: Secondary | ICD-10-CM | POA: Diagnosis not present

## 2023-08-28 DIAGNOSIS — M9905 Segmental and somatic dysfunction of pelvic region: Secondary | ICD-10-CM | POA: Diagnosis not present

## 2023-09-01 DIAGNOSIS — M9903 Segmental and somatic dysfunction of lumbar region: Secondary | ICD-10-CM | POA: Diagnosis not present

## 2023-09-01 DIAGNOSIS — M9905 Segmental and somatic dysfunction of pelvic region: Secondary | ICD-10-CM | POA: Diagnosis not present

## 2023-09-01 DIAGNOSIS — M4726 Other spondylosis with radiculopathy, lumbar region: Secondary | ICD-10-CM | POA: Diagnosis not present

## 2023-09-01 DIAGNOSIS — M461 Sacroiliitis, not elsewhere classified: Secondary | ICD-10-CM | POA: Diagnosis not present

## 2023-09-01 DIAGNOSIS — M7918 Myalgia, other site: Secondary | ICD-10-CM | POA: Diagnosis not present

## 2023-09-01 DIAGNOSIS — M9904 Segmental and somatic dysfunction of sacral region: Secondary | ICD-10-CM | POA: Diagnosis not present

## 2023-09-01 DIAGNOSIS — M9902 Segmental and somatic dysfunction of thoracic region: Secondary | ICD-10-CM | POA: Diagnosis not present

## 2023-09-03 ENCOUNTER — Other Ambulatory Visit: Payer: Self-pay | Admitting: Cardiovascular Disease

## 2023-09-04 DIAGNOSIS — M9904 Segmental and somatic dysfunction of sacral region: Secondary | ICD-10-CM | POA: Diagnosis not present

## 2023-09-04 DIAGNOSIS — M9905 Segmental and somatic dysfunction of pelvic region: Secondary | ICD-10-CM | POA: Diagnosis not present

## 2023-09-04 DIAGNOSIS — M7918 Myalgia, other site: Secondary | ICD-10-CM | POA: Diagnosis not present

## 2023-09-04 DIAGNOSIS — M4726 Other spondylosis with radiculopathy, lumbar region: Secondary | ICD-10-CM | POA: Diagnosis not present

## 2023-09-04 DIAGNOSIS — M9902 Segmental and somatic dysfunction of thoracic region: Secondary | ICD-10-CM | POA: Diagnosis not present

## 2023-09-04 DIAGNOSIS — M461 Sacroiliitis, not elsewhere classified: Secondary | ICD-10-CM | POA: Diagnosis not present

## 2023-09-04 DIAGNOSIS — M9903 Segmental and somatic dysfunction of lumbar region: Secondary | ICD-10-CM | POA: Diagnosis not present

## 2023-09-05 ENCOUNTER — Ambulatory Visit: Admitting: Pulmonary Disease

## 2023-09-05 ENCOUNTER — Encounter: Payer: Self-pay | Admitting: Pulmonary Disease

## 2023-09-05 VITALS — BP 162/80 | HR 60 | Temp 97.1°F | Ht 66.0 in | Wt 146.8 lb

## 2023-09-05 DIAGNOSIS — R0602 Shortness of breath: Secondary | ICD-10-CM

## 2023-09-05 DIAGNOSIS — J3089 Other allergic rhinitis: Secondary | ICD-10-CM

## 2023-09-05 DIAGNOSIS — R053 Chronic cough: Secondary | ICD-10-CM | POA: Diagnosis not present

## 2023-09-05 LAB — NITRIC OXIDE: Nitric Oxide: 9

## 2023-09-05 MED ORDER — SPIRIVA RESPIMAT 2.5 MCG/ACT IN AERS: 2.0000 | INHALATION_SPRAY | Freq: Every day | RESPIRATORY_TRACT | 0 refills | Status: DC

## 2023-09-05 NOTE — Progress Notes (Signed)
 Subjective:    Patient ID: David Sellers, male    DOB: 04/05/1940, 83 y.o.   MRN: [id number]  Patient Care Team: Gerome Brunet, DO as PCP - General (Family Medicine) Court Dorn PARAS, MD as PCP - Cardiology (Cardiology) Court Dorn PARAS, MD as Consulting Physician (Cardiology) Chales Idol, MD as Consulting Physician (Urology) Charmayne Molly, MD as Consulting Physician (Ophthalmology) Josephina Aures, MD as Consulting Physician (Endocrinology)  Chief Complaint  Patient presents with   Consult    Cough, dry for 3.5 months. Drainage. No SOB or wheezing. Does not cough when he lays on his left side.    BACKGROUND: The patient is an 83 year old gentleman who presents for evaluation of a cough present for approximately 3-1/2 months.  He notes significant sinus drainage.  Prior patient of Dr. Annella last seen March 2022 at our [facility] [place] office.  Evaluation at that time also for cough.   HPI Discussed the use of AI scribe software for clinical note transcription with the patient, who gave verbal consent to proceed.  History of Present Illness   David Sellers is an 83 year old male who presents with a chronic cough lasting three and a half months. He was referred by Dr. Brunet Gerome for evaluation of his chronic cough.  He has experienced a persistent cough for three and a half months, which is alleviated when lying on his left side, helping him avoid coughing at night and during the day. He has been on anti-reflux medication but has not experienced heartburn or reflux symptoms for years.  He reports a loss of taste, attributing it to the use of cough drops, and has lost about three pounds since the middle of last week. He frequently removes his false teeth due to excessive clear mucus, which he describes as slippery, and finds that spitting it out provides some relief. He is unsure if the mucus is coming from his nose but describes it as drainage.  He has a history of  heart attacks and has been on heart medications, including Entresto , for two years. The dose of Entresto  was doubled over a year ago. No new heart medications in the past three and a half months.  He has a history of smoking pipes and cigars in his thirties but has not smoked since. He served in [employer] for four years, stationed in [place]  and [place]. 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 He also tends to cough after eating, a pattern he has noticed over the years.  He has tried various cough suppressants, including Hall's cough drops, which provide temporary relief, but other medications like Delsym and hydrocodone cough syrup have been ineffective. He has not found relief with nasal sprays either.  He has a knot on his arm that developed from lying on it to avoid coughing, which has been present for the past two or three days.   He does not endorse any fevers, chills or sweats.  No hemoptysis.  No chest pain.  No orthopnea or paroxysmal nocturnal dyspnea.  He has not had any recent imaging to evaluate the cough.  Most recent chest x-ray of February 2024 was consistent with cardiomegaly and pulmonary vascular congestion.     Review of Systems A 10 point review of  systems was performed and it is as noted above otherwise negative.   Past Medical History:  Diagnosis Date   Arthritis    Asthma    only has flareup with smelly perfume    BPH (benign prostatic hyperplasia)    Carotid artery occlusion    right ICA 50% stenosis   Carotodynia    Cataract    immature to the right eye   CHF (congestive heart failure) (HCC)    Coronary artery disease    Diabetes mellitus without complication (HCC)    borderline   GERD (gastroesophageal reflux disease)    takes Omeprazole daily    Headache(784.0)    r/t arthritis in neck    Hemorrhoids    History of bronchitis    last time 10-15yrs ago   History of colon polyps    Hyperlipidemia    no meds since Jan 1   Hypertension    takes Verapamil ,Lisinopril ,and Cardura  daily   Joint pain    Legally blind in left eye, as defined in [place]     hit by a baseball   Low back pain    reason unknown   Pneumonia    3 times in a row   Prolapsed hemorrhoids    Urinary frequency    sees Dr.Tannenbaum    Past Surgical History:  Procedure Laterality Date   CAROTID ENDARTERECTOMY Left 05/08/13   COLONOSCOPY     EGD with dilitation     ENDARTERECTOMY Left 05/08/2013   Procedure: LEFT CAROTID ARTERY ENDARTERECTOMY WITH DACRON PATCH ANGIOPLASTY;  Surgeon: Carlin FORBES Haddock, MD;  Location: [facility] OR;  Service: Vascular;  Laterality: Left;   EYE SURGERY     HEMORRHOID SURGERY N/A 08/13/2019   Procedure: OPEN HEMORRHOIDECTOMY;  Surgeon: Eletha Boas, MD;  Location: [facility];  Service: General;  Laterality: N/A;  LMA   HERNIA REPAIR     x 2   INGUINAL HERNIA REPAIR Right 11/09/2020   Procedure: OPEN REPAIR RIGHT INGUINAL HERNIA;  Surgeon: Eletha Boas, MD;  Location: [facility] OR;  Service: General;  Laterality: Right;   INSERTION OF MESH Right 11/09/2020   Procedure: INSERTION OF MESH;  Surgeon: Eletha Boas, MD;  Location: [facility] OR;  Service: General;  Laterality: Right;   RETINAL DETACHMENT SURGERY     retinal laser surgery Left    plate placed   RIGHT/LEFT HEART CATH AND CORONARY ANGIOGRAPHY N/A 09/08/2020   Procedure: RIGHT/LEFT HEART CATH AND CORONARY ANGIOGRAPHY;  Surgeon: Rolan Ezra RAMAN, MD;  Location: [facility];  Service: Cardiovascular;  Laterality: N/A;    Patient Active Problem List   Diagnosis Date Noted   Type 2 diabetes mellitus with complication, without long-term current use of insulin  (HCC) 03/30/2022   NSTEMI (non-ST elevated myocardial infarction) (HCC) 03/28/2022   Coronary artery disease 06/15/2021    Right inguinal hernia 11/08/2020   Acute on chronic heart failure (HCC) 09/09/2020   CHF (congestive heart failure) (HCC) 09/08/2020   Acute on chronic systolic (congestive) heart failure (HCC) 09/08/2020   HFrEF (heart failure with reduced ejection fraction) (HCC) 08/03/2020   Prolapsed internal hemorrhoids, grade 4 08/12/2019   Carotid stenosis 12/05/2013   Carotid artery stenosis 05/08/2013   Carotid artery disease (HCC) 04/17/2013   Essential hypertension 04/17/2013   Hyperlipidemia 04/17/2013   Family history of coronary artery disease 04/17/2013   Occlusion and stenosis of carotid artery without mention of cerebral infarction 04/11/2013    Family History  Adopted: Yes  Problem Relation Age of  Onset   Heart disease Father    Heart attack Father 78   Hypertension Father    Hyperlipidemia Father    Diabetes Daughter    Hyperlipidemia Daughter    Hypertension Daughter    Diabetes Daughter    Hyperlipidemia Daughter    Hypertension Daughter    Kidney disease Mother     Social History   Tobacco Use   Smoking status: Never   Smokeless tobacco: Never  Substance Use Topics   Alcohol  use: No    Allergies  Allergen Reactions   Albuterol  Anaphylaxis    Per patient and daughter   Meloxicam Itching, Palpitations and Other (See Comments)    Headaches, Mood swings.   Morphine  And Codeine Nausea And Vomiting   Statins Other (See Comments)    Myalgias. Severe arthritic response. Tolerates Lovastatin.    Cholestoff [Plant Sterols And Stanols] Other (See Comments)    Muscle aches   Nsaids Other (See Comments)    Aleve ,  Myalgia   Breo Ellipta  [Fluticasone Furoate-Vilanterol]     thrush   Cortisone Hypertension    Patient states cortisone injections he has received for his knees cause hypertension and hyperglycemia   Darvon [Propoxyphene] Other (See Comments)    hallucinate   Doxycycline Cough   Emetrol     Other reaction(s): Hallucinate   Glimepiride Itching    Hydrochlorothiazide     Other reaction(s): Low Blood Pressure   Indomethacin     Neck pain flare   Jardiance  [Empagliflozin ]     hallucinations   Naproxen     Other reaction(s): Increased Liver Count   Penicillins     *positive allergy test*   Repatha  [Evolocumab ] Other (See Comments)    hyperglycemia   Sulfa Antibiotics Nausea And Vomiting   Terbinafine Diarrhea   Tramadol  Other (See Comments)    Hallucinations     Current Meds  Medication Sig   Acetaminophen  (TYLENOL  ARTHRITIS PAIN PO) Take 650 mg by mouth as needed.   Alirocumab  (PRALUENT ) 75 MG/ML SOAJ INJECT 1ML SUBCUTANEOUSLY EVERY 14 DAYS   aspirin  EC 81 MG tablet Take 81 mg by mouth every morning.   Carboxymethylcellulose Sodium (EYE DROPS OP) Apply to eye as needed (Dry eyes). Thera tears   carvedilol  (COREG ) 25 MG tablet TAKE 1 TABLET (25 MG TOTAL) BY MOUTH TWICE A DAY WITH MEALS   cetirizine (ZYRTEC) 10 MG tablet Take 10 mg by mouth 2 (two) times daily.   Chlorpheniramine Maleate (CHLOR-TRIMETON ALLERGY PO) Take by mouth as needed (Allergies).   Cholecalciferol (VITAMIN D-3) 125 MCG (5000 UT) TABS Take 5,000 Units by mouth daily.   clopidogrel  (PLAVIX ) 75 MG tablet TAKE 1 TABLET BY MOUTH EVERY DAY   doxazosin  (CARDURA ) 8 MG tablet TAKE 1 TABLET BY MOUTH EVERY DAY   isosorbide  mononitrate (IMDUR ) 60 MG 24 hr tablet TAKE 1 TABLET BY MOUTH EVERY DAY   Krill Oil 500 MG CAPS Take 500 mg by mouth at bedtime.   metFORMIN  (GLUCOPHAGE -XR) 500 MG 24 hr tablet Take 1 tablet (500 mg total) by mouth 2 (two) times daily with breakfast and lunch.   nitroGLYCERIN  (NITROSTAT ) 0.4 MG SL tablet Place 1 tablet (0.4 mg total) under the tongue every 5 (five) minutes x 3 doses as needed for chest pain.   ONETOUCH VERIO test strip 2 (two) times daily.   pantoprazole  (PROTONIX ) 40 MG tablet TAKE 1 TABLET BY MOUTH EVERY DAY   sacubitril -valsartan  (ENTRESTO ) 97-103 MG Take 1 tablet by mouth 2 (two)  times daily.   spironolactone  (ALDACTONE ) 25  MG tablet Take 1 tablet (25 mg total) by mouth daily.   Tiotropium Bromide Monohydrate  (SPIRIVA  RESPIMAT) 2.5 MCG/ACT AERS Inhale 2 puffs into the lungs daily.    Immunization History  Administered Date(s) Administered   Fluad Quad(high Dose 65+) 10/07/2021   Fluad Trivalent(High Dose 65+) 10/19/2022   Influenza, High Dose Seasonal PF 10/19/2016, 10/11/2018   Influenza, Quadrivalent, Recombinant, Inj, Pf 10/17/2019   Influenza-Unspecified 10/23/2020   Moderna Sars-Covid-2 Vaccination 03/25/2019, 04/15/2019        Objective:     BP (!) 162/80 (BP Location: Left Arm, Cuff Size: Normal)   Pulse 60   Temp (!) 97.1 F (36.2 C)   Ht 5' 6 (1.676 m)   Wt 146 lb 12.8 oz (66.6 kg)   SpO2 99%   BMI 23.69 kg/m   SpO2: 99 % O2 Device: None (Room air)  GENERAL: Well-developed well-nourished gentleman, looks stated age, quite spry.  Fully ambulatory, no conversational dyspnea. HEAD: Normocephalic, atraumatic.  EYES: Pupils equal, round, reactive to light.  No scleral icterus.  MOUTH: Wears dentures, oral mucosa moist.  There is significant clear postnasal drip. NECK: Supple. No thyromegaly. Trachea midline. No JVD.  No adenopathy. PULMONARY: Good air entry bilaterally.  No adventitious sounds. CARDIOVASCULAR: S1 and S2. Regular rate and rhythm.  No rubs, murmurs or gallops heard. ABDOMEN: Benign. MUSCULOSKELETAL: No joint deformity, no clubbing, no edema.  Left arm examined no significant mass or raised lesion noted. NEUROLOGIC: No overt focal deficit, no gait disturbance, speech is fluent. SKIN: Intact,warm,dry.  No rashes noted. PSYCH: Mood and behavior normal.  Lab Results  Component Value Date   NITRICOXIDE 9 09/05/2023  *No evidence of type II inflammation present.       Assessment & Plan:     ICD-10-CM   1. Chronic cough  R05.3 Nitric oxide       Orders Placed This Encounter  Procedures   Nitric oxide     Meds ordered this encounter  Medications   Tiotropium  Bromide Monohydrate (SPIRIVA  RESPIMAT) 2.5 MCG/ACT AERS    Sig: Inhale 2 puffs into the lungs daily.    Dispense:  3 each    Refill:  0    Lot Number?:   [id number]    Expiration Date?:   09/08/2023    Quantity:   3   Discussion:    Chronic cough with postnasal drip and possible medication-induced component Chronic cough persisting for three and a half months, associated with postnasal drip. Exacerbated by certain positions and activities, such as lying on the left side and eating. Clear mucus production. No recent changes in heart medications, but Entresto , which he has been on for over a year, may contribute to cough exacerbation. No airway inflammation. Possible reflux component suggested by relief when lying on the left side. - Patient has pending ENT evaluation of postnasal drip. - Order pulmonary function tests to assess for potential airway issues. - Order blood tests for allergy panel through [facility]. - Advise avoiding mint or menthol-containing cough drops; suggest using original Ludens or Coca-Cola. - Provide samples of Spiriva  to assess response in controlling cough. - Schedule follow-up appointment in 4-6 weeks to reassess condition and review lab results.  Left arm muscle strain Recent development of a knot on the left arm, likely due to muscle strain from lying on it to avoid coughing. No indication of a blood clot.     Advised if symptoms do not improve or worsen,  to please contact office for sooner follow up or seek emergency care.    I spent 60 minutes of dedicated to the care of this patient on the date of this encounter to include pre-visit review of records, face-to-face time with the patient discussing conditions above, post visit ordering of testing, clinical documentation with the electronic health record, making appropriate referrals as documented, and communicating necessary findings to members of the patients care team.   C. Leita Sanders, MD [facility]    *This note was dictated using voice recognition software/Dragon.  Despite best efforts to proofread, errors can occur which can change the meaning. Any transcriptional errors that result from this process are unintentional and may not be fully corrected at the time of dictation.

## 2023-09-05 NOTE — Patient Instructions (Addendum)
 VISIT SUMMARY:  You came in today because of a chronic cough that has been bothering you for three and a half months. We discussed your symptoms, including the clear mucus and the relief you get when lying on your left side. We also talked about your history of heart issues and medications, as well as your recent weight loss and loss of taste.  YOUR PLAN:  -CHRONIC COUGH WITH POSTNASAL DRIP AND POSSIBLE MEDICATION-INDUCED COMPONENT: Your chronic cough may be related to postnasal drip or possibly a side effect of your medication, Entresto . We will refer you to an ENT specialist to evaluate the postnasal drip and order pulmonary function tests to see if you need an inhaler. We will also do blood tests to check for allergies. Avoid menthol-containing cough drops and try using Ludens or Coca-Cola instead. We are giving you samples of Spiriva , 2 puffs once a day to see if it helps control your cough.  -LEFT ARM MUSCLE STRAIN: The knot on your left arm is likely due to muscle strain from lying on it to avoid coughing. There is no indication of a blood clot.  INSTRUCTIONS:  Please follow up with the ENT specialist as referred. Complete the pulmonary function tests and blood tests for allergies as ordered. Avoid menthol-containing cough drops and use Ludens or Coca-Cola instead. Try the Spiriva  samples provided and see if they help with your cough. Schedule a follow-up appointment in 4-6 weeks to reassess your condition and review the lab results.

## 2023-09-07 DIAGNOSIS — M9902 Segmental and somatic dysfunction of thoracic region: Secondary | ICD-10-CM | POA: Diagnosis not present

## 2023-09-07 DIAGNOSIS — M461 Sacroiliitis, not elsewhere classified: Secondary | ICD-10-CM | POA: Diagnosis not present

## 2023-09-07 DIAGNOSIS — M9903 Segmental and somatic dysfunction of lumbar region: Secondary | ICD-10-CM | POA: Diagnosis not present

## 2023-09-07 DIAGNOSIS — M7918 Myalgia, other site: Secondary | ICD-10-CM | POA: Diagnosis not present

## 2023-09-07 DIAGNOSIS — M9905 Segmental and somatic dysfunction of pelvic region: Secondary | ICD-10-CM | POA: Diagnosis not present

## 2023-09-07 DIAGNOSIS — M4726 Other spondylosis with radiculopathy, lumbar region: Secondary | ICD-10-CM | POA: Diagnosis not present

## 2023-09-07 DIAGNOSIS — M9904 Segmental and somatic dysfunction of sacral region: Secondary | ICD-10-CM | POA: Diagnosis not present

## 2023-09-08 ENCOUNTER — Ambulatory Visit: Payer: Self-pay | Admitting: Pulmonary Disease

## 2023-09-08 ENCOUNTER — Telehealth: Payer: Self-pay | Admitting: Pharmacist Clinician (PhC)/ Clinical Pharmacy Specialist

## 2023-09-08 DIAGNOSIS — R053 Chronic cough: Secondary | ICD-10-CM | POA: Diagnosis not present

## 2023-09-08 DIAGNOSIS — R0982 Postnasal drip: Secondary | ICD-10-CM | POA: Diagnosis not present

## 2023-09-08 LAB — CBC WITH DIFFERENTIAL/PLATELET
Basophils Absolute: 0.1 x10E3/uL (ref 0.0–0.2)
Basos: 1 %
EOS (ABSOLUTE): 0.1 x10E3/uL (ref 0.0–0.4)
Eos: 2 %
Hematocrit: 34.7 % — ABNORMAL LOW (ref 37.5–51.0)
Hemoglobin: 11.4 g/dL — ABNORMAL LOW (ref 13.0–17.7)
Immature Grans (Abs): 0 x10E3/uL (ref 0.0–0.1)
Immature Granulocytes: 0 %
Lymphocytes Absolute: 1.4 x10E3/uL (ref 0.7–3.1)
Lymphs: 25 %
MCH: 31.7 pg (ref 26.6–33.0)
MCHC: 32.9 g/dL (ref 31.5–35.7)
MCV: 96 fL (ref 79–97)
Monocytes Absolute: 0.5 x10E3/uL (ref 0.1–0.9)
Monocytes: 8 %
Neutrophils Absolute: 3.7 x10E3/uL (ref 1.4–7.0)
Neutrophils: 64 %
Platelets: 260 x10E3/uL (ref 150–450)
RBC: 3.6 x10E6/uL — ABNORMAL LOW (ref 4.14–5.80)
RDW: 11.9 % (ref 11.6–15.4)
WBC: 5.8 x10E3/uL (ref 3.4–10.8)

## 2023-09-08 LAB — ALLERGEN PANEL (27) + IGE
Alternaria Alternata IgE: <0.1 kU/L
Aspergillus Fumigatus IgE: <0.1 kU/L
Bahia Grass IgE: <0.1 kU/L
Bermuda Grass IgE: <0.1 kU/L
Cat Dander IgE: <0.1 kU/L
Cedar, Mountain IgE: <0.1 kU/L
Cladosporium Herbarum IgE: <0.1 kU/L
Cocklebur IgE: <0.1 kU/L
Cockroach, American IgE: 0.28 kU/L — AB
Common Silver Birch IgE: <0.1 kU/L
D Farinae IgE: 0.25 kU/L — AB
D Pteronyssinus IgE: 0.22 kU/L — AB
Dog Dander IgE: <0.1 kU/L
Elm, American IgE: <0.1 kU/L
Hickory, White IgE: <0.1 kU/L
IgE (Immunoglobulin E), Serum: 77 [IU]/mL (ref 6–495)
Johnson Grass IgE: <0.1 kU/L
Kentucky Bluegrass IgE: <0.1 kU/L
Maple/Box Elder IgE: <0.1 kU/L
Mucor Racemosus IgE: <0.1 kU/L
Oak, White IgE: <0.1 kU/L
Penicillium Chrysogen IgE: <0.1 kU/L
Pigweed, Rough IgE: <0.1 kU/L
Plantain, English IgE: <0.1 kU/L
Ragweed, Short IgE: <0.1 kU/L
Setomelanomma Rostrat: <0.1 kU/L
Timothy Grass IgE: <0.1 kU/L
White Mulberry IgE: <0.1 kU/L

## 2023-09-08 NOTE — Telephone Encounter (Signed)
 Patient was in the office today with wife - appointment for her.  Family noted that he has been coughing for > 3 months and they have been to multiple MD's trying to determine cause.  Descriptions from family and sound of cough don't necessarily align with ACE cough.  He is seeing pulmonology today.  Advised that if they don't make any changes, that he hold Entresto  for 3 days to see if cough lessens.  If pulmonology makes changes, wait at least a week to determine changes to cough before trying to hold Entresto  for 3 days.   Patient agreeable with plan, daughter will send a MyChart message after this trial, to let me know how he did.

## 2023-09-09 ENCOUNTER — Encounter: Payer: Self-pay | Admitting: Pulmonary Disease

## 2023-09-11 DIAGNOSIS — M9905 Segmental and somatic dysfunction of pelvic region: Secondary | ICD-10-CM | POA: Diagnosis not present

## 2023-09-11 DIAGNOSIS — M461 Sacroiliitis, not elsewhere classified: Secondary | ICD-10-CM | POA: Diagnosis not present

## 2023-09-11 DIAGNOSIS — M9902 Segmental and somatic dysfunction of thoracic region: Secondary | ICD-10-CM | POA: Diagnosis not present

## 2023-09-11 DIAGNOSIS — M9904 Segmental and somatic dysfunction of sacral region: Secondary | ICD-10-CM | POA: Diagnosis not present

## 2023-09-11 DIAGNOSIS — M9903 Segmental and somatic dysfunction of lumbar region: Secondary | ICD-10-CM | POA: Diagnosis not present

## 2023-09-11 DIAGNOSIS — M7918 Myalgia, other site: Secondary | ICD-10-CM | POA: Diagnosis not present

## 2023-09-11 DIAGNOSIS — M4726 Other spondylosis with radiculopathy, lumbar region: Secondary | ICD-10-CM | POA: Diagnosis not present

## 2023-09-12 DIAGNOSIS — E559 Vitamin D deficiency, unspecified: Secondary | ICD-10-CM | POA: Diagnosis not present

## 2023-09-12 DIAGNOSIS — Z9889 Other specified postprocedural states: Secondary | ICD-10-CM | POA: Diagnosis not present

## 2023-09-12 DIAGNOSIS — Z79899 Other long term (current) drug therapy: Secondary | ICD-10-CM | POA: Diagnosis not present

## 2023-09-12 DIAGNOSIS — E785 Hyperlipidemia, unspecified: Secondary | ICD-10-CM | POA: Diagnosis not present

## 2023-09-12 DIAGNOSIS — E1159 Type 2 diabetes mellitus with other circulatory complications: Secondary | ICD-10-CM | POA: Diagnosis not present

## 2023-09-12 NOTE — Telephone Encounter (Signed)
 Patient called back to say the cough is still going on, would like to see what other options are there. Please advise

## 2023-09-12 NOTE — Telephone Encounter (Signed)
 Patient called to report that cough lessened after 3 days off Entresto , but he restarted yesterday and cough was worse today.   Advised that he stop the Entresto  completely for now.  He is scheduled to see me on Aug 29, so will review options depending on cough at that time.  He notes that Praluent  also lists side effect of cough, but advised that is usually a transient side effect and he should continue for now.    Patient agreeable to plan

## 2023-09-18 NOTE — Telephone Encounter (Signed)
 Patient is following up. He says stopping Entresto  Friday helped tremendously. He says he barely coughed at all Friday. However, Saturday, Sunday, and today, after taking Praluent  injection he has been coughing all day. Please advise.

## 2023-09-19 DIAGNOSIS — R1310 Dysphagia, unspecified: Secondary | ICD-10-CM | POA: Diagnosis not present

## 2023-09-19 DIAGNOSIS — Z125 Encounter for screening for malignant neoplasm of prostate: Secondary | ICD-10-CM | POA: Diagnosis not present

## 2023-09-19 DIAGNOSIS — I5022 Chronic systolic (congestive) heart failure: Secondary | ICD-10-CM | POA: Diagnosis not present

## 2023-09-19 DIAGNOSIS — E559 Vitamin D deficiency, unspecified: Secondary | ICD-10-CM | POA: Diagnosis not present

## 2023-09-19 DIAGNOSIS — R946 Abnormal results of thyroid function studies: Secondary | ICD-10-CM | POA: Diagnosis not present

## 2023-09-19 DIAGNOSIS — R059 Cough, unspecified: Secondary | ICD-10-CM | POA: Diagnosis not present

## 2023-09-19 DIAGNOSIS — D649 Anemia, unspecified: Secondary | ICD-10-CM | POA: Diagnosis not present

## 2023-09-19 DIAGNOSIS — E1159 Type 2 diabetes mellitus with other circulatory complications: Secondary | ICD-10-CM | POA: Diagnosis not present

## 2023-09-19 DIAGNOSIS — I429 Cardiomyopathy, unspecified: Secondary | ICD-10-CM | POA: Diagnosis not present

## 2023-09-19 DIAGNOSIS — Z9889 Other specified postprocedural states: Secondary | ICD-10-CM | POA: Diagnosis not present

## 2023-09-20 DIAGNOSIS — M9903 Segmental and somatic dysfunction of lumbar region: Secondary | ICD-10-CM | POA: Diagnosis not present

## 2023-09-20 DIAGNOSIS — M7918 Myalgia, other site: Secondary | ICD-10-CM | POA: Diagnosis not present

## 2023-09-20 DIAGNOSIS — M4726 Other spondylosis with radiculopathy, lumbar region: Secondary | ICD-10-CM | POA: Diagnosis not present

## 2023-09-20 DIAGNOSIS — M9904 Segmental and somatic dysfunction of sacral region: Secondary | ICD-10-CM | POA: Diagnosis not present

## 2023-09-20 DIAGNOSIS — M9902 Segmental and somatic dysfunction of thoracic region: Secondary | ICD-10-CM | POA: Diagnosis not present

## 2023-09-20 DIAGNOSIS — M9905 Segmental and somatic dysfunction of pelvic region: Secondary | ICD-10-CM | POA: Diagnosis not present

## 2023-09-20 DIAGNOSIS — M461 Sacroiliitis, not elsewhere classified: Secondary | ICD-10-CM | POA: Diagnosis not present

## 2023-09-25 NOTE — Telephone Encounter (Signed)
 Spoke with patient and wife.  He notes cough after stopping Entresto  improved to only once every 2-3 hours, but with Praluent  increased significantly.  Took Praluent  Saturday 8/9 and states cough increased.  Now 9 days later says still not back to that improved level prior to cough.  Also notes that energy levels have dropped noticeably since stopping Entresto .    Advised to restart Entresto  with 1/2 tablet bid until he sees me on the 29 th.  Hold Praluent  until then.  Patient and wife voiced understanding.    Also note, patient has appointment for esophageal dilation in October.

## 2023-09-27 ENCOUNTER — Telehealth: Payer: Self-pay | Admitting: Cardiovascular Disease

## 2023-09-27 DIAGNOSIS — M4726 Other spondylosis with radiculopathy, lumbar region: Secondary | ICD-10-CM | POA: Diagnosis not present

## 2023-09-27 DIAGNOSIS — M9903 Segmental and somatic dysfunction of lumbar region: Secondary | ICD-10-CM | POA: Diagnosis not present

## 2023-09-27 DIAGNOSIS — M7918 Myalgia, other site: Secondary | ICD-10-CM | POA: Diagnosis not present

## 2023-09-27 DIAGNOSIS — M461 Sacroiliitis, not elsewhere classified: Secondary | ICD-10-CM | POA: Diagnosis not present

## 2023-09-27 DIAGNOSIS — M9904 Segmental and somatic dysfunction of sacral region: Secondary | ICD-10-CM | POA: Diagnosis not present

## 2023-09-27 DIAGNOSIS — M9902 Segmental and somatic dysfunction of thoracic region: Secondary | ICD-10-CM | POA: Diagnosis not present

## 2023-09-27 DIAGNOSIS — M9905 Segmental and somatic dysfunction of pelvic region: Secondary | ICD-10-CM | POA: Diagnosis not present

## 2023-09-27 NOTE — Telephone Encounter (Signed)
 Patient states he resumed taking Entresto  1/2 tablet BID as instructed by Pharm D and the coughing and drainage he was experiencing has returned. He would like to know if he should continue as discussed or if there is another option.  Will forward to Kristin Alvstad, RPH-CPP to review and advise.

## 2023-09-27 NOTE — Telephone Encounter (Signed)
 Pt c/o medication issue:  1. Name of Medication: sacubitril -valsartan  (ENTRESTO ) 97-103 MG   2. How are you currently taking this medication (dosage and times per day)? Take 1 tablet by mouth 2 (two) times daily.Patient not taking: Reported on 09/12/2023   3. Are you having a reaction (difficulty breathing--STAT)? No  4. What is your medication issue? Patient is calling because he started the medication on Monday and has been having trouble with coughing and drainage. Patient stated as soon as he started the medication he immediately noticed the coughing and drainage. Patient would like to know if he should continue taking the medication. Please advise/

## 2023-09-27 NOTE — Telephone Encounter (Signed)
 error

## 2023-09-29 NOTE — Telephone Encounter (Signed)
 Alvstad, Kristin L, RPH-CPP to Vicci Roxie CROME, RN     09/29/23 11:55 AM Please ask patient to stop medication and keep his appointment with me for next week.  Informed the patient of the information above. He verbalized understanding and will be at the appt next week 8/29

## 2023-10-02 ENCOUNTER — Encounter (HOSPITAL_BASED_OUTPATIENT_CLINIC_OR_DEPARTMENT_OTHER): Payer: Self-pay

## 2023-10-02 ENCOUNTER — Ambulatory Visit (HOSPITAL_BASED_OUTPATIENT_CLINIC_OR_DEPARTMENT_OTHER): Admission: EM | Admit: 2023-10-02 | Discharge: 2023-10-02 | Disposition: A | Discharge status: Home / Self Care

## 2023-10-02 DIAGNOSIS — M25522 Pain in left elbow: Secondary | ICD-10-CM

## 2023-10-02 NOTE — ED Provider Notes (Signed)
 [facility]    CSN: [id number] Arrival date & time: 10/02/23  1255      History   Chief Complaint Chief Complaint  Patient presents with   Arm Pain    HPI David Sellers is a 83 y.o. male.   Pt is an 83 year old male that presents with bilateral arm pain when waking up in the am. Needed assistance getting out of bed today due to the pain.This has been ongoing for some time. Hx of arthritis in the neck. No recent injections. Denies recent injuries. States feels a lump to the upper part of the left arm. Patient states has asked pcp about this and was told it was probably arthritis. States arm pain severe in the morning but improves after he gets up and moves around. Takes 2 tylenol  arthritis bid. No chest pain, SOB.    Arm Pain    Past Medical History:  Diagnosis Date   Arthritis    Asthma    only has flareup with smelly perfume    BPH (benign prostatic hyperplasia)    Carotid artery occlusion    right ICA 50% stenosis   Carotodynia    Cataract    immature to the right eye   CHF (congestive heart failure) (HCC)    Coronary artery disease    Diabetes mellitus without complication (HCC)    borderline   GERD (gastroesophageal reflux disease)    takes Omeprazole daily   Headache(784.0)    r/t arthritis in neck    Hemorrhoids    History of bronchitis    last time 10-24yrs ago   History of colon polyps    Hyperlipidemia    no meds since Jan 1   Hypertension    takes Verapamil ,Lisinopril ,and Cardura  daily   Joint pain    Legally blind in left eye, as defined in [place]     hit by a baseball   Low back pain    reason unknown   Pneumonia    3 times in a row   Prolapsed hemorrhoids    Urinary frequency    sees Dr.Tannenbaum    Patient Active Problem List   Diagnosis Date Noted   Type 2 diabetes mellitus with complication, without long-term current use of insulin  (HCC) 03/30/2022   NSTEMI (non-ST elevated myocardial infarction) (HCC) 03/28/2022   Coronary  artery disease 06/15/2021   Right inguinal hernia 11/08/2020   Acute on chronic heart failure (HCC) 09/09/2020   CHF (congestive heart failure) (HCC) 09/08/2020   Acute on chronic systolic (congestive) heart failure (HCC) 09/08/2020   HFrEF (heart failure with reduced ejection fraction) (HCC) 08/03/2020   Prolapsed internal hemorrhoids, grade 4 08/12/2019   Carotid stenosis 12/05/2013   Carotid artery stenosis 05/08/2013   Carotid artery disease (HCC) 04/17/2013   Essential hypertension 04/17/2013   Hyperlipidemia 04/17/2013   Family history of coronary artery disease 04/17/2013   Occlusion and stenosis of carotid artery without mention of cerebral infarction 04/11/2013    Past Surgical History:  Procedure Laterality Date   CAROTID ENDARTERECTOMY Left 05/08/13   COLONOSCOPY     EGD with dilitation     ENDARTERECTOMY Left 05/08/2013   Procedure: LEFT CAROTID ARTERY ENDARTERECTOMY WITH DACRON PATCH ANGIOPLASTY;  Surgeon: Carlin FORBES Haddock, MD;  Location: [facility] OR;  Service: Vascular;  Laterality: Left;   EYE SURGERY     HEMORRHOID SURGERY N/A 08/13/2019   Procedure: OPEN HEMORRHOIDECTOMY;  Surgeon: Eletha Boas, MD;  Location: [facility];  Service:  General;  Laterality: N/A;  LMA   HERNIA REPAIR     x 2   INGUINAL HERNIA REPAIR Right 11/09/2020   Procedure: OPEN REPAIR RIGHT INGUINAL HERNIA;  Surgeon: Eletha Boas, MD;  Location: [facility] OR;  Service: General;  Laterality: Right;   INSERTION OF MESH Right 11/09/2020   Procedure: INSERTION OF MESH;  Surgeon: Eletha Boas, MD;  Location: [facility] OR;  Service: General;  Laterality: Right;   RETINAL DETACHMENT SURGERY     retinal laser surgery Left    plate placed   RIGHT/LEFT HEART CATH AND CORONARY ANGIOGRAPHY N/A 09/08/2020   Procedure: RIGHT/LEFT HEART CATH AND CORONARY ANGIOGRAPHY;  Surgeon: Rolan Ezra RAMAN, MD;  Location: [facility] INVASIVE CV LAB;  Service: Cardiovascular;  Laterality: N/A;       Home Medications    Prior to Admission  medications   Medication Sig Start Date End Date Taking? Authorizing Provider  Acetaminophen  (TYLENOL  ARTHRITIS PAIN PO) Take 650 mg by mouth as needed.    [provider]  Alirocumab  (PRALUENT ) 75 MG/ML SOAJ INJECT 1ML SUBCUTANEOUSLY EVERY 14 DAYS 11/01/22   Court Dorn PARAS, MD  aspirin  EC 81 MG tablet Take 81 mg by mouth every morning.    [provider]  Carboxymethylcellulose Sodium (EYE DROPS OP) Apply to eye as needed (Dry eyes). Thera tears    [provider]  carvedilol  (COREG ) 25 MG tablet TAKE 1 TABLET (25 MG TOTAL) BY MOUTH TWICE A DAY WITH MEALS 09/05/23   Court Dorn PARAS, MD  cetirizine (ZYRTEC) 10 MG tablet Take 10 mg by mouth 2 (two) times daily. 05/18/21   [provider]  Chlorpheniramine Maleate (CHLOR-TRIMETON ALLERGY PO) Take by mouth as needed (Allergies).    [provider]  Cholecalciferol (VITAMIN D-3) 125 MCG (5000 UT) TABS Take 5,000 Units by mouth daily.    [provider]  clopidogrel  (PLAVIX ) 75 MG tablet TAKE 1 TABLET BY MOUTH EVERY DAY 06/09/23   Court Dorn PARAS, MD  doxazosin  (CARDURA ) 8 MG tablet TAKE 1 TABLET BY MOUTH EVERY DAY 06/01/23   Court Dorn PARAS, MD  isosorbide  mononitrate (IMDUR ) 60 MG 24 hr tablet TAKE 1 TABLET BY MOUTH EVERY DAY 12/05/22   Court Dorn PARAS, MD  Krill Oil 500 MG CAPS Take 500 mg by mouth at bedtime.    [provider]  metFORMIN  (GLUCOPHAGE -XR) 500 MG 24 hr tablet Take 1 tablet (500 mg total) by mouth 2 (two) times daily with breakfast and lunch. 09/11/20   Marcine Catalan M, [name]  nitroGLYCERIN  (NITROSTAT ) 0.4 MG SL tablet Place 1 tablet (0.4 mg total) under the tongue every 5 (five) minutes x 3 doses as needed for chest pain. 10/21/22   Court Dorn PARAS, MD  [facility] VERIO test strip 2 (two) times daily. 10/07/19   [provider]  pantoprazole  (PROTONIX ) 40 MG tablet TAKE 1 TABLET BY MOUTH EVERY DAY 02/06/23   Court Dorn PARAS, MD  sacubitril -valsartan   (ENTRESTO ) 97-103 MG Take 1 tablet by mouth 2 (two) times daily. Patient not taking: Reported on 09/12/2023 01/25/23   Court Dorn PARAS, MD  spironolactone  (ALDACTONE ) 25 MG tablet Take 1 tablet (25 mg total) by mouth daily. 02/06/23   Court Dorn PARAS, MD  Tiotropium Bromide Monohydrate  (SPIRIVA  RESPIMAT) 2.5 MCG/ACT AERS Inhale 2 puffs into the lungs daily. 09/05/23   Tamea Dedra CROME, MD    Family History Family History  Adopted: Yes  Problem Relation Age of Onset   Heart disease Father  Heart attack Father 57   Hypertension Father    Hyperlipidemia Father    Diabetes Daughter    Hyperlipidemia Daughter    Hypertension Daughter    Diabetes Daughter    Hyperlipidemia Daughter    Hypertension Daughter    Kidney disease Mother     Social History Social History   Tobacco Use   Smoking status: Never   Smokeless tobacco: Never  Vaping Use   Vaping status: Never Used  Substance Use Topics   Alcohol  use: No   Drug use: No     Allergies   Albuterol , Meloxicam, Morphine  and codeine, Statins, Cholestoff [plant sterols and stanols], Nsaids, Breo ellipta  [fluticasone furoate-vilanterol], Cortisone, Darvon [propoxyphene], Doxycycline, Emetrol, Glimepiride, Hydrochlorothiazide, Indomethacin, Jardiance  [empagliflozin ], Naproxen, Penicillins, Repatha  [evolocumab ], Sulfa antibiotics, Terbinafine, and Tramadol    Review of Systems Review of Systems  See HPI Physical Exam Triage Vital Signs ED Triage Vitals  Encounter Vitals Group     BP 10/02/23 1306 (!) 214/88     Girls Systolic BP Percentile --      Girls Diastolic BP Percentile --      Boys Systolic BP Percentile --      Boys Diastolic BP Percentile --      Pulse Rate 10/02/23 1306 71     Resp 10/02/23 1306 20     Temp 10/02/23 1306 97.8 F (36.6 C)     Temp Source 10/02/23 1306 Oral     SpO2 10/02/23 1306 98 %     Weight --      Height --      Head Circumference --      Peak Flow --      Pain Score 10/02/23 1309  1     Pain Loc --      Pain Education --      Exclude from Growth Chart --    No data found.  Updated Vital Signs BP (!) 214/88 (BP Location: Left Arm) Comment: Reports this is common when visiting physician.  Pulse 71   Temp 97.8 F (36.6 C) (Oral)   Resp 20   SpO2 98%   Visual Acuity Right Eye Distance:   Left Eye Distance:   Bilateral Distance:    Right Eye Near:   Left Eye Near:    Bilateral Near:     Physical Exam Constitutional:      Appearance: Normal appearance.  Pulmonary:     Effort: Pulmonary effort is normal.  Musculoskeletal:        General: Normal range of motion.     Comments: Pain with ROM both arms, mostly in left shoulder. No swelling.   Skin:    General: Skin is warm and dry.  Neurological:     Mental Status: He is alert.  Psychiatric:        Mood and Affect: Mood normal.      UC Treatments / Results  Labs (all labs ordered are listed, but only abnormal results are displayed) Labs Reviewed - No data to display  EKG   Radiology No results found.  Procedures Procedures (including critical care time)  Medications Ordered in UC Medications - No data to display  Initial Impression / Assessment and Plan / UC Course  I have reviewed the triage vital signs and the nursing notes.  Pertinent labs & imaging results that were available during my care of the patient were reviewed by me and considered in my medical decision making (see chart for details).     Arthralgia  of bilateral arms more on the left-this is a chronic issue.  He wakes up every morning like this but as a day goes on the pain improves.  Has been told arthritis in the past.  Do agree with this.  This is medical likely the cause. Also spoke about potential side effect to medication.  He has many allergies and side effects to medications.  I am not seeing anything concerning or any red flags today.  He has follow-up with his doctor on Friday.  Recommend follow-up as  planned. Final Clinical Impressions(s) / UC Diagnoses   Final diagnoses:  Arthralgia of left upper arm     Discharge Instructions      I believe that the pain could be related to arthritis or something going on with her shoulder.  This also could potentially be a side effect of the medication Recommend follow-up with your doctor on Friday as planned for discussion of this and referral if needed   ED Prescriptions   None    PDMP not reviewed this encounter.   Adah Wilbert LABOR, FNP 10/03/23 [phone]

## 2023-10-02 NOTE — Discharge Instructions (Signed)
 I believe that the pain could be related to arthritis or something going on with her shoulder.  This also could potentially be a side effect of the medication Recommend follow-up with your doctor on Friday as planned for discussion of this and referral if needed

## 2023-10-02 NOTE — ED Triage Notes (Signed)
 Bilateral arm pain when waking up this morning. Needed assistance getting out of bed. No recent injections. Denies recent injuries. States feels a lump to the upper part of the left arm. Patient states has asked pcp about this and was told it was probably arthritis. States arm pain severe in the morning. Takes 2 tylenol  arthritis bid.

## 2023-10-04 DIAGNOSIS — M9904 Segmental and somatic dysfunction of sacral region: Secondary | ICD-10-CM | POA: Diagnosis not present

## 2023-10-04 DIAGNOSIS — M461 Sacroiliitis, not elsewhere classified: Secondary | ICD-10-CM | POA: Diagnosis not present

## 2023-10-04 DIAGNOSIS — M9903 Segmental and somatic dysfunction of lumbar region: Secondary | ICD-10-CM | POA: Diagnosis not present

## 2023-10-04 DIAGNOSIS — M9902 Segmental and somatic dysfunction of thoracic region: Secondary | ICD-10-CM | POA: Diagnosis not present

## 2023-10-04 DIAGNOSIS — M9905 Segmental and somatic dysfunction of pelvic region: Secondary | ICD-10-CM | POA: Diagnosis not present

## 2023-10-04 DIAGNOSIS — M7918 Myalgia, other site: Secondary | ICD-10-CM | POA: Diagnosis not present

## 2023-10-04 DIAGNOSIS — M4726 Other spondylosis with radiculopathy, lumbar region: Secondary | ICD-10-CM | POA: Diagnosis not present

## 2023-10-06 ENCOUNTER — Ambulatory Visit: Attending: Cardiology | Admitting: Pharmacist Clinician (PhC)/ Clinical Pharmacy Specialist

## 2023-10-06 ENCOUNTER — Encounter: Payer: Self-pay | Admitting: Pharmacist Clinician (PhC)/ Clinical Pharmacy Specialist

## 2023-10-06 VITALS — BP 170/90 | HR 56

## 2023-10-06 DIAGNOSIS — I509 Heart failure, unspecified: Secondary | ICD-10-CM | POA: Diagnosis not present

## 2023-10-06 MED ORDER — VALSARTAN 40 MG PO TABS: 40.0000 mg | ORAL_TABLET | Freq: Every day | ORAL | 6 refills | Status: DC

## 2023-10-06 NOTE — Patient Instructions (Signed)
 Follow up appointment: with Damien Braver on Oct 16 at 10:30 am  Take your BP meds as follows:  Start valsartan  40 mg once daily at night  Check your blood pressure at home daily and keep record of the readings.  Your blood pressure goal is < 130/80  To check your pressure at home you will need to:  1. Sit up in a chair, with feet flat on the floor and back supported. Do not cross your ankles or legs. 2. Rest your left arm so that the cuff is about heart level. If the cuff goes on your upper arm,  then just relax the arm on the table, arm of the chair or your lap. If you have a wrist cuff, we  suggest relaxing your wrist against your chest (think of it as Pledging the Flag with the  wrong arm).  3. Place the cuff snugly around your arm, about 1 inch above the crook of your elbow. The  cords should be inside the groove of your elbow.  4. Sit quietly, with the cuff in place, for about 5 minutes. After that 5 minutes press the power  button to start a reading. 5. Do not talk or move while the reading is taking place.  6. Record your readings on a sheet of paper. Although most cuffs have a memory, it is often  easier to see a pattern developing when the numbers are all in front of you.  7. You can repeat the reading after 1-3 minutes if it is recommended  Make sure your bladder is empty and you have not had caffeine or tobacco within the last 30 min  Always bring your blood pressure log with you to your appointments. If you have not brought your monitor in to be double checked for accuracy, please bring it to your next appointment.  You can find a list of quality blood pressure cuffs at [email or web address]  Important lifestyle changes to control high blood pressure  Intervention  Effect on the BP  Lose extra pounds and watch your waistline Weight loss is one of the most effective lifestyle changes for controlling blood pressure. If you're overweight or obese, losing even a small amount of  weight can help reduce blood pressure. Blood pressure might go down by about 1 millimeter of mercury (mm Hg) with each kilogram (about 2.2 pounds) of weight lost.  Exercise regularly As a general goal, aim for at least 30 minutes of moderate physical activity every day. Regular physical activity can lower high blood pressure by about 5 to 8 mm Hg.  Eat a healthy diet Eating a diet rich in whole grains, fruits, vegetables, and low-fat dairy products and low in saturated fat and cholesterol. A healthy diet can lower high blood pressure by up to 11 mm Hg.  Reduce salt (sodium) in your diet Even a small reduction of sodium in the diet can improve heart health and reduce high blood pressure by about 5 to 6 mm Hg.  Limit alcohol  One drink equals 12 ounces of beer, 5 ounces of wine, or 1.5 ounces of 80-proof liquor.  Limiting alcohol  to less than one drink a day for women or two drinks a day for men can help lower blood pressure by about 4 mm Hg.   If you have any questions or concerns please use My Chart to send questions or call the office at [phone]

## 2023-10-06 NOTE — Progress Notes (Signed)
 Office Visit    Patient Name: David Sellers Date of Encounter: 10/06/2023  Primary Care Provider:  Gerome Brunet, DO Primary Cardiologist:  Dorn Lesches, MD  Chief Complaint    Heart Failure Medication Titration - EF 35-40% (by echo 03/2022)  Significant Past Medical History   HTN Controlled with HF medications  HLD Stopped Praluent  for now 2/2 cough worsening  CAD Internal carotid stenosis, endarterectomy 2015; NSTEMI 2/24  DM2 8/25 A1c 8.3 on metformin   cough Appears chronic, will see GI in October for swallow concerns    Allergies  Allergen Reactions   Albuterol  Anaphylaxis    Per patient and daughter   Meloxicam Itching, Palpitations and Other (See Comments)    Headaches, Mood swings.   Morphine  And Codeine Nausea And Vomiting   Statins Other (See Comments)    Myalgias. Severe arthritic response. Tolerates Lovastatin.    Cholestoff [Plant Sterols And Stanols] Other (See Comments)    Muscle aches   Nsaids Other (See Comments)    Aleve ,  Myalgia   Breo Ellipta  [Fluticasone Furoate-Vilanterol]     thrush   Cortisone Hypertension    Patient states cortisone injections he has received for his knees cause hypertension and hyperglycemia   Darvon [Propoxyphene] Other (See Comments)    hallucinate   Doxycycline Cough   Emetrol     Other reaction(s): Hallucinate   Glimepiride Itching   Hydrochlorothiazide     Other reaction(s): Low Blood Pressure   Indomethacin     Neck pain flare   Jardiance  [Empagliflozin ]     hallucinations   Naproxen     Other reaction(s): Increased Liver Count   Penicillins     *positive allergy test*   Repatha  [Evolocumab ] Other (See Comments)    hyperglycemia   Sulfa Antibiotics Nausea And Vomiting   Terbinafine Diarrhea   Tramadol  Other (See Comments)    Hallucinations     History of Present Illness    David Sellers is a 83 y.o. male patient of Dr Lesches, in the office today for heart failure medication titration.  We have been  working via [employer] over the past month or so, he was reporting cough that worsened with Entresto  and Praluent .  When stopped both cough was improved but still present.  We re-challenged with a lower dose of Entresto , but he reported that the cough worsened again.  I asked him to stop it about a week ago and just wait until his appointment.    Today he is in the office with his wife, whom I am also seeing.  His home BP readings have been good overall, and he reports that the cough tends to worsen with meals, although it isn't as bad as when he was on Entresto  and Praluent .  He has an appointment with GI in October and does report having had EGD in the past.  His last dose of Praluent  was 4 weeks ago.    Blood Pressure Goal:  130/80  GDMT: ACEI/ARB/ARNI [] Yes [] No Not currently, cough with entresto   Beta blocker [x] Yes [] No Carvedilol  25 mg bid  MRA [x] Yes [] No Spironolactone  25 mg daily  SGLT2 inhibitor [] Yes [x] No Empagliflozin  caused hallucinations    Previously tried:  entresto  - cough   Empagliflozin  - hallucinations  Family Hx:   father had MI at 80, mother with CKD, 2 daughters both with DM, HTN, HLD  Social Hx:      Tobacco: no  Alcohol : no  Caffeine: some coffee,  Diet:  does eat out some, appetite has decreased overall    Home BP readings: 25 home readings over past month, average 128/66  HR 63  (range 98-176/55-78)      Accessory Clinical Findings    Lab Results  Component Value Date   CREATININE 1.06 03/30/2022   BUN 12 03/30/2022   NA 137 03/30/2022   K 3.6 03/30/2022   CL 106 03/30/2022   CO2 23 03/30/2022   Lab Results  Component Value Date   ALT 15 03/27/2022   AST 21 03/27/2022   ALKPHOS 32 (L) 03/27/2022   BILITOT 0.9 03/27/2022   No results found for: HGBA1C  Home Medications/Allergies    Current Outpatient Medications  Medication Sig Dispense Refill   Acetaminophen  (TYLENOL  ARTHRITIS PAIN PO) Take 650 mg by mouth as needed.     aspirin  EC  81 MG tablet Take 81 mg by mouth every morning.     Carboxymethylcellulose Sodium (EYE DROPS OP) Apply to eye as needed (Dry eyes). Thera tears     carvedilol  (COREG ) 25 MG tablet TAKE 1 TABLET (25 MG TOTAL) BY MOUTH TWICE A DAY WITH MEALS 180 tablet 3   cetirizine (ZYRTEC) 10 MG tablet Take 10 mg by mouth 2 (two) times daily.     Chlorpheniramine Maleate (CHLOR-TRIMETON ALLERGY PO) Take by mouth as needed (Allergies).     Cholecalciferol (VITAMIN D-3) 125 MCG (5000 UT) TABS Take 5,000 Units by mouth daily.     clopidogrel  (PLAVIX ) 75 MG tablet TAKE 1 TABLET BY MOUTH EVERY DAY 90 tablet 3   doxazosin  (CARDURA ) 8 MG tablet TAKE 1 TABLET BY MOUTH EVERY DAY 90 tablet 3   isosorbide  mononitrate (IMDUR ) 60 MG 24 hr tablet TAKE 1 TABLET BY MOUTH EVERY DAY 90 tablet 3   Krill Oil 500 MG CAPS Take 500 mg by mouth at bedtime.     metFORMIN  (GLUCOPHAGE -XR) 500 MG 24 hr tablet Take 1 tablet (500 mg total) by mouth 2 (two) times daily with breakfast and lunch.     nitroGLYCERIN  (NITROSTAT ) 0.4 MG SL tablet Place 1 tablet (0.4 mg total) under the tongue every 5 (five) minutes x 3 doses as needed for chest pain. 75 tablet 2   ONETOUCH VERIO test strip 2 (two) times daily.     spironolactone  (ALDACTONE ) 25 MG tablet Take 1 tablet (25 mg total) by mouth daily. 90 tablet 3   Tiotropium Bromide Monohydrate  (SPIRIVA  RESPIMAT) 2.5 MCG/ACT AERS Inhale 2 puffs into the lungs daily. 3 each 0   valsartan  (DIOVAN ) 40 MG tablet Take 1 tablet (40 mg total) by mouth daily. 30 tablet 6   Alirocumab  (PRALUENT ) 75 MG/ML SOAJ INJECT 1ML SUBCUTANEOUSLY EVERY 14 DAYS (Patient not taking: Reported on 10/06/2023) 6 mL 3   pantoprazole  (PROTONIX ) 40 MG tablet TAKE 1 TABLET BY MOUTH EVERY DAY 90 tablet 3   No current facility-administered medications for this visit.     Allergies  Allergen Reactions   Albuterol  Anaphylaxis    Per patient and daughter   Meloxicam Itching, Palpitations and Other (See Comments)    Headaches, Mood  swings.   Morphine  And Codeine Nausea And Vomiting   Statins Other (See Comments)    Myalgias. Severe arthritic response. Tolerates Lovastatin.    Cholestoff [Plant Sterols And Stanols] Other (See Comments)    Muscle aches   Nsaids Other (See Comments)    Aleve ,  Myalgia   Breo Ellipta  [Fluticasone Furoate-Vilanterol]     thrush   Cortisone Hypertension  Patient states cortisone injections he has received for his knees cause hypertension and hyperglycemia   Darvon [Propoxyphene] Other (See Comments)    hallucinate   Doxycycline Cough   Emetrol     Other reaction(s): Hallucinate   Glimepiride Itching   Hydrochlorothiazide     Other reaction(s): Low Blood Pressure   Indomethacin     Neck pain flare   Jardiance  [Empagliflozin ]     hallucinations   Naproxen     Other reaction(s): Increased Liver Count   Penicillins     *positive allergy test*   Repatha  [Evolocumab ] Other (See Comments)    hyperglycemia   Sulfa Antibiotics Nausea And Vomiting   Terbinafine Diarrhea   Tramadol  Other (See Comments)    Hallucinations        Assessment & Plan   HYPERTENSION CONTROL Vitals:   10/06/23 0948 10/06/23 0959  BP: (!) 180/86 (!) 170/90    The patient's blood pressure is elevated above target today.  In order to address the patient's elevated BP: A new medication was prescribed today.     CHF (congestive heart failure) (HCC) Assessment: BP in office today is 180/86 (white coat htn, home average 128/65) Patient with HFrEF  D/C Entresto  for now, secondary to worsening cough Has appointment with GI in October to address ongoing cough - pt reports worsening with meals Tolerates current medications without any side effects Denies SOB, palpitation, chest pain, headaches,or edema No concerns with regards to cost of medications, compliance, ability to pick up at pharmacy Reiterated the importance of regular exercise and low salt diet  Plan: GDMT ACEI/ARB/ARNI Valsartan  40  mg daily Entresto  worsened cough, may re-trial after GI evaluation  Beta blocker Carvedilol  25 mg bid   MRA Spironolactone  25 mg daily   SGLT2  Empagliflozin  caused hallucinations   Labs orderd:  none Follow up with Damien Braver in 6 weeks    Allean Mink PharmD CPP [facility]  [address] [place], [place] [address] [phone]

## 2023-10-06 NOTE — Assessment & Plan Note (Signed)
 Assessment: BP in office today is 180/86 (white coat htn, home average 128/65) Patient with HFrEF  D/C Entresto  for now, secondary to worsening cough Has appointment with GI in October to address ongoing cough - pt reports worsening with meals Tolerates current medications without any side effects Denies SOB, palpitation, chest pain, headaches,or edema No concerns with regards to cost of medications, compliance, ability to pick up at pharmacy Reiterated the importance of regular exercise and low salt diet  Plan: GDMT ACEI/ARB/ARNI Valsartan  40 mg daily Entresto  worsened cough, may re-trial after GI evaluation  Beta blocker Carvedilol  25 mg bid   MRA Spironolactone  25 mg daily   SGLT2  Empagliflozin  caused hallucinations   Labs orderd:  none Follow up with Damien Braver in 6 weeks

## 2023-10-17 ENCOUNTER — Encounter: Payer: Self-pay | Admitting: Pulmonary Disease

## 2023-10-17 ENCOUNTER — Ambulatory Visit (INDEPENDENT_AMBULATORY_CARE_PROVIDER_SITE_OTHER): Admitting: Pulmonary Disease

## 2023-10-17 ENCOUNTER — Ambulatory Visit: Admitting: Pulmonary Disease

## 2023-10-17 VITALS — BP 146/80 | HR 58 | Temp 97.9°F | Ht 68.0 in | Wt 140.8 lb

## 2023-10-17 DIAGNOSIS — D649 Anemia, unspecified: Secondary | ICD-10-CM

## 2023-10-17 DIAGNOSIS — M542 Cervicalgia: Secondary | ICD-10-CM | POA: Diagnosis not present

## 2023-10-17 DIAGNOSIS — J3089 Other allergic rhinitis: Secondary | ICD-10-CM | POA: Diagnosis not present

## 2023-10-17 DIAGNOSIS — R0602 Shortness of breath: Secondary | ICD-10-CM

## 2023-10-17 DIAGNOSIS — R053 Chronic cough: Secondary | ICD-10-CM

## 2023-10-17 DIAGNOSIS — J849 Interstitial pulmonary disease, unspecified: Secondary | ICD-10-CM | POA: Diagnosis not present

## 2023-10-17 LAB — PULMONARY FUNCTION TEST
DL/VA % pred: 59 %
DL/VA: 2.28 ml/min/mmHg/L
DLCO unc % pred: 55 %
DLCO unc: 11.51 ml/min/mmHg
FEF 25-75 Post: 1.49 L/s
FEF 25-75 Pre: 2.23 L/s
FEF2575-%Change-Post: -33 %
FEF2575-%Pred-Post: 105 %
FEF2575-%Pred-Pre: 158 %
FEV1-%Change-Post: -11 %
FEV1-%Pred-Post: 108 %
FEV1-%Pred-Pre: 121 %
FEV1-Post: 2.39 L
FEV1-Pre: 2.69 L
FEV1FVC-%Change-Post: -15 %
FEV1FVC-%Pred-Pre: 108 %
FEV6-%Change-Post: 4 %
FEV6-%Pred-Post: 125 %
FEV6-%Pred-Pre: 119 %
FEV6-Post: 3.69 L
FEV6-Pre: 3.52 L
FEV6FVC-%Change-Post: 0 %
FEV6FVC-%Pred-Post: 108 %
FEV6FVC-%Pred-Pre: 108 %
FVC-%Change-Post: 5 %
FVC-%Pred-Post: 115 %
FVC-%Pred-Pre: 109 %
FVC-Post: 3.71 L
FVC-Pre: 3.52 L
Post FEV1/FVC ratio: 64 %
Post FEV6/FVC ratio: 100 %
Pre FEV1/FVC ratio: 76 %
Pre FEV6/FVC Ratio: 100 %
RV % pred: 95 %
RV: 2.41 L
TLC % pred: 95 %
TLC: 6 L

## 2023-10-17 NOTE — Patient Instructions (Signed)
 Full PFT completed today ? ?

## 2023-10-17 NOTE — Progress Notes (Signed)
 Subjective:    Patient ID: David Sellers, male    DOB: 06/08/1940, 83 y.o.   MRN: [id number]  Patient Care Team: Gerome Brunet, DO as PCP - General (Family Medicine) Court Dorn PARAS, MD as PCP - Cardiology (Cardiology) Court Dorn PARAS, MD as Consulting Physician (Cardiology) Chales Idol, MD as Consulting Physician (Urology) Charmayne Molly, MD as Consulting Physician (Ophthalmology) Josephina Aures, MD as Consulting Physician (Endocrinology)  Chief Complaint  Patient presents with   Follow-up    Cough is 99% better. No shortness of breath.     BACKGROUND/INTERVAL:The patient is an 83 year old gentleman who presents for follow-up of a cough.  He notes significant sinus drainage.  Prior patient of Dr. Annella last seen March 2022 at our [facility] office.  Evaluation at that time also for cough.  I first evaluated the patient 05 September 2023, this is a follow-up visit.  He had PFTs today.  HPI David Sellers is an 83 year old male who presents for follow-up of a cough.  His cough has improved significantly after discontinuing Entresto . He now experiences only a mild cough when eating.  He has an upcoming appointment with GI, previously had had similar issues and these resolved with esophageal dilatation for esophageal stricture.  He did not tolerate Spiriva  provided for him at his initial visit.  He does recall that it did help his cough however it gave him a sensation of his throat feeling full.  He has a history of low hemoglobin levels for about a year, with levels fluctuating between ten and eleven. Two years ago, his hemoglobin was at fourteen. He remains concerned due to the significant drop. He has a follow-up with his primary care doctor in five months.  He was advised that GI can evaluate for potential occult blood loss.  He has a history of asthma since childhood, resulting in lung scarring. A CT scan from 2022 confirmed the presence of potential ILD.  He  experiences shoulder pain, which he attributes to sleeping on his side. He takes Tylenol  Arthritis for arthritis in his back but notes that the shoulder pain is different. The pain is most severe in the morning and improves throughout the day.  It can happen on either of his shoulders.  He does have some cervicalgia.  No hip pain.  PFTs were performed today that show no significant airway obstruction however, diffusion capacity is moderately reduced.  DATA 03/28/2020 CT chest without contrast: Differential lobular septal thickening and widespread moderate patchy peripheral regions of ground glass opacity and consolidation with areas of crazy paving opacity.  Top normal heart size.  Small bilateral pleural effusions.  Broad differential including pulmonary edema, atypical infection, COP, amyloidosis and less likely neoplastic causes such as lymphoproliferative disorder or adenocarcinoma.  Mild mediastinal adenopathy, nonspecific.  Three-vessel CAD.   09/05/2023 CBC with differential: Showed no eosinophilia 09/05/2023 allergen panel plus IgE: IgE 77, allergy panel showed mild reactions to dust mites and roaches otherwise negative. 10/17/2023 PFTs: FEV1 2.69 L or 121% predicted, FVC 3.52 L or 109% predicted, FEV1 FVC 76%, there is no bronchodilator response.  Lung volumes within normal limits.  Diffusion capacity moderately reduced.  Review of Systems A 10 point review of systems was performed and it is as noted above otherwise negative.   Patient Active Problem List   Diagnosis Date Noted   Type 2 diabetes mellitus with complication, without long-term current use of insulin  (HCC) 03/30/2022   NSTEMI (non-ST elevated myocardial infarction) (HCC)  03/28/2022   Coronary artery disease 06/15/2021   Right inguinal hernia 11/08/2020   Acute on chronic heart failure (HCC) 09/09/2020   CHF (congestive heart failure) (HCC) 09/08/2020   Acute on chronic systolic (congestive) heart failure (HCC) 09/08/2020    HFrEF (heart failure with reduced ejection fraction) (HCC) 08/03/2020   Prolapsed internal hemorrhoids, grade 4 08/12/2019   Carotid stenosis 12/05/2013   Carotid artery stenosis 05/08/2013   Carotid artery disease (HCC) 04/17/2013   Essential hypertension 04/17/2013   Hyperlipidemia 04/17/2013   Family history of coronary artery disease 04/17/2013   Occlusion and stenosis of carotid artery without mention of cerebral infarction 04/11/2013    Social History   Tobacco Use   Smoking status: Never   Smokeless tobacco: Never  Substance Use Topics   Alcohol  use: No    Allergies  Allergen Reactions   Albuterol  Anaphylaxis    Per patient and daughter   Meloxicam Itching, Palpitations and Other (See Comments)    Headaches, Mood swings.   Morphine  And Codeine Nausea And Vomiting   Statins Other (See Comments)    Myalgias. Severe arthritic response. Tolerates Lovastatin.    Cholestoff [Plant Sterols And Stanols] Other (See Comments)    Muscle aches   Nsaids Other (See Comments)    Aleve ,  Myalgia   Breo Ellipta  [Fluticasone Furoate-Vilanterol]     thrush   Cortisone Hypertension    Patient states cortisone injections he has received for his knees cause hypertension and hyperglycemia   Darvon [Propoxyphene] Other (See Comments)    hallucinate   Doxycycline Cough   Emetrol     Other reaction(s): Hallucinate   Glimepiride Itching   Hydrochlorothiazide     Other reaction(s): Low Blood Pressure   Indomethacin     Neck pain flare   Jardiance  [Empagliflozin ]     hallucinations   Naproxen     Other reaction(s): Increased Liver Count   Penicillins     *positive allergy test*   Repatha  [Evolocumab ] Other (See Comments)    hyperglycemia   Sulfa Antibiotics Nausea And Vomiting   Terbinafine Diarrhea   Tramadol  Other (See Comments)    Hallucinations     Current Meds  Medication Sig   Acetaminophen  (TYLENOL  ARTHRITIS PAIN PO) Take 650 mg by mouth as needed.   aspirin  EC  81 MG tablet Take 81 mg by mouth every morning.   Carboxymethylcellulose Sodium (EYE DROPS OP) Apply to eye as needed (Dry eyes). Thera tears   carvedilol  (COREG ) 25 MG tablet TAKE 1 TABLET (25 MG TOTAL) BY MOUTH TWICE A DAY WITH MEALS   cetirizine (ZYRTEC) 10 MG tablet Take 10 mg by mouth 2 (two) times daily.   Chlorpheniramine Maleate (CHLOR-TRIMETON ALLERGY PO) Take by mouth as needed (Allergies).   Cholecalciferol (VITAMIN D-3) 125 MCG (5000 UT) TABS Take 5,000 Units by mouth daily.   clopidogrel  (PLAVIX ) 75 MG tablet TAKE 1 TABLET BY MOUTH EVERY DAY   doxazosin  (CARDURA ) 8 MG tablet TAKE 1 TABLET BY MOUTH EVERY DAY   isosorbide  mononitrate (IMDUR ) 60 MG 24 hr tablet TAKE 1 TABLET BY MOUTH EVERY DAY   Krill Oil 500 MG CAPS Take 500 mg by mouth at bedtime.   metFORMIN  (GLUCOPHAGE -XR) 500 MG 24 hr tablet Take 1 tablet (500 mg total) by mouth 2 (two) times daily with breakfast and lunch.   nitroGLYCERIN  (NITROSTAT ) 0.4 MG SL tablet Place 1 tablet (0.4 mg total) under the tongue every 5 (five) minutes x 3 doses as needed for  chest pain.   ONETOUCH VERIO test strip 2 (two) times daily.   pantoprazole  (PROTONIX ) 40 MG tablet TAKE 1 TABLET BY MOUTH EVERY DAY   spironolactone  (ALDACTONE ) 25 MG tablet Take 1 tablet (25 mg total) by mouth daily.   valsartan  (DIOVAN ) 40 MG tablet Take 1 tablet (40 mg total) by mouth daily.    Immunization History  Administered Date(s) Administered   Fluad Quad(high Dose 65+) 10/07/2021   Fluad Trivalent(High Dose 65+) 10/19/2022   INFLUENZA, HIGH DOSE SEASONAL PF 10/19/2016, 10/11/2018   Influenza, Quadrivalent, Recombinant, Inj, Pf 10/17/2019   Influenza-Unspecified 10/23/2020   Moderna Sars-Covid-2 Vaccination 03/25/2019, 04/15/2019        Objective:     BP (!) 146/80   Pulse (!) 58   Temp 97.9 F (36.6 C) (Temporal)   Ht 5' 8 (1.727 m)   Wt 140 lb 12.8 oz (63.9 kg)   SpO2 100%   BMI 21.41 kg/m   SpO2: 100 %  GENERAL: Well-developed  well-nourished gentleman, looks stated age, quite spry.  Fully ambulatory, no conversational dyspnea. HEAD: Normocephalic, atraumatic.  EYES: Pupils equal, round, reactive to light.  No scleral icterus.  MOUTH: Wears dentures, oral mucosa moist.  There is significant clear postnasal drip. NECK: Supple. No thyromegaly. Trachea midline. No JVD.  No adenopathy. PULMONARY: Good air entry bilaterally.  No adventitious sounds. CARDIOVASCULAR: S1 and S2. Regular rate and rhythm.  No rubs, murmurs or gallops heard. ABDOMEN: Benign. MUSCULOSKELETAL: No joint deformity, no clubbing, no edema.  Left arm examined no significant mass or raised lesion noted. NEUROLOGIC: No overt focal deficit, no gait disturbance, speech is fluent. SKIN: Intact,warm,dry.  No rashes noted. PSYCH: Mood and behavior normal.    Assessment & Plan:     ICD-10-CM   1. Chronic cough  R05.3     2. Perennial allergic rhinitis  J30.89     3. ILD (interstitial lung disease) (HCC)  J84.9 CT CHEST HIGH RESOLUTION    4. Cervicalgia  M54.2 DG Cervical Spine Complete    5. Anemia, unspecified type  D64.9       Orders Placed This Encounter  Procedures   DG Cervical Spine Complete    Standing Status:   Future    Expected Date:   10/31/2023    Expiration Date:   10/16/2024    Reason for Exam (SYMPTOM  OR DIAGNOSIS REQUIRED):   Cervicalgia    Preferred imaging location?:   [facility]   CT CHEST HIGH RESOLUTION    Standing Status:   Future    Expected Date:   10/31/2023    Expiration Date:   10/16/2024    [facility]    Discussion:  Chronic cough Chronic cough has improved significantly after discontinuation of Entresto , which was identified as the cause. Residual cough occurs when eating, possibly due to esophageal strictures. - Advise to follow sips of water when eating to alleviate cough - Continue with scheduled appointment for esophageal dilation to address potential  strictures  Anemia Anemia with hemoglobin levels between 10 and 11, previously at 14 two years ago. Possible causes include gastrointestinal blood loss or irritation from reflux. - Recommend gastroenterologist to evaluate for potential blood loss - Monitor hemoglobin levels during gastroenterology evaluation  Pulmonary fibrosis (lung scarring) Pulmonary fibrosis with scarring noted on CT scan from 2022. Breathing tests show expected results given past medical history and prior surgery.  Spirometry was normal however there is significant diffusion capacity impairment. - Order high-resolution  CT scan of the lungs to assess current status of potential ILD - Schedule follow-up in 3-4 months to review CT scan results  Bilateral shoulder pain Bilateral shoulder pain possibly related to sleeping position or arthritis. Pain improves throughout the day. Differential includes neck-related issues. - Order cervical x-rays to evaluate for potential cervical spine involvement  Advised if symptoms do not improve or worsen, to please contact office for sooner follow up or seek emergency care.    I spent 40 minutes of dedicated to the care of this patient on the date of this encounter to include pre-visit review of records, face-to-face time with the patient discussing conditions above, post visit ordering of testing, clinical documentation with the electronic health record, making appropriate referrals as documented, and communicating necessary findings to members of the patients care team.     C. Leita Sanders, MD Advanced Bronchoscopy PCCM [facility]    *This note was generated using voice recognition software/Dragon and/or AI transcription program.  Despite best efforts to proofread, errors can occur which can change the meaning. Any transcriptional errors that result from this process are unintentional and may not be fully corrected at the time of dictation.

## 2023-10-17 NOTE — Progress Notes (Signed)
 Full PFT completed today ? ?

## 2023-10-17 NOTE — Patient Instructions (Addendum)
 VISIT SUMMARY:  Today, we discussed your chronic cough, anemia, lung scarring, and shoulder pain. Your cough has improved after stopping Entresto , but you still experience a mild cough when eating. We also reviewed your hemoglobin levels, lung scarring, and shoulder pain.  YOUR PLAN:  -CHRONIC COUGH: Your chronic cough has improved significantly after stopping Entresto , which was causing it. You still have a mild cough when eating, possibly due to esophageal strictures. To help with this, take sips of water after eating to clear fluid residual. You have a scheduled appointment for esophageal dilation to address potential strictures.  -ANEMIA: Anemia means you have lower than normal hemoglobin levels, which can cause fatigue and weakness. Your levels have been between 10 and 11, down from 14 two years ago. We recommend seeing your gastroenterologist to check for potential blood loss and will monitor your hemoglobin levels during this evaluation.  -PULMONARY FIBROSIS (LUNG SCARRING): Pulmonary fibrosis is a condition where lung tissue becomes scarred, making it difficult to breathe. Your CT scan from 2022 showed some scarring, and your breathing tests are as expected given your history. We will order a high-resolution CT scan to assess the current status of your lungs and schedule a follow-up in 3-4 months to review the results.  Overall your breathing test were good but it is worthwhile to check the CT.  -BILATERAL SHOULDER PAIN: You have shoulder pain that may be related to your sleeping position or arthritis. The pain is worse in the morning and gets better throughout the day. We will order neck x-rays to check for any issues with your cervical spine and consider a high-resolution CT scan to look at your shoulder joints.  INSTRUCTIONS:  Please follow up with your primary care doctor in five months as planned. Additionally, schedule an appointment with a gastroenterologist to evaluate for potential  blood loss related to your anemia and check for esophageal strictures. We will also schedule a high-resolution CT scan of your lungs and neck x-rays to further investigate your shoulder pain. Follow up in 4 months otherwise, we will let you know the test results as they are available.

## 2023-10-18 DIAGNOSIS — M461 Sacroiliitis, not elsewhere classified: Secondary | ICD-10-CM | POA: Diagnosis not present

## 2023-10-18 DIAGNOSIS — M7918 Myalgia, other site: Secondary | ICD-10-CM | POA: Diagnosis not present

## 2023-10-18 DIAGNOSIS — M4726 Other spondylosis with radiculopathy, lumbar region: Secondary | ICD-10-CM | POA: Diagnosis not present

## 2023-10-18 DIAGNOSIS — M9902 Segmental and somatic dysfunction of thoracic region: Secondary | ICD-10-CM | POA: Diagnosis not present

## 2023-10-18 DIAGNOSIS — M9905 Segmental and somatic dysfunction of pelvic region: Secondary | ICD-10-CM | POA: Diagnosis not present

## 2023-10-18 DIAGNOSIS — M9903 Segmental and somatic dysfunction of lumbar region: Secondary | ICD-10-CM | POA: Diagnosis not present

## 2023-10-18 DIAGNOSIS — M9904 Segmental and somatic dysfunction of sacral region: Secondary | ICD-10-CM | POA: Diagnosis not present

## 2023-10-20 ENCOUNTER — Other Ambulatory Visit: Payer: Self-pay | Admitting: Cardiovascular Disease

## 2023-10-24 ENCOUNTER — Ambulatory Visit (INDEPENDENT_AMBULATORY_CARE_PROVIDER_SITE_OTHER)
Admission: RE | Admit: 2023-10-24 | Discharge: 2023-10-24 | Disposition: A | Discharge status: Home / Self Care | Source: Ambulatory Visit | Attending: Family Medicine | Admitting: Family Medicine

## 2023-10-24 ENCOUNTER — Ambulatory Visit (HOSPITAL_BASED_OUTPATIENT_CLINIC_OR_DEPARTMENT_OTHER)
Admission: RE | Admit: 2023-10-24 | Discharge: 2023-10-24 | Disposition: A | Discharge status: Home / Self Care | Source: Ambulatory Visit | Attending: Pulmonary Disease | Admitting: Pulmonary Disease

## 2023-10-24 DIAGNOSIS — M542 Cervicalgia: Secondary | ICD-10-CM | POA: Diagnosis not present

## 2023-10-24 DIAGNOSIS — J984 Other disorders of lung: Secondary | ICD-10-CM | POA: Diagnosis not present

## 2023-10-24 DIAGNOSIS — J849 Interstitial pulmonary disease, unspecified: Secondary | ICD-10-CM | POA: Diagnosis not present

## 2023-10-24 DIAGNOSIS — I6521 Occlusion and stenosis of right carotid artery: Secondary | ICD-10-CM | POA: Diagnosis not present

## 2023-10-24 DIAGNOSIS — M4802 Spinal stenosis, cervical region: Secondary | ICD-10-CM | POA: Diagnosis not present

## 2023-10-24 DIAGNOSIS — M47812 Spondylosis without myelopathy or radiculopathy, cervical region: Secondary | ICD-10-CM | POA: Diagnosis not present

## 2023-10-30 ENCOUNTER — Ambulatory Visit: Payer: Self-pay | Admitting: Pulmonary Disease

## 2023-11-08 DIAGNOSIS — M9903 Segmental and somatic dysfunction of lumbar region: Secondary | ICD-10-CM | POA: Diagnosis not present

## 2023-11-08 DIAGNOSIS — M4726 Other spondylosis with radiculopathy, lumbar region: Secondary | ICD-10-CM | POA: Diagnosis not present

## 2023-11-08 DIAGNOSIS — M9904 Segmental and somatic dysfunction of sacral region: Secondary | ICD-10-CM | POA: Diagnosis not present

## 2023-11-08 DIAGNOSIS — M9902 Segmental and somatic dysfunction of thoracic region: Secondary | ICD-10-CM | POA: Diagnosis not present

## 2023-11-08 DIAGNOSIS — M9905 Segmental and somatic dysfunction of pelvic region: Secondary | ICD-10-CM | POA: Diagnosis not present

## 2023-11-08 DIAGNOSIS — M461 Sacroiliitis, not elsewhere classified: Secondary | ICD-10-CM | POA: Diagnosis not present

## 2023-11-08 DIAGNOSIS — M7918 Myalgia, other site: Secondary | ICD-10-CM | POA: Diagnosis not present

## 2023-11-14 ENCOUNTER — Ambulatory Visit (HOSPITAL_COMMUNITY)
Admission: RE | Admit: 2023-11-14 | Discharge: 2023-11-14 | Disposition: A | Discharge status: Home / Self Care | Source: Ambulatory Visit | Attending: Cardiovascular Disease | Admitting: Cardiovascular Disease

## 2023-11-14 ENCOUNTER — Ambulatory Visit: Payer: Self-pay | Admitting: Cardiovascular Disease

## 2023-11-14 DIAGNOSIS — I6523 Occlusion and stenosis of bilateral carotid arteries: Secondary | ICD-10-CM | POA: Insufficient documentation

## 2023-11-14 DIAGNOSIS — I6529 Occlusion and stenosis of unspecified carotid artery: Secondary | ICD-10-CM | POA: Diagnosis not present

## 2023-11-21 DIAGNOSIS — D649 Anemia, unspecified: Secondary | ICD-10-CM | POA: Diagnosis not present

## 2023-11-21 DIAGNOSIS — R1310 Dysphagia, unspecified: Secondary | ICD-10-CM | POA: Diagnosis not present

## 2023-11-22 DIAGNOSIS — M461 Sacroiliitis, not elsewhere classified: Secondary | ICD-10-CM | POA: Diagnosis not present

## 2023-11-22 DIAGNOSIS — M7918 Myalgia, other site: Secondary | ICD-10-CM | POA: Diagnosis not present

## 2023-11-22 DIAGNOSIS — M9905 Segmental and somatic dysfunction of pelvic region: Secondary | ICD-10-CM | POA: Diagnosis not present

## 2023-11-22 DIAGNOSIS — M9904 Segmental and somatic dysfunction of sacral region: Secondary | ICD-10-CM | POA: Diagnosis not present

## 2023-11-22 DIAGNOSIS — M9902 Segmental and somatic dysfunction of thoracic region: Secondary | ICD-10-CM | POA: Diagnosis not present

## 2023-11-22 DIAGNOSIS — M9903 Segmental and somatic dysfunction of lumbar region: Secondary | ICD-10-CM | POA: Diagnosis not present

## 2023-11-22 DIAGNOSIS — M4726 Other spondylosis with radiculopathy, lumbar region: Secondary | ICD-10-CM | POA: Diagnosis not present

## 2023-11-22 NOTE — Progress Notes (Unsigned)
 Cardiology Office Note:  .   Date:  11/23/2023  ID:  David Sellers, DOB 04-29-1940, MRN [id number] PCP: Gerome Brunet, DO  [facility] Providers Cardiologist:  Dorn Lesches, MD {  History of Present Illness: .   David Sellers is a 83 y.o. male  with PMHx of CAD (Cath in 09/2020: left main/three-vessel disease with low filling pressures. Poor candidate for CABG, high risk PCI candidate. Cardiac MRI 09/2020: viability, however, medical therapy was recommended. S/p NSTEMI in 03/2022, continued medical therapy), HFrEF/ICM (EF 20-25% in 2022 with most recent EF 35-40% in 03/2022), palpitations, carotid artery stenosis s/p left CEA in 11/23/2022 (Carotid US  11/2023: mild LICA, moderate RICA), hypertension, hyperlipidemia, type 2 diabetes, and musical ear syndrome who reports to [facility] office for follow up.   Last seen in heartcare OV 08/23/2023 by Dr. Lesches for follow up. Doing well from cardiac standpoint w/o any complaints. BP was elevated to 182/80. Suspect likely due to left arm pain. Requested 30 day BP log, follow up with Pharm D in 4-6 weeks then APP.  Continued on ASA 81 mg daily, Coreg  25 mg BID, Plavix  75 mg daily, Imdur  60 mg daily, NTG PRN, Entresto  97-103 mg BID, Spirolactone 25 mg daily.   Followed up with pharmacy 10/06/2023. Noted white coat HTN with office BP 180/86 and home BP 128/65.  D/C Entresto  and Praulent secondary to worsening cough but may retrial after GI evaluation. Started Valsartan  40 mg.   Today, denies any cardiology complaints including chest pain, SOB, LE edema, palpitations, dizziness, syncope. Reports that cough resolved since being off of Entresto . He is still taking Praluent  without any concerns. Reports adherence with medications. Otherwise, reports pain in shoulder that has improved with increasing Tylenol  to 650 mg x2 TID. Reviewed current dose is already at max dose 3900 mg/day and discussed possible adverse event and toxicity with any higher dose. Patient is  able to walk 20 mins daily. He is still works as a Programmer, multimedia. Denies tobacco use/alcohol /drug use. Denies any recent hospitalizations or visits to the emergency department. He notes pending possible esophagus dilation procedure in the future. Also noted the need for colonoscopy but patient plans to decline due to previous family member death associated with same procedure.    ROS: 10 point review of system has been reviewed and considered negative except ones been listed in the HPI.   Studies Reviewed: [name]   Cath 09/2020   Prox RCA lesion is 80% stenosed.   Mid RCA lesion is 95% stenosed.   Ost Cx lesion is 100% stenosed.   Ost LM lesion is 80% stenosed.   Mid LAD lesion is 60% stenosed.   1st Diag lesion is 95% stenosed.   Dist LAD lesion is 70% stenosed. 1. Low filling pressures.  2. Preserved cardiac output.  3. Severe coronary disease with critical mid RCA stenosis, occluded ostial LCx with collaterals from PDA, and 80% left main stenosis with moderate LAD disease.    I will admit the patient for consultation for CABG.  This is complicated by ischemic cardiomyopathy, though he is well-compensated.   Cardiac MRI 09/2020 IMPRESSION: 1. Normal left ventricular size with wall motion abnormalities as noted above. LV EF 25-30% (images not ideal for quantification by Simpson's rule). 2.  Normal RV size and systolic function 3. There is significant myocardial viability. There is non-transmural scar only in the basal to mid inferolateral wall. May see significant LV functional improvement with full revascularization.  Echo IMPRESSIONS 03/28/2022  1. Left ventricular ejection fraction, by estimation, is 35 to 40%. The  left ventricle has moderately decreased function. The left ventricle  demonstrates regional wall motion abnormalities (see scoring  diagram/findings for description). There is mild  concentric left ventricular hypertrophy. Left ventricular diastolic  parameters are consistent  with Grade I diastolic dysfunction (impaired  relaxation). There is akinesis of the left ventricular, basal-mid lateral  wall and inferolateral wall.   2. Right ventricular systolic function is normal. The right ventricular  size is normal.   3. Left atrial size was mildly dilated.   4. The mitral valve is normal in structure. Trivial mitral valve  regurgitation. No evidence of mitral stenosis.   5. The aortic valve is tricuspid. There is mild calcification of the  aortic valve. Aortic valve regurgitation is not visualized. No aortic  stenosis is present.   6. Aortic dilatation noted. There is moderate dilatation of the aortic  root, measuring 44 mm.   7. The inferior vena cava is normal in size with greater than 50%  respiratory variability, suggesting right atrial pressure of 3 mmHg.    Carotid US  11/2023 Summary:  Right Carotid: Velocities in the right ICA are consistent with a 40-59%                 stenosis. Non-hemodynamically significant plaque <50% noted  in                the CCA.   Left Carotid: Velocities in the left ICA are consistent with a 1-39%  stenosis,               s/p endarterectomy. Non-hemodynamically significant plaque  <50%               noted in the CCA.   Vertebrals:  Left vertebral artery demonstrates antegrade flow. Right  vertebral              artery demonstrates bidirectional flow.  Subclavians: Right subclavian artery was stenotic. Normal flow  hemodynamics were               seen in the left subclavian artery.   *See table(s) above for measurements and observations.  Suggest follow up study in 12 months. No significant change since  05/09/2022. Minimal BP differential between left and right arm of 20 mm Hg  with findings suggestive of Rt subclavian artery stenosis (stable).   Physical Exam:   VS:  BP (!) 150/80 (BP Location: Left Arm, Patient Position: Sitting, Cuff Size: Normal)   Pulse 69   Ht 5' 5 (1.651 m)   Wt 143 lb 6.4 oz (65 kg)    SpO2 99%   BMI 23.86 kg/m    Wt Readings from Last 3 Encounters:  11/23/23 143 lb 6.4 oz (65 kg)  10/17/23 140 lb 12.8 oz (63.9 kg)  10/17/23 140 lb 12.8 oz (63.9 kg)    GEN: Well nourished, well developed in no acute distress while sitting in chair.  NECK: No JVD; Bruit noted in right carotid CARDIAC: RRR, no murmurs, rubs, gallops RESPIRATORY:  Clear to auscultation without rales, wheezing or rhonchi  ABDOMEN: Soft, non-tender, non-distended EXTREMITIES:  No edema; No deformity   ASSESSMENT AND PLAN: .   Coronary artery disease involving native coronary artery of native heart without angina pectoris Hyperlipidemia LDL goal <70 Cath in 09/2020: left main/three-vessel disease with low filling pressures. Poor candidate for CABG, high risk PCI candidate. Cardiac MRI 09/2020: viability, however, medical therapy was recommended.  S/p NSTEMI in 03/2022, continued medical therapy Denies angina symptoms. No need for further ischemic evaluation at this time.  LDL 57 & LFT WNL in 03/2022.  Continue ASA 81 mg daily, Coreg  25 mg BID, Plavix  75 mg daily, Imdur  60 mg daily, NTG PRN, Praluent  q 2 weeks.  Previously noted intolerance to statins.   HFrEF (heart failure with reduced ejection fraction)  Ischemic cardiomyopathy ECHO 03/2022: EF 35-40%, +RWMA, mild LVH, G1DD, mild AV calcification. Order ECHO to revaluate EF since not on Entresto .  Denies SOB, edema, orthopnea, or PND. Appears Euvolemic on exam.  K 4.2 and Cr 0.79 in 09/2023. Continue on Valsartan  40 mg daily, Spirolactone 25 mg daily, Coreg  25 mg BID Previously note, Intolerant to SGLT2i.  Per Pharmacy 10/06/2023, D/C Entresto  secondary to worsening cough but may retrial after GI evaluation. Reported coughing resolving without Entresto .  Encouraged low sodium diet, fluid restriction <2L, and daily weights.  Educated to contact our office for weight gain of 2 lbs overnight or 5 lbs in one week. ED precautions discussed.    Essential  hypertension Reviewed home BP log with BP averaging 110-160/50-80 with mainly SBP 110-130's.  Discussed proper BP measurement recommendations.  BP this OV well controlled today: 150/80. Did not recheck BP with hx of elevated BP in office and also reviewed home BP log.  Managed by GDMT as above.  Encourage physical activity as tolerated and heart healthy low sodium diet. Discussed limiting sodium intake to < 2 grams daily.     Aortic root dilation ECHO 03/2022: moderately dilated aortic root at 44 mm.  ECHO order pending as above.   Palpitations Previously declined monitor.  Denies any recent palpitations.   Bilateral carotid artery stenosis Bruit noted in right carotid c/w with Carotid US  11/2023: mild LICA, moderate RICA.  Denies presyncope/syncope.  Continue to monitor.  Will plan to repeat Carotid US  in 11/2024  Preoperative Cardiovascular Risk Assessment Although no pre-op clearance requested yet, he notes pending possible esophagus dilation procedure in the future.  Revised Cardiac Risk Index (RCRI) with 1 point and perioperative risk of major cardiac events is 1%.  Duke Activity Status Index (DASI) with > 4 mets .  From cardiac standpoint, this patient is at acceptable risk for the planned procedure without further cardiovascular testing.    Dispo:  Follow up in 6 months with APP. Patient would like to see Dr. Court yearly.   Signed, Lorette CINDERELLA Kapur, [name]

## 2023-11-23 ENCOUNTER — Telehealth: Payer: Self-pay

## 2023-11-23 ENCOUNTER — Ambulatory Visit: Attending: Nurse Practitioner | Admitting: Physician Assistant

## 2023-11-23 ENCOUNTER — Encounter: Payer: Self-pay | Admitting: Nurse Practitioner

## 2023-11-23 VITALS — BP 150/80 | HR 69 | Ht 65.0 in | Wt 143.4 lb

## 2023-11-23 DIAGNOSIS — I6523 Occlusion and stenosis of bilateral carotid arteries: Secondary | ICD-10-CM

## 2023-11-23 DIAGNOSIS — R002 Palpitations: Secondary | ICD-10-CM | POA: Diagnosis not present

## 2023-11-23 DIAGNOSIS — Z01810 Encounter for preprocedural cardiovascular examination: Secondary | ICD-10-CM

## 2023-11-23 DIAGNOSIS — E785 Hyperlipidemia, unspecified: Secondary | ICD-10-CM

## 2023-11-23 DIAGNOSIS — I2510 Atherosclerotic heart disease of native coronary artery without angina pectoris: Secondary | ICD-10-CM

## 2023-11-23 DIAGNOSIS — I10 Essential (primary) hypertension: Secondary | ICD-10-CM

## 2023-11-23 DIAGNOSIS — I5020 Unspecified systolic (congestive) heart failure: Secondary | ICD-10-CM | POA: Diagnosis not present

## 2023-11-23 DIAGNOSIS — I255 Ischemic cardiomyopathy: Secondary | ICD-10-CM

## 2023-11-23 DIAGNOSIS — I77810 Thoracic aortic ectasia: Secondary | ICD-10-CM

## 2023-11-23 NOTE — Telephone Encounter (Signed)
   Pre-operative Risk Assessment    Patient Name: David Sellers  DOB: 1940/08/05 MRN: [id number]   Date of last office visit: 11/23/23 Date of next office visit:    Request for Surgical Clearance    Procedure:  Colonoscopy/EGD w/ dilation   Date of Surgery:  Clearance TBD                                 Surgeon:  Dr. Evalene ALF Misenhelmer  Surgeon's Group or Practice Name:  [facility]  Phone number:  [phone] Fax number:  [phone]   Type of Clearance Requested:   - Medical  - Pharmacy:  Hold Aspirin  and Clopidogrel  (Plavix ) Aspirin  5 days prior and Plavix  5 days prior    Type of Anesthesia:  Not Indicated   Additional requests/questions:    [name]   11/23/2023, 3:16 PM

## 2023-11-23 NOTE — Patient Instructions (Addendum)
 Medication Instructions:  Your physician recommends that you continue on your current medications as directed. Please refer to the Current Medication list given to you today.  *If you need a refill on your cardiac medications before your next appointment, please call your pharmacy*  Lab Work: NONE ordered at this time of appointment   Testing/Procedures: Your physician has requested that you have an echocardiogram. Echocardiography is a painless test that uses sound waves to create images of your heart. It provides your doctor with information about the size and shape of your heart and how well your heart's chambers and valves are working. This procedure takes approximately one hour. There are no restrictions for this procedure. Please do NOT wear cologne, perfume, aftershave, or lotions (deodorant is allowed). Please arrive 15 minutes prior to your appointment time.  Please note: We ask at that you not bring children with you during ultrasound (echo/ vascular) testing. Due to room size and safety concerns, children are not allowed in the ultrasound rooms during exams. Our front office staff cannot provide observation of children in our lobby area while testing is being conducted. An adult accompanying a patient to their appointment will only be allowed in the ultrasound room at the discretion of the ultrasound technician under special circumstances. We apologize for any inconvenience.   Follow-Up: At [facility], you and your health needs are our priority.  As part of our continuing mission to provide you with exceptional heart care, our providers are all part of one team.  This team includes your primary Cardiologist (physician) and Advanced Practice Providers or APPs (Physician Assistants and Nurse Practitioners) who all work together to provide you with the care you need, when you need it.  Your next appointment:   6 month(s)  Provider:   Any APP    We recommend signing up for  the patient portal called MyChart.  Sign up information is provided on this After Visit Summary.  MyChart is used to connect with patients for Virtual Visits (Telemedicine).  Patients are able to view lab/test results, encounter notes, upcoming appointments, etc.  Non-urgent messages can be sent to your provider as well.   To learn more about what you can do with MyChart, go to [email or web address].

## 2023-11-24 NOTE — Telephone Encounter (Signed)
   Patient Name: David Sellers  DOB: January 21, 1941 MRN: [id number]  Primary Cardiologist: Dorn Lesches, MD  Chart reviewed as part of pre-operative protocol coverage. Given past medical history and time since last visit, based on ACC/AHA guidelines, David Sellers is at acceptable risk for the planned procedure without further cardiovascular testing.   Preoperative Cardiovascular Risk Assessment Although no pre-op clearance requested yet, he notes pending possible esophagus dilation procedure in the future.  Revised Cardiac Risk Index (RCRI) with 1 point and perioperative risk of major cardiac events is 1%.  Duke Activity Status Index (DASI) with > 4 mets .  From cardiac standpoint, this patient is at acceptable risk for the planned procedure without further cardiovascular testing.  The patient was advised that if he develops new symptoms prior to surgery to contact our office to arrange for a follow-up visit, and he verbalized understanding.  I will route this recommendation to the requesting party via Epic fax function and remove from pre-op pool.  Please call with questions.  Lamarr Satterfield, NP 11/24/2023, 10:58 AM

## 2023-11-29 ENCOUNTER — Telehealth (HOSPITAL_BASED_OUTPATIENT_CLINIC_OR_DEPARTMENT_OTHER): Payer: Self-pay

## 2023-11-29 ENCOUNTER — Other Ambulatory Visit: Payer: Self-pay | Admitting: Cardiovascular Disease

## 2023-11-29 NOTE — Telephone Encounter (Signed)
   Patient Name: David Sellers  DOB: [birth year] MRN: [id number]  Primary Cardiologist: Dorn Lesches, MD  Chart reviewed as part of pre-operative protocol coverage. Given past medical history and time since last visit, based on ACC/AHA guidelines, CRUISE BAUMGARDNER is at acceptable risk for the planned procedure without further cardiovascular testing.  Preoperative Cardiovascular Risk Assessment Although no pre-op clearance requested yet, he notes pending possible esophagus dilation procedure in the future.  Revised Cardiac Risk Index (RCRI) with 1 point and perioperative risk of major cardiac events is 1%.  Duke Activity Status Index (DASI) with > 4 mets .  From cardiac standpoint, this patient is at acceptable risk for the planned procedure without further cardiovascular testing.   Per office protocol, if patient is without any new symptoms or concerns at the time of their virtual visit, he may hold ASA and Plavix   for 5 days prior to procedure. Please resume ASA and Plavix  as soon as possible postprocedure, at the discretion of the surgeon.    The patient was advised that if he develops new symptoms prior to surgery to contact our office to arrange for a follow-up visit, and he verbalized understanding.  I will route this recommendation to the requesting party via Epic fax function and remove from pre-op pool.  Please call with questions.  Lamarr Satterfield, NP 11/29/2023, 10:50 AM

## 2023-11-29 NOTE — Telephone Encounter (Signed)
 Office returning call from earlier. Office states that the type of sedation will be Propofol . Please advise

## 2023-11-29 NOTE — Telephone Encounter (Signed)
 Office performing procedure asking clearance be faxed. Number below.   [phone]

## 2023-11-29 NOTE — Telephone Encounter (Signed)
   Pre-operative Risk Assessment    Patient Name: David Sellers  DOB: 03/22/1940 MRN: [id number]   Date of last office visit: 11/23/23 with Sheron RIGGERS  Date of next office visit: NA   Request for Surgical Clearance    Procedure:  Colonoscopy and Endoscopy with Dilation  Date of Surgery:  Clearance TBD                                Surgeon:  Dr. Evalene Rattler Surgeon's Group or Practice Name:  [facility]  Phone number:  [phone] Fax number:  [phone]   Type of Clearance Requested:   - Medical  - Pharmacy:  Hold Aspirin  and Clopidogrel  (Plavix ) hold 5 days prior   Type of Anesthesia:  Not Indicated   Additional requests/questions:    [name]   11/29/2023, 9:27 AM

## 2023-11-29 NOTE — Telephone Encounter (Signed)
 See notes.

## 2023-12-06 DIAGNOSIS — M461 Sacroiliitis, not elsewhere classified: Secondary | ICD-10-CM | POA: Diagnosis not present

## 2023-12-06 DIAGNOSIS — M9905 Segmental and somatic dysfunction of pelvic region: Secondary | ICD-10-CM | POA: Diagnosis not present

## 2023-12-06 DIAGNOSIS — M9902 Segmental and somatic dysfunction of thoracic region: Secondary | ICD-10-CM | POA: Diagnosis not present

## 2023-12-06 DIAGNOSIS — M4726 Other spondylosis with radiculopathy, lumbar region: Secondary | ICD-10-CM | POA: Diagnosis not present

## 2023-12-06 DIAGNOSIS — M9903 Segmental and somatic dysfunction of lumbar region: Secondary | ICD-10-CM | POA: Diagnosis not present

## 2023-12-06 DIAGNOSIS — M9904 Segmental and somatic dysfunction of sacral region: Secondary | ICD-10-CM | POA: Diagnosis not present

## 2023-12-06 DIAGNOSIS — M7918 Myalgia, other site: Secondary | ICD-10-CM | POA: Diagnosis not present

## 2023-12-31 ENCOUNTER — Other Ambulatory Visit: Payer: Self-pay | Admitting: Cardiovascular Disease

## 2024-01-08 ENCOUNTER — Ambulatory Visit (HOSPITAL_COMMUNITY)
Admission: RE | Admit: 2024-01-08 | Discharge: 2024-01-08 | Disposition: A | Source: Ambulatory Visit | Attending: Cardiovascular Disease | Admitting: Cardiovascular Disease

## 2024-01-08 DIAGNOSIS — I77810 Thoracic aortic ectasia: Secondary | ICD-10-CM | POA: Diagnosis not present

## 2024-01-08 DIAGNOSIS — I5020 Unspecified systolic (congestive) heart failure: Secondary | ICD-10-CM | POA: Insufficient documentation

## 2024-01-08 LAB — ECHOCARDIOGRAM COMPLETE
Area-P 1/2: 3.37 cm2
S' Lateral: 4.79 cm

## 2024-01-08 MED ORDER — PERFLUTREN LIPID MICROSPHERE
1.0000 mL | INTRAVENOUS | Status: AC | PRN
  Administered 2024-01-08: 2 mL via INTRAVENOUS

## 2024-01-09 DIAGNOSIS — K219 Gastro-esophageal reflux disease without esophagitis: Secondary | ICD-10-CM | POA: Diagnosis not present

## 2024-01-09 DIAGNOSIS — K222 Esophageal obstruction: Secondary | ICD-10-CM | POA: Diagnosis not present

## 2024-01-09 DIAGNOSIS — I2510 Atherosclerotic heart disease of native coronary artery without angina pectoris: Secondary | ICD-10-CM | POA: Diagnosis not present

## 2024-01-09 DIAGNOSIS — E785 Hyperlipidemia, unspecified: Secondary | ICD-10-CM | POA: Diagnosis not present

## 2024-01-09 DIAGNOSIS — Z7902 Long term (current) use of antithrombotics/antiplatelets: Secondary | ICD-10-CM | POA: Diagnosis not present

## 2024-01-09 DIAGNOSIS — J45909 Unspecified asthma, uncomplicated: Secondary | ICD-10-CM | POA: Diagnosis not present

## 2024-01-09 DIAGNOSIS — Z7982 Long term (current) use of aspirin: Secondary | ICD-10-CM | POA: Diagnosis not present

## 2024-01-09 DIAGNOSIS — E1151 Type 2 diabetes mellitus with diabetic peripheral angiopathy without gangrene: Secondary | ICD-10-CM | POA: Diagnosis not present

## 2024-01-09 DIAGNOSIS — R410 Disorientation, unspecified: Secondary | ICD-10-CM | POA: Diagnosis not present

## 2024-01-09 DIAGNOSIS — I509 Heart failure, unspecified: Secondary | ICD-10-CM | POA: Diagnosis not present

## 2024-01-09 DIAGNOSIS — Z79899 Other long term (current) drug therapy: Secondary | ICD-10-CM | POA: Diagnosis not present

## 2024-01-09 DIAGNOSIS — K2100 Gastro-esophageal reflux disease with esophagitis, without bleeding: Secondary | ICD-10-CM | POA: Diagnosis not present

## 2024-01-09 DIAGNOSIS — M1990 Unspecified osteoarthritis, unspecified site: Secondary | ICD-10-CM | POA: Diagnosis not present

## 2024-01-09 DIAGNOSIS — K449 Diaphragmatic hernia without obstruction or gangrene: Secondary | ICD-10-CM | POA: Diagnosis not present

## 2024-01-09 DIAGNOSIS — I252 Old myocardial infarction: Secondary | ICD-10-CM | POA: Diagnosis not present

## 2024-01-09 DIAGNOSIS — D509 Iron deficiency anemia, unspecified: Secondary | ICD-10-CM | POA: Diagnosis not present

## 2024-01-10 ENCOUNTER — Ambulatory Visit: Payer: Self-pay | Admitting: Physician Assistant

## 2024-01-10 DIAGNOSIS — I10 Essential (primary) hypertension: Secondary | ICD-10-CM

## 2024-01-11 MED ORDER — VALSARTAN 80 MG PO TABS: 80.0000 mg | ORAL_TABLET | Freq: Every day | ORAL | 1 refills | Status: AC

## 2024-01-11 MED ORDER — NITROGLYCERIN 0.4 MG SL SUBL: 0.4000 mg | SUBLINGUAL_TABLET | SUBLINGUAL | 2 refills | Status: AC | PRN

## 2024-01-16 ENCOUNTER — Encounter (HOSPITAL_COMMUNITY): Payer: Self-pay | Admitting: Surgery

## 2024-01-26 LAB — BASIC METABOLIC PANEL WITH GFR
BUN/Creatinine Ratio: 20 (ref 10–24)
BUN: 18 mg/dL (ref 8–27)
CO2: 20 mmol/L (ref 20–29)
Calcium: 10.1 mg/dL (ref 8.6–10.2)
Chloride: 101 mmol/L (ref 96–106)
Creatinine, Ser: 0.91 mg/dL (ref 0.76–1.27)
Glucose: 172 mg/dL — ABNORMAL HIGH (ref 70–99)
Potassium: 4.8 mmol/L (ref 3.5–5.2)
Sodium: 138 mmol/L (ref 134–144)
eGFR: 83 mL/min/1.73

## 2024-01-29 NOTE — Telephone Encounter (Signed)
 Lab results were read to the patient and he informed me with his glucose levels he had not been fasting, but he will address these concerns with his PCP at his next follow up with her. He is not concerned at this present time. He also informed me he would like to only see providers at our [facility]. No additional questions at this given time.

## 2024-02-03 ENCOUNTER — Other Ambulatory Visit (HOSPITAL_COMMUNITY): Payer: Self-pay | Admitting: Cardiovascular Disease

## 2024-02-13 ENCOUNTER — Encounter: Payer: Self-pay | Admitting: Pharmacist Clinician (PhC)/ Clinical Pharmacy Specialist

## 2024-03-04 ENCOUNTER — Ambulatory Visit: Admitting: Pulmonary Disease

## 2024-03-07 ENCOUNTER — Ambulatory Visit: Admitting: Pulmonary Disease

## 2024-03-07 ENCOUNTER — Encounter: Payer: Self-pay | Admitting: Pulmonary Disease

## 2024-03-07 VITALS — BP 142/76 | HR 68 | Temp 98.0°F | Ht 65.0 in | Wt 142.0 lb

## 2024-03-07 DIAGNOSIS — J8410 Pulmonary fibrosis, unspecified: Secondary | ICD-10-CM

## 2024-03-07 DIAGNOSIS — T464X5A Adverse effect of angiotensin-converting-enzyme inhibitors, initial encounter: Secondary | ICD-10-CM | POA: Diagnosis not present

## 2024-03-07 DIAGNOSIS — R058 Other specified cough: Secondary | ICD-10-CM | POA: Diagnosis not present

## 2024-03-07 NOTE — Patient Instructions (Signed)
 VISIT SUMMARY:  You had a follow-up appointment today to discuss your chronic cough. You have not experienced a cough in the past three months since switching to valsartan . You also mentioned experiencing hoarseness after an endoscopy procedure in December and a sensation in your fingers. Your blood sugar levels are well-controlled, and you have started taking iron supplements, which you find beneficial.  YOUR PLAN:  -COUGH: Your chronic cough has resolved for the past three months since you switched to valsartan . A recent CT scan showed no significant findings, indicating that the previous inflammation and infection, likely due to aspiration, have resolved. You should continue taking valsartan  to manage your blood pressure. We will have a follow-up appointment in six months to monitor for any recurrence of the cough or other issues. Please report any new symptoms or if the cough returns.  INSTRUCTIONS:  Please follow up with the specialist next week regarding your hoarseness. Continue taking your iron supplements and monitor your blood sugar levels. We will see you in six months for a follow-up appointment to check on your cough and overall health.

## 2024-03-07 NOTE — Progress Notes (Signed)
 "  Subjective:    Patient ID: David Sellers, male    DOB: 05-23-1940, 84 y.o.   MRN: [id number]  Patient Care Team: Gerome Brunet, DO as PCP - General (Family Medicine) Court Dorn PARAS, MD as PCP - Cardiology (Cardiology) Court Dorn PARAS, MD as Consulting Physician (Cardiology) Chales Idol, MD as Consulting Physician (Urology) Charmayne Molly, MD as Consulting Physician (Ophthalmology) Josephina Aures, MD as Consulting Physician (Endocrinology)  Chief Complaint  Patient presents with   Medical Management of Chronic Issues    Patient reports cough is better after discontinuing Entresto  to Diovan .     BACKGROUND/INTERVAL:The patient is an 84 year old gentleman who presents for follow-up of a cough.  Prior patient of Dr. Annella last seen March 2022 at our [facility] office.  Evaluation at that time also for cough.  I first evaluated the patient 05 September 2023, last follow-up here was on 17 October 2023.  Since his last visit he has discontinue Entresto . HPI Discussed the use of AI scribe software for clinical note transcription with the patient, who gave verbal consent to proceed.  History of Present Illness   David Sellers is an 84 year old male who presents for follow-up of chronic cough.  He presents with his wife, [name].  He has not experienced a cough in the past three months. Previously, he was on Entresto  which was suspected as the cause of his  persistent cough, this has resolved since switching to valsartan .  He underwent a procedure to relieve an esophageal stricture in December, after which he has been experiencing hoarseness. He plans to follow up with the GI specialist next week.  But since that procedure, he has not experienced any dysphagia.  Reflux symptoms are also well-controlled.  He describes a sensation in his fingers where they do not always respond as expected.  This is followed closely by his primary physician and by his endocrinologist.  His  blood sugar levels are now well-controlled. He has started taking iron supplements for anemia, which he believes have been beneficial.   CT scan performed in September was consistent with mild postinflammatory pulmonary fibrosis and prior granulomatous disease.  Nothing to suggest IPF.    DATA 03/28/2020 CT chest without contrast: Differential lobular septal thickening and widespread moderate patchy peripheral regions of ground glass opacity and consolidation with areas of crazy paving opacity.  Top normal heart size.  Small bilateral pleural effusions.  Broad differential including pulmonary edema, atypical infection, COP, amyloidosis and less likely neoplastic causes such as lymphoproliferative disorder or adenocarcinoma.  Mild mediastinal adenopathy, nonspecific.  Three-vessel CAD.   09/05/2023 CBC with differential: Showed no eosinophilia 09/05/2023 allergen panel plus IgE: IgE 77, allergy panel showed mild reactions to dust mites and roaches otherwise negative. 10/17/2023 PFTs: FEV1 2.69 L or 121% predicted, FVC 3.52 L or 109% predicted, FEV1 FVC 76%, there is no bronchodilator response.  Lung volumes within normal limits.  Diffusion capacity moderately reduced. 10/24/2023 CT chest high-res: Scattered calcified micronodules suggestive of prior granulomatous disease.  Mild residual subpleural interlobular septal thickening and ground glass attenuation in the left lower lobe and subpleural right upper lobe likely sequela from prior infection/inflammation.  No honeycombing.  Review of Systems A 10 point review of systems was performed and it is as noted above otherwise negative.   Patient Active Problem List   Diagnosis Date Noted   Type 2 diabetes mellitus with complication, without long-term current use of insulin  (HCC) 03/30/2022   NSTEMI (non-ST elevated myocardial  infarction) (HCC) 03/28/2022   Coronary artery disease 06/15/2021   Right inguinal hernia 11/08/2020   Acute on chronic heart  failure (HCC) 09/09/2020   CHF (congestive heart failure) (HCC) 09/08/2020   Acute on chronic systolic (congestive) heart failure (HCC) 09/08/2020   HFrEF (heart failure with reduced ejection fraction) (HCC) 08/03/2020   Prolapsed internal hemorrhoids, grade 4 08/12/2019   Carotid stenosis 12/05/2013   Carotid artery stenosis 05/08/2013   Carotid artery disease 04/17/2013   Essential hypertension 04/17/2013   Hyperlipidemia 04/17/2013   Family history of coronary artery disease 04/17/2013   Occlusion and stenosis of carotid artery without mention of cerebral infarction 04/11/2013    Social History   Tobacco Use   Smoking status: Never   Smokeless tobacco: Never  Substance Use Topics   Alcohol  use: No    Allergies[1]  Active Medications[2]  Immunization History  Administered Date(s) Administered   Fluad Quad(high Dose 65+) 10/07/2021   Fluad Trivalent(High Dose 65+) 10/19/2022   INFLUENZA, HIGH DOSE SEASONAL PF 10/19/2016, 10/11/2018, 10/17/2023   Influenza, Quadrivalent, Recombinant, Inj, Pf 10/17/2019   Influenza-Unspecified 10/23/2020   Moderna Sars-Covid-2 Vaccination 03/25/2019, 04/15/2019        Objective:     Vitals:   03/07/24 1342  BP: (!) 164/84  Pulse: 68  Temp: 98 F (36.7 C)  Height: 5' 5 (1.651 m)  Weight: 142 lb (64.4 kg)  SpO2: 99%  TempSrc: Temporal  BMI (Calculated): 23.63     SpO2: 99 %  GENERAL: Well-developed well-nourished gentleman, looks stated age, quite spry.  Fully ambulatory, no conversational dyspnea. HEAD: Normocephalic, atraumatic.  EYES: Pupils equal, round, reactive to light.  No scleral icterus.  MOUTH: Wears dentures, oral mucosa moist. NECK: Supple. No thyromegaly. Trachea midline. No JVD.  No adenopathy. PULMONARY: Good air entry bilaterally.  No adventitious sounds. CARDIOVASCULAR: S1 and S2. Regular rate and rhythm.  No rubs, murmurs or gallops heard. ABDOMEN: Benign. MUSCULOSKELETAL: No joint deformity, no  clubbing, no edema.  NEUROLOGIC: No overt focal deficit, no gait disturbance, speech is fluent. SKIN: Intact,warm,dry.  No rashes noted. PSYCH: Mood and behavior normal.        Assessment & Plan:     ICD-10-CM   1. Cough due to ACE inhibitor - resolved  R05.8    T46.4X5A     2. Postinflammatory pulmonary fibrosis (HCC) - MILD  J84.10      Discussion:    Cough, ACE mediated Resolved for three months. Current management with valsartan  is effective in preventing recurrence. Recent CT scan showed no concerning significant findings, indicating resolution of previous inflammation and infection likely due to aspiration. - Continue valsartan  for cough management. - Scheduled follow-up in six months to monitor for recurrence of cough or other issues. - Advised to report any new symptoms or recurrence of cough.     6 months follow-up.   Advised if symptoms do not improve or worsen, to please contact office for sooner follow up or seek emergency care.    I spent 30 minutes of dedicated to the care of this patient on the date of this encounter to include pre-visit review of records, face-to-face time with the patient discussing conditions above, post visit ordering of testing, clinical documentation with the electronic health record, making appropriate referrals as documented, and communicating necessary findings to members of the patients care team.     C. Leita Sanders, MD Advanced Bronchoscopy PCCM [facility]    *This note was generated using voice recognition software/Dragon and/or  AI transcription program.  Despite best efforts to proofread, errors can occur which can change the meaning. Any transcriptional errors that result from this process are unintentional and may not be fully corrected at the time of dictation.    [1]  Allergies Allergen Reactions   Albuterol  Anaphylaxis    Per patient and daughter   Meloxicam Itching, Palpitations and Other (See  Comments)    Headaches, Mood swings.   Morphine  And Codeine Nausea And Vomiting   Statins Other (See Comments)    Myalgias. Severe arthritic response. Tolerates Lovastatin.    Cholestoff [Plant Sterols And Stanols] Other (See Comments)    Muscle aches   Nsaids Other (See Comments)    Aleve ,  Myalgia   Breo Ellipta  [Fluticasone Furoate-Vilanterol]     thrush   Cortisone Hypertension    Patient states cortisone injections he has received for his knees cause hypertension and hyperglycemia   Darvon [Propoxyphene] Other (See Comments)    hallucinate   Doxycycline Cough   Emetrol     Other reaction(s): Hallucinate   Glimepiride Itching   Hydrochlorothiazide     Other reaction(s): Low Blood Pressure   Indomethacin     Neck pain flare   Jardiance  [Empagliflozin ]     hallucinations   Naproxen     Other reaction(s): Increased Liver Count   Penicillins     *positive allergy test*   Repatha  [Evolocumab ] Other (See Comments)    hyperglycemia   Sulfa Antibiotics Nausea And Vomiting   Terbinafine Diarrhea   Tramadol  Other (See Comments)    Hallucinations   [2]  Current Meds  Medication Sig   Acetaminophen  (TYLENOL  ARTHRITIS PAIN PO) Take 650 mg by mouth as needed.   Alirocumab  (PRALUENT ) 75 MG/ML SOAJ INJECT 1ML SUBCUTANEOUSLY EVERY 14 DAYS   aspirin  EC 81 MG tablet Take 81 mg by mouth every morning.   Carboxymethylcellulose Sodium (EYE DROPS OP) Apply to eye as needed (Dry eyes). Thera tears   carvedilol  (COREG ) 25 MG tablet TAKE 1 TABLET (25 MG TOTAL) BY MOUTH TWICE A DAY WITH MEALS   Chlorpheniramine Maleate (CHLOR-TRIMETON ALLERGY PO) Take by mouth as needed (Allergies).   Cholecalciferol (VITAMIN D-3) 125 MCG (5000 UT) TABS Take 5,000 Units by mouth daily.   clopidogrel  (PLAVIX ) 75 MG tablet TAKE 1 TABLET BY MOUTH EVERY DAY   doxazosin  (CARDURA ) 8 MG tablet TAKE 1 TABLET BY MOUTH EVERY DAY   Ferrous Sulfate (IRON) 325 (65 Fe) MG TABS Take 1 tablet by mouth daily in the  afternoon.   isosorbide  mononitrate (IMDUR ) 60 MG 24 hr tablet TAKE 1 TABLET BY MOUTH EVERY DAY   Krill Oil 500 MG CAPS Take 500 mg by mouth at bedtime.   loratadine  (CLARITIN ) 10 MG tablet Take 10 mg by mouth daily.   metFORMIN  (GLUCOPHAGE -XR) 500 MG 24 hr tablet Take 1 tablet (500 mg total) by mouth 2 (two) times daily with breakfast and lunch.   nitroGLYCERIN  (NITROSTAT ) 0.4 MG SL tablet Place 1 tablet (0.4 mg total) under the tongue every 5 (five) minutes x 3 doses as needed for chest pain.   ONETOUCH VERIO test strip 2 (two) times daily.   pantoprazole  (PROTONIX ) 40 MG tablet TAKE 1 TABLET BY MOUTH EVERY DAY   spironolactone  (ALDACTONE ) 25 MG tablet TAKE 1 TABLET (25 MG TOTAL) BY MOUTH DAILY.   valsartan  (DIOVAN ) 80 MG tablet Take 1 tablet (80 mg total) by mouth daily.   "

## 2024-03-11 ENCOUNTER — Encounter: Payer: Self-pay | Admitting: Pulmonary Disease
